# Patient Record
Sex: Female | Born: 1935 | Race: White | Hispanic: No | Smoking: Former smoker
Health system: Southern US, Community
[De-identification: ages and names within clinical notes are randomized; demographics above are authoritative.]

## PROBLEM LIST (undated history)

## (undated) DIAGNOSIS — C801 Malignant (primary) neoplasm, unspecified: Secondary | ICD-10-CM

## (undated) DIAGNOSIS — E079 Disorder of thyroid, unspecified: Secondary | ICD-10-CM

## (undated) DIAGNOSIS — T4145XA Adverse effect of unspecified anesthetic, initial encounter: Secondary | ICD-10-CM

## (undated) DIAGNOSIS — K449 Diaphragmatic hernia without obstruction or gangrene: Secondary | ICD-10-CM

## (undated) DIAGNOSIS — E039 Hypothyroidism, unspecified: Secondary | ICD-10-CM

## (undated) DIAGNOSIS — K219 Gastro-esophageal reflux disease without esophagitis: Secondary | ICD-10-CM

## (undated) DIAGNOSIS — F32A Depression, unspecified: Secondary | ICD-10-CM

## (undated) DIAGNOSIS — H9190 Unspecified hearing loss, unspecified ear: Secondary | ICD-10-CM

## (undated) DIAGNOSIS — I499 Cardiac arrhythmia, unspecified: Secondary | ICD-10-CM

## (undated) DIAGNOSIS — I10 Essential (primary) hypertension: Secondary | ICD-10-CM

## (undated) DIAGNOSIS — T8859XA Other complications of anesthesia, initial encounter: Secondary | ICD-10-CM

## (undated) DIAGNOSIS — Z9889 Other specified postprocedural states: Secondary | ICD-10-CM

## (undated) DIAGNOSIS — R112 Nausea with vomiting, unspecified: Secondary | ICD-10-CM

## (undated) DIAGNOSIS — F329 Major depressive disorder, single episode, unspecified: Secondary | ICD-10-CM

## (undated) HISTORY — DX: Major depressive disorder, single episode, unspecified: F32.9

## (undated) HISTORY — DX: Essential (primary) hypertension: I10

## (undated) HISTORY — DX: Depression, unspecified: F32.A

## (undated) HISTORY — DX: Disorder of thyroid, unspecified: E07.9

## (undated) HISTORY — PX: BACK SURGERY: SHX140

## (undated) HISTORY — PX: BREAST CYST ASPIRATION: SHX578

## (undated) HISTORY — PX: CHOLECYSTECTOMY: SHX55

## (undated) HISTORY — PX: COLON SURGERY: SHX602

## (undated) HISTORY — DX: Diaphragmatic hernia without obstruction or gangrene: K44.9

## (undated) HISTORY — PX: APPENDECTOMY: SHX54

---

## 2005-02-22 ENCOUNTER — Ambulatory Visit: Payer: Self-pay | Admitting: Internal Medicine

## 2006-02-27 ENCOUNTER — Ambulatory Visit: Payer: Self-pay | Admitting: Internal Medicine

## 2006-03-02 ENCOUNTER — Ambulatory Visit: Payer: Self-pay | Admitting: Internal Medicine

## 2006-06-28 ENCOUNTER — Ambulatory Visit: Payer: Self-pay | Admitting: Internal Medicine

## 2006-08-15 ENCOUNTER — Inpatient Hospital Stay (HOSPITAL_COMMUNITY): Admission: RE | Admit: 2006-08-15 | Discharge: 2006-08-17 | Payer: Self-pay | Admitting: Neurosurgery

## 2007-03-07 ENCOUNTER — Ambulatory Visit: Payer: Self-pay | Admitting: Internal Medicine

## 2008-03-19 ENCOUNTER — Ambulatory Visit: Payer: Self-pay | Admitting: Internal Medicine

## 2009-03-23 ENCOUNTER — Ambulatory Visit: Payer: Self-pay | Admitting: Internal Medicine

## 2009-07-08 ENCOUNTER — Encounter: Payer: Self-pay | Admitting: Internal Medicine

## 2009-07-14 ENCOUNTER — Encounter: Payer: Self-pay | Admitting: Internal Medicine

## 2009-09-18 ENCOUNTER — Encounter: Payer: Self-pay | Admitting: Orthopedic Surgery

## 2009-10-14 ENCOUNTER — Encounter: Payer: Self-pay | Admitting: Orthopedic Surgery

## 2009-11-13 ENCOUNTER — Ambulatory Visit: Payer: Self-pay | Admitting: Orthopedic Surgery

## 2010-04-01 ENCOUNTER — Ambulatory Visit: Payer: Self-pay | Admitting: Internal Medicine

## 2011-05-03 ENCOUNTER — Ambulatory Visit: Payer: Self-pay | Admitting: Internal Medicine

## 2011-08-02 ENCOUNTER — Ambulatory Visit: Payer: Self-pay | Admitting: Unknown Physician Specialty

## 2011-08-02 LAB — URINALYSIS, COMPLETE
Bilirubin,UR: NEGATIVE
Blood: NEGATIVE
Ketone: NEGATIVE
Ph: 6 (ref 4.5–8.0)
Protein: NEGATIVE
Specific Gravity: 1.018 (ref 1.003–1.030)
Squamous Epithelial: 9

## 2011-08-02 LAB — BASIC METABOLIC PANEL
Chloride: 101 mmol/L (ref 98–107)
Co2: 25 mmol/L (ref 21–32)
Creatinine: 0.92 mg/dL (ref 0.60–1.30)
Sodium: 139 mmol/L (ref 136–145)

## 2011-08-02 LAB — CBC
HGB: 12.6 g/dL (ref 12.0–16.0)
RBC: 3.87 10*6/uL (ref 3.80–5.20)
WBC: 5.2 10*3/uL (ref 3.6–11.0)

## 2011-08-09 ENCOUNTER — Ambulatory Visit: Payer: Self-pay | Admitting: Unknown Physician Specialty

## 2011-10-14 LAB — COMPREHENSIVE METABOLIC PANEL
Albumin: 3.9 g/dL (ref 3.4–5.0)
Alkaline Phosphatase: 65 U/L (ref 50–136)
Anion Gap: 11 (ref 7–16)
Calcium, Total: 9.6 mg/dL (ref 8.5–10.1)
Creatinine: 0.95 mg/dL (ref 0.60–1.30)
Osmolality: 272 (ref 275–301)
Potassium: 4 mmol/L (ref 3.5–5.1)
SGOT(AST): 26 U/L (ref 15–37)
SGPT (ALT): 20 U/L
Sodium: 135 mmol/L — ABNORMAL LOW (ref 136–145)
Total Protein: 7.2 g/dL (ref 6.4–8.2)

## 2011-10-14 LAB — CBC
MCH: 30.4 pg (ref 26.0–34.0)
MCHC: 32.7 g/dL (ref 32.0–36.0)
RDW: 13.8 % (ref 11.5–14.5)

## 2011-10-14 LAB — TROPONIN I: Troponin-I: <0.02 ng/mL

## 2011-10-15 ENCOUNTER — Inpatient Hospital Stay: Payer: Self-pay | Admitting: Surgery

## 2011-10-15 LAB — URINALYSIS, COMPLETE
Bacteria: NONE SEEN
Leukocyte Esterase: NEGATIVE
Nitrite: NEGATIVE

## 2011-10-15 LAB — AMYLASE: Amylase: 62 U/L (ref 25–115)

## 2011-10-15 LAB — PROTIME-INR: Prothrombin Time: 12.2 secs (ref 11.5–14.7)

## 2011-10-16 LAB — CBC WITH DIFFERENTIAL/PLATELET
Basophil #: 0 10*3/uL (ref 0.0–0.1)
Basophil %: 0.5 %
Basophil: 1 %
Lymphocyte #: 1 10*3/uL (ref 1.0–3.6)
Lymphocyte %: 19.6 %
Lymphocytes: 21 %
MCH: 30.7 pg (ref 26.0–34.0)
MCHC: 32.7 g/dL (ref 32.0–36.0)
Monocyte #: 0.8 x10 3/mm (ref 0.2–0.9)
Monocyte %: 14.7 %
Monocytes: 15 %
Neutrophil #: 3.4 10*3/uL (ref 1.4–6.5)
RBC: 3.88 10*6/uL (ref 3.80–5.20)
RDW: 13.6 % (ref 11.5–14.5)
Variant Lymphocyte - H1-Rlymph: 4 %

## 2011-10-16 LAB — COMPREHENSIVE METABOLIC PANEL
Albumin: 2.9 g/dL — ABNORMAL LOW (ref 3.4–5.0)
Calcium, Total: 8.2 mg/dL — ABNORMAL LOW (ref 8.5–10.1)
Chloride: 98 mmol/L (ref 98–107)
Co2: 23 mmol/L (ref 21–32)
Creatinine: 0.85 mg/dL (ref 0.60–1.30)
EGFR (Non-African Amer.): >60
Glucose: 134 mg/dL — ABNORMAL HIGH (ref 65–99)
Osmolality: 261 (ref 275–301)
SGPT (ALT): 14 U/L
Total Protein: 5.7 g/dL — ABNORMAL LOW (ref 6.4–8.2)

## 2011-10-17 LAB — BASIC METABOLIC PANEL
Anion Gap: 11 (ref 7–16)
BUN: 14 mg/dL (ref 7–18)
Calcium, Total: 8.6 mg/dL (ref 8.5–10.1)
Creatinine: 0.8 mg/dL (ref 0.60–1.30)
Glucose: 105 mg/dL — ABNORMAL HIGH (ref 65–99)
Sodium: 134 mmol/L — ABNORMAL LOW (ref 136–145)

## 2011-10-18 HISTORY — PX: EXPLORATORY LAPAROTOMY: SUR591

## 2011-10-18 LAB — CBC WITH DIFFERENTIAL/PLATELET
Basophil #: 0 10*3/uL (ref 0.0–0.1)
Basophil %: 0.2 %
Eosinophil #: 0 10*3/uL (ref 0.0–0.7)
Eosinophil %: 0.1 %
HCT: 34.2 % — ABNORMAL LOW (ref 35.0–47.0)
HGB: 11.4 g/dL — ABNORMAL LOW (ref 12.0–16.0)
Lymphocyte %: 7.6 %
MCHC: 33.3 g/dL (ref 32.0–36.0)
Monocyte #: 0.7 x10 3/mm (ref 0.2–0.9)
Platelet: 263 10*3/uL (ref 150–440)
RBC: 3.66 10*6/uL — ABNORMAL LOW (ref 3.80–5.20)
RDW: 13.3 % (ref 11.5–14.5)

## 2011-10-18 LAB — COMPREHENSIVE METABOLIC PANEL
Calcium, Total: 8.1 mg/dL — ABNORMAL LOW (ref 8.5–10.1)
Glucose: 125 mg/dL — ABNORMAL HIGH (ref 65–99)
Osmolality: 272 (ref 275–301)
Potassium: 3.8 mmol/L (ref 3.5–5.1)
SGPT (ALT): 16 U/L
Sodium: 135 mmol/L — ABNORMAL LOW (ref 136–145)
Total Protein: 6.1 g/dL — ABNORMAL LOW (ref 6.4–8.2)

## 2011-10-22 LAB — CBC WITH DIFFERENTIAL/PLATELET
Basophil #: 0 10*3/uL (ref 0.0–0.1)
Basophil %: 0.3 %
Eosinophil #: 0.2 10*3/uL (ref 0.0–0.7)
HCT: 29.4 % — ABNORMAL LOW (ref 35.0–47.0)
HGB: 9.6 g/dL — ABNORMAL LOW (ref 12.0–16.0)
Lymphocyte %: 14 %
MCH: 30.1 pg (ref 26.0–34.0)
MCHC: 32.6 g/dL (ref 32.0–36.0)
MCV: 93 fL (ref 80–100)
Monocyte %: 11 %
Neutrophil %: 72.3 %
Platelet: 297 10*3/uL (ref 150–440)
RBC: 3.18 10*6/uL — ABNORMAL LOW (ref 3.80–5.20)

## 2011-10-22 LAB — BASIC METABOLIC PANEL
BUN: 4 mg/dL — ABNORMAL LOW (ref 7–18)
EGFR (African American): >60
Glucose: 104 mg/dL — ABNORMAL HIGH (ref 65–99)
Potassium: 3.6 mmol/L (ref 3.5–5.1)
Sodium: 135 mmol/L — ABNORMAL LOW (ref 136–145)

## 2012-05-03 ENCOUNTER — Ambulatory Visit: Payer: Self-pay | Admitting: Internal Medicine

## 2012-10-01 ENCOUNTER — Other Ambulatory Visit: Payer: Self-pay | Admitting: Urgent Care

## 2012-10-01 LAB — CBC WITH DIFFERENTIAL/PLATELET
Basophil #: 0.1 10*3/uL (ref 0.0–0.1)
Eosinophil #: 0.3 10*3/uL (ref 0.0–0.7)
HCT: 37.8 % (ref 35.0–47.0)
HGB: 12.8 g/dL (ref 12.0–16.0)
Lymphocyte %: 30.9 %
Monocyte #: 0.7 x10 3/mm (ref 0.2–0.9)
Neutrophil %: 51.6 %
RBC: 4.1 10*6/uL (ref 3.80–5.20)

## 2012-10-01 LAB — HEPATIC FUNCTION PANEL A (ARMC)
Albumin: 3.9 g/dL (ref 3.4–5.0)
Alkaline Phosphatase: 79 U/L (ref 50–136)
Bilirubin, Direct: 0.1 mg/dL (ref 0.00–0.20)
Bilirubin,Total: 0.4 mg/dL (ref 0.2–1.0)
SGOT(AST): 25 U/L (ref 15–37)
SGPT (ALT): 19 U/L (ref 12–78)

## 2012-10-01 LAB — LIPASE, BLOOD: Lipase: 176 U/L (ref 73–393)

## 2012-10-18 ENCOUNTER — Ambulatory Visit: Payer: Self-pay | Admitting: Gastroenterology

## 2012-10-19 LAB — PATHOLOGY REPORT

## 2013-02-11 DIAGNOSIS — C44111 Basal cell carcinoma of skin of unspecified eyelid, including canthus: Secondary | ICD-10-CM | POA: Insufficient documentation

## 2013-02-12 ENCOUNTER — Ambulatory Visit: Payer: Self-pay

## 2013-02-13 ENCOUNTER — Emergency Department: Payer: Self-pay | Admitting: Emergency Medicine

## 2013-02-13 LAB — CBC
HCT: 38.6 % (ref 35.0–47.0)
HGB: 13.1 g/dL (ref 12.0–16.0)
MCHC: 34 g/dL (ref 32.0–36.0)
Platelet: 261 10*3/uL (ref 150–440)
RBC: 4.29 10*6/uL (ref 3.80–5.20)
RDW: 15.1 % — ABNORMAL HIGH (ref 11.5–14.5)

## 2013-02-13 LAB — URINALYSIS, COMPLETE
Bacteria: NONE SEEN
Bilirubin,UR: NEGATIVE
Glucose,UR: NEGATIVE mg/dL (ref 0–75)
Leukocyte Esterase: NEGATIVE
Protein: NEGATIVE
RBC,UR: NONE SEEN /HPF (ref 0–5)
Squamous Epithelial: 1
WBC UR: 2 /HPF (ref 0–5)

## 2013-02-13 LAB — COMPREHENSIVE METABOLIC PANEL
Albumin: 3.8 g/dL (ref 3.4–5.0)
Alkaline Phosphatase: 96 U/L (ref 50–136)
Anion Gap: 11 (ref 7–16)
BUN: 14 mg/dL (ref 7–18)
Bilirubin,Total: 0.5 mg/dL (ref 0.2–1.0)
Calcium, Total: 9.8 mg/dL (ref 8.5–10.1)
Chloride: 103 mmol/L (ref 98–107)
Creatinine: 1.09 mg/dL (ref 0.60–1.30)
EGFR (African American): 58 — ABNORMAL LOW
EGFR (Non-African Amer.): 50 — ABNORMAL LOW
Glucose: 93 mg/dL (ref 65–99)
Potassium: 4 mmol/L (ref 3.5–5.1)
SGOT(AST): 32 U/L (ref 15–37)
SGPT (ALT): 28 U/L (ref 12–78)
Sodium: 136 mmol/L (ref 136–145)
Total Protein: 7.2 g/dL (ref 6.4–8.2)

## 2013-02-13 LAB — LIPASE, BLOOD: Lipase: 122 U/L (ref 73–393)

## 2013-02-13 LAB — TROPONIN I: Troponin-I: <0.02 ng/mL

## 2013-05-06 ENCOUNTER — Ambulatory Visit: Payer: Self-pay | Admitting: Internal Medicine

## 2013-06-04 ENCOUNTER — Ambulatory Visit: Payer: Self-pay | Admitting: Surgery

## 2014-05-21 ENCOUNTER — Ambulatory Visit: Payer: Self-pay | Admitting: Internal Medicine

## 2014-09-07 NOTE — Op Note (Signed)
PATIENT NAME:  Tracie Fisher, Tracie Fisher MR#:  [id number] DATE OF BIRTH:  1935-10-23  DATE OF PROCEDURE:  08/09/2011  PREOPERATIVE DIAGNOSIS: Recalcitrant trochanteric bursitis, right hip.   POSTOPERATIVE DIAGNOSIS:  Recalcitrant trochanteric bursitis, right hip.   PROCEDURES:  1. Arthroscopic excision of trochanteric bursa, right hip.  2. Arthroscopic release of tensor fascia lata.   SURGEON: Alysia Penna. Khyrie Masi, M.D.   ASSISTANT: None.   ANESTHESIA: General.   ESTIMATED BLOOD LOSS: Negligible.   COMPLICATIONS: None.   BRIEF CLINICAL NOTE AND PATHOLOGY: The patient had recalcitrant trochanteric bursitis. It did respond to injections temporarily. The options, risks, and benefits were discussed, and she elected to proceed with operative intervention. At the time of the procedure, there was marked thickening and scarring in the bursa. There was also marked tension of the tensor fascia lata.   DESCRIPTION OF PROCEDURE: Preop antibiotics, adequate general anesthesia, left lateral position with a beanbag used. Routine prepping and draping. Appropriate timeout was called.   The greater trochanter was palpated and apex was identified with a spinal needle. Incisions were made proximal and distal to this. The area was injected with Marcaine with epinephrine. The arthroscope was then introduced. The bursa was removed. There was marked irritation of the tensor fascia lata. The bursa was removed with the shaver and the ArthroWand. The tensor fascia lata was then separated carefully with the ArthroWand, allowing it to separate nicely on both sides of the greater trochanter. This was extended proximally and distally. The hip was rotated with no further pressure. The scope was removed. The area was expressed of fluid. The portals were closed with nylon. Aquasol dressings were applied. The patient was awakened and taken to the PAC-U having tolerated the procedure well. Prior to dressing application, the area was again  injected with Marcaine with epinephrine. The patient was taken to the postanesthesia care unit having tolerated the procedure well.  ____________________________ Alysia Penna. Mauri Pole, MD jcc:cbb D: 08/10/2011 05:55:00 ET T: 08/10/2011 09:53:46 ET JOB#: [id number]  cc: Alysia Penna. Mauri Pole, MD, <Dictator> Alysia Penna Laneya Gasaway MD ELECTRONICALLY SIGNED 08/10/2011 21:18

## 2014-09-07 NOTE — H&P (Signed)
PATIENT NAME:  Tracie Fisher, FERCH MR#:  [id number] DATE OF BIRTH:  03/26/1936  DATE OF ADMISSION:  10/15/2011  HISTORY OF PRESENT ILLNESS: Ms. Conti is a 79 year old white female who has a 12 hour history of generalized abdominal pain followed by vomiting. She has vomited consistently for the last 12 hours and continued to vomit in the Emergency Department at [facility]. She also complains of abdominal bloating. This began yesterday afternoon and she had a normal bowel movement yesterday morning (about 20 hours ago). She denies diarrhea, blood in her vomit, blood in her stool, and fever.   Dr. Bary Castilla noted intra-abdominal adhesions from the omentum to the anterior abdominal wall during a laparoscopic cholecystectomy that he performed in December 2001.   PAST SURGICAL HISTORY: 1. Hysterectomy. 2. Appendectomy. 3. Laparoscopic cholecystectomy. 4. Recent right hip bursectomy. 5. Back operations with significant hardware placement.   PAST MEDICAL HISTORY: 1. Hypertension.  2. Hypothyroidism.   MEDICATIONS:  1. Alprazolam 1 mg at bedtime.  2. Metoprolol 50 mg, 1/2 tablet b.i.d.  3. Synthroid 50 mcg q.a.m.  4. Vitamin B12 500 mcg q.a.m.   ALLERGIES: Aspirin, penicillin, tape.  REVIEW OF SYSTEMS: Negative for 10 systems except as mentioned in the history of present illness above. She has no genitourinary symptoms, no hematemesis, no melena, and no fever.   FAMILY HISTORY: Noncontributory.   SOCIAL HISTORY: The patient lives alone. She is divorced. She smoked 3 packs of cigarettes per day but quit 20 years ago. She has an occasional beer. She never worked outside the home.   PHYSICAL EXAMINATION:  GENERAL: An elderly, very quiet, still lady lying in the Emergency Room stretcher.   VITAL SIGNS: Height 5 feet 2 inches, weight 111 pounds, BMI 20.3. Temperature 96.7 degrees Fahrenheit, pulse 70, respirations 18, blood pressure 225/103, oxygen saturation 97% on room air. Repeat blood pressure was  193/83 and 170/76 and 197/83.   HEENT: Pupils equally round and reactive to light. Extraocular movements intact. Sclerae nonicteric. Oropharynx clear. Mucous membranes moist. Hearing intact to voice.   NECK: Supple with no thyromegaly, jugular venous distention, or tracheal deviation.   HEART: Regular rate and rhythm with no murmurs or rubs.   LUNGS: Clear to auscultation with normal respiratory effort bilaterally.   ABDOMEN: Distended but not tympanitic. It is nontender and patient has a small umbilical scar and Pfannenstiel incision scar.   EXTREMITIES: No edema with normal capillary refill bilaterally.   NEUROLOGIC: Motor and sensation are grossly intact.   PSYCHIATRIC: Alert and oriented x4 with appropriate affect.   LABORATORY, DIAGNOSTIC AND RADIOLOGICAL DATA: White blood cell count 11.0, hemoglobin 12, hematocrit 37%, platelet count 333,000. Electrolytes normal. Hepatic profile normal. Urinalysis normal. CT scan of the abdomen and pelvis with IV and oral contrast shows dilated small bowel loops in the abdomen and pelvis with the distal small bowel being completely decompressed. There is a small amount of free fluid in the pelvis. These dilated small bowel loops are not markedly dilated according to my review. There is stool present in the entire right colon, transverse colon and descending colon. This is not dilated but is associated with some gas within the colon. The rectosigmoid is decompressed.   ASSESSMENT: Partial small bowel obstruction versus some other cause for vomiting.   PLAN: Admit to hospital for IV fluid hydration, nasogastric suction, and observation.   ____________________________ Consuela Mimes, MD wfm:cms D: 10/15/2011 05:08:55 ET T: 10/15/2011 09:40:27 ET JOB#: [id number]  cc: Consuela Mimes, MD, <Dictator> Darrick Penna  Willean Schurman MD ELECTRONICALLY SIGNED 10/17/2011 21:45

## 2014-09-07 NOTE — Discharge Summary (Signed)
PATIENT NAME:  Tracie Fisher, Tracie Fisher MR#:  [id number] DATE OF BIRTH:  30-Jan-1936  DATE OF ADMISSION:  10/15/2011 DATE OF DISCHARGE:  10/25/2011  PRINCIPLE DIAGNOSIS: Adhesive small bowel obstruction.   OTHER DIAGNOSES:  1. Hypertension.  2. Hypothyroidism.  3. Status post hysterectomy.  4. Status post appendectomy.  5. Status post laparoscopic cholecystectomy.  PRINCIPLE PROCEDURE PERFORMED DURING THIS ADMISSION: Exploratory laparotomy and extensive enterolysis on 10/18/2011.   HOSPITAL COURSE: [name] was admitted to the hospital and initially treated with nasogastric suction but that came out and she then had some vomiting. She had 4 to 5 little hard balls of constipated bowel movement with Dulcolax suppository and she had lots of stool throughout her colon so she was continued on regimen of enemas and Dulcolax suppositories until the evening of June 4th when she had almost 1000 mL of emesis and bilious nasogastric output. Her abdomen was softer but she essentially had had very little stool with the enemas and Dulcolax suppositories and had passed no flatus. She was taken to the operating room where she underwent the above-mentioned procedure for the above-mentioned findings and postoperatively she had her nasogastric tube removed on the morning of postoperative day three and she continued to progress to discharge on June 11th. She was asked to make an appointment to see me in the office for follow-up and to call in the interim for any problems.   ____________________________ Consuela Mimes, MD wfm:drc D: 11/08/2011 10:16:14 ET T: 11/08/2011 11:29:01 ET JOB#: [id number]  cc: Consuela Mimes, MD, <Dictator>  Consuela Mimes MD ELECTRONICALLY SIGNED 11/11/2011 22:42

## 2014-09-07 NOTE — Op Note (Signed)
PATIENT NAME:  Tracie Fisher, Tracie Fisher MR#:  [id number] DATE OF BIRTH:  1935/08/06  DATE OF PROCEDURE:  10/18/2011  PREOPERATIVE DIAGNOSIS: Small bowel obstruction.   POSTOPERATIVE DIAGNOSIS: Small bowel obstruction, adhesive.   PROCEDURES PERFORMED:  1. Exploratory laparotomy.  2. Extensive enterolysis.   SURGEON: Consuela Mimes, MD  ANESTHESIA: General.   PROCEDURE IN DETAIL: The patient was placed supine on the Operating Room table and prepped and draped in the usual sterile fashion. A lower midline incision was made which was carried superior to the umbilicus and carried down through the subcutaneous tissue and the linea alba with the electrocautery. Upon getting through the linea alba there was immediate transverse colon and omentum. These adhesions were taken down tediously to the anterior abdominal wall and ultimately the peritoneal cavity was entered. All of the omentum was freed up from the pelvis and the transverse colon was reflected superiorly allowing adhesions from loops of small intestine to the anterior abdominal wall, other loops of small intestine, and small bowel mesentery to be lysed with the electrocautery. This was done tediously and there was a particular adhesion in the right lower quadrant that allowed a loop of small intestine to come through it and be obstructed. The obstructed distended probably closed loop of bowel was somewhat purplish in color but was clearly viable. There were no areas of intestine that were nonviable. There are no intestinal injuries. All of these adhesions were taken down and there was one additional adhesion in the mid abdomen near the retroperitoneum that was also problematic. There were some adhesions they were left intact at the terminal ileum between loops of terminal ileum and other loops of terminal ileum. Otherwise, all of the adhesions were taken down to the ligament of Treitz including those to the retroperitoneum and mesenteric base. Once this  had been performed, the small intestine was replaced in its anatomic position taking care to place the jejunum in the left upper quadrant, the mid small bowel in the mid abdomen and the ileum in the right lower quadrant. Three sheets of Seprafilm were placed over the intestine and then the foreshortened previously grossly adherent omentum and transverse colon were draped over top of them. A single piece of Seprafilm was placed between the omentum and transverse colon and the anterior abdominal wall and the linea alba was closed with mass closure technique of running #1 PDS. The subcutaneous tissue was irrigated and the skin was reapproximated with a stapling device and a sterile dressing was applied. Patient tolerated the procedure well. There were no complications.   ____________________________ Consuela Mimes, MD wfm:cms D: 10/18/2011 22:50:45 ET T: 10/19/2011 09:26:58 ET JOB#: [id number]  cc: Consuela Mimes, MD, <Dictator> Consuela Mimes MD ELECTRONICALLY SIGNED 10/19/2011 21:22

## 2015-04-23 ENCOUNTER — Ambulatory Visit
Admission: RE | Admit: 2015-04-23 | Discharge: 2015-04-23 | Disposition: A | Discharge status: Home / Self Care | Payer: Medicare Other | Source: Ambulatory Visit | Attending: Unknown Physician Specialty | Admitting: Unknown Physician Specialty

## 2015-04-23 ENCOUNTER — Other Ambulatory Visit: Payer: Self-pay | Admitting: Unknown Physician Specialty

## 2015-04-23 DIAGNOSIS — R05 Cough: Secondary | ICD-10-CM | POA: Diagnosis not present

## 2015-04-23 DIAGNOSIS — R059 Cough, unspecified: Secondary | ICD-10-CM

## 2015-05-27 ENCOUNTER — Other Ambulatory Visit: Payer: Self-pay | Admitting: Internal Medicine

## 2015-05-27 DIAGNOSIS — Z1231 Encounter for screening mammogram for malignant neoplasm of breast: Secondary | ICD-10-CM

## 2015-06-09 ENCOUNTER — Ambulatory Visit
Admission: RE | Admit: 2015-06-09 | Discharge: 2015-06-09 | Disposition: A | Discharge status: Home / Self Care | Payer: Medicare Other | Source: Ambulatory Visit | Attending: Internal Medicine | Admitting: Internal Medicine

## 2015-06-09 ENCOUNTER — Other Ambulatory Visit: Payer: Self-pay | Admitting: Internal Medicine

## 2015-06-09 DIAGNOSIS — Z1231 Encounter for screening mammogram for malignant neoplasm of breast: Secondary | ICD-10-CM | POA: Insufficient documentation

## 2015-07-28 ENCOUNTER — Ambulatory Visit
Admission: RE | Admit: 2015-07-28 | Discharge: 2015-07-28 | Disposition: A | Discharge status: Home / Self Care | Payer: Medicare Other | Source: Ambulatory Visit | Attending: Internal Medicine | Admitting: Internal Medicine

## 2015-07-28 ENCOUNTER — Other Ambulatory Visit: Payer: Self-pay | Admitting: Internal Medicine

## 2015-07-28 DIAGNOSIS — K449 Diaphragmatic hernia without obstruction or gangrene: Secondary | ICD-10-CM | POA: Diagnosis not present

## 2015-07-28 DIAGNOSIS — R0600 Dyspnea, unspecified: Secondary | ICD-10-CM

## 2015-07-29 DIAGNOSIS — R0602 Shortness of breath: Secondary | ICD-10-CM | POA: Insufficient documentation

## 2015-07-29 DIAGNOSIS — Z87898 Personal history of other specified conditions: Secondary | ICD-10-CM | POA: Insufficient documentation

## 2016-03-23 ENCOUNTER — Inpatient Hospital Stay: Payer: Medicare Other

## 2016-03-23 ENCOUNTER — Encounter: Payer: Self-pay | Admitting: Internal Medicine

## 2016-03-23 ENCOUNTER — Encounter (INDEPENDENT_AMBULATORY_CARE_PROVIDER_SITE_OTHER): Payer: Self-pay

## 2016-03-23 ENCOUNTER — Inpatient Hospital Stay: Payer: Medicare Other | Attending: Internal Medicine | Admitting: Internal Medicine

## 2016-03-23 DIAGNOSIS — K5730 Diverticulosis of large intestine without perforation or abscess without bleeding: Secondary | ICD-10-CM | POA: Insufficient documentation

## 2016-03-23 DIAGNOSIS — D500 Iron deficiency anemia secondary to blood loss (chronic): Secondary | ICD-10-CM | POA: Diagnosis present

## 2016-03-23 DIAGNOSIS — Z803 Family history of malignant neoplasm of breast: Secondary | ICD-10-CM | POA: Diagnosis not present

## 2016-03-23 DIAGNOSIS — R42 Dizziness and giddiness: Secondary | ICD-10-CM | POA: Diagnosis not present

## 2016-03-23 DIAGNOSIS — K449 Diaphragmatic hernia without obstruction or gangrene: Secondary | ICD-10-CM | POA: Insufficient documentation

## 2016-03-23 DIAGNOSIS — Z8041 Family history of malignant neoplasm of ovary: Secondary | ICD-10-CM | POA: Diagnosis not present

## 2016-03-23 DIAGNOSIS — R1084 Generalized abdominal pain: Secondary | ICD-10-CM | POA: Insufficient documentation

## 2016-03-23 DIAGNOSIS — D509 Iron deficiency anemia, unspecified: Secondary | ICD-10-CM | POA: Insufficient documentation

## 2016-03-23 DIAGNOSIS — R5383 Other fatigue: Secondary | ICD-10-CM | POA: Insufficient documentation

## 2016-03-23 DIAGNOSIS — F329 Major depressive disorder, single episode, unspecified: Secondary | ICD-10-CM | POA: Diagnosis not present

## 2016-03-23 DIAGNOSIS — E079 Disorder of thyroid, unspecified: Secondary | ICD-10-CM

## 2016-03-23 DIAGNOSIS — I10 Essential (primary) hypertension: Secondary | ICD-10-CM | POA: Diagnosis not present

## 2016-03-23 DIAGNOSIS — M542 Cervicalgia: Secondary | ICD-10-CM | POA: Insufficient documentation

## 2016-03-23 DIAGNOSIS — Z79899 Other long term (current) drug therapy: Secondary | ICD-10-CM | POA: Insufficient documentation

## 2016-03-23 DIAGNOSIS — Z87891 Personal history of nicotine dependence: Secondary | ICD-10-CM | POA: Insufficient documentation

## 2016-03-23 DIAGNOSIS — R531 Weakness: Secondary | ICD-10-CM | POA: Diagnosis not present

## 2016-03-23 DIAGNOSIS — R1013 Epigastric pain: Secondary | ICD-10-CM | POA: Diagnosis not present

## 2016-03-23 DIAGNOSIS — R1010 Upper abdominal pain, unspecified: Secondary | ICD-10-CM | POA: Diagnosis not present

## 2016-03-23 DIAGNOSIS — K921 Melena: Secondary | ICD-10-CM | POA: Insufficient documentation

## 2016-03-23 LAB — CBC WITH DIFFERENTIAL/PLATELET
BASOS ABS: 0.1 10*3/uL (ref 0–0.1)
BASOS PCT: 1 %
EOS ABS: 0.2 10*3/uL (ref 0–0.7)
EOS PCT: 3 %
HCT: 26.6 % — ABNORMAL LOW (ref 35.0–47.0)
Hemoglobin: 8 g/dL — ABNORMAL LOW (ref 12.0–16.0)
Lymphocytes Relative: 45 %
Lymphs Abs: 3.3 10*3/uL (ref 1.0–3.6)
MCH: 21.2 pg — ABNORMAL LOW (ref 26.0–34.0)
MCHC: 30 g/dL — AB (ref 32.0–36.0)
MCV: 70.6 fL — ABNORMAL LOW (ref 80.0–100.0)
MONO ABS: 0.7 10*3/uL (ref 0.2–0.9)
MONOS PCT: 9 %
NEUTROS ABS: 3.1 10*3/uL (ref 1.4–6.5)
Neutrophils Relative %: 42 %
PLATELETS: 437 10*3/uL (ref 150–440)
RBC: 3.77 MIL/uL — ABNORMAL LOW (ref 3.80–5.20)
RDW: 19.2 % — AB (ref 11.5–14.5)
WBC: 7.4 10*3/uL (ref 3.6–11.0)

## 2016-03-23 LAB — IRON AND TIBC
IRON: 12 ug/dL — AB (ref 28–170)
SATURATION RATIOS: 2 % — AB (ref 10.4–31.8)
TIBC: 588 ug/dL — ABNORMAL HIGH (ref 250–450)
UIBC: 576 ug/dL

## 2016-03-23 LAB — FERRITIN: FERRITIN: 4 ng/mL — AB (ref 11–307)

## 2016-03-23 MED ORDER — FERROUS SULFATE 325 (65 FE) MG PO TBEC: 325.0000 mg | DELAYED_RELEASE_TABLET | Freq: Three times a day (TID) | ORAL | 3 refills | Status: DC

## 2016-03-23 NOTE — Progress Notes (Addendum)
[place]  [phone] [phone]  ID: Tracie Fisher OB: [id number]  MR#: [id number]  [id number]  Patient Care Team: Albina Billet, MD as PCP - General (Internal Medicine)  CHIEF COMPLAINT: New Evaluation and Anemia    INTERVAL HISTORY: This is a 80 yr old lady who was noted to have a Hgb of 7.1 by her PCP. She has been progressively more tired, and had been feelign lightheaded. She was seen by cardiology and was noted to have no identifiable cardiac causes to explain her symptoms. She den\es any bleeding, she has had long standing dyspeptic symptoms. No loss of weight, she eats small  Frequent meals.  REVIEW OF SYSTEMS:   Review of Systems  Constitutional: Positive for malaise/fatigue. Negative for chills, diaphoresis, fever and weight loss.  HENT: Positive for hearing loss. Negative for congestion, ear discharge, ear pain, sinus pain and tinnitus.   Gastrointestinal: Positive for abdominal pain and heartburn. Negative for blood in stool, constipation, diarrhea and melena.  Genitourinary: Negative for dysuria, flank pain, frequency, hematuria and urgency.  Musculoskeletal: Positive for neck pain. Negative for myalgias.  Skin: Negative for itching and rash.  Neurological: Positive for weakness. Negative for dizziness, tingling, tremors, sensory change, speech change, focal weakness, seizures, loss of consciousness and headaches.  Endo/Heme/Allergies: Negative for environmental allergies and polydipsia. Does not bruise/bleed easily.  Psychiatric/Behavioral: Negative for depression, hallucinations, memory loss, substance abuse and suicidal ideas. The patient is not nervous/anxious and does not have insomnia.     As per HPI. Otherwise, a complete review of systems is negative.  PAST MEDICAL HISTORY: Past Medical History:  Diagnosis Date  . Depression   . Hypertension   . Thyroid disease     PAST SURGICAL HISTORY: Past Surgical  History:  Procedure Laterality Date  . BREAST CYST ASPIRATION Left "years ago"   Intetstinal resection LUMBAR DISC FUSION,  CERVICAL spine surgery Bladder tuck with mesh  FAMILY HISTORY: Family History  Problem Relation Age of Onset  . Breast cancer Sister 44  . Heart attack Brother   . Heart disease Father   Ovarian cancer sister No colon cancer in family  ADVANCED DIRECTIVES (Y/N):  N  HEALTH MAINTENANCE: Social History  Substance Use Topics  . Smoking status: Former Smoker    Years: 20.00  . Smokeless tobacco: Never Used  . Alcohol use Not on file     Allergies  Allergen Reactions  . Aspirin Itching  . Penicillin G Rash    Current Outpatient Prescriptions  Medication Sig Dispense Refill  . ALPRAZolam (XANAX) 1 MG tablet Take by mouth.    . carvedilol (COREG) 6.25 MG tablet Take by mouth.    . cyanocobalamin (V-R VITAMIN B-12) 500 MCG tablet Take by mouth.    . DULoxetine (CYMBALTA) 30 MG capsule Take by mouth.    . levothyroxine (SYNTHROID, LEVOTHROID) 50 MCG tablet Take by mouth.    . losartan (COZAAR) 50 MG tablet Take by mouth.    . triamcinolone ointment (KENALOG) 0.1 % Apply topically.    . ferrous sulfate 325 (65 FE) MG EC tablet Take 1 tablet (325 mg total) by mouth 3 (three) times daily with meals. 60 tablet 3   No current facility-administered medications for this visit.     OBJECTIVE: Vitals:   03/23/16 1342  BP: (!) 187/75  Pulse: 61  Resp: 18  Temp: 97.1 F (36.2 C)     Body mass index is 23.7 kg/m.   1.67 meters squared  ECOG FS:1 - Symptomatic but completely ambulatory  Physical Exam  Constitutional: She is oriented to person, place, and time. She appears well-developed and well-nourished.  HENT:  Head: Normocephalic and atraumatic.  Eyes: EOM are normal. Pupils are equal, round, and reactive to light.  Neck: Normal range of motion. Neck supple. No tracheal deviation present. No thyromegaly present.  Cardiovascular: Normal rate.     Pulmonary/Chest: Effort normal and breath sounds normal.  Abdominal: Soft. She exhibits no distension. There is no tenderness. There is no guarding.  Musculoskeletal: Normal range of motion. She exhibits no edema or deformity.  Lymphadenopathy:    She has no cervical adenopathy.  Neurological: She is alert and oriented to person, place, and time. No cranial nerve deficit.  Skin: No rash noted. There is pallor.  Psychiatric: She has a normal mood and affect. Her behavior is normal.       LAB RESULTS:  Lab Results  Component Value Date   NA 136 02/13/2013   K 4.0 02/13/2013   CL 103 02/13/2013   CO2 22 02/13/2013   GLUCOSE 93 02/13/2013   BUN 14 02/13/2013   CREATININE 1.09 02/13/2013   CALCIUM 9.8 02/13/2013   PROT 7.2 02/13/2013   ALBUMIN 3.8 02/13/2013   AST 32 02/13/2013   ALT 28 02/13/2013   ALKPHOS 96 02/13/2013   BILITOT 0.5 02/13/2013   GFRNONAA 50 (L) 02/13/2013   GFRAA 58 (L) 02/13/2013    Lab Results  Component Value Date   WBC 7.4 03/23/2016   NEUTROABS 3.1 03/23/2016   HGB 8.0 (L) 03/23/2016   HCT 26.6 (L) 03/23/2016   MCV 70.6 (L) 03/23/2016   PLT 437 03/23/2016          LABORATORY DATA:  CBC from  10//2017 showed a hemoglobin of 7.1 with an MCV of 73 normal Wbc  White cell differential appeared to be normal. 123456 was 629,folic acid was greater than 20  Liver function tests from earlier labs April 017 was reported normal.      PLAN:  Iron deficiency aneia. Source of bood loss not clear. She may also have a malabsoprtion of Iron She gives a history of colonic resection a few years ago reports of that are not evaluable she also says she had a colonoscopy however I cannot see any reports on the Epic.  I did see a surgical path report from 2014 that showed chronic gastritis and a benign polyp reported. I will refer her to GI for a complete workup.  She is unsure if she can tolerate oral iron nevertheless we'll try with the iron sulfate 325 mg  once daily and increase it to 2 and 3 eventually as she tolerates. If she is unable to tolerate have asked her to give [place] a call back when I would be able to arrange for IV Venofer infusions. I have discussed the potential side effects of Iv iron.  She agrees to the plan.Iron panel has been requested today.  We'll search  for records of colonoscopy and the abdominal surgery.  Chronic persistent upper abdominal pain and dyspepsia: She has severe dyspepsia most likely as a result of the large hiatal hernia that was noted on a previous CT scan. Will request a CT scan of the abdomen pelvis the last scan from 2016 showed various nonspecific abnormalities but given her persistent pain and now the iron deficiency, I would like to make sure there is no intraluminal pathology to suspect bleeding.   Patient expressed understanding and  was in agreement with this plan. She also understands that She can call clinic at any time with any questions, concerns, or complaints.  Orders Placed This Encounter  Procedures  . CT ABDOMEN PELVIS W CONTRAST    Standing Status:   Future    Standing Expiration Date:   03/23/2017    Order Specific Question:   Reason for Exam (SYMPTOM  OR DIAGNOSIS REQUIRED)    Answer:   ABDOMINAL PAIN, anemia, h/o intestinal resrction, hiatal hernia    Order Specific Question:   Preferred imaging location?    Answer:   [facility]  . Iron and TIBC    Standing Status:   Future    Number of Occurrences:   1    Standing Expiration Date:   03/23/2017  . Ferritin    Standing Status:   Future    Number of Occurrences:   1    Standing Expiration Date:   03/23/2017  . CBC with Differential    Standing Status:   Future    Number of Occurrences:   1    Standing Expiration Date:   03/23/2017  . Ambulatory referral to Gastroenterology    Referral Priority:   Routine    Referral Type:   Consultation    Referral Reason:   Specialty Services Required    Number of Visits Requested:   1     Return in about 2 weeks (around 04/06/2016). No matching staging information was found for the patient.   This note was generated in part with voice recognition software and I apologize for any typographical errors that were not detected and corrected.    Creola Corn, MD   03/28/2016 10:02 AM   ADDENDUM: 03/28/2016 [id number] I called Tracie Fisher today by phone and she reports that her stomach has been burning continuously since  she began oral iron. She can no longer take it. Therefore, I have ordered IV Venofer to start today. She understands the side effects and benefits. As we discussed at her last visit.

## 2016-03-23 NOTE — Progress Notes (Signed)
Patient was evaluated by PCP for weakness with syncope episodes and her hemoglobin was low on labs.

## 2016-03-24 ENCOUNTER — Telehealth: Payer: Self-pay | Admitting: *Deleted

## 2016-03-24 MED ORDER — FERROUS SULFATE 325 (65 FE) MG PO TBEC: 325.0000 mg | DELAYED_RELEASE_TABLET | Freq: Three times a day (TID) | ORAL | 3 refills | Status: DC

## 2016-03-24 NOTE — Telephone Encounter (Signed)
Pharmacy did not receive prescription.

## 2016-03-28 ENCOUNTER — Inpatient Hospital Stay: Payer: Medicare Other

## 2016-03-28 VITALS — BP 178/82 | HR 69 | Temp 97.3°F | Resp 20

## 2016-03-28 DIAGNOSIS — D500 Iron deficiency anemia secondary to blood loss (chronic): Secondary | ICD-10-CM | POA: Diagnosis not present

## 2016-03-28 MED ORDER — SODIUM CHLORIDE 0.9 % IV SOLN: 200.0000 mg | Freq: Once | INTRAVENOUS | Status: DC

## 2016-03-28 MED ORDER — IRON SUCROSE 20 MG/ML IV SOLN
200.0000 mg | Freq: Once | INTRAVENOUS | Status: AC
  Administered 2016-03-28: 200 mg via INTRAVENOUS
  Filled 2016-03-28: qty 10

## 2016-03-28 MED ORDER — SODIUM CHLORIDE 0.9 % IV SOLN
Freq: Once | INTRAVENOUS | Status: AC
  Administered 2016-03-28: 14:00:00 via INTRAVENOUS
  Filled 2016-03-28: qty 1000

## 2016-03-30 ENCOUNTER — Inpatient Hospital Stay: Payer: Medicare Other

## 2016-03-30 VITALS — BP 167/89 | HR 67 | Temp 96.1°F | Resp 20

## 2016-03-30 DIAGNOSIS — D500 Iron deficiency anemia secondary to blood loss (chronic): Secondary | ICD-10-CM

## 2016-03-30 MED ORDER — SODIUM CHLORIDE 0.9 % IV SOLN: 200.0000 mg | Freq: Once | INTRAVENOUS | Status: DC

## 2016-03-30 MED ORDER — SODIUM CHLORIDE 0.9 % IV SOLN
Freq: Once | INTRAVENOUS | Status: AC
  Administered 2016-03-30: 20 mL/h via INTRAVENOUS
  Filled 2016-03-30: qty 1000

## 2016-03-30 MED ORDER — IRON SUCROSE 20 MG/ML IV SOLN
200.0000 mg | Freq: Once | INTRAVENOUS | Status: AC
  Administered 2016-03-30: 200 mg via INTRAVENOUS
  Filled 2016-03-30: qty 10

## 2016-03-31 ENCOUNTER — Ambulatory Visit
Admission: RE | Admit: 2016-03-31 | Discharge: 2016-03-31 | Disposition: A | Discharge status: Home / Self Care | Payer: Medicare Other | Source: Ambulatory Visit | Attending: Internal Medicine | Admitting: Internal Medicine

## 2016-03-31 DIAGNOSIS — I700 Atherosclerosis of aorta: Secondary | ICD-10-CM | POA: Diagnosis not present

## 2016-03-31 DIAGNOSIS — R1010 Upper abdominal pain, unspecified: Secondary | ICD-10-CM | POA: Diagnosis present

## 2016-03-31 DIAGNOSIS — D500 Iron deficiency anemia secondary to blood loss (chronic): Secondary | ICD-10-CM | POA: Insufficient documentation

## 2016-03-31 DIAGNOSIS — K5730 Diverticulosis of large intestine without perforation or abscess without bleeding: Secondary | ICD-10-CM | POA: Insufficient documentation

## 2016-03-31 DIAGNOSIS — Z9049 Acquired absence of other specified parts of digestive tract: Secondary | ICD-10-CM | POA: Insufficient documentation

## 2016-03-31 LAB — POCT I-STAT CREATININE: CREATININE: 0.9 mg/dL (ref 0.44–1.00)

## 2016-03-31 MED ORDER — IOPAMIDOL (ISOVUE-300) INJECTION 61%
100.0000 mL | Freq: Once | INTRAVENOUS | Status: AC | PRN
  Administered 2016-03-31: 100 mL via INTRAVENOUS

## 2016-04-01 ENCOUNTER — Inpatient Hospital Stay: Payer: Medicare Other

## 2016-04-04 ENCOUNTER — Inpatient Hospital Stay: Payer: Medicare Other

## 2016-04-06 ENCOUNTER — Inpatient Hospital Stay (HOSPITAL_BASED_OUTPATIENT_CLINIC_OR_DEPARTMENT_OTHER): Payer: Medicare Other | Admitting: Internal Medicine

## 2016-04-06 ENCOUNTER — Inpatient Hospital Stay: Payer: Medicare Other | Admitting: *Deleted

## 2016-04-06 ENCOUNTER — Inpatient Hospital Stay: Payer: Medicare Other

## 2016-04-06 VITALS — BP 194/77 | HR 79 | Temp 98.5°F | Resp 18 | Wt 133.7 lb

## 2016-04-06 VITALS — BP 158/88 | HR 60 | Resp 18

## 2016-04-06 DIAGNOSIS — D500 Iron deficiency anemia secondary to blood loss (chronic): Secondary | ICD-10-CM | POA: Diagnosis not present

## 2016-04-06 LAB — CBC WITH DIFFERENTIAL/PLATELET
BASOS ABS: 0.1 10*3/uL (ref 0–0.1)
Basophils Relative: 2 %
EOS PCT: 3 %
Eosinophils Absolute: 0.2 10*3/uL (ref 0–0.7)
HCT: 29.3 % — ABNORMAL LOW (ref 35.0–47.0)
HEMOGLOBIN: 9 g/dL — AB (ref 12.0–16.0)
LYMPHS ABS: 2.2 10*3/uL (ref 1.0–3.6)
Lymphocytes Relative: 36 %
MCH: 24.4 pg — ABNORMAL LOW (ref 26.0–34.0)
MCHC: 30.7 g/dL — ABNORMAL LOW (ref 32.0–36.0)
MCV: 79.4 fL — AB (ref 80.0–100.0)
Monocytes Absolute: 0.7 10*3/uL (ref 0.2–0.9)
Monocytes Relative: 11 %
NEUTROS PCT: 48 %
Neutro Abs: 3 10*3/uL (ref 1.4–6.5)
PLATELETS: 357 10*3/uL (ref 150–440)
RBC: 3.69 MIL/uL — AB (ref 3.80–5.20)
RDW: 33.2 % — ABNORMAL HIGH (ref 11.5–14.5)
WBC: 6.2 10*3/uL (ref 3.6–11.0)

## 2016-04-06 LAB — FERRITIN: FERRITIN: 113 ng/mL (ref 11–307)

## 2016-04-06 MED ORDER — SODIUM CHLORIDE 0.9 % IV SOLN
Freq: Once | INTRAVENOUS | Status: AC
  Administered 2016-04-06: 15:00:00 via INTRAVENOUS
  Filled 2016-04-06: qty 1000

## 2016-04-06 MED ORDER — SODIUM CHLORIDE 0.9 % IV SOLN: 200.0000 mg | Freq: Once | INTRAVENOUS | Status: DC

## 2016-04-06 MED ORDER — IRON SUCROSE 20 MG/ML IV SOLN
200.0000 mg | Freq: Once | INTRAVENOUS | Status: AC
  Administered 2016-04-06: 200 mg via INTRAVENOUS
  Filled 2016-04-06: qty 10

## 2016-04-06 NOTE — Progress Notes (Signed)
Complains of chronic stomach pain. States is feeling better after 2 doses of venofer.

## 2016-04-12 ENCOUNTER — Other Ambulatory Visit
Admission: RE | Admit: 2016-04-12 | Discharge: 2016-04-12 | Disposition: A | Discharge status: Home / Self Care | Payer: Medicare Other | Source: Ambulatory Visit | Attending: Gastroenterology | Admitting: Gastroenterology

## 2016-04-12 ENCOUNTER — Encounter: Payer: Self-pay | Admitting: Gastroenterology

## 2016-04-12 ENCOUNTER — Other Ambulatory Visit: Payer: Self-pay

## 2016-04-12 ENCOUNTER — Ambulatory Visit (INDEPENDENT_AMBULATORY_CARE_PROVIDER_SITE_OTHER): Payer: Medicare Other | Admitting: Gastroenterology

## 2016-04-12 VITALS — BP 180/77 | HR 65 | Temp 97.8°F | Ht 63.0 in | Wt 135.4 lb

## 2016-04-12 DIAGNOSIS — R935 Abnormal findings on diagnostic imaging of other abdominal regions, including retroperitoneum: Secondary | ICD-10-CM

## 2016-04-12 DIAGNOSIS — R1012 Left upper quadrant pain: Secondary | ICD-10-CM | POA: Diagnosis present

## 2016-04-12 DIAGNOSIS — D508 Other iron deficiency anemias: Secondary | ICD-10-CM

## 2016-04-12 LAB — FOLATE: FOLATE: 26 ng/mL (ref >5.9)

## 2016-04-12 LAB — VITAMIN B12: Vitamin B-12: 418 pg/mL (ref 180–914)

## 2016-04-12 MED ORDER — OMEPRAZOLE 40 MG PO CPDR: 40.0000 mg | DELAYED_RELEASE_CAPSULE | Freq: Every day | ORAL | 0 refills | Status: DC

## 2016-04-12 MED ORDER — PEG 3350-KCL-NABCB-NACL-NASULF 236 G PO SOLR: ORAL | 0 refills | Status: DC

## 2016-04-12 NOTE — Progress Notes (Signed)
Gastroenterology Consultation  Referring Provider:     Albina Billet, MD Primary Care Physician:  Albina Billet, MD Primary Gastroenterologist:  Dr. Jonathon Bellows  Reason for Consultation:     Anemia,abdominal pain         HPI:   Tracie Fisher is a 80 y.o. y/o female referred for consultation & management  by Dr. Albina Billet, MD.   She was recently seen by Dr Sherrine Maples for anemia . She has been commenced on IV iron .  Abdominal pain: Onset: many years, no worse , every day , once daily , lasts till she goes to bed then it quits Site :left side of her abdomen  Radiation: localized Petra Kuba of pain: burning  Aggravating factors: nothing  Relieving factors :going to bed  Weight loss: yes- 3 lbs over a few months NSAID use: no  PPI use :no  Gall bladder surgery: yes many years back.  Frequency of bowel movements: mostly every day  Change in bowel movements: no  Relief with bowel movements: unsure  Gas/Bloating/Abdominal distension: unsure.  She says she has a blockage in her small intestines 2-3 years back and had surgery. She recalls her colonoscopy was also 2-3 years back and recalls she had a small polyp.    Denies any overt blood loss. No hematemesis, no vaginal blood loss. No blood in her urine. No family history of colon cancer.    Anemia: 03/23/2016 Hb 8, MCV 70 ,ferritin 4. No blood in urine.   CT abdomen: CBD 8.4 mm , mild sigmoid diverticulosis/.    Past Medical History:  Diagnosis Date  . Depression   . Hypertension   . Thyroid disease     Past Surgical History:  Procedure Laterality Date  . BREAST CYST ASPIRATION Left "years ago"    Prior to Admission medications   Medication Sig Start Date End Date Taking? Authorizing Provider  ALPRAZolam Duanne Moron) 1 MG tablet Take 1 mg by mouth 2 (two) times daily.    Yes Historical Provider, MD  cyanocobalamin (V-R VITAMIN B-12) 500 MCG tablet Take 500 mcg by mouth daily.    Yes Historical Provider, MD  DULoxetine  (CYMBALTA) 30 MG capsule Take 30 mg by mouth daily.    Yes Historical Provider, MD  levothyroxine (SYNTHROID, LEVOTHROID) 50 MCG tablet Take 50 mcg by mouth daily before breakfast.    Yes Historical Provider, MD  losartan (COZAAR) 50 MG tablet Take 50 mg by mouth daily.  03/02/16 03/02/17 Yes Historical Provider, MD  metoprolol tartrate (LOPRESSOR) 25 MG tablet Take 1 tablet by mouth 2 (two) times daily. 04/01/16 04/01/17 Yes Historical Provider, MD    Family History  Problem Relation Age of Onset  . Breast cancer Sister 29  . Heart attack Brother   . Heart disease Father      Social History  Substance Use Topics  . Smoking status: Former Smoker    Years: 20.00  . Smokeless tobacco: Never Used  . Alcohol use Not on file    Allergies as of 04/12/2016 - Review Complete 04/12/2016  Allergen Reaction Noted  . Aspirin Itching 02/11/2013  . Penicillin g Rash 07/29/2015    Review of Systems:    All systems reviewed and negative except where noted in HPI.   Physical Exam:  BP (!) 180/77   Pulse 65   Temp 97.8 F (36.6 C) (Oral)   Ht 5\' 3"  (1.6 m)   Wt 135 lb 6.4 oz (61.4 kg)   BMI 23.99  kg/m  No LMP recorded. Patient is postmenopausal. Psych:  Alert and cooperative. Normal mood and affect. General:   Alert,  Well-developed, well-nourished, pleasant and cooperative in NAD Head:  Normocephalic and atraumatic. Eyes:  Sclera clear, no icterus.   Conjunctiva pink. Ears:  Normal auditory acuity. Nose:  No deformity, discharge, or lesions. Mouth:  No deformity or lesions,oropharynx pink & moist. Neck:  Supple; no masses or thyromegaly. Lungs:  Respirations even and unlabored.  Clear throughout to auscultation.   No wheezes, crackles, or rhonchi. No acute distress. Heart:  Regular rate and rhythm; no murmurs, clicks, rubs, or gallops. Abdomen: mid line scar,   Normal bowel sounds.  No bruits.  Soft, non-tender and non-distended without masses, hepatosplenomegaly or hernias noted.   No guarding or rebound tenderness.    Psych:  Alert and cooperative. Normal mood and affect.  Imaging Studies: Ct Abdomen Pelvis W Contrast  Result Date: 03/31/2016 CLINICAL DATA:  Nine deficiency anemia. Left abdominal pain and burning sensation for the past 7 years. History of hiatal hernia. Ex-smoker. EXAM: CT ABDOMEN AND PELVIS WITH CONTRAST TECHNIQUE: Multidetector CT imaging of the abdomen and pelvis was performed using the standard protocol following bolus administration of intravenous contrast. CONTRAST:  157mL ISOVUE-300 IOPAMIDOL (ISOVUE-300) INJECTION 61% COMPARISON:  06/04/2013. FINDINGS: Lower chest: Minimal bibasilar linear atelectasis or scarring. Hepatobiliary: Cholecystectomy clips. Mild intrahepatic biliary ductal dilatation with progression. The common duct measures 8.4 mm in maximum diameter. Pancreas: Unremarkable. No pancreatic ductal dilatation or surrounding inflammatory changes. Spleen: Normal in size without focal abnormality. Adrenals/Urinary Tract: Normal appearing adrenal glands. Stable upper pole left renal cysts. Normal appearing right kidney. No bladder or ureteral abnormalities. No urinary tract calculi or hydronephrosis. Stomach/Bowel: Mild sigmoid colon diverticulosis. Moderately large hiatal hernia. Unremarkable small bowel. No evidence of appendicitis. Vascular/Lymphatic: Atheromatous arterial calcifications, including the abdominal aorta. No enlarged lymph nodes. Reproductive: Surgically absent uterus.  No adnexal masses. Other: None. Musculoskeletal: Extensive lumbar and upper sacral fixation hardware and laminectomy defects. Bone harvesting defects in both iliac bones. IMPRESSION: 1. Interval mild biliary ductal dilatation. This is within normal limits for a 80 year old patient post cholecystectomy. 2. Mild sigmoid colon diverticulosis. 3. Aortic atherosclerosis. Electronically Signed   By: Claudie Revering M.D.   On: 03/31/2016 18:53    Assessment and Plan:   Tracie Fisher is a 80 y.o. y/o female has been referred for iron deficiency anemia, abdominal pain .  1. Abdominal pain: Long standing. Trial of PPI and obtain MRCP.  2. Dilated common bile duct , with a history of anemia and abdominal pain : Will obtain MRCP, LFT . May be related to post operative changes.  3. Iron deficiency anemia: EGD+colonoscopy +/- capsule study ,celiac serology , b12,folate levels.   Follow up in 8 weeks   Dr Jonathon Bellows MD

## 2016-04-13 ENCOUNTER — Telehealth: Payer: Self-pay | Admitting: Gastroenterology

## 2016-04-13 ENCOUNTER — Ambulatory Visit: Payer: Medicare Other

## 2016-04-13 LAB — CELIAC DISEASE PANEL
Endomysial Ab, IgA: NEGATIVE
IGA: 203 mg/dL (ref 64–422)

## 2016-04-13 NOTE — Telephone Encounter (Signed)
Patient would like to talk to you regarding iron infusions that she taking. She is scheduled for a colonoscopy

## 2016-04-13 NOTE — Telephone Encounter (Signed)
Advised pt she can do ahead and have the iron infusion today as her colonoscopy is 04/25/16.

## 2016-04-14 ENCOUNTER — Other Ambulatory Visit
Admission: RE | Admit: 2016-04-14 | Discharge: 2016-04-14 | Disposition: A | Discharge status: Home / Self Care | Payer: Medicare Other | Source: Ambulatory Visit | Attending: Gastroenterology | Admitting: Gastroenterology

## 2016-04-14 DIAGNOSIS — R1012 Left upper quadrant pain: Secondary | ICD-10-CM | POA: Insufficient documentation

## 2016-04-14 NOTE — Progress Notes (Signed)
CC Tracie Fisher returns today for f/u for IDA.  HPI: She was intolerant to oral Iron, therefore received IV iron, she is due for her third dose today  She reports some improvemtn in fatigue already and has had no side effcts from oral Iron.  Her vitals are noted to be stable today as below:  Blood pressure (!) 194/77, pulse 79, temperature 98.5 F (36.9 C), temperature source Tympanic, resp. rate 18, weight 133 lb 11.3 oz (60.6 kg).   Exam shows her to be in no acute distress. Cbc today shows improved hgb to 9 from 8, all else stable B12 requested  Impression and Plan: IDA - improving. CT abd and pelvis dated 03/31/2016 reveleld hiatal hernia otherwise stable Mild sigmoid colon diverticulosis. Moderately large hiatal hernia. Unremarkable small bowel. No evidence of Appendicitis. However there was mild biliary ductal dilatation  GI evaluation for chronic dyspepsia, and occutl blood loss work up is pending. Proceed with Iv iron today as planned Return in 6 weeks with repeat labs and f/u with MD Keep f/u with GI.  Orders Placed This Encounter  Procedures  . CBC with Differential  . Ferritin  . Iron and TIBC

## 2016-04-16 LAB — H. PYLORI ANTIGEN, STOOL: H. PYLORI STOOL AG, EIA: NEGATIVE

## 2016-04-20 ENCOUNTER — Ambulatory Visit: Payer: Medicare Other

## 2016-04-25 ENCOUNTER — Ambulatory Visit
Admission: RE | Admit: 2016-04-25 | Discharge: 2016-04-25 | Disposition: A | Discharge status: Home / Self Care | Payer: Medicare Other | Source: Ambulatory Visit | Attending: Gastroenterology | Admitting: Gastroenterology

## 2016-04-25 ENCOUNTER — Ambulatory Visit: Payer: Medicare Other | Admitting: Anesthesiology

## 2016-04-25 ENCOUNTER — Encounter
Admission: RE | Disposition: A | Discharge status: Home / Self Care | Payer: Self-pay | Source: Ambulatory Visit | Attending: Gastroenterology

## 2016-04-25 DIAGNOSIS — K5730 Diverticulosis of large intestine without perforation or abscess without bleeding: Secondary | ICD-10-CM | POA: Insufficient documentation

## 2016-04-25 DIAGNOSIS — Z8249 Family history of ischemic heart disease and other diseases of the circulatory system: Secondary | ICD-10-CM | POA: Diagnosis not present

## 2016-04-25 DIAGNOSIS — D122 Benign neoplasm of ascending colon: Secondary | ICD-10-CM | POA: Diagnosis not present

## 2016-04-25 DIAGNOSIS — Z886 Allergy status to analgesic agent status: Secondary | ICD-10-CM | POA: Diagnosis not present

## 2016-04-25 DIAGNOSIS — D649 Anemia, unspecified: Secondary | ICD-10-CM

## 2016-04-25 DIAGNOSIS — K3189 Other diseases of stomach and duodenum: Secondary | ICD-10-CM | POA: Insufficient documentation

## 2016-04-25 DIAGNOSIS — D509 Iron deficiency anemia, unspecified: Secondary | ICD-10-CM | POA: Diagnosis present

## 2016-04-25 DIAGNOSIS — I10 Essential (primary) hypertension: Secondary | ICD-10-CM | POA: Insufficient documentation

## 2016-04-25 DIAGNOSIS — F329 Major depressive disorder, single episode, unspecified: Secondary | ICD-10-CM | POA: Diagnosis not present

## 2016-04-25 DIAGNOSIS — K222 Esophageal obstruction: Secondary | ICD-10-CM | POA: Diagnosis not present

## 2016-04-25 DIAGNOSIS — K648 Other hemorrhoids: Secondary | ICD-10-CM | POA: Insufficient documentation

## 2016-04-25 DIAGNOSIS — K219 Gastro-esophageal reflux disease without esophagitis: Secondary | ICD-10-CM | POA: Diagnosis not present

## 2016-04-25 DIAGNOSIS — Z803 Family history of malignant neoplasm of breast: Secondary | ICD-10-CM | POA: Diagnosis not present

## 2016-04-25 DIAGNOSIS — D508 Other iron deficiency anemias: Secondary | ICD-10-CM | POA: Diagnosis not present

## 2016-04-25 DIAGNOSIS — D125 Benign neoplasm of sigmoid colon: Secondary | ICD-10-CM | POA: Diagnosis not present

## 2016-04-25 DIAGNOSIS — Z87891 Personal history of nicotine dependence: Secondary | ICD-10-CM | POA: Diagnosis not present

## 2016-04-25 DIAGNOSIS — Z880 Allergy status to penicillin: Secondary | ICD-10-CM | POA: Diagnosis not present

## 2016-04-25 DIAGNOSIS — K449 Diaphragmatic hernia without obstruction or gangrene: Secondary | ICD-10-CM | POA: Diagnosis not present

## 2016-04-25 HISTORY — PX: COLONOSCOPY WITH PROPOFOL: SHX5780

## 2016-04-25 HISTORY — PX: ESOPHAGOGASTRODUODENOSCOPY (EGD) WITH PROPOFOL: SHX5813

## 2016-04-25 SURGERY — COLONOSCOPY WITH PROPOFOL
Anesthesia: General

## 2016-04-25 MED ORDER — PROPOFOL 500 MG/50ML IV EMUL
INTRAVENOUS | Status: DC | PRN
  Administered 2016-04-25: 100 ug/kg/min via INTRAVENOUS

## 2016-04-25 MED ORDER — LIDOCAINE 2% (20 MG/ML) 5 ML SYRINGE
INTRAMUSCULAR | Status: DC | PRN
  Administered 2016-04-25: 25 mg via INTRAVENOUS

## 2016-04-25 MED ORDER — MIDAZOLAM HCL 5 MG/5ML IJ SOLN
INTRAMUSCULAR | Status: DC | PRN
  Administered 2016-04-25: 1 mg via INTRAVENOUS

## 2016-04-25 MED ORDER — SODIUM CHLORIDE 0.9 % IV SOLN
INTRAVENOUS | Status: DC
  Administered 2016-04-25: 1000 mL via INTRAVENOUS

## 2016-04-25 MED ORDER — PROPOFOL 10 MG/ML IV BOLUS
INTRAVENOUS | Status: DC | PRN
  Administered 2016-04-25 (×2): 50 mg via INTRAVENOUS

## 2016-04-25 NOTE — Transfer of Care (Signed)
Immediate Anesthesia Transfer of Care Note  Patient: Tracie Fisher  Procedure(s) Performed: Procedure(s): COLONOSCOPY WITH PROPOFOL (N/A) ESOPHAGOGASTRODUODENOSCOPY (EGD) WITH PROPOFOL (N/A)  Patient Location: Endoscopy Unit  Anesthesia Type:General  Level of Consciousness: awake and alert   Airway & Oxygen Therapy: Patient Spontanous Breathing and Patient connected to nasal cannula oxygen  Post-op Assessment: Report given to RN and Post -op Vital signs reviewed and stable  Post vital signs: Reviewed  Last Vitals:  Vitals:   04/25/16 0853 04/25/16 0958  BP: (!) 182/82 (!) 115/96  Pulse: 83 75  Resp: 16 14  Temp: 36.4 C (!) 35.9 C    Last Pain:  Vitals:   04/25/16 0853  TempSrc: Tympanic         Complications: No apparent anesthesia complications

## 2016-04-25 NOTE — H&P (Signed)
  Jonathon Bellows MD [address], [place], [place] [address] Phone: [phone] Fax : [phone]  Primary Care Physician:  Albina Billet, MD Primary Gastroenterologist:  Dr. Jonathon Bellows   Pre-Procedure History & Physical: HPI:  Tracie Fisher is a 80 y.o. female is here for an endoscopy and colonoscopy.   Past Medical History:  Diagnosis Date  . Depression   . Hypertension   . Thyroid disease     Past Surgical History:  Procedure Laterality Date  . BREAST CYST ASPIRATION Left "years ago"    Prior to Admission medications   Medication Sig Start Date End Date Taking? Authorizing Provider  ALPRAZolam Duanne Moron) 1 MG tablet Take 1 mg by mouth 2 (two) times daily.    Yes Historical Provider, MD  cyanocobalamin (V-R VITAMIN B-12) 500 MCG tablet Take 500 mcg by mouth daily.    Yes Historical Provider, MD  DULoxetine (CYMBALTA) 30 MG capsule Take 30 mg by mouth daily.    Yes Historical Provider, MD  levothyroxine (SYNTHROID, LEVOTHROID) 50 MCG tablet Take 50 mcg by mouth daily before breakfast.    Yes Historical Provider, MD  losartan (COZAAR) 50 MG tablet Take 50 mg by mouth daily.  03/02/16 03/02/17 Yes Historical Provider, MD  metoprolol tartrate (LOPRESSOR) 25 MG tablet Take 1 tablet by mouth 2 (two) times daily. 04/01/16 04/01/17 Yes Historical Provider, MD  omeprazole (PRILOSEC) 40 MG capsule Take 1 capsule (40 mg total) by mouth daily. 04/12/16 06/12/16 Yes Jonathon Bellows, MD  polyethylene glycol (GOLYTELY) 236 g solution Drink one 8 oz glass every 20 mins until stools are clear 04/12/16  Yes Jonathon Bellows, MD    Allergies as of 04/12/2016 - Review Complete 04/12/2016  Allergen Reaction Noted  . Aspirin Itching 02/11/2013  . Penicillin g Rash 07/29/2015    Family History  Problem Relation Age of Onset  . Breast cancer Sister 28  . Heart attack Brother   . Heart disease Father     Social History   Social History  . Marital status: Single    Spouse name: N/A  . Number of  children: N/A  . Years of education: N/A   Occupational History  . Not on file.   Social History Main Topics  . Smoking status: Former Smoker    Years: 20.00  . Smokeless tobacco: Never Used  . Alcohol use Not on file  . Drug use: Unknown  . Sexual activity: Not on file   Other Topics Concern  . Not on file   Social History Narrative  . No narrative on file    Review of Systems: See HPI, otherwise negative ROS  Physical Exam: There were no vitals taken for this visit. General:   Alert,  pleasant and cooperative in NAD Head:  Normocephalic and atraumatic. Neck:  Supple; no masses or thyromegaly. Lungs:  Clear throughout to auscultation.    Heart:  Regular rate and rhythm. Abdomen:  Soft, nontender and nondistended. Normal bowel sounds, without guarding, and without rebound.   Neurologic:  Alert and  oriented x4;  grossly normal neurologically.  Impression/Plan: Tracie Fisher is here for an endoscopy and colonoscopy to be performed for iron deficiency anemia  Risks, benefits, limitations, and alternatives regarding  endoscopy and colonoscopy have been reviewed with the patient.  Questions have been answered.  All parties agreeable.   Jonathon Bellows, MD  04/25/2016, 8:54 AM

## 2016-04-25 NOTE — Anesthesia Preprocedure Evaluation (Signed)
Anesthesia Evaluation  Patient identified by MRN, date of birth, ID band Patient awake    Reviewed: Allergy & Precautions, H&P , NPO status , Patient's Chart, lab work & pertinent test results  History of Anesthesia Complications Negative for: history of anesthetic complications  Airway Mallampati: III  TM Distance: <3 FB Neck ROM: limited    Dental  (+) Poor Dentition, Missing, Upper Dentures, Lower Dentures   Pulmonary former smoker,    Pulmonary exam normal breath sounds clear to auscultation       Cardiovascular Exercise Tolerance: Good hypertension, (-) angina(-) Past MI and (-) DOE Normal cardiovascular exam Rhythm:regular Rate:Normal     Neuro/Psych PSYCHIATRIC DISORDERS Depression negative neurological ROS     GI/Hepatic Neg liver ROS, GERD  Medicated and Controlled,  Endo/Other  negative endocrine ROS  Renal/GU negative Renal ROS  negative genitourinary   Musculoskeletal   Abdominal   Peds  Hematology negative hematology ROS (+)   Anesthesia Other Findings Past Medical History: No date: Depression No date: Hypertension No date: Thyroid disease  Past Surgical History: "years ago": BREAST CYST ASPIRATION Left  BMI    Body Mass Index:  23.91 kg/m      Reproductive/Obstetrics negative OB ROS                             Anesthesia Physical Anesthesia Plan  ASA: III  Anesthesia Plan: General   Post-op Pain Management:    Induction:   Airway Management Planned:   Additional Equipment:   Intra-op Plan:   Post-operative Plan:   Informed Consent: I have reviewed the patients History and Physical, chart, labs and discussed the procedure including the risks, benefits and alternatives for the proposed anesthesia with the patient or authorized representative who has indicated his/her understanding and acceptance.   Dental Advisory Given  Plan Discussed with:  Anesthesiologist, CRNA and Surgeon  Anesthesia Plan Comments:         Anesthesia Quick Evaluation

## 2016-04-25 NOTE — Anesthesia Postprocedure Evaluation (Signed)
Anesthesia Post Note  Patient: Tracie Fisher  Procedure(s) Performed: Procedure(s) (LRB): COLONOSCOPY WITH PROPOFOL (N/A) ESOPHAGOGASTRODUODENOSCOPY (EGD) WITH PROPOFOL (N/A)  Patient location during evaluation: Endoscopy Anesthesia Type: General Level of consciousness: awake and alert Pain management: pain level controlled Vital Signs Assessment: post-procedure vital signs reviewed and stable Respiratory status: spontaneous breathing, nonlabored ventilation, respiratory function stable and patient connected to nasal cannula oxygen Cardiovascular status: blood pressure returned to baseline and stable Postop Assessment: no signs of nausea or vomiting Anesthetic complications: no    Last Vitals:  Vitals:   04/25/16 0950 04/25/16 0958  BP: (!) 115/96 (!) 115/96  Pulse: 73 75  Resp: 16 14  Temp: (!) 35.9 C (!) 35.9 C    Last Pain:  Vitals:   04/25/16 0950  TempSrc: Tympanic                 Precious Haws Piscitello

## 2016-04-25 NOTE — Op Note (Signed)
[facility] Gastroenterology Patient Name: Tracie Fisher Procedure Date: 04/25/2016 9:12 AM MRN: [id number] Account #: [id number] Date of Birth: 1936-01-15 Admit Type: Outpatient Age: 80 Room: [facility] ENDO ROOM 1 Gender: Female Note Status: Finalized Procedure:            Upper GI endoscopy Indications:          Iron deficiency anemia Providers:            Jonathon Bellows MD, MD Referring MD:         Leona Carry. Hall Busing, MD (Referring MD) Medicines:            Monitored Anesthesia Care Complications:        No immediate complications. Procedure:            Pre-Anesthesia Assessment:                       - Prior to the procedure, a History and Physical was                        performed, and patient medications, allergies and                        sensitivities were reviewed. The patient's tolerance of                        previous anesthesia was reviewed.                       - The risks and benefits of the procedure and the                        sedation options and risks were discussed with the                        patient. All questions were answered and informed                        consent was obtained.                       - The risks and benefits of the procedure and the                        sedation options and risks were discussed with the                        patient. All questions were answered and informed                        consent was obtained.                       - ASA Grade Assessment: III - A patient with severe                        systemic disease.                       After obtaining informed consent, the endoscope was  passed under direct vision. Throughout the procedure,                        the patient's blood pressure, pulse, and oxygen                        saturations were monitored continuously. The Endoscope                        was introduced through the mouth, and advanced to the                         third part of duodenum. The upper GI endoscopy was                        accomplished with ease. The patient tolerated the                        procedure well. Findings:      The examined duodenum was normal. Biopsies for histology were taken with       a cold forceps for evaluation of celiac disease.      An 8 cm hiatal hernia was present.      A mild Schatzki ring (acquired) was found in the lower third of the       esophagus. Impression:           - Normal examined duodenum. Biopsied.                       - 8 cm hiatal hernia.                       - Mild Schatzki ring. Recommendation:       - Patient has a contact number available for                        emergencies. The signs and symptoms of potential                        delayed complications were discussed with the patient.                        Return to normal activities tomorrow. Written discharge                        instructions were provided to the patient.                       - Discharge patient to home (with escort).                       - Resume previous diet.                       - Continue present medications.                       - Await pathology results.                       - Return to my office as previously scheduled. Procedure Code(s):    ---  Professional ---                       [phone], Esophagogastroduodenoscopy, flexible, transoral;                        with biopsy, single or multiple Diagnosis Code(s):    --- Professional ---                       K44.9, Diaphragmatic hernia without obstruction or                        gangrene                       K22.2, Esophageal obstruction                       D50.9, Iron deficiency anemia, unspecified CPT copyright 2016 [employer]. All rights reserved. The codes documented in this report are preliminary and upon coder review may  be revised to meet current compliance requirements. Jonathon Bellows, MD Jonathon Bellows MD,  MD 04/25/2016 9:25:15 AM This report has been signed electronically. Number of Addenda: 0 Note Initiated On: 04/25/2016 9:12 AM      [facility]

## 2016-04-25 NOTE — Op Note (Signed)
[facility] Gastroenterology Patient Name: Tracie Fisher Procedure Date: 04/25/2016 9:11 AM MRN: [id number] Account #: [id number] Date of Birth: 06/29/1935 Admit Type: Outpatient Age: 80 Room: [facility] ENDO ROOM 1 Gender: Female Note Status: Finalized Procedure:            Colonoscopy Indications:          Iron deficiency anemia Providers:            Jonathon Bellows MD, MD Referring MD:         Leona Carry. Hall Busing, MD (Referring MD) Medicines:            Monitored Anesthesia Care Complications:        No immediate complications. Procedure:            Pre-Anesthesia Assessment:                       - Prior to the procedure, a History and Physical was                        performed, and patient medications, allergies and                        sensitivities were reviewed. The patient's tolerance of                        previous anesthesia was reviewed.                       - The risks and benefits of the procedure and the                        sedation options and risks were discussed with the                        patient. All questions were answered and informed                        consent was obtained.                       - The risks and benefits of the procedure and the                        sedation options and risks were discussed with the                        patient. All questions were answered and informed                        consent was obtained.                       - The risks and benefits of the procedure and the                        sedation options and risks were discussed with the                        patient. All questions were answered and informed  consent was obtained.                       - ASA Grade Assessment: III - A patient with severe                        systemic disease.                       After obtaining informed consent, the colonoscope was                        passed under direct vision.  Throughout the procedure,                        the patient's blood pressure, pulse, and oxygen                        saturations were monitored continuously. The                        Colonoscope was introduced through the anus and                        advanced to the the cecum, identified by the                        appendiceal orifice, IC valve and transillumination.                        The colonoscopy was performed with moderate difficulty                        due to a tortuous colon. Successful completion of the                        procedure was aided by applying abdominal pressure. The                        patient tolerated the procedure fairly well. The                        colonoscopy was [Degree of difficulty] due to                        significant looping. Successful completion of the                        procedure was aided by withdrawing the scope and                        replacing with the pediatric endoscope. Findings:      Multiple medium-mouthed diverticula were found in the sigmoid colon.       There was no evidence of diverticular bleeding.      Non-bleeding internal hemorrhoids were found during retroflexion. The       hemorrhoids were large.      A 10 mm polyp was found in the proximal ascending colon. The polyp was       sessile. The polyp was removed with a hot snare. Resection and retrieval  were complete.      A 5 mm polyp was found in the sigmoid colon. The polyp was sessile. The       polyp was removed with a cold snare. Resection and retrieval were       complete. Impression:           - Severe diverticulosis in the sigmoid colon. There was                        no evidence of diverticular bleeding.                       - Non-bleeding internal hemorrhoids.                       - One 10 mm polyp in the proximal ascending colon,                        removed with a hot snare. Resected and retrieved.                       - One  5 mm polyp in the sigmoid colon, removed with a                        cold snare. Resected and retrieved. Recommendation:       - Patient has a contact number available for                        emergencies. The signs and symptoms of potential                        delayed complications were discussed with the patient.                        Return to normal activities tomorrow. Written discharge                        instructions were provided to the patient.                       - Resume previous diet.                       - Continue present medications.                       - Repeat colonoscopy for surveillance based on                        pathology results.                       - Return to my office as previously scheduled.                       - To visualize the small bowel, perform video capsule                        endoscopy in 2 weeks.                       - Discharge patient to  home (with escort). Procedure Code(s):    --- Professional ---                       [id number], Colonoscopy, flexible; with removal of tumor(s),                        polyp(s), or other lesion(s) by snare technique Diagnosis Code(s):    --- Professional ---                       K64.8, Other hemorrhoids                       D12.2, Benign neoplasm of ascending colon                       D12.5, Benign neoplasm of sigmoid colon                       D50.9, Iron deficiency anemia, unspecified                       K57.30, Diverticulosis of large intestine without                        perforation or abscess without bleeding CPT copyright 2016 American Medical Association. All rights reserved. The codes documented in this report are preliminary and upon coder review may  be revised to meet current compliance requirements. Jonathon Bellows, MD Jonathon Bellows MD, MD 04/25/2016 9:58:50 AM This report has been signed electronically. Number of Addenda: 0 Note Initiated On: 04/25/2016 9:11 AM Scope  Withdrawal Time: 0 hours 12 minutes 29 seconds  Total Procedure Duration: 0 hours 28 minutes 48 seconds       [facility]

## 2016-04-26 ENCOUNTER — Encounter: Payer: Self-pay | Admitting: Gastroenterology

## 2016-04-26 LAB — SURGICAL PATHOLOGY

## 2016-04-27 ENCOUNTER — Other Ambulatory Visit: Payer: Self-pay | Admitting: Internal Medicine

## 2016-04-27 ENCOUNTER — Ambulatory Visit: Payer: Medicare Other

## 2016-04-27 DIAGNOSIS — Z1231 Encounter for screening mammogram for malignant neoplasm of breast: Secondary | ICD-10-CM

## 2016-05-03 ENCOUNTER — Telehealth: Payer: Self-pay | Admitting: Gastroenterology

## 2016-05-03 NOTE — Telephone Encounter (Signed)
Patient called and wanted to know if she needs a follow up and she was under the understanding that she was going to have the capsule test. Please call

## 2016-05-04 ENCOUNTER — Other Ambulatory Visit: Payer: Self-pay

## 2016-05-04 NOTE — Telephone Encounter (Signed)
-----   Message from Jonathon Bellows, MD sent at 04/27/2016  9:02 AM EST ----- Normal duodenal bx, tubular adenomas x2 , 10 mm in sizde- repeat colonoscopy in 3 years

## 2016-05-04 NOTE — Telephone Encounter (Signed)
Pt notified of colonoscopy results and the need to order capsule study. Will contact pt tomorrow with this appt.

## 2016-05-11 ENCOUNTER — Ambulatory Visit: Payer: Medicare Other | Admitting: Hematology and Oncology

## 2016-05-11 ENCOUNTER — Ambulatory Visit
Admission: RE | Admit: 2016-05-11 | Discharge: 2016-05-11 | Disposition: A | Discharge status: Home / Self Care | Payer: Medicare Other | Source: Ambulatory Visit | Attending: Gastroenterology | Admitting: Gastroenterology

## 2016-05-11 ENCOUNTER — Encounter
Admission: RE | Disposition: A | Discharge status: Home / Self Care | Payer: Self-pay | Source: Ambulatory Visit | Attending: Gastroenterology

## 2016-05-11 ENCOUNTER — Other Ambulatory Visit: Payer: Medicare Other

## 2016-05-11 DIAGNOSIS — D509 Iron deficiency anemia, unspecified: Secondary | ICD-10-CM | POA: Diagnosis present

## 2016-05-11 HISTORY — PX: GIVENS CAPSULE STUDY: SHX5432

## 2016-05-11 SURGERY — IMAGING PROCEDURE, GI TRACT, INTRALUMINAL, VIA CAPSULE

## 2016-05-11 NOTE — OR Nursing (Signed)
Pt I/s on capsule study written and verbally. Understanding noted.left at 0730. To return at 1600. I/s to call rn in endo if light not blinking. Understanding voiced

## 2016-05-12 ENCOUNTER — Encounter: Payer: Self-pay | Admitting: Gastroenterology

## 2016-05-12 DIAGNOSIS — D509 Iron deficiency anemia, unspecified: Secondary | ICD-10-CM | POA: Diagnosis not present

## 2016-05-18 ENCOUNTER — Inpatient Hospital Stay: Payer: Medicare Other | Attending: Hematology and Oncology | Admitting: Hematology and Oncology

## 2016-05-18 ENCOUNTER — Inpatient Hospital Stay: Payer: Medicare Other

## 2016-05-18 ENCOUNTER — Encounter: Payer: Self-pay | Admitting: Hematology and Oncology

## 2016-05-18 ENCOUNTER — Other Ambulatory Visit: Payer: Self-pay

## 2016-05-18 ENCOUNTER — Other Ambulatory Visit: Payer: Self-pay | Admitting: Hematology and Oncology

## 2016-05-18 DIAGNOSIS — K449 Diaphragmatic hernia without obstruction or gangrene: Secondary | ICD-10-CM | POA: Insufficient documentation

## 2016-05-18 DIAGNOSIS — Z87891 Personal history of nicotine dependence: Secondary | ICD-10-CM | POA: Insufficient documentation

## 2016-05-18 DIAGNOSIS — K3189 Other diseases of stomach and duodenum: Secondary | ICD-10-CM | POA: Insufficient documentation

## 2016-05-18 DIAGNOSIS — Z803 Family history of malignant neoplasm of breast: Secondary | ICD-10-CM | POA: Diagnosis not present

## 2016-05-18 DIAGNOSIS — E039 Hypothyroidism, unspecified: Secondary | ICD-10-CM | POA: Diagnosis not present

## 2016-05-18 DIAGNOSIS — Z86010 Personal history of colonic polyps: Secondary | ICD-10-CM | POA: Diagnosis not present

## 2016-05-18 DIAGNOSIS — F329 Major depressive disorder, single episode, unspecified: Secondary | ICD-10-CM | POA: Insufficient documentation

## 2016-05-18 DIAGNOSIS — I700 Atherosclerosis of aorta: Secondary | ICD-10-CM | POA: Diagnosis not present

## 2016-05-18 DIAGNOSIS — D500 Iron deficiency anemia secondary to blood loss (chronic): Secondary | ICD-10-CM | POA: Diagnosis not present

## 2016-05-18 DIAGNOSIS — I10 Essential (primary) hypertension: Secondary | ICD-10-CM | POA: Diagnosis not present

## 2016-05-18 DIAGNOSIS — K5730 Diverticulosis of large intestine without perforation or abscess without bleeding: Secondary | ICD-10-CM | POA: Diagnosis not present

## 2016-05-18 DIAGNOSIS — K838 Other specified diseases of biliary tract: Secondary | ICD-10-CM | POA: Diagnosis not present

## 2016-05-18 DIAGNOSIS — Z79899 Other long term (current) drug therapy: Secondary | ICD-10-CM | POA: Diagnosis not present

## 2016-05-18 LAB — CBC WITH DIFFERENTIAL/PLATELET
Basophils Absolute: 0.1 10*3/uL (ref 0–0.1)
Basophils Relative: 1 %
EOS ABS: 0.1 10*3/uL (ref 0–0.7)
EOS PCT: 2 %
HCT: 37.2 % (ref 35.0–47.0)
Hemoglobin: 11.9 g/dL — ABNORMAL LOW (ref 12.0–16.0)
LYMPHS ABS: 2.5 10*3/uL (ref 1.0–3.6)
Lymphocytes Relative: 37 %
MCH: 27.9 pg (ref 26.0–34.0)
MCHC: 32.1 g/dL (ref 32.0–36.0)
MCV: 87 fL (ref 80.0–100.0)
Monocytes Absolute: 0.6 10*3/uL (ref 0.2–0.9)
Monocytes Relative: 9 %
Neutro Abs: 3.6 10*3/uL (ref 1.4–6.5)
Neutrophils Relative %: 51 %
PLATELETS: 281 10*3/uL (ref 150–440)
RBC: 4.28 MIL/uL (ref 3.80–5.20)
RDW: 27.8 % — ABNORMAL HIGH (ref 11.5–14.5)
WBC: 6.9 10*3/uL (ref 3.6–11.0)

## 2016-05-18 LAB — FERRITIN: FERRITIN: 16 ng/mL (ref 11–307)

## 2016-05-18 NOTE — Assessment & Plan Note (Signed)
She has marked improvement in energy level after recent IV iron treatment with near complete recovery of anemia She had recent extensive GI work-up done I suspect the most likely cause of her iron deficiency is from poor diet (she does not eat much meat) and possible chronic microscopic GI bleed I recommend iron rich diet such as hamburgers and recommend she tries oral iron supplement at bedtime several times a week She is reluctant to try oral iron again due to history of severe constipation I recommend recheck CBC and ferritin with her primary care doctor in 3 months We will see her back with further IV iron treatment as needed

## 2016-05-18 NOTE — Progress Notes (Signed)
[facility] FOLLOW-UP progress notes  Patient Care Team: Albina Billet, MD as PCP - General (Internal Medicine)  CHIEF COMPLAINTS/PURPOSE OF VISIT:  Severe iron deficiency anemia  HISTORY OF PRESENTING ILLNESS:  Tracie Fisher 81 y.o. female was transferred to my care after her prior physician has left.  I reviewed the patient's records extensive and collaborated the history with the patient. Summary of her history is as follows: She was recently seen by locum hematologist for management of severe iron deficiency anemia The patient typically does not eat much meat She had recent extensive GI work-up  On 03/31/16, CT abdomen showed interval mild biliary ductal dilatation. This is within normal limits for a 81 year old patient post cholecystectomy. 2. Mild sigmoid colon diverticulosis. 3. Aortic atherosclerosis.  On 04/25/16, upper endoscopy showed normal examined duodenum. Biopsied. There is also 8 cm hiatal hernia & mild Schatzki ring. Colon0scopy revealed: Severe diverticulosis in the sigmoid colon. There was no evidence of diverticular bleeding. There are non-bleeding internal hemorrhoids. - One 10 mm polyp in the proximal ascending colon, removed with a hot snare. Resected and retrieved. - One 5 mm polyp in the sigmoid colon, removed with a cold snare. Resected and retrieved Pathology report: DIAGNOSIS:  A. DUODENUM, THIRD PORTION; COLD BIOPSY:  - DUODENAL MUCOSA WITH PRESERVED VILLOUS ARCHITECTURE AND BRUNNER'S  GLAND HYPERPLASIA.  - NEGATIVE FOR INTRA-EPITHELIAL LYMPHOCYTOSIS, DYSPLASIA AND MALIGNANCY.   B. DUODENAL BULB; COLD BIOPSY:  - DUODENAL MUCOSA WITH NODULAR BRUNNER'S GLAND HYPERPLASIA.  - NEGATIVE FOR INTRA-EPITHELIAL LYMPHOCYTOSIS, DYSPLASIA AND MALIGNANCY.   C. COLON POLYP, PROXIMAL ASCENDING; HOT SNARE:  - TUBULAR ADENOMA.  - NEGATIVE FOR HIGH-GRADE DYSPLASIA AND MALIGNANCY.   D. COLON POLYP, SIGMOID; COLD SNARE:  - TUBULAR ADENOMA.  - NEGATIVE  FOR HIGH-GRADE DYSPLASIA AND MALIGNANCY.   She received IV iron treatment in November 2017 She feels well The patient denies any recent signs or symptoms of bleeding such as spontaneous epistaxis, hematuria or hematochezia.   MEDICAL HISTORY:  Past Medical History:  Diagnosis Date  . Depression   . Hypertension   . Thyroid disease     SURGICAL HISTORY: Past Surgical History:  Procedure Laterality Date  . BREAST CYST ASPIRATION Left "years ago"  . COLONOSCOPY WITH PROPOFOL N/A 04/25/2016   Procedure: COLONOSCOPY WITH PROPOFOL;  Surgeon: Jonathon Bellows, MD;  Location: [facility] ENDOSCOPY;  Service: Endoscopy;  Laterality: N/A;  . ESOPHAGOGASTRODUODENOSCOPY (EGD) WITH PROPOFOL N/A 04/25/2016   Procedure: ESOPHAGOGASTRODUODENOSCOPY (EGD) WITH PROPOFOL;  Surgeon: Jonathon Bellows, MD;  Location: [facility] ENDOSCOPY;  Service: Endoscopy;  Laterality: N/A;  . GIVENS CAPSULE STUDY N/A 05/11/2016   Procedure: GIVENS CAPSULE STUDY;  Surgeon: Jonathon Bellows, MD;  Location: [facility] ENDOSCOPY;  Service: Endoscopy;  Laterality: N/A;    SOCIAL HISTORY: Social History   Social History  . Marital status: Single    Spouse name: N/A  . Number of children: N/A  . Years of education: N/A   Occupational History  . Not on file.   Social History Main Topics  . Smoking status: Former Smoker    Years: 20.00  . Smokeless tobacco: Never Used  . Alcohol use Not on file  . Drug use: Unknown  . Sexual activity: Not on file   Other Topics Concern  . Not on file   Social History Narrative  . No narrative on file    FAMILY HISTORY: Family History  Problem Relation Age of Onset  . Breast cancer Sister 2  . Heart attack Brother   .  Heart disease Father     ALLERGIES:  is allergic to aspirin and penicillin g.  MEDICATIONS:  Current Outpatient Prescriptions  Medication Sig Dispense Refill  . ALPRAZolam (XANAX) 1 MG tablet Take 1 mg by mouth 2 (two) times daily.     . cyanocobalamin (V-R VITAMIN B-12) 500 MCG  tablet Take 500 mcg by mouth daily.     . DULoxetine (CYMBALTA) 30 MG capsule Take 30 mg by mouth daily.     Marland Kitchen levothyroxine (SYNTHROID, LEVOTHROID) 50 MCG tablet Take 50 mcg by mouth daily before breakfast.     . losartan (COZAAR) 50 MG tablet Take 50 mg by mouth daily.     . metoprolol tartrate (LOPRESSOR) 25 MG tablet Take 1 tablet by mouth 2 (two) times daily.    Marland Kitchen omeprazole (PRILOSEC) 40 MG capsule Take 1 capsule (40 mg total) by mouth daily. 61 capsule 0  . polyethylene glycol (GOLYTELY) 236 g solution Drink one 8 oz glass every 20 mins until stools are clear 4000 mL 0   No current facility-administered medications for this visit.     REVIEW OF SYSTEMS:   Constitutional: Denies fevers, chills or abnormal night sweats Eyes: Denies blurriness of vision, double vision or watery eyes Ears, nose, mouth, throat, and face: Denies mucositis or sore throat Respiratory: Denies cough, dyspnea or wheezes Cardiovascular: Denies palpitation, chest discomfort or lower extremity swelling Gastrointestinal:  Denies nausea, heartburn or change in bowel habits Skin: Denies abnormal skin rashes Lymphatics: Denies new lymphadenopathy or easy bruising Neurological:Denies numbness, tingling or new weaknesses Behavioral/Psych: Mood is stable, no new changes  All other systems were reviewed with the patient and are negative.  PHYSICAL EXAMINATION: ECOG PERFORMANCE STATUS: 0 - Asymptomatic  Vitals:   05/18/16 1400  BP: (!) 157/78  Pulse: 67  Temp: 97.2 F (36.2 C)   Filed Weights   05/18/16 1400  Weight: 140 lb 15.8 oz (64 kg)    GENERAL:alert, no distress and comfortable SKIN: skin color, texture, turgor are normal, no rashes or significant lesions EYES: normal, conjunctiva are pink and non-injected, sclera clear PSYCH: alert & oriented x 3 with fluent speech NEURO: no focal motor/sensory deficits  LABORATORY DATA:  I have reviewed the data as listed Lab Results  Component Value Date    WBC 6.9 05/18/2016   HGB 11.9 (L) 05/18/2016   HCT 37.2 05/18/2016   MCV 87.0 05/18/2016   PLT 281 05/18/2016    Recent Labs  03/31/16 1438  CREATININE 0.90    ASSESSMENT & PLAN:  Iron deficiency anemia due to chronic blood loss She has marked improvement in energy level after recent IV iron treatment with near complete recovery of anemia She had recent extensive GI work-up done I suspect the most likely cause of her iron deficiency is from poor diet (she does not eat much meat) and possible chronic microscopic GI bleed I recommend iron rich diet such as hamburgers and recommend she tries oral iron supplement at bedtime several times a week She is reluctant to try oral iron again due to history of severe constipation I recommend recheck CBC and ferritin with her primary care doctor in 3 months We will see her back with further IV iron treatment as needed   No orders of the defined types were placed in this encounter.   All questions were answered. The patient knows to call the clinic with any problems, questions or concerns. I spent 12 minutes counseling the patient face to face. The total time  spent in the appointment was 20 minutes and more than 50% was on counseling.     Heath Lark, MD 05/18/2016 3:35 PM

## 2016-05-30 ENCOUNTER — Other Ambulatory Visit: Payer: Self-pay | Admitting: Gastroenterology

## 2016-06-03 ENCOUNTER — Other Ambulatory Visit: Payer: Self-pay

## 2016-06-03 ENCOUNTER — Telehealth: Payer: Self-pay | Admitting: Gastroenterology

## 2016-06-03 MED ORDER — OMEPRAZOLE 40 MG PO CPDR: 40.0000 mg | DELAYED_RELEASE_CAPSULE | Freq: Every day | ORAL | 5 refills | Status: DC

## 2016-06-03 NOTE — Telephone Encounter (Signed)
Pt notified rx was sent to pharmacy

## 2016-06-03 NOTE — Telephone Encounter (Signed)
Omeprazole 40 mg Med Cap on [address]  She needs a refill and this wasn't called in with the others.

## 2016-06-09 ENCOUNTER — Ambulatory Visit
Admission: RE | Admit: 2016-06-09 | Discharge: 2016-06-09 | Disposition: A | Discharge status: Home / Self Care | Payer: Medicare Other | Source: Ambulatory Visit | Attending: Internal Medicine | Admitting: Internal Medicine

## 2016-06-09 DIAGNOSIS — Z1231 Encounter for screening mammogram for malignant neoplasm of breast: Secondary | ICD-10-CM

## 2016-06-13 ENCOUNTER — Encounter: Payer: Self-pay | Admitting: Gastroenterology

## 2016-06-13 ENCOUNTER — Other Ambulatory Visit
Admission: RE | Admit: 2016-06-13 | Discharge: 2016-06-13 | Disposition: A | Discharge status: Home / Self Care | Payer: Medicare Other | Source: Ambulatory Visit | Attending: Gastroenterology | Admitting: Gastroenterology

## 2016-06-13 ENCOUNTER — Ambulatory Visit (INDEPENDENT_AMBULATORY_CARE_PROVIDER_SITE_OTHER): Payer: Medicare Other | Admitting: Gastroenterology

## 2016-06-13 VITALS — BP 140/80 | HR 65 | Wt 141.0 lb

## 2016-06-13 DIAGNOSIS — D509 Iron deficiency anemia, unspecified: Secondary | ICD-10-CM | POA: Diagnosis not present

## 2016-06-13 DIAGNOSIS — R1010 Upper abdominal pain, unspecified: Secondary | ICD-10-CM

## 2016-06-13 LAB — HEPATIC FUNCTION PANEL
ALBUMIN: 4.1 g/dL (ref 3.5–5.0)
ALK PHOS: 75 U/L (ref 38–126)
ALT: 15 U/L (ref 14–54)
AST: 27 U/L (ref 15–41)
BILIRUBIN TOTAL: 0.7 mg/dL (ref 0.3–1.2)
Bilirubin, Direct: <0.1 mg/dL — ABNORMAL LOW (ref 0.1–0.5)
Total Protein: 7 g/dL (ref 6.5–8.1)

## 2016-06-13 NOTE — Patient Instructions (Signed)
MRCP scheduled for Jun 21, 2016 @ 12:30 @ [place]  Nothing to Eat or Drink 4 hrs prior Labs LFT ordered Follow up 2 months March 29,2018.

## 2016-06-13 NOTE — Progress Notes (Signed)
Primary Care Physician: Albina Billet, MD  Primary Gastroenterologist:  Dr. Jonathon Bellows   Chief Complaint  Patient presents with  . Follow-up    capsule study    HPI: Tracie Fisher is a 81 y.o. female . She is here today to follow up to her initial visit when she was seen in 03/2016 for iron deficiency anemia. At the same visit she had mentioned about abdominal pain , she had a dilated common bile duct seen at that time on imaging.   Summary of history : Receiving IV iron with oncology. Hb has improved from 9 grams to 11.9 grams in 1.2018 Celiac serology , H pylori stool , B12,folate are normal .  EGD and colonoscopy on 04/25/16 showed a large hiatal hernia, non obstructing schatzkis ring otherwise normal mucosa. Colonoscopy showed internal hemorroids, diverticlusis and 2 polyps which were excised and were adenomas. Repeat colonoscopy in 3 years suggested  Capsule study of the small bowel was also normal.  MRCP not performed although ordered at last visit .  Gained 3 kgs since last visit in 03/2016   Abdominal pain much better after starting omeprazole   Wt 141 lb (64 kg)   BMI 24.98 kg/m    Current Outpatient Prescriptions  Medication Sig Dispense Refill  . ALPRAZolam (XANAX) 1 MG tablet Take 1 mg by mouth 2 (two) times daily.     . cyanocobalamin (V-R VITAMIN B-12) 500 MCG tablet Take 500 mcg by mouth daily.     . DULoxetine (CYMBALTA) 30 MG capsule Take 30 mg by mouth daily.     Marland Kitchen levothyroxine (SYNTHROID, LEVOTHROID) 50 MCG tablet Take 50 mcg by mouth daily before breakfast.     . losartan (COZAAR) 50 MG tablet Take 50 mg by mouth daily.     . metoprolol tartrate (LOPRESSOR) 25 MG tablet Take 1 tablet by mouth 2 (two) times daily.    Marland Kitchen omeprazole (PRILOSEC) 40 MG capsule Take 1 capsule (40 mg total) by mouth daily. 30 capsule 5  . polyethylene glycol (GOLYTELY) 236 g solution Drink one 8 oz glass every 20 mins until stools are clear 4000 mL 0   No current  facility-administered medications for this visit.     Allergies as of 06/13/2016 - Review Complete 05/18/2016  Allergen Reaction Noted  . Aspirin Itching 02/11/2013  . Penicillin g Rash 07/29/2015    ROS:  General: Negative for anorexia, weight loss, fever, chills, fatigue, weakness. ENT: Negative for hoarseness, difficulty swallowing , nasal congestion. CV: Negative for chest pain, angina, palpitations, dyspnea on exertion, peripheral edema.  Respiratory: Negative for dyspnea at rest, dyspnea on exertion, cough, sputum, wheezing.  GI: See history of present illness. GU:  Negative for dysuria, hematuria, urinary incontinence, urinary frequency, nocturnal urination.  Endo: Negative for unusual weight change.    Physical Examination:   There were no vitals taken for this visit.  General: Well-nourished, well-developed in no acute distress.  Eyes: No icterus. Conjunctivae pink. Mouth: Oropharyngeal mucosa moist and pink , no lesions erythema or exudate. Lungs: Clear to auscultation bilaterally. Non-labored. Heart: Regular rate and rhythm, no murmurs rubs or gallops.  Abdomen: Bowel sounds are normal, nontender, nondistended, no hepatosplenomegaly or masses, no abdominal bruits or hernia , no rebound or guarding.   Extremities: No lower extremity edema. No clubbing or deformities. Neuro: Alert and oriented x 3.  Grossly intact. Skin: Warm and dry, no jaundice.   Psych: Alert and cooperative, normal mood and affect.   Imaging Studies:  Mm Screening Breast Tomo Bilateral  Result Date: 06/09/2016 CLINICAL DATA:  Screening. EXAM: 2D DIGITAL SCREENING BILATERAL MAMMOGRAM WITH CAD AND ADJUNCT TOMO COMPARISON:  Previous exam(s). ACR Breast Density Category b: There are scattered areas of fibroglandular density. FINDINGS: There are no findings suspicious for malignancy. Images were processed with CAD. IMPRESSION: No mammographic evidence of malignancy. A result letter of this screening  mammogram will be mailed directly to the patient. RECOMMENDATION: Screening mammogram in one year. (Code:SM-B-01Y) BI-RADS CATEGORY  1: Negative. Electronically Signed   By: Fidela Salisbury M.D.   On: 06/09/2016 18:37    Assessment and Plan:   Tracie Fisher is a 81 y.o. y/o female  here for follow up for  for iron deficiency anemia, abdominal pain .  1. Abdominal pain and  Dilated common bile duct. Abdominal pain is much better after starting PPI. Will obtain MRCP, LFT .   2.  Iron deficiency anemia: EGD+colonoscopy and  capsule study showed no GI source of bleeding . Possible etiologies are insufficienct oral intake vs decreased absorption vs related to hiatal hernia. Continue with IV iron .   F/u in 8-12  weeks   Dr Jonathon Bellows  Gastroenterology/Hepatology Pager: [phone]

## 2016-06-17 ENCOUNTER — Other Ambulatory Visit: Payer: Self-pay

## 2016-06-17 DIAGNOSIS — K838 Other specified diseases of biliary tract: Secondary | ICD-10-CM

## 2016-06-17 DIAGNOSIS — R1084 Generalized abdominal pain: Secondary | ICD-10-CM

## 2016-06-17 DIAGNOSIS — D509 Iron deficiency anemia, unspecified: Secondary | ICD-10-CM

## 2016-06-21 ENCOUNTER — Ambulatory Visit
Admission: RE | Admit: 2016-06-21 | Discharge: 2016-06-21 | Disposition: A | Discharge status: Home / Self Care | Payer: Medicare Other | Source: Ambulatory Visit | Attending: Gastroenterology | Admitting: Gastroenterology

## 2016-06-21 ENCOUNTER — Other Ambulatory Visit: Payer: Self-pay | Admitting: Gastroenterology

## 2016-06-21 DIAGNOSIS — Z9049 Acquired absence of other specified parts of digestive tract: Secondary | ICD-10-CM | POA: Diagnosis not present

## 2016-06-21 DIAGNOSIS — K838 Other specified diseases of biliary tract: Secondary | ICD-10-CM | POA: Diagnosis present

## 2016-06-21 DIAGNOSIS — R1084 Generalized abdominal pain: Secondary | ICD-10-CM

## 2016-06-21 DIAGNOSIS — D509 Iron deficiency anemia, unspecified: Secondary | ICD-10-CM

## 2016-06-21 DIAGNOSIS — K449 Diaphragmatic hernia without obstruction or gangrene: Secondary | ICD-10-CM | POA: Insufficient documentation

## 2016-06-21 LAB — POCT I-STAT CREATININE: Creatinine, Ser: 1 mg/dL (ref 0.44–1.00)

## 2016-06-22 ENCOUNTER — Other Ambulatory Visit: Payer: Self-pay | Admitting: Gastroenterology

## 2016-07-01 ENCOUNTER — Ambulatory Visit
Admission: RE | Admit: 2016-07-01 | Discharge: 2016-07-01 | Disposition: A | Discharge status: Home / Self Care | Payer: Medicare Other | Source: Ambulatory Visit | Attending: Gastroenterology | Admitting: Gastroenterology

## 2016-07-01 DIAGNOSIS — K449 Diaphragmatic hernia without obstruction or gangrene: Secondary | ICD-10-CM | POA: Insufficient documentation

## 2016-07-01 DIAGNOSIS — R1084 Generalized abdominal pain: Secondary | ICD-10-CM | POA: Insufficient documentation

## 2016-07-01 DIAGNOSIS — D509 Iron deficiency anemia, unspecified: Secondary | ICD-10-CM | POA: Insufficient documentation

## 2016-07-01 DIAGNOSIS — Z9049 Acquired absence of other specified parts of digestive tract: Secondary | ICD-10-CM | POA: Diagnosis not present

## 2016-07-01 DIAGNOSIS — K838 Other specified diseases of biliary tract: Secondary | ICD-10-CM | POA: Diagnosis present

## 2016-07-01 MED ORDER — GADOBENATE DIMEGLUMINE 529 MG/ML IV SOLN
15.0000 mL | Freq: Once | INTRAVENOUS | Status: AC | PRN
  Administered 2016-07-01: 13 mL via INTRAVENOUS

## 2016-07-07 ENCOUNTER — Telehealth: Payer: Self-pay

## 2016-07-07 NOTE — Telephone Encounter (Signed)
-----   Message from Jonathon Bellows, MD sent at 07/05/2016  9:49 AM EST ----- Normal bile ducts , large hiatal hernia

## 2016-07-07 NOTE — Telephone Encounter (Signed)
Pt notified of results

## 2016-07-13 ENCOUNTER — Ambulatory Visit (INDEPENDENT_AMBULATORY_CARE_PROVIDER_SITE_OTHER): Payer: Medicare Other | Admitting: Gastroenterology

## 2016-07-13 ENCOUNTER — Encounter: Payer: Self-pay | Admitting: Gastroenterology

## 2016-07-13 VITALS — BP 118/82 | HR 68 | Ht 63.0 in | Wt 141.8 lb

## 2016-07-13 DIAGNOSIS — R935 Abnormal findings on diagnostic imaging of other abdominal regions, including retroperitoneum: Secondary | ICD-10-CM | POA: Diagnosis not present

## 2016-07-13 NOTE — Progress Notes (Signed)
Primary Care Physician: Albina Billet, MD  Primary Gastroenterologist:  Dr. Jonathon Bellows   Chief Complaint  Patient presents with  . Follow-up    HPI: Tracie Fisher is a 81 y.o. female.  Summary of history   Tracie Fisher is a 81 y.o. female . She is here today to follow up to her visit on 06/13/16 for a dilated CBD and  abdominal pain , she had a dilated common bile duct seen at that time on imaging. Previously seen for iron deficiency anemia with work up below being negative for GI tract blood loss. Receiving IV iron with oncology. Hb has improved from 9 grams to 11.9 grams in 1.2018 Celiac serology , H pylori stool , B12,folate are normal .EGD and colonoscopy on 04/25/16 showed a large hiatal hernia, non obstructing schatzkis ring otherwise normal mucosa. Colonoscopy showed internal hemorroids, diverticlusis and 2 polyps which were excised and were adenomas. Repeat colonoscopy in 3 years suggested. Capsule study of the small bowel was also normal.   Interval history 05/2016-2018  No change of weight   LFT 06/13/16-normal  MRCP showed no biliary dilation . Large hiatal hernia  Abdominal pain much better after starting omeprazole . No other issues presently. She has had IV iron.    Current Outpatient Prescriptions  Medication Sig Dispense Refill  . ALPRAZolam (XANAX) 1 MG tablet Take 1 mg by mouth daily. Take 1/2 tablet in the morning & 1 tablet at bed time    . cyanocobalamin (V-R VITAMIN B-12) 500 MCG tablet Take 500 mcg by mouth daily.     . DULoxetine (CYMBALTA) 30 MG capsule Take 30 mg by mouth daily.     Marland Kitchen levothyroxine (SYNTHROID, LEVOTHROID) 50 MCG tablet Take 50 mcg by mouth daily before breakfast.     . losartan (COZAAR) 50 MG tablet Take 50 mg by mouth daily.     . metoprolol tartrate (LOPRESSOR) 25 MG tablet Take 1 tablet by mouth 2 (two) times daily.    Marland Kitchen omeprazole (PRILOSEC) 40 MG capsule Take 1 capsule (40 mg total) by mouth daily. 30 capsule 5   No current  facility-administered medications for this visit.     Allergies as of 07/13/2016 - Review Complete 07/13/2016  Allergen Reaction Noted  . Aspirin Itching 02/11/2013  . Penicillin g Rash 07/29/2015    ROS:  General: Negative for anorexia, weight loss, fever, chills, fatigue, weakness. ENT: Negative for hoarseness, difficulty swallowing , nasal congestion. CV: Negative for chest pain, angina, palpitations, dyspnea on exertion, peripheral edema.  Respiratory: Negative for dyspnea at rest, dyspnea on exertion, cough, sputum, wheezing.  GI: See history of present illness. GU:  Negative for dysuria, hematuria, urinary incontinence, urinary frequency, nocturnal urination.  Endo: Negative for unusual weight change.    Physical Examination:   BP 118/82   Pulse 68   Ht 5\' 3"  (1.6 m)   Wt 141 lb 12.8 oz (64.3 kg)   BMI 25.12 kg/m   General: Well-nourished, well-developed in no acute distress.  Eyes: No icterus. Conjunctivae pink. Mouth: Oropharyngeal mucosa moist and pink , no lesions erythema or exudate. Lungs: Clear to auscultation bilaterally. Non-labored. Heart: Regular rate and rhythm, no murmurs rubs or gallops.  Abdomen: Bowel sounds are normal, nontender, nondistended, no hepatosplenomegaly or masses, no abdominal bruits or hernia , no rebound or guarding.   Extremities: No lower extremity edema. No clubbing or deformities. Neuro: Alert and oriented x 3.  Grossly intact. Skin: Warm and dry, no jaundice.  Psych: Alert and cooperative, normal mood and affect.   Imaging Studies: Mr 3d Recon At Scanner  Result Date: 07/01/2016 CLINICAL DATA:  Evaluate common bile duct dilatation. EXAM: MRI ABDOMEN WITHOUT AND WITH CONTRAST (INCLUDING MRCP) TECHNIQUE: Multiplanar multisequence MR imaging of the abdomen was performed both before and after the administration of intravenous contrast. Heavily T2-weighted images of the biliary and pancreatic ducts were obtained, and three-dimensional  MRCP images were rendered by post processing. CONTRAST:  29mL MULTIHANCE GADOBENATE DIMEGLUMINE 529 MG/ML IV SOLN COMPARISON:  CT scan 03/31/2016 FINDINGS: Lower chest: The lung bases are grossly clear. No pulmonary lesions, pleural or pericardial effusion. There is a moderate to large hiatal hernia. Hepatobiliary: No focal hepatic lesions or intrahepatic biliary dilatation. The gallbladder is surgically absent. No common bile duct dilatation. No common bile duct stones. Pancreas: No mass, inflammation or ductal dilatation. Mild pancreatic atrophy. Spleen:  Normal size.  No focal lesions. Adrenals/Urinary Tract: The adrenal glands and kidneys are unremarkable. Simple appearing upper pole left renal cyst. Stomach/Bowel: Visualized portions within the abdomen are unremarkable. Vascular/Lymphatic: The aorta is normal in caliber. Advanced atherosclerotic changes. No definite aneurysm or dissection. No mesenteric or retroperitoneal mass or adenopathy. Other:  No ascites or abdominal wall hernia. Musculoskeletal: Significant artifact related to lumbar fusion hardware. IMPRESSION: 1. Status post cholecystectomy but no intra or extrahepatic biliary dilatation. Normal caliber and course of the common bile duct for the patient's age and prior cholecystectomy. No common bile duct stones. 2. No acute abdominal findings, mass lesions or adenopathy. 3. Moderate to large hiatal hernia. Electronically Signed   By: Marijo Sanes M.D.   On: 07/01/2016 10:12   Mr Abdomen Mrcp Moise Boring Contast  Result Date: 07/01/2016 CLINICAL DATA:  Evaluate common bile duct dilatation. EXAM: MRI ABDOMEN WITHOUT AND WITH CONTRAST (INCLUDING MRCP) TECHNIQUE: Multiplanar multisequence MR imaging of the abdomen was performed both before and after the administration of intravenous contrast. Heavily T2-weighted images of the biliary and pancreatic ducts were obtained, and three-dimensional MRCP images were rendered by post processing. CONTRAST:  1mL  MULTIHANCE GADOBENATE DIMEGLUMINE 529 MG/ML IV SOLN COMPARISON:  CT scan 03/31/2016 FINDINGS: Lower chest: The lung bases are grossly clear. No pulmonary lesions, pleural or pericardial effusion. There is a moderate to large hiatal hernia. Hepatobiliary: No focal hepatic lesions or intrahepatic biliary dilatation. The gallbladder is surgically absent. No common bile duct dilatation. No common bile duct stones. Pancreas: No mass, inflammation or ductal dilatation. Mild pancreatic atrophy. Spleen:  Normal size.  No focal lesions. Adrenals/Urinary Tract: The adrenal glands and kidneys are unremarkable. Simple appearing upper pole left renal cyst. Stomach/Bowel: Visualized portions within the abdomen are unremarkable. Vascular/Lymphatic: The aorta is normal in caliber. Advanced atherosclerotic changes. No definite aneurysm or dissection. No mesenteric or retroperitoneal mass or adenopathy. Other:  No ascites or abdominal wall hernia. Musculoskeletal: Significant artifact related to lumbar fusion hardware. IMPRESSION: 1. Status post cholecystectomy but no intra or extrahepatic biliary dilatation. Normal caliber and course of the common bile duct for the patient's age and prior cholecystectomy. No common bile duct stones. 2. No acute abdominal findings, mass lesions or adenopathy. 3. Moderate to large hiatal hernia. Electronically Signed   By: Marijo Sanes M.D.   On: 07/01/2016 10:12    Assessment and Plan:   Tracie Fisher is a 81 y.o. y/o female  here for follow up for  for iron deficiency anemia, abdominal pain .  1. Abdominal pain and large hiatal hernia . Abdominal pain is  much better after starting PPI.  MRCP, LFT were normal - no further evaluation needed  .   2.  Iron deficiency anemia:EGD+colonoscopy and  capsule study showed no GI source of bleeding . Possible etiologies are insufficienct oral intake vs decreased absorption vs related to hiatal hernia. Continue with IV iron .    Dr Jonathon Bellows   MD F/u in 6 months

## 2016-08-10 ENCOUNTER — Ambulatory Visit: Payer: Self-pay | Admitting: Gastroenterology

## 2016-09-02 DIAGNOSIS — I10 Essential (primary) hypertension: Secondary | ICD-10-CM | POA: Insufficient documentation

## 2016-09-27 DIAGNOSIS — Z85828 Personal history of other malignant neoplasm of skin: Secondary | ICD-10-CM | POA: Insufficient documentation

## 2016-10-20 ENCOUNTER — Encounter: Payer: Self-pay | Admitting: General Surgery

## 2016-10-20 ENCOUNTER — Ambulatory Visit (INDEPENDENT_AMBULATORY_CARE_PROVIDER_SITE_OTHER): Payer: Medicare Other | Admitting: General Surgery

## 2016-10-20 VITALS — BP 132/82 | HR 70 | Resp 12 | Ht 61.0 in | Wt 141.0 lb

## 2016-10-20 DIAGNOSIS — K449 Diaphragmatic hernia without obstruction or gangrene: Secondary | ICD-10-CM

## 2016-10-20 DIAGNOSIS — R1084 Generalized abdominal pain: Secondary | ICD-10-CM | POA: Diagnosis not present

## 2016-10-20 NOTE — Progress Notes (Signed)
Patient ID: Tracie Fisher, female   DOB: [birth year], 81 y.o.   MRN: [id number]  Chief Complaint  Patient presents with  . Hernia    HPI Tracie Fisher is a 81 y.o. female.  Patient here today for an evaluation of a hernia referred by Dr Ubaldo Glassing.  She states that she noticed it since last year. They told her it was a hernia by the CT 03-31-16.   She does seem to have some right abdominal pain when a knot comes out then it goes back in. She states it comes out under the right rib area.  She does admit to bloating. She does admit to occasional irregular heart beat on occassion. She does admit to left abdominal pain for 7 years. The patient denies any difficulty with swallowing or significant reflux symptoms. She was anemic as well and had to have iron infusions last year. No nausea, vomiting, constipation or diarrhea noted.  HPI  Past Medical History:  Diagnosis Date  . Depression   . Hiatal hernia   . Hypertension   . Thyroid disease     Past Surgical History:  Procedure Laterality Date  . BREAST CYST ASPIRATION Left "years ago"  . COLONOSCOPY WITH PROPOFOL N/A 04/25/2016   Procedure: COLONOSCOPY WITH PROPOFOL;  Surgeon: Jonathon Bellows, MD;  Location: [facility] ENDOSCOPY;  Service: Endoscopy;  Laterality: N/A;  . ESOPHAGOGASTRODUODENOSCOPY (EGD) WITH PROPOFOL N/A 04/25/2016   Procedure: ESOPHAGOGASTRODUODENOSCOPY (EGD) WITH PROPOFOL;  Surgeon: Jonathon Bellows, MD;  Location: [facility] ENDOSCOPY;  Service: Endoscopy;  Laterality: N/A;  . EXPLORATORY LAPAROTOMY  10/18/2011   blockage small intestines  . GIVENS CAPSULE STUDY N/A 05/11/2016   Procedure: GIVENS CAPSULE STUDY;  Surgeon: Jonathon Bellows, MD;  Location: [facility] ENDOSCOPY;  Service: Endoscopy;  Laterality: N/A;    Family History  Problem Relation Age of Onset  . Breast cancer Sister 23  . Heart attack Brother   . Heart disease Father     Social History Social History  Substance Use Topics  . Smoking status: Former Smoker    Years: 20.00  .  Smokeless tobacco: Never Used  . Alcohol use Yes     Comment: 1 beer /day    Allergies  Allergen Reactions  . Aspirin Itching  . Penicillin G Rash    Current Outpatient Prescriptions  Medication Sig Dispense Refill  . ALPRAZolam (XANAX) 1 MG tablet Take 1 mg by mouth daily. Take 1/2 tablet in the morning & 1 tablet at bed time    . cyanocobalamin (V-R VITAMIN B-12) 500 MCG tablet Take 500 mcg by mouth daily.     . furosemide (LASIX) 20 MG tablet Take 20 mg by mouth daily.    Marland Kitchen levothyroxine (SYNTHROID, LEVOTHROID) 50 MCG tablet Take 50 mcg by mouth daily before breakfast.     . losartan (COZAAR) 50 MG tablet Take 50 mg by mouth daily.     . metoprolol tartrate (LOPRESSOR) 25 MG tablet Take 1 tablet by mouth 2 (two) times daily.    Marland Kitchen omeprazole (PRILOSEC) 40 MG capsule Take 1 capsule (40 mg total) by mouth daily. 30 capsule 5   No current facility-administered medications for this visit.     Review of Systems Review of Systems  Constitutional: Negative.   Respiratory: Negative.   Cardiovascular: Negative.   Gastrointestinal: Negative for constipation, diarrhea, nausea and vomiting.    Blood pressure 132/82, pulse 70, resp. rate 12, height 5\' 1"  (1.549 m), weight 141 lb (64 kg).  Physical Exam Physical Exam  Constitutional: She is oriented to person, place, and time. She appears well-developed and well-nourished.  HENT:  Mouth/Throat: Oropharynx is clear and moist.  Eyes: Conjunctivae are normal. No scleral icterus.  Neck: Neck supple.  Cardiovascular: Normal rate, regular rhythm and normal heart sounds.   Pulses:      Femoral pulses are 2+ on the right side, and 2+ on the left side.      Popliteal pulses are 2+ on the right side, and 2+ on the left side.       Dorsalis pedis pulses are 2+ on the right side, and 2+ on the left side.       Posterior tibial pulses are 2+ on the right side, and 2+ on the left side.  No lower extremity edema  Pulmonary/Chest: Effort normal  and breath sounds normal.  Abdominal: Soft. Normal appearance and bowel sounds are normal.  Lymphadenopathy:    She has no cervical adenopathy.  Neurological: She is alert and oriented to person, place, and time.  Skin: Skin is warm and dry.  Psychiatric: Her behavior is normal.    Data Reviewed 03/31/2016 abdomen/pelvic CT reviewed. Moderate/large hiatal hernia. Careful review of the anterior abdominal wall shows no defects. GI evaluation of 04/12/2016 reviewed. Upper endoscopy report of 04/25/2016 reviewed. No evidence of gastritis in the herniated portion of the stomach. Normal capsule endoscopy. Unremarkable colonoscopy.  Assessment    Asymptomatic hiatal hernia for which no surgical intervention is recommended.   No evidence of anterior abdominal wall defect.  Chronic left upper quadrant abdominal pain without clear etiology.   Plan         Follow up as needed. The patient is aware to call back for any questions or new concerns.   HPI, Physical Exam, Assessment and Plan have been scribed under the direction and in the presence of Robert Bellow, MD.  Karie Fetch, RN  I have completed the exam and reviewed the above documentation for accuracy and completeness.  I agree with the above.  Haematologist has been used and any errors in dictation or transcription are unintentional.  Hervey Ard, M.D., F.A.C.S.    Robert Bellow 10/22/2016, 7:48 AM

## 2016-10-20 NOTE — Patient Instructions (Signed)
The patient is aware to call back for any questions or concerns.  

## 2017-01-10 ENCOUNTER — Encounter: Payer: Self-pay | Admitting: Gastroenterology

## 2017-01-10 ENCOUNTER — Ambulatory Visit (INDEPENDENT_AMBULATORY_CARE_PROVIDER_SITE_OTHER): Payer: Medicare Other | Admitting: Gastroenterology

## 2017-01-10 ENCOUNTER — Encounter (INDEPENDENT_AMBULATORY_CARE_PROVIDER_SITE_OTHER): Payer: Self-pay

## 2017-01-10 VITALS — BP 149/85 | HR 71 | Temp 98.1°F | Ht 61.0 in | Wt 140.4 lb

## 2017-01-10 DIAGNOSIS — K30 Functional dyspepsia: Secondary | ICD-10-CM

## 2017-01-10 NOTE — Progress Notes (Signed)
Jonathon Bellows MD, MRCP(U.K) [address]  [place]  [place], [place] [address]  Main: [phone]  Fax: [phone]   Primary Care Physician: Albina Billet, MD  Primary Gastroenterologist:  Dr. Jonathon Bellows   Chief Complaint  Patient presents with  . Abdominal Pain    HPI: Tracie Fisher is a 81 y.o. female    Summary of history  Tracie Fisher a 81 y.o.female. She is here today to follow up to her visit on 2/18 for a dilated CBD and  abdominal pain , she had a dilated common bile duct seen at that time on imaging. Previously seen for iron deficiency anemia with work up  negative for GI tract blood loss. Receiving IV iron with oncology. Hb has improved from 9 grams to 11.9 grams in 1.2018 Celiac serology , H pylori stool , B12,folate are normal .EGD and colonoscopy on 04/25/16 showed a large hiatal hernia, non obstructing schatzkis ring otherwise normal mucosa. Colonoscopy showed internal hemorroids, diverticlusis and 2 polyps which were excised and were adenomas. Repeat colonoscopy in 3 years suggested. Capsule study of the small bowel was also normal. MRCP showed no biliary dilation . Large hiatal hernia   Interval history 06/2016-12/2016    Says she finished her IV iron , still has left sided abdominal which she has all day long for many years, better at night when she falls asleep , pain no worse when she eats. No change in weight. She has a bowel movement every day , does not relieve the pain. Omperazole previously helped but says does not help, last week was changed to dexilant which did not help either. Stress may make the pain worse she states.   Current Outpatient Prescriptions  Medication Sig Dispense Refill  . ALPRAZolam (XANAX) 1 MG tablet Take 1 mg by mouth daily. Take 1/2 tablet in the morning & 1 tablet at bed time    . cyanocobalamin (V-R VITAMIN B-12) 500 MCG tablet Take 500 mcg by mouth daily.     Marland Kitchen dexlansoprazole (DEXILANT) 60 MG capsule Take 60 mg  by mouth daily.    Marland Kitchen levothyroxine (SYNTHROID, LEVOTHROID) 50 MCG tablet Take 50 mcg by mouth daily before breakfast.     . losartan (COZAAR) 50 MG tablet Take 50 mg by mouth daily.     . metoprolol tartrate (LOPRESSOR) 25 MG tablet Take 1 tablet by mouth 2 (two) times daily.    Marland Kitchen omeprazole (PRILOSEC) 40 MG capsule Take 1 capsule (40 mg total) by mouth daily. 30 capsule 5   No current facility-administered medications for this visit.     Allergies as of 01/10/2017 - Review Complete 01/10/2017  Allergen Reaction Noted  . Aspirin Itching 02/11/2013  . Penicillin g Rash 07/29/2015    ROS:  General: Negative for anorexia, weight loss, fever, chills, fatigue, weakness. ENT: Negative for hoarseness, difficulty swallowing , nasal congestion. CV: Negative for chest pain, angina, palpitations, dyspnea on exertion, peripheral edema.  Respiratory: Negative for dyspnea at rest, dyspnea on exertion, cough, sputum, wheezing.  GI: See history of present illness. GU:  Negative for dysuria, hematuria, urinary incontinence, urinary frequency, nocturnal urination.  Endo: Negative for unusual weight change.    Physical Examination:   BP (!) 149/85 (BP Location: Left Arm, Patient Position: Sitting, Cuff Size: Normal)   Pulse 71   Temp 98.1 F (36.7 C) (Oral)   Ht 5\' 1"  (1.549 m)   Wt 140 lb 6.4 oz (63.7 kg)   BMI 26.53  kg/m   General: Well-nourished, well-developed in no acute distress.  Eyes: No icterus. Conjunctivae pink. Mouth: Oropharyngeal mucosa moist and pink , no lesions erythema or exudate. Lungs: Clear to auscultation bilaterally. Non-labored. Heart: Regular rate and rhythm, no murmurs rubs or gallops.  Abdomen: Bowel sounds are normal,minimal tenderness LUQ, nondistended, no hepatosplenomegaly or masses, no abdominal bruits or hernia , no rebound or guarding.   Extremities: No lower extremity edema. No clubbing or deformities. Neuro: Alert and oriented x 3.  Grossly intact. Skin:  Warm and dry, no jaundice.   Psych: Alert and cooperative, normal mood and affect.   Imaging Studies: No results found.  Assessment and Plan:   HEATH BADON is a 81 y.o. y/o female here for follow up for for iron deficiency anemia, abdominal pain .  1. Abdominal pain and large hiatal hernia .Abdominal pain is back since last visit- appears it is of functional in nature- trial of IB guard, samples provided .  MRCP, LFT were normal - no further evaluation needed  .   2. Iron deficiency anemia:EGD+colonoscopy and capsule study showed no GI source of bleeding . Possible etiologies are insufficienct oral intake vs decreased absorption vs related to hiatal hernia. She follows with Dr Hall Busing who she says is going to do a CBC next week   Dr Jonathon Bellows  MD,MRCP [facility]) Follow up in 3 months

## 2017-01-18 ENCOUNTER — Telehealth: Payer: Self-pay | Admitting: Gastroenterology

## 2017-01-18 NOTE — Telephone Encounter (Signed)
Dr Vicente Males gave patient Tracie Fisher and she stated it isn't working for her. She was up all night in pain. What else can she use? Please call

## 2017-01-19 ENCOUNTER — Telehealth: Payer: Self-pay | Admitting: Gastroenterology

## 2017-01-19 NOTE — Telephone Encounter (Signed)
Patient called regarding abd pain, please call asap.

## 2017-01-19 NOTE — Telephone Encounter (Signed)
Patient called to let us know that IB Guard is not working for her pain she is experiencing on her left side.  She said she has had this pain now going on 3 years.  Please advise. Thanks [employer]

## 2017-01-20 ENCOUNTER — Telehealth: Payer: Self-pay | Admitting: Gastroenterology

## 2017-01-20 NOTE — Telephone Encounter (Signed)
Patient LVM and is not happy. D Guard in not working and still having abdominal pain and needs something else. Please call patient.

## 2017-01-22 NOTE — Telephone Encounter (Signed)
Check how often she taking IB guard, when was last bowel movement

## 2017-01-23 ENCOUNTER — Other Ambulatory Visit: Payer: Self-pay

## 2017-01-23 DIAGNOSIS — R1013 Epigastric pain: Secondary | ICD-10-CM

## 2017-01-23 MED ORDER — DICYCLOMINE HCL 10 MG PO CAPS: 10.0000 mg | ORAL_CAPSULE | Freq: Three times a day (TID) | ORAL | 0 refills | Status: DC

## 2017-01-25 ENCOUNTER — Other Ambulatory Visit: Payer: Self-pay | Admitting: Internal Medicine

## 2017-01-25 DIAGNOSIS — Z780 Asymptomatic menopausal state: Secondary | ICD-10-CM

## 2017-02-10 ENCOUNTER — Other Ambulatory Visit: Payer: Self-pay | Admitting: Gastroenterology

## 2017-02-10 ENCOUNTER — Other Ambulatory Visit: Payer: Self-pay

## 2017-02-10 ENCOUNTER — Telehealth: Payer: Self-pay | Admitting: Gastroenterology

## 2017-02-10 DIAGNOSIS — R1013 Epigastric pain: Secondary | ICD-10-CM

## 2017-02-10 MED ORDER — DICYCLOMINE HCL 10 MG PO CAPS: 10.0000 mg | ORAL_CAPSULE | Freq: Three times a day (TID) | ORAL | 0 refills | Status: DC

## 2017-02-10 NOTE — Telephone Encounter (Signed)
*  STAT* If patient is at the pharmacy, call can be transferred to refill team.   1. Which medications need to be refilled? (please list name of each medication and dose if known) Dicyclomine 10 mg  2. Which pharmacy/location (including street and city if local pharmacy) is medication to be sent to? [facility]  3. Do they need a 30 day or 90 day supply? 30   Patient states medicine is helping but still having problems.

## 2017-02-15 ENCOUNTER — Ambulatory Visit
Admission: RE | Admit: 2017-02-15 | Discharge: 2017-02-15 | Disposition: A | Discharge status: Home / Self Care | Payer: Medicare Other | Source: Ambulatory Visit | Attending: Internal Medicine | Admitting: Internal Medicine

## 2017-02-15 DIAGNOSIS — Z780 Asymptomatic menopausal state: Secondary | ICD-10-CM | POA: Diagnosis present

## 2017-02-15 DIAGNOSIS — M8580 Other specified disorders of bone density and structure, unspecified site: Secondary | ICD-10-CM | POA: Insufficient documentation

## 2017-02-15 DIAGNOSIS — R891 Abnormal level of hormones in specimens from other organs, systems and tissues: Secondary | ICD-10-CM | POA: Insufficient documentation

## 2017-02-17 ENCOUNTER — Telehealth: Payer: Self-pay

## 2017-02-17 NOTE — Telephone Encounter (Signed)
LVm for patient callback.   Verifying that patient received her medication refill.

## 2017-02-25 ENCOUNTER — Other Ambulatory Visit: Payer: Self-pay | Admitting: Gastroenterology

## 2017-02-25 DIAGNOSIS — R1013 Epigastric pain: Secondary | ICD-10-CM

## 2017-03-07 ENCOUNTER — Other Ambulatory Visit: Payer: Medicare Other

## 2017-03-09 ENCOUNTER — Telehealth: Payer: Self-pay | Admitting: Gastroenterology

## 2017-03-09 NOTE — Telephone Encounter (Signed)
Patient left a voice message that she will need a refill on the medication that Dr. Vicente Males gave her. She didn't say which medication it was. Please call

## 2017-03-16 ENCOUNTER — Encounter: Payer: Self-pay | Admitting: *Deleted

## 2017-03-21 ENCOUNTER — Ambulatory Visit: Payer: Medicare Other | Admitting: Certified Registered"

## 2017-03-21 ENCOUNTER — Ambulatory Visit
Admission: RE | Admit: 2017-03-21 | Discharge: 2017-03-21 | Disposition: A | Discharge status: Home / Self Care | Payer: Medicare Other | Source: Ambulatory Visit | Attending: Ophthalmology | Admitting: Ophthalmology

## 2017-03-21 ENCOUNTER — Encounter
Admission: RE | Disposition: A | Discharge status: Home / Self Care | Payer: Self-pay | Source: Ambulatory Visit | Attending: Ophthalmology

## 2017-03-21 ENCOUNTER — Encounter: Payer: Self-pay | Admitting: Emergency Medicine

## 2017-03-21 DIAGNOSIS — K219 Gastro-esophageal reflux disease without esophagitis: Secondary | ICD-10-CM | POA: Diagnosis not present

## 2017-03-21 DIAGNOSIS — Z85828 Personal history of other malignant neoplasm of skin: Secondary | ICD-10-CM | POA: Diagnosis not present

## 2017-03-21 DIAGNOSIS — H2512 Age-related nuclear cataract, left eye: Secondary | ICD-10-CM | POA: Diagnosis present

## 2017-03-21 DIAGNOSIS — E039 Hypothyroidism, unspecified: Secondary | ICD-10-CM | POA: Insufficient documentation

## 2017-03-21 DIAGNOSIS — I10 Essential (primary) hypertension: Secondary | ICD-10-CM | POA: Diagnosis not present

## 2017-03-21 DIAGNOSIS — Z880 Allergy status to penicillin: Secondary | ICD-10-CM | POA: Diagnosis not present

## 2017-03-21 DIAGNOSIS — Z886 Allergy status to analgesic agent status: Secondary | ICD-10-CM | POA: Diagnosis not present

## 2017-03-21 DIAGNOSIS — Z87891 Personal history of nicotine dependence: Secondary | ICD-10-CM | POA: Insufficient documentation

## 2017-03-21 DIAGNOSIS — F329 Major depressive disorder, single episode, unspecified: Secondary | ICD-10-CM | POA: Insufficient documentation

## 2017-03-21 DIAGNOSIS — Z79899 Other long term (current) drug therapy: Secondary | ICD-10-CM | POA: Insufficient documentation

## 2017-03-21 HISTORY — DX: Other complications of anesthesia, initial encounter: T88.59XA

## 2017-03-21 HISTORY — DX: Unspecified hearing loss, unspecified ear: H91.90

## 2017-03-21 HISTORY — DX: Gastro-esophageal reflux disease without esophagitis: K21.9

## 2017-03-21 HISTORY — DX: Adverse effect of unspecified anesthetic, initial encounter: T41.45XA

## 2017-03-21 HISTORY — DX: Hypothyroidism, unspecified: E03.9

## 2017-03-21 HISTORY — DX: Nausea with vomiting, unspecified: R11.2

## 2017-03-21 HISTORY — DX: Malignant (primary) neoplasm, unspecified: C80.1

## 2017-03-21 HISTORY — DX: Nausea with vomiting, unspecified: Z98.890

## 2017-03-21 HISTORY — DX: Cardiac arrhythmia, unspecified: I49.9

## 2017-03-21 HISTORY — PX: CATARACT EXTRACTION W/PHACO: SHX586

## 2017-03-21 SURGERY — PHACOEMULSIFICATION, CATARACT, WITH IOL INSERTION
Anesthesia: Monitor Anesthesia Care | Site: Eye | Laterality: Left | Wound class: Clean

## 2017-03-21 MED ORDER — FENTANYL CITRATE (PF) 100 MCG/2ML IJ SOLN
INTRAMUSCULAR | Status: DC | PRN
  Administered 2017-03-21 (×3): 25 ug via INTRAVENOUS

## 2017-03-21 MED ORDER — MIDAZOLAM HCL 2 MG/2ML IJ SOLN
INTRAMUSCULAR | Status: DC | PRN
  Administered 2017-03-21: 1 mg via INTRAVENOUS

## 2017-03-21 MED ORDER — MOXIFLOXACIN HCL 0.5 % OP SOLN
OPHTHALMIC | Status: AC
  Filled 2017-03-21: qty 3

## 2017-03-21 MED ORDER — POVIDONE-IODINE 5 % OP SOLN
OPHTHALMIC | Status: AC
  Filled 2017-03-21: qty 30

## 2017-03-21 MED ORDER — MOXIFLOXACIN HCL 0.5 % OP SOLN: 1.0000 [drp] | OPHTHALMIC | Status: DC | PRN

## 2017-03-21 MED ORDER — CARBACHOL 0.01 % IO SOLN
INTRAOCULAR | Status: DC | PRN
  Administered 2017-03-21: .5 mL via INTRAOCULAR

## 2017-03-21 MED ORDER — LIDOCAINE HCL (PF) 4 % IJ SOLN
INTRAMUSCULAR | Status: AC
  Filled 2017-03-21: qty 5

## 2017-03-21 MED ORDER — NA CHONDROIT SULF-NA HYALURON 40-17 MG/ML IO SOLN
INTRAOCULAR | Status: DC | PRN
  Administered 2017-03-21: 1 mL via INTRAOCULAR

## 2017-03-21 MED ORDER — MIDAZOLAM HCL 2 MG/2ML IJ SOLN
INTRAMUSCULAR | Status: AC
  Filled 2017-03-21: qty 2

## 2017-03-21 MED ORDER — LIDOCAINE HCL (PF) 4 % IJ SOLN
INTRAOCULAR | Status: DC | PRN
  Administered 2017-03-21: 2 mL via OPHTHALMIC

## 2017-03-21 MED ORDER — POVIDONE-IODINE 5 % OP SOLN
OPHTHALMIC | Status: DC | PRN
  Administered 2017-03-21: 1 via OPHTHALMIC

## 2017-03-21 MED ORDER — FENTANYL CITRATE (PF) 100 MCG/2ML IJ SOLN
INTRAMUSCULAR | Status: AC
  Filled 2017-03-21: qty 2

## 2017-03-21 MED ORDER — EPINEPHRINE PF 1 MG/ML IJ SOLN
INTRAMUSCULAR | Status: DC | PRN
  Administered 2017-03-21: 1 mL via OPHTHALMIC

## 2017-03-21 MED ORDER — MOXIFLOXACIN HCL 0.5 % OP SOLN
OPHTHALMIC | Status: DC | PRN
  Administered 2017-03-21: .2 mL via OPHTHALMIC

## 2017-03-21 MED ORDER — SODIUM CHLORIDE 0.9 % IV SOLN
INTRAVENOUS | Status: DC
  Administered 2017-03-21: 08:00:00 via INTRAVENOUS

## 2017-03-21 MED ORDER — ARMC OPHTHALMIC DILATING DROPS
OPHTHALMIC | Status: AC
  Administered 2017-03-21: 1 via OPHTHALMIC
  Filled 2017-03-21: qty 0.4

## 2017-03-21 MED ORDER — ARMC OPHTHALMIC DILATING DROPS
1.0000 "application " | OPHTHALMIC | Status: AC
  Administered 2017-03-21 (×3): 1 via OPHTHALMIC

## 2017-03-21 MED ORDER — EPINEPHRINE PF 1 MG/ML IJ SOLN
INTRAMUSCULAR | Status: AC
  Filled 2017-03-21: qty 1

## 2017-03-21 MED ORDER — NA CHONDROIT SULF-NA HYALURON 40-17 MG/ML IO SOLN
INTRAOCULAR | Status: AC
  Filled 2017-03-21: qty 1

## 2017-03-21 SURGICAL SUPPLY — 16 items
GLOVE BIO SURGEON STRL SZ8 (GLOVE) ×2 IMPLANT
GLOVE BIOGEL M 6.5 STRL (GLOVE) ×2 IMPLANT
GLOVE SURG LX 8.0 MICRO (GLOVE) ×1
GLOVE SURG LX STRL 8.0 MICRO (GLOVE) ×1 IMPLANT
GOWN STRL REUS W/ TWL LRG LVL3 (GOWN DISPOSABLE) ×2 IMPLANT
GOWN STRL REUS W/TWL LRG LVL3 (GOWN DISPOSABLE) ×2
LABEL CATARACT MEDS ST (LABEL) ×2 IMPLANT
LENS IOL TECNIS ITEC 21.5 (Intraocular Lens) ×2 IMPLANT
PACK CATARACT (MISCELLANEOUS) ×2 IMPLANT
PACK CATARACT BRASINGTON LX (MISCELLANEOUS) ×2 IMPLANT
PACK EYE AFTER SURG (MISCELLANEOUS) ×2 IMPLANT
SOL BSS BAG (MISCELLANEOUS) ×2
SOLUTION BSS BAG (MISCELLANEOUS) ×1 IMPLANT
SYR 5ML LL (SYRINGE) ×2 IMPLANT
WATER STERILE IRR 250ML POUR (IV SOLUTION) ×2 IMPLANT
WIPE NON LINTING 3.25X3.25 (MISCELLANEOUS) ×2 IMPLANT

## 2017-03-21 NOTE — Discharge Instructions (Signed)
Eye Surgery Discharge Instructions  Expect mild scratchy sensation or mild soreness. DO NOT RUB YOUR EYE!  The day of surgery:  Minimal physical activity, but bed rest is not required  No reading, computer work, or close hand work  No bending, lifting, or straining.  May watch TV  For 24 hours:  No driving, legal decisions, or alcoholic beverages  Safety precautions  Eat anything you prefer: It is better to start with liquids, then soup then solid foods.  _____ Eye patch should be worn until postoperative exam tomorrow.  ____ Solar shield eyeglasses should be worn for comfort in the sunlight/patch while sleeping  Resume all regular medications including aspirin or Coumadin if these were discontinued prior to surgery. You may shower, bathe, shave, or wash your hair. Tylenol may be taken for mild discomfort.  Call your doctor if you experience significant pain, nausea, or vomiting, fever > 101 or other signs of infection. [phone] or [phone] Specific instructions:  Follow-up Information    Birder Robson, MD Follow up.   Specialty:  Ophthalmology Why:  November 7 at 8:30am Contact information: [address] [place] [address] [phone]

## 2017-03-21 NOTE — Transfer of Care (Signed)
Immediate Anesthesia Transfer of Care Note  Patient: Tracie Fisher  Procedure(s) Performed: CATARACT EXTRACTION PHACO AND INTRAOCULAR LENS PLACEMENT (IOC) (Left Eye)  Patient Location: PACU  Anesthesia Type:MAC  Level of Consciousness: awake, alert  and oriented  Airway & Oxygen Therapy: Patient Spontanous Breathing  Post-op Assessment: Report given to RN and Post -op Vital signs reviewed and stable  Post vital signs: Reviewed and stable  Last Vitals:  Vitals:   03/21/17 0734 03/21/17 0909  BP: (!) 185/78 (!) 182/72  Pulse: 61 (!) 57  Resp: 17 13  Temp: 36.5 C   SpO2: 99% 98%    Last Pain:  Vitals:   03/21/17 0734  TempSrc: Oral         Complications: No apparent anesthesia complications

## 2017-03-21 NOTE — H&P (Signed)
All labs reviewed. Abnormal studies sent to patients PCP when indicated.  Previous H&P reviewed, patient examined, there are NO CHANGES.  Tracie Dibiasio LOUIS11/6/20188:44 AM

## 2017-03-21 NOTE — Anesthesia Postprocedure Evaluation (Signed)
Anesthesia Post Note  Patient: Tracie Fisher  Procedure(s) Performed: CATARACT EXTRACTION PHACO AND INTRAOCULAR LENS PLACEMENT ([place]) (Left Eye)  Patient location during evaluation: PACU Anesthesia Type: MAC Level of consciousness: awake and alert and oriented Pain management: pain level controlled Vital Signs Assessment: post-procedure vital signs reviewed and stable Respiratory status: spontaneous breathing, nonlabored ventilation and respiratory function stable Cardiovascular status: blood pressure returned to baseline and stable Postop Assessment: no signs of nausea or vomiting Anesthetic complications: no     Last Vitals:  Vitals:   03/21/17 0909 03/21/17 0913  BP: (!) 182/72 (!) 167/79  Pulse: (!) 57   Resp: 13   Temp:    SpO2: 98%     Last Pain:  Vitals:   03/21/17 0734  TempSrc: Oral                 Kimori Tartaglia

## 2017-03-21 NOTE — Op Note (Signed)
PREOPERATIVE DIAGNOSIS:  Nuclear sclerotic cataract of the left eye.   POSTOPERATIVE DIAGNOSIS:  Nuclear sclerotic cataract of the left eye.   OPERATIVE PROCEDURE: Procedure(s): CATARACT EXTRACTION PHACO AND INTRAOCULAR LENS PLACEMENT (IOC)   SURGEON:  Birder Robson, MD.   ANESTHESIA:  Anesthesiologist: Emmie Niemann, MD CRNA: Lance Muss, CRNA; Johnna Acosta, CRNA  1.      Managed anesthesia care. 2.     0.40ml of Shugarcaine was instilled following the paracentesis   COMPLICATIONS:  None.   TECHNIQUE:   Stop and chop   DESCRIPTION OF PROCEDURE:  The patient was examined and consented in the preoperative holding area where the aforementioned topical anesthesia was applied to the left eye and then brought back to the Operating Room where the left eye was prepped and draped in the usual sterile ophthalmic fashion and a lid speculum was placed. A paracentesis was created with the side port blade and the anterior chamber was filled with viscoelastic. A near clear corneal incision was performed with the steel keratome. A continuous curvilinear capsulorrhexis was performed with a cystotome followed by the capsulorrhexis forceps. Hydrodissection and hydrodelineation were carried out with BSS on a blunt cannula. The lens was removed in a stop and chop  technique and the remaining cortical material was removed with the irrigation-aspiration handpiece. The capsular bag was inflated with viscoelastic and the Technis [id number] lens was placed in the capsular bag without complication. The remaining viscoelastic was removed from the eye with the irrigation-aspiration handpiece. The wounds were hydrated. The anterior chamber was flushed with Miostat and the eye was inflated to physiologic pressure. 0.4ml Vigamox was placed in the anterior chamber. The wounds were found to be water tight. The eye was dressed with Vigamox. The patient was given protective glasses to wear throughout the day and a shield  with which to sleep tonight. The patient was also given drops with which to begin a drop regimen today and will follow-up with me in one day. Implant Name Type Inv. Item Serial No. Manufacturer Lot No. LRB No. Used  LENS IOL DIOP 21.5 - [id number] Intraocular Lens LENS IOL DIOP 21.5 [id number]  Left 1    Procedure(s) with comments: CATARACT EXTRACTION PHACO AND INTRAOCULAR LENS PLACEMENT (IOC) (Left) - [place] 00:48 AP% 17.8 CDE 8.57 Fluid pack lot # [id number]  Electronically signed: [place] 03/21/2017 9:08 AM

## 2017-03-21 NOTE — Anesthesia Procedure Notes (Signed)
Procedure Name: MAC Date/Time: 03/21/2017 8:50 AM Performed by: Johnna Acosta, CRNA Pre-anesthesia Checklist: Patient identified, Emergency Drugs available, Suction available, Patient being monitored and Timeout performed Patient Re-evaluated:Patient Re-evaluated prior to induction Oxygen Delivery Method: Nasal cannula

## 2017-03-21 NOTE — Anesthesia Post-op Follow-up Note (Signed)
Anesthesia QCDR form completed.        

## 2017-03-21 NOTE — Anesthesia Preprocedure Evaluation (Addendum)
Anesthesia Evaluation  Patient identified by MRN, date of birth, ID band Patient awake    Reviewed: Allergy & Precautions, NPO status , Patient's Chart, lab work & pertinent test results  History of Anesthesia Complications (+) PONV and history of anesthetic complications (had PONV when she had anesthesia as a young girl)  Airway Mallampati: II  TM Distance: >3 FB Neck ROM: Full    Dental  (+) Upper Dentures, Lower Dentures   Pulmonary neg sleep apnea, neg COPD, former smoker,    breath sounds clear to auscultation- rhonchi (-) wheezing      Cardiovascular hypertension, Pt. on medications (-) CAD, (-) Past MI and (-) Cardiac Stents + dysrhythmias (palpitations)  Rhythm:Regular Rate:Normal - Systolic murmurs and - Diastolic murmurs    Neuro/Psych PSYCHIATRIC DISORDERS Depression negative neurological ROS     GI/Hepatic Neg liver ROS, hiatal hernia, GERD  ,  Endo/Other  neg diabetesHypothyroidism   Renal/GU negative Renal ROS     Musculoskeletal negative musculoskeletal ROS (+)   Abdominal (+) - obese,   Peds  Hematology  (+) anemia ,   Anesthesia Other Findings Past Medical History: No date: Cancer (HCC)     Comment:  SKIN No date: Complication of anesthesia No date: Depression No date: Dysrhythmia No date: GERD (gastroesophageal reflux disease) No date: Hiatal hernia No date: HOH (hard of hearing) No date: Hypertension No date: Hypothyroidism No date: PONV (postoperative nausea and vomiting) No date: Thyroid disease   Reproductive/Obstetrics                             Anesthesia Physical Anesthesia Plan  ASA: III  Anesthesia Plan: MAC   Post-op Pain Management:    Induction: Intravenous  PONV Risk Score and Plan: 3 and Midazolam and Treatment may vary due to age or medical condition  Airway Management Planned: Natural Airway  Additional Equipment:   Intra-op Plan:    Post-operative Plan:   Informed Consent: I have reviewed the patients History and Physical, chart, labs and discussed the procedure including the risks, benefits and alternatives for the proposed anesthesia with the patient or authorized representative who has indicated his/her understanding and acceptance.     Plan Discussed with: CRNA and Anesthesiologist  Anesthesia Plan Comments:        Anesthesia Quick Evaluation

## 2017-03-22 ENCOUNTER — Encounter: Payer: Self-pay | Admitting: Ophthalmology

## 2017-03-29 ENCOUNTER — Encounter: Payer: Self-pay | Admitting: Gastroenterology

## 2017-03-29 ENCOUNTER — Telehealth: Payer: Self-pay

## 2017-03-29 ENCOUNTER — Other Ambulatory Visit
Admission: RE | Admit: 2017-03-29 | Discharge: 2017-03-29 | Disposition: A | Discharge status: Home / Self Care | Payer: Medicare Other | Source: Ambulatory Visit | Attending: Gastroenterology | Admitting: Gastroenterology

## 2017-03-29 ENCOUNTER — Ambulatory Visit (INDEPENDENT_AMBULATORY_CARE_PROVIDER_SITE_OTHER): Payer: Medicare Other | Admitting: Gastroenterology

## 2017-03-29 VITALS — BP 187/99 | HR 62 | Temp 97.7°F | Ht 61.0 in | Wt 141.2 lb

## 2017-03-29 DIAGNOSIS — K30 Functional dyspepsia: Secondary | ICD-10-CM

## 2017-03-29 DIAGNOSIS — D509 Iron deficiency anemia, unspecified: Secondary | ICD-10-CM | POA: Diagnosis not present

## 2017-03-29 LAB — CBC WITH DIFFERENTIAL/PLATELET
BASOS ABS: 0 10*3/uL (ref 0–0.1)
BASOS PCT: 1 %
Eosinophils Absolute: 0.1 10*3/uL (ref 0–0.7)
Eosinophils Relative: 2 %
HEMATOCRIT: 36 % (ref 35.0–47.0)
Hemoglobin: 11.8 g/dL — ABNORMAL LOW (ref 12.0–16.0)
LYMPHS PCT: 33 %
Lymphs Abs: 2.2 10*3/uL (ref 1.0–3.6)
MCH: 31.7 pg (ref 26.0–34.0)
MCHC: 32.9 g/dL (ref 32.0–36.0)
MCV: 96.1 fL (ref 80.0–100.0)
MONO ABS: 0.8 10*3/uL (ref 0.2–0.9)
Monocytes Relative: 12 %
NEUTROS ABS: 3.5 10*3/uL (ref 1.4–6.5)
NEUTROS PCT: 52 %
Platelets: 353 10*3/uL (ref 150–440)
RBC: 3.74 MIL/uL — AB (ref 3.80–5.20)
RDW: 13.2 % (ref 11.5–14.5)
WBC: 6.6 10*3/uL (ref 3.6–11.0)

## 2017-03-29 NOTE — Telephone Encounter (Signed)
-----   Message from Jonathon Bellows, MD sent at 03/29/2017 11:36 AM EST ----- Hb stable

## 2017-03-29 NOTE — Progress Notes (Signed)
Jonathon Bellows MD, MRCP(U.K) [address]  [place]  [place], [place] [address]  Main: [phone]  Fax: [phone]   Primary Care Physician: Albina Billet, MD  Primary Gastroenterologist:  Dr. Jonathon Bellows   No chief complaint on file.   HPI: Tracie Fisher is a 81 y.o. female   Summary of history   Tracie Mundo Stuckeyis a 81 y.o.female. She is here today to follow up to her visit on 2/18 for a dilated CBD and abdominal pain , she had a dilated common bile duct seen at that time on imaging. Previously seen for iron deficiency anemia with work up  negative for GI tract blood loss. Receiving IV iron with oncology. Hb has improved from 9 grams to 11.9 grams in 1.2018 Celiac serology , H pylori stool , B12,folate are normal .EGD and colonoscopy on 04/25/16 showed a large hiatal hernia, non obstructing schatzkis ring otherwise normal mucosa. Colonoscopy  showed internal hemorroids, diverticlusis and 2 polyps which were excised and were adenomas. Repeat colonoscopy in 3 years suggested. Capsule study of the small bowel was also normal. MRCP showed no biliary dilation . Large hiatal hernia   Interval history 12/2016-03/29/17 Says after the last visit- had a period when she had no pain , then it returned lasted 2 days. Was very tender over her abdomen  Dicyclomine not working . Says she finished her IV iron .Weight stable. She has not tried fiber pills. She has a bowel movement once every few days, presently taking stool softners. She says she is under a lot of stress and her abdomen hurts more when she is under more stress.   BP (!) 187/99 (BP Location: Left Arm, Patient Position: Sitting, Cuff Size: Normal)   Pulse 62   Temp 97.7 F (36.5 C) (Oral)   Ht 5\' 1"  (1.549 m)   Wt 141 lb 3.2 oz (64 kg)   BMI 26.68 kg/m      Current Outpatient Medications  Medication Sig Dispense Refill  . fluocinonide (LIDEX) 0.05 % external solution Apply topically.    Marland Kitchen omeprazole (PRILOSEC) 40  MG capsule Take 40 mg by mouth.    Marland Kitchen acetaminophen (TYLENOL) 325 MG tablet Take 325 mg by mouth daily as needed for moderate pain or headache.    . ALPRAZolam (XANAX) 1 MG tablet Take 0.5-1 mg by mouth 2 (two) times daily. Take 0.5 mg in the morning and 1 mg at bedtime    . calcium carbonate (TUMS - DOSED IN MG ELEMENTAL CALCIUM) 500 MG chewable tablet Chew 1 tablet by mouth at bedtime as needed for indigestion or heartburn.    . cyanocobalamin (V-R VITAMIN B-12) 500 MCG tablet Take 500 mcg by mouth daily.     Marland Kitchen dexlansoprazole (DEXILANT) 60 MG capsule Take 60 mg by mouth.    . dicyclomine (BENTYL) 10 MG capsule TAKE 1 CAPSULE BY MOUTH FOUR TIMES DAILYBEFORE MEALS AND AT BEDTIME. 90 capsule 3  . Difluprednate (DUREZOL) 0.05 % EMUL Place 1 drop into the left eye 2 (two) times daily. Starting after the procedure    . DULoxetine (CYMBALTA) 30 MG capsule Take 30 mg by mouth.    . levothyroxine (SYNTHROID, LEVOTHROID) 50 MCG tablet Take 50 mcg by mouth daily before breakfast.     . losartan (COZAAR) 100 MG tablet Take 100 mg by mouth daily.    . metoprolol tartrate (LOPRESSOR) 25 MG tablet Take 25 mg by mouth 2 (two) times daily.     Marland Kitchen  Multiple Vitamin (MULTIVITAMIN WITH MINERALS) TABS tablet Take 1 tablet by mouth daily.    . nepafenac (ILEVRO) 0.3 % ophthalmic suspension Place 1 drop into the left eye daily. Start 4 days before surgery     No current facility-administered medications for this visit.     Allergies as of 03/29/2017 - Review Complete 03/21/2017  Allergen Reaction Noted  . Aspirin Itching 02/11/2013  . Penicillin g  07/29/2015    ROS:  General: Negative for anorexia, weight loss, fever, chills, fatigue, weakness. ENT: Negative for hoarseness, difficulty swallowing , nasal congestion. CV: Negative for chest pain, angina, palpitations, dyspnea on exertion, peripheral edema.  Respiratory: Negative for dyspnea at rest, dyspnea on exertion, cough, sputum, wheezing.  GI: See history  of present illness. GU:  Negative for dysuria, hematuria, urinary incontinence, urinary frequency, nocturnal urination.  Endo: Negative for unusual weight change.    Physical Examination:   There were no vitals taken for this visit.  General: Well-nourished, well-developed in no acute distress.  Eyes: No icterus. Conjunctivae pink. Mouth: Oropharyngeal mucosa moist and pink , no lesions erythema or exudate. Lungs: Clear to auscultation bilaterally. Non-labored. Heart: Regular rate and rhythm, no murmurs rubs or gallops.  Abdomen: Bowel sounds are normal, nontender, nondistended, no hepatosplenomegaly or masses, no abdominal bruits or hernia , no rebound or guarding.   Extremities: No lower extremity edema. No clubbing or deformities. Neuro: Alert and oriented x 3.  Grossly intact. Skin: Warm and dry, no jaundice.   Psych: Alert and cooperative, normal mood and affect.   Imaging Studies: No results found.  Assessment and Plan:   Tracie Fisher is a 81 y.o. y/o female here for follow up for for iron deficiency anemia, abdominal pain .  1. Abdominal pain and large hiatal hernia.Abdominal pain is back since last visit- appears it is of functional in nature- MRCP, LFT were normal - no further evaluation needed . Trial of fiber pills-samples provided- if fails will try linzess at next visit. Would suggest seeing a psycologist to address the stress which I think is a trigger for her pain- she is not keen .   2. Iron deficiency anemia:EGD+colonoscopy and capsule study showed no GI source of bleeding . Possible etiologies are insufficienct oral intake vs decreased absorption vs related to hiatal hernia. Check CBC  Dr Jonathon Bellows  MD,MRCP [facility]) Follow up in 6 months

## 2017-03-29 NOTE — Telephone Encounter (Signed)
Advised patient that labs were stable per Dr. Vicente Males.

## 2017-04-10 ENCOUNTER — Encounter: Payer: Self-pay | Admitting: *Deleted

## 2017-04-11 ENCOUNTER — Encounter
Admission: RE | Disposition: A | Discharge status: Home / Self Care | Payer: Self-pay | Source: Ambulatory Visit | Attending: Ophthalmology

## 2017-04-11 ENCOUNTER — Ambulatory Visit
Admission: RE | Admit: 2017-04-11 | Discharge: 2017-04-11 | Disposition: A | Discharge status: Home / Self Care | Payer: Medicare Other | Source: Ambulatory Visit | Attending: Ophthalmology | Admitting: Ophthalmology

## 2017-04-11 ENCOUNTER — Ambulatory Visit: Payer: Medicare Other | Admitting: Anesthesiology

## 2017-04-11 ENCOUNTER — Other Ambulatory Visit: Payer: Self-pay

## 2017-04-11 DIAGNOSIS — H2511 Age-related nuclear cataract, right eye: Secondary | ICD-10-CM | POA: Diagnosis present

## 2017-04-11 DIAGNOSIS — E039 Hypothyroidism, unspecified: Secondary | ICD-10-CM | POA: Insufficient documentation

## 2017-04-11 DIAGNOSIS — I10 Essential (primary) hypertension: Secondary | ICD-10-CM | POA: Insufficient documentation

## 2017-04-11 DIAGNOSIS — Z87891 Personal history of nicotine dependence: Secondary | ICD-10-CM | POA: Diagnosis not present

## 2017-04-11 DIAGNOSIS — Z85828 Personal history of other malignant neoplasm of skin: Secondary | ICD-10-CM | POA: Insufficient documentation

## 2017-04-11 DIAGNOSIS — Z79899 Other long term (current) drug therapy: Secondary | ICD-10-CM | POA: Insufficient documentation

## 2017-04-11 DIAGNOSIS — K219 Gastro-esophageal reflux disease without esophagitis: Secondary | ICD-10-CM | POA: Insufficient documentation

## 2017-04-11 HISTORY — PX: CATARACT EXTRACTION W/PHACO: SHX586

## 2017-04-11 SURGERY — PHACOEMULSIFICATION, CATARACT, WITH IOL INSERTION
Anesthesia: Monitor Anesthesia Care | Site: Eye | Laterality: Right | Wound class: Clean

## 2017-04-11 MED ORDER — MOXIFLOXACIN HCL 0.5 % OP SOLN
OPHTHALMIC | Status: AC
  Filled 2017-04-11: qty 3

## 2017-04-11 MED ORDER — MOXIFLOXACIN HCL 0.5 % OP SOLN: 1.0000 [drp] | OPHTHALMIC | Status: DC | PRN

## 2017-04-11 MED ORDER — ARMC OPHTHALMIC DILATING DROPS
1.0000 "application " | OPHTHALMIC | Status: AC
  Administered 2017-04-11 (×3): 1 via OPHTHALMIC

## 2017-04-11 MED ORDER — LIDOCAINE HCL (PF) 4 % IJ SOLN
INTRAMUSCULAR | Status: DC | PRN
  Administered 2017-04-11: 4 mL via OPHTHALMIC

## 2017-04-11 MED ORDER — MOXIFLOXACIN HCL 0.5 % OP SOLN
OPHTHALMIC | Status: DC | PRN
  Administered 2017-04-11: 0.2 mL via OPHTHALMIC

## 2017-04-11 MED ORDER — MIDAZOLAM HCL 2 MG/2ML IJ SOLN
INTRAMUSCULAR | Status: DC | PRN
  Administered 2017-04-11 (×2): 1 mg via INTRAVENOUS

## 2017-04-11 MED ORDER — SODIUM CHLORIDE 0.9 % IV SOLN
INTRAVENOUS | Status: DC
  Administered 2017-04-11: 12:00:00 via INTRAVENOUS

## 2017-04-11 MED ORDER — ARMC OPHTHALMIC DILATING DROPS
OPHTHALMIC | Status: AC
  Administered 2017-04-11: 1 via OPHTHALMIC
  Filled 2017-04-11: qty 0.4

## 2017-04-11 MED ORDER — CARBACHOL 0.01 % IO SOLN
INTRAOCULAR | Status: DC | PRN
  Administered 2017-04-11: 0.5 mL via INTRAOCULAR

## 2017-04-11 MED ORDER — EPINEPHRINE PF 1 MG/ML IJ SOLN
INTRAOCULAR | Status: DC | PRN
  Administered 2017-04-11: 12:00:00 via OPHTHALMIC

## 2017-04-11 MED ORDER — NA CHONDROIT SULF-NA HYALURON 40-17 MG/ML IO SOLN
INTRAOCULAR | Status: DC | PRN
  Administered 2017-04-11: 1 mL via INTRAOCULAR

## 2017-04-11 MED ORDER — POVIDONE-IODINE 5 % OP SOLN
OPHTHALMIC | Status: DC | PRN
  Administered 2017-04-11: 1 via OPHTHALMIC

## 2017-04-11 MED ORDER — MIDAZOLAM HCL 2 MG/2ML IJ SOLN
INTRAMUSCULAR | Status: AC
  Filled 2017-04-11: qty 2

## 2017-04-11 SURGICAL SUPPLY — 16 items
GLOVE BIO SURGEON STRL SZ8 (GLOVE) ×3 IMPLANT
GLOVE BIOGEL M 6.5 STRL (GLOVE) ×3 IMPLANT
GLOVE SURG LX 8.0 MICRO (GLOVE) ×2
GLOVE SURG LX STRL 8.0 MICRO (GLOVE) ×1 IMPLANT
GOWN STRL REUS W/ TWL LRG LVL3 (GOWN DISPOSABLE) ×2 IMPLANT
GOWN STRL REUS W/TWL LRG LVL3 (GOWN DISPOSABLE) ×4
LABEL CATARACT MEDS ST (LABEL) ×3 IMPLANT
LENS IOL TECNIS ITEC 22.0 (Intraocular Lens) ×3 IMPLANT
PACK CATARACT (MISCELLANEOUS) ×3 IMPLANT
PACK CATARACT BRASINGTON LX (MISCELLANEOUS) ×3 IMPLANT
PACK EYE AFTER SURG (MISCELLANEOUS) ×3 IMPLANT
SOL BSS BAG (MISCELLANEOUS) ×3
SOLUTION BSS BAG (MISCELLANEOUS) ×1 IMPLANT
SYR 5ML LL (SYRINGE) ×3 IMPLANT
WATER STERILE IRR 250ML POUR (IV SOLUTION) ×3 IMPLANT
WIPE NON LINTING 3.25X3.25 (MISCELLANEOUS) ×3 IMPLANT

## 2017-04-11 NOTE — Anesthesia Preprocedure Evaluation (Signed)
Anesthesia Evaluation  Patient identified by MRN, date of birth, ID band Patient awake    Reviewed: Allergy & Precautions, NPO status , Patient's Chart, lab work & pertinent test results  History of Anesthesia Complications (+) PONV and history of anesthetic complications (had PONV when she had anesthesia as a young girl)  Airway Mallampati: II  TM Distance: >3 FB Neck ROM: Full    Dental  (+) Upper Dentures, Lower Dentures   Pulmonary neg sleep apnea, neg COPD, former smoker,    breath sounds clear to auscultation- rhonchi (-) wheezing      Cardiovascular hypertension, Pt. on medications (-) CAD, (-) Past MI and (-) Cardiac Stents + dysrhythmias (palpitations)  Rhythm:Regular Rate:Normal - Systolic murmurs and - Diastolic murmurs    Neuro/Psych PSYCHIATRIC DISORDERS negative neurological ROS     GI/Hepatic Neg liver ROS, hiatal hernia, GERD  ,  Endo/Other  neg diabetesHypothyroidism   Renal/GU negative Renal ROS     Musculoskeletal negative musculoskeletal ROS (+)   Abdominal (+) - obese,   Peds  Hematology  (+) anemia ,   Anesthesia Other Findings Past Medical History: No date: Cancer ([place])     Comment:  SKIN No date: Complication of anesthesia No date: Depression No date: Dysrhythmia No date: GERD (gastroesophageal reflux disease) No date: Hiatal hernia No date: HOH (hard of hearing) No date: Hypertension No date: Hypothyroidism No date: PONV (postoperative nausea and vomiting) No date: Thyroid disease   Reproductive/Obstetrics                             Anesthesia Physical  Anesthesia Plan  ASA: III  Anesthesia Plan: MAC   Post-op Pain Management:    Induction: Intravenous  PONV Risk Score and Plan: 3 and Midazolam and Treatment may vary due to age or medical condition  Airway Management Planned: Natural Airway and Nasal Cannula  Additional Equipment:   Intra-op  Plan:   Post-operative Plan:   Informed Consent: I have reviewed the patients History and Physical, chart, labs and discussed the procedure including the risks, benefits and alternatives for the proposed anesthesia with the patient or authorized representative who has indicated his/her understanding and acceptance.     Plan Discussed with: CRNA and Anesthesiologist  Anesthesia Plan Comments:         Anesthesia Quick Evaluation

## 2017-04-11 NOTE — Anesthesia Postprocedure Evaluation (Signed)
Anesthesia Post Note  Patient: Tracie Fisher  Procedure(s) Performed: CATARACT EXTRACTION PHACO AND INTRAOCULAR LENS PLACEMENT ([place]) (Right Eye)  Patient location during evaluation: Short Stay Anesthesia Type: MAC Level of consciousness: awake and alert and oriented Pain management: pain level controlled Vital Signs Assessment: post-procedure vital signs reviewed and stable Respiratory status: spontaneous breathing Cardiovascular status: stable Postop Assessment: no headache, no apparent nausea or vomiting and adequate PO intake Anesthetic complications: no     Last Vitals:  Vitals:   04/11/17 1123 04/11/17 1240  BP: (!) 178/81 (!) 157/70  Pulse: 64 61  Resp: 16 16  Temp: (!) 36 C 36.9 C  SpO2: 99% 100%    Last Pain:  Vitals:   04/11/17 1240  TempSrc: Temporal  PainSc:                  Lanora Manis

## 2017-04-11 NOTE — Anesthesia Post-op Follow-up Note (Signed)
Anesthesia QCDR form completed.        

## 2017-04-11 NOTE — H&P (Signed)
All labs reviewed. Abnormal studies sent to patients PCP when indicated.  Previous H&P reviewed, patient examined, there are NO CHANGES.  Tracie Fisher LOUIS11/27/201812:12 PM

## 2017-04-11 NOTE — Discharge Instructions (Signed)
Eye Surgery Discharge Instructions  Expect mild scratchy sensation or mild soreness. DO NOT RUB YOUR EYE!  The day of surgery:  Minimal physical activity, but bed rest is not required  No reading, computer work, or close hand work  No bending, lifting, or straining.  May watch TV  For 24 hours:  No driving, legal decisions, or alcoholic beverages  Safety precautions  Eat anything you prefer: It is better to start with liquids, then soup then solid foods.  _____ Eye patch should be worn until postoperative exam tomorrow.  ____ Solar shield eyeglasses should be worn for comfort in the sunlight/patch while sleeping  Resume all regular medications including aspirin or Coumadin if these were discontinued prior to surgery. You may shower, bathe, shave, or wash your hair. Tylenol may be taken for mild discomfort.  Call your doctor if you experience significant pain, nausea, or vomiting, fever > 101 or other signs of infection. [phone] or [phone] Specific instructions:  Follow-up Information    Birder Robson, MD Follow up in 1 day(s).   Specialty:  Ophthalmology Why:  Wednesday, April 12, 2017 at 10:55 Contact information: [place] [address] [phone]        Marijo File, MD .   Specialties:  Internal Medicine, Gerontology Contact information: [place] [address] [phone]

## 2017-04-11 NOTE — Op Note (Signed)
PREOPERATIVE DIAGNOSIS:  Nuclear sclerotic cataract of the right eye.   POSTOPERATIVE DIAGNOSIS:  NUCLEAR SCLEROTIC CATARACT RIGHT EYE   OPERATIVE PROCEDURE: Procedure(s): CATARACT EXTRACTION PHACO AND INTRAOCULAR LENS PLACEMENT (IOC)   SURGEON:  Birder Robson, MD.   ANESTHESIA:  Anesthesiologist: Martha Clan, MD CRNA: Jonna Clark, CRNA  1.      Managed anesthesia care. 2.      0.36ml of Shugarcaine was instilled in the eye following the paracentesis.   COMPLICATIONS:  None.   TECHNIQUE:   Stop and chop   DESCRIPTION OF PROCEDURE:  The patient was examined and consented in the preoperative holding area where the aforementioned topical anesthesia was applied to the right eye and then brought back to the Operating Room where the right eye was prepped and draped in the usual sterile ophthalmic fashion and a lid speculum was placed. A paracentesis was created with the side port blade and the anterior chamber was filled with viscoelastic. A near clear corneal incision was performed with the steel keratome. A continuous curvilinear capsulorrhexis was performed with a cystotome followed by the capsulorrhexis forceps. Hydrodissection and hydrodelineation were carried out with BSS on a blunt cannula. The lens was removed in a stop and chop  technique and the remaining cortical material was removed with the irrigation-aspiration handpiece. The capsular bag was inflated with viscoelastic and the Technis [id number]  lens was placed in the capsular bag without complication. The remaining viscoelastic was removed from the eye with the irrigation-aspiration handpiece. The wounds were hydrated. The anterior chamber was flushed with Miostat and the eye was inflated to physiologic pressure. 0.69ml of Vigamox was placed in the anterior chamber. The wounds were found to be water tight. The eye was dressed with Vigamox. The patient was given protective glasses to wear throughout the day and a shield with which  to sleep tonight. The patient was also given drops with which to begin a drop regimen today and will follow-up with me in one day. Implant Name Type Inv. Item Serial No. Manufacturer Lot No. LRB No. Used  LENS IOL DIOP 22.0 - [id number] Intraocular Lens LENS IOL DIOP 22.0 [id number]  Right 1   Procedure(s) with comments: CATARACT EXTRACTION PHACO AND INTRAOCULAR LENS PLACEMENT (IOC) (Right) - [place] 01:09.5 AP% 15.8 CDE 11.04 Fluid Pack lot # [id number]  Electronically signed: [place] 04/11/2017 12:42 PM

## 2017-04-11 NOTE — Transfer of Care (Signed)
Immediate Anesthesia Transfer of Care Note  Patient: Tracie Fisher  Procedure(s) Performed: CATARACT EXTRACTION PHACO AND INTRAOCULAR LENS PLACEMENT (IOC) (Right Eye)  Patient Location: Short Stay  Anesthesia Type:MAC  Level of Consciousness: awake, alert  and oriented  Airway & Oxygen Therapy: Patient Spontanous Breathing  Post-op Assessment: Report given to RN and Post -op Vital signs reviewed and stable  Post vital signs: Reviewed and stable  Last Vitals:  Vitals:   04/11/17 1123 04/11/17 1240  BP: (!) 178/81 (!) 157/70  Pulse: 64 63  Resp: 16 18  Temp: (!) 36 C   SpO2: 99% 99%    Last Pain:  Vitals:   04/11/17 1123  TempSrc: Tympanic  PainSc: 3          Complications: No apparent anesthesia complications

## 2017-04-12 ENCOUNTER — Encounter: Payer: Self-pay | Admitting: Ophthalmology

## 2017-04-12 NOTE — Addendum Note (Signed)
Addendum  created 04/12/17 1140 by Martha Clan, MD   [facility] Attestations filed

## 2017-05-01 ENCOUNTER — Other Ambulatory Visit: Payer: Self-pay | Admitting: Internal Medicine

## 2017-05-01 DIAGNOSIS — Z1231 Encounter for screening mammogram for malignant neoplasm of breast: Secondary | ICD-10-CM

## 2017-06-12 ENCOUNTER — Ambulatory Visit
Admission: RE | Admit: 2017-06-12 | Discharge: 2017-06-12 | Disposition: A | Discharge status: Home / Self Care | Payer: Medicare Other | Source: Ambulatory Visit | Attending: Internal Medicine | Admitting: Internal Medicine

## 2017-06-12 DIAGNOSIS — Z1231 Encounter for screening mammogram for malignant neoplasm of breast: Secondary | ICD-10-CM | POA: Insufficient documentation

## 2017-11-12 IMAGING — CR DG CHEST 2V
1 series · 2 of 2 positions shown · non-contrast
Comparison: 08/02/2011

CLINICAL DATA: Nonproductive cough for 5 weeks

EXAM:
CHEST  2 VIEW

[Series 1: dg chest 2 view · 0.14mm/px · 2 of 2 slices shown]
[im 1/2]
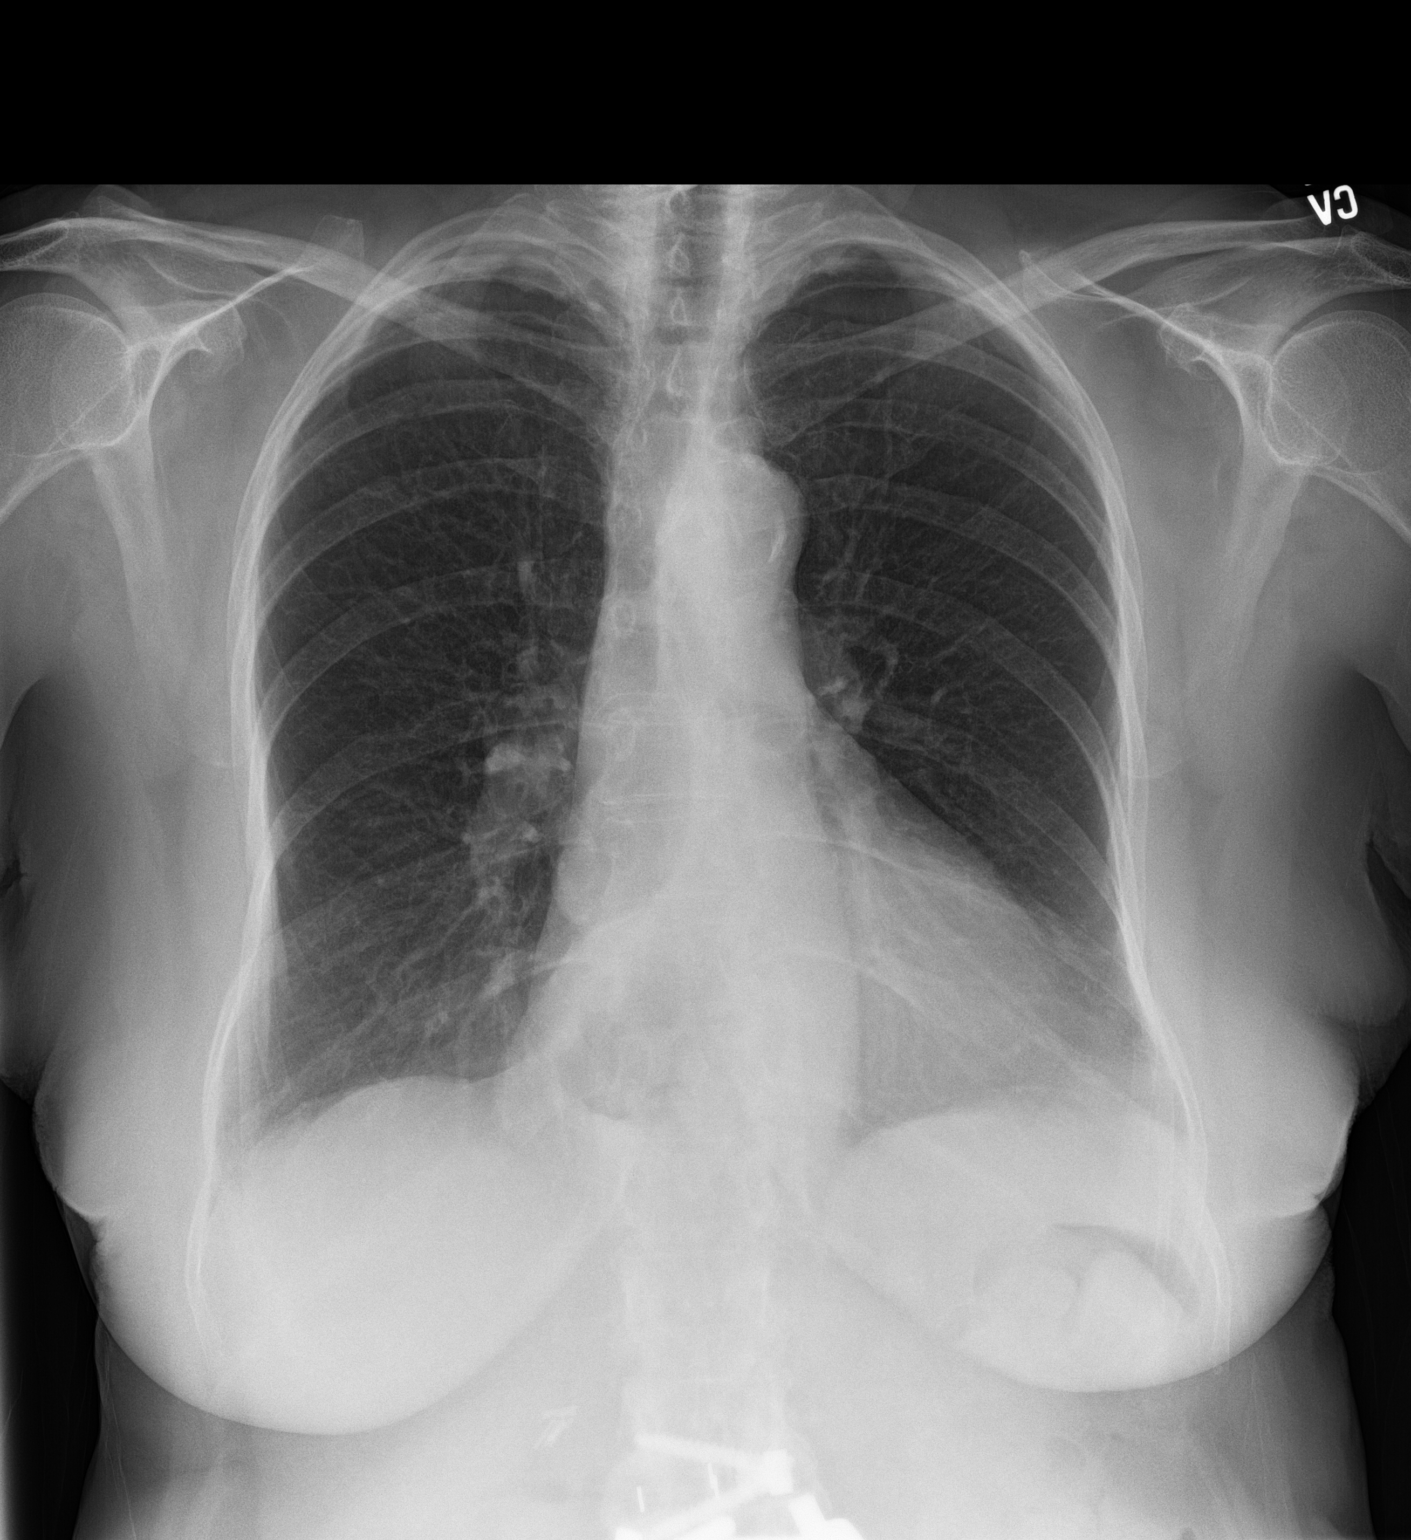
[im 2/2]
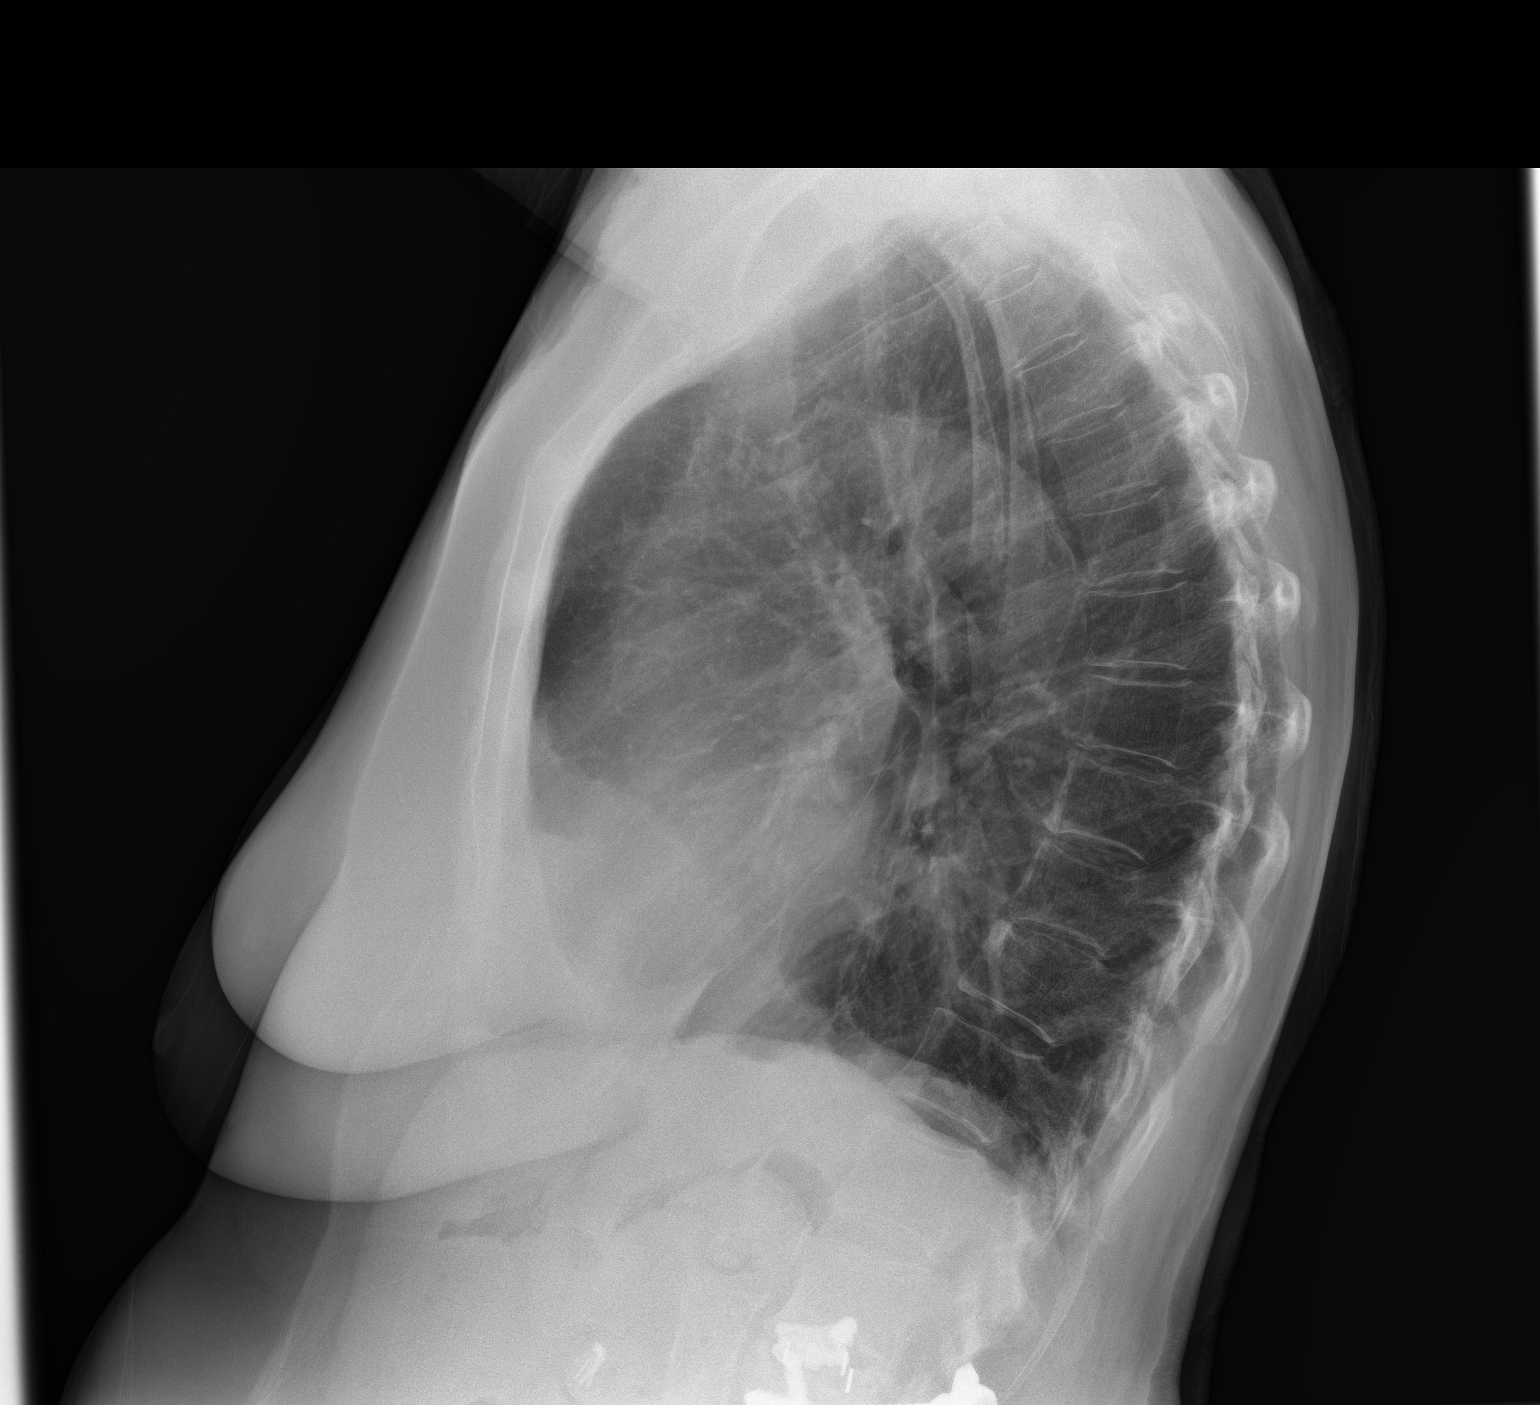

[2 of 2 positions shown; findings below may reference images not displayed]

FINDINGS: Normal heart size and stable aortic contours. There is a moderate
sliding hiatal hernia, appearance similar to abdominal CT from 9538.
There is no edema, consolidation, effusion, or pneumothorax.
Symmetric and chronic biapical pleural based scarring.
IMPRESSION: No active cardiopulmonary disease.

## 2017-12-29 IMAGING — MG MM SCREENING BREAST TOMO BILATERAL
9 of 12 series · 9 of 28 positions shown · non-contrast
Comparison: Previous exam(s).

CLINICAL DATA: Screening.

EXAM:
DIGITAL SCREENING BILATERAL MAMMOGRAM WITH 3D TOMO WITH CAD

[L CC]
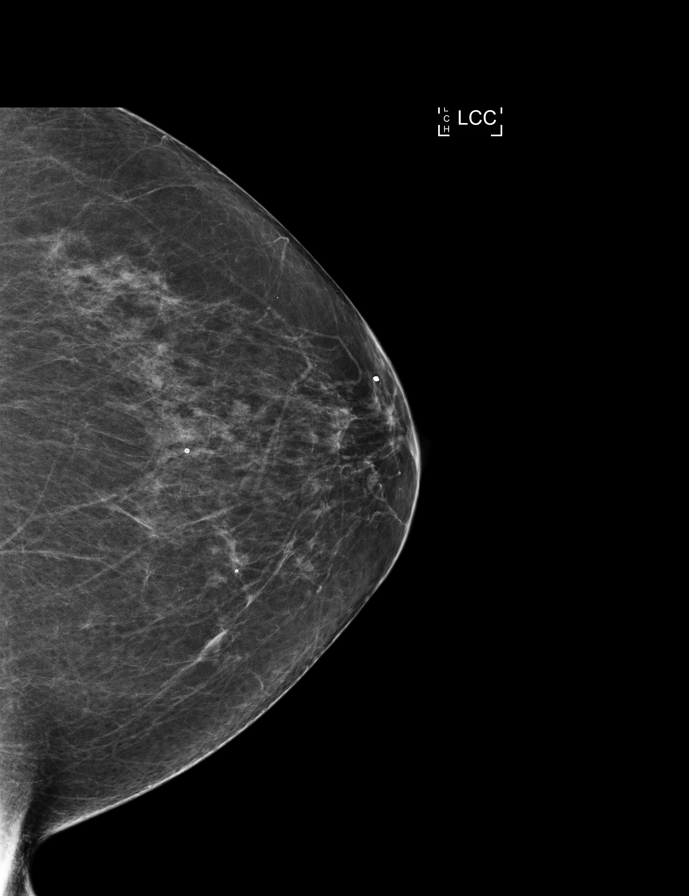

[R MLO]
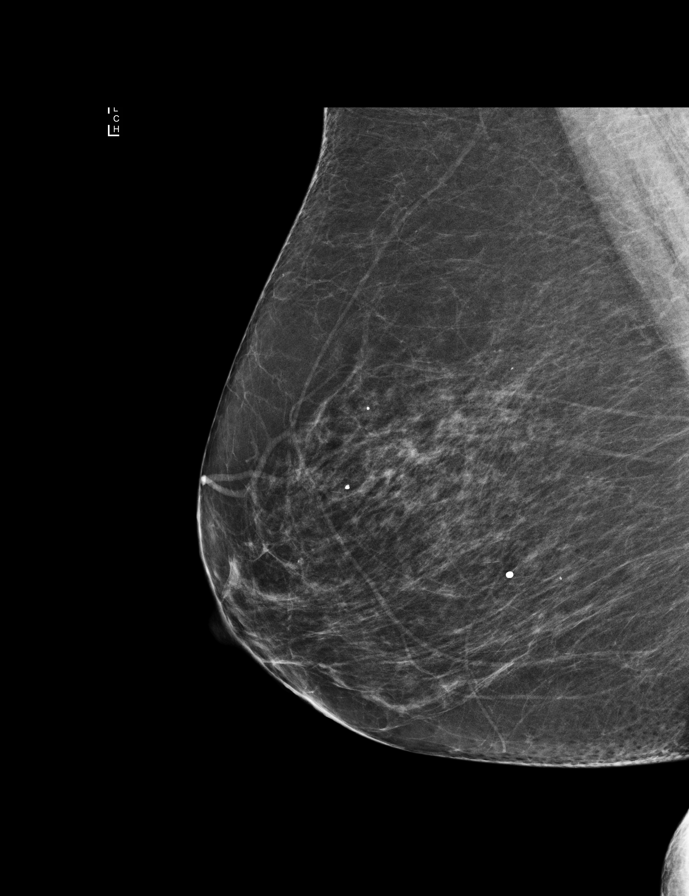

[L MLO]
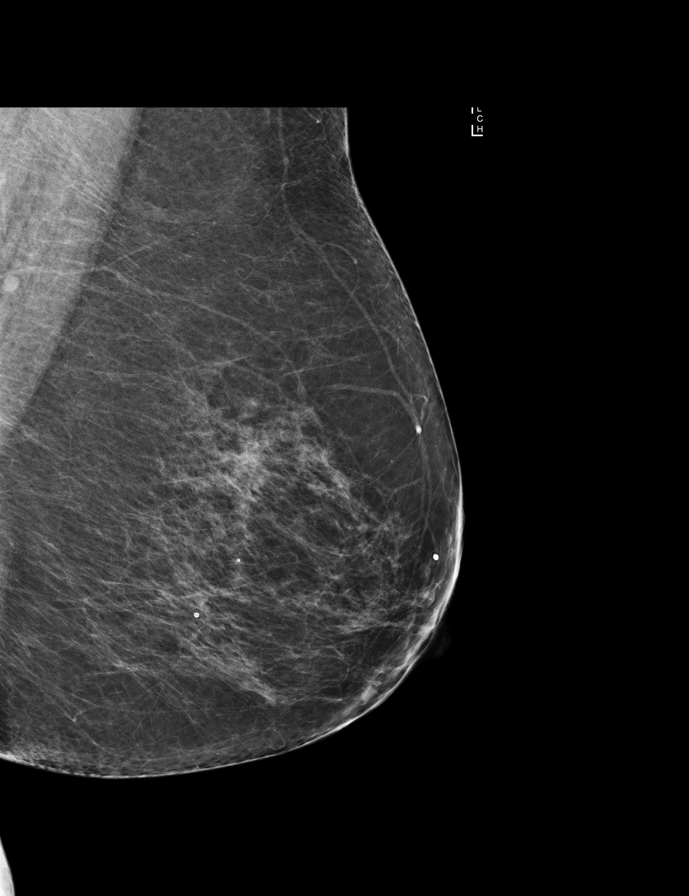

[L MLO synth-2D]
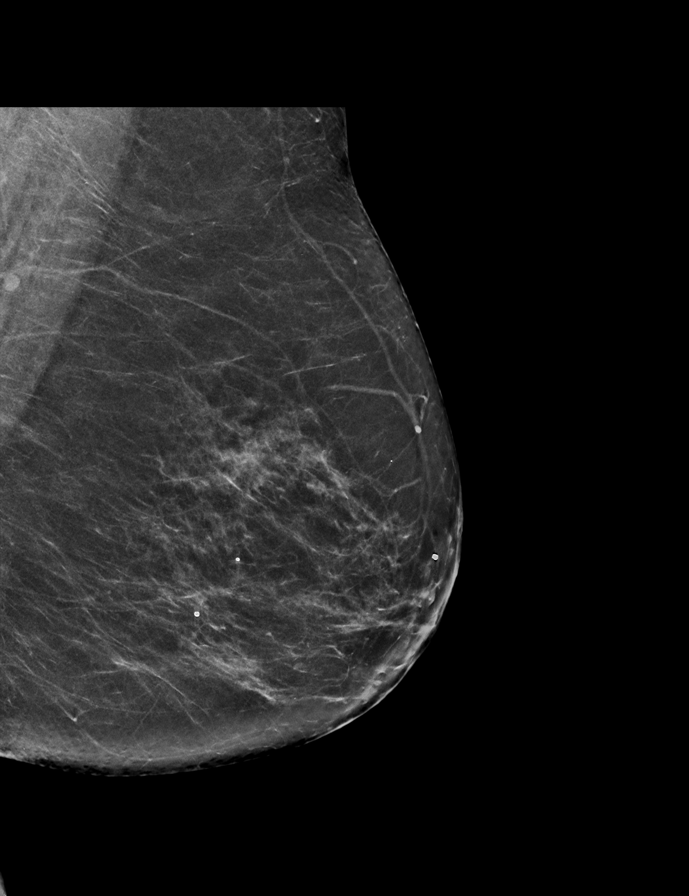

[R CC synth-2D]
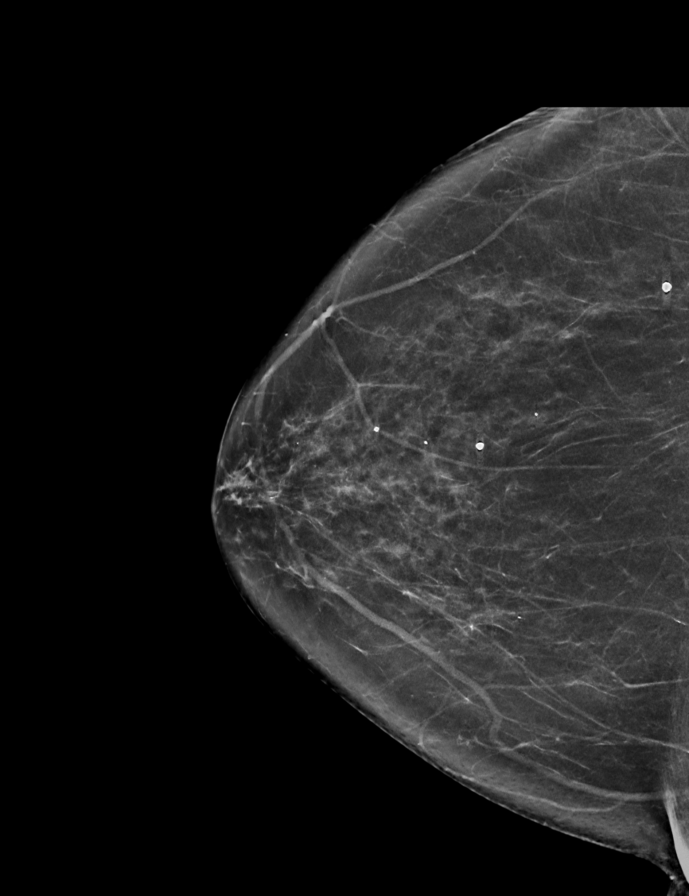

[R MLO synth-2D]
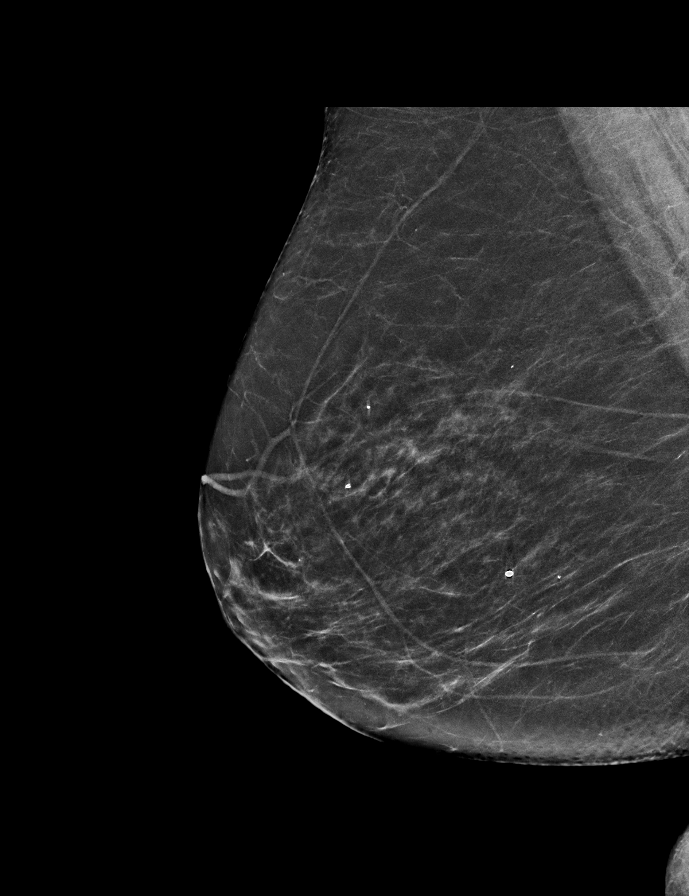

[L CC synth-2D]
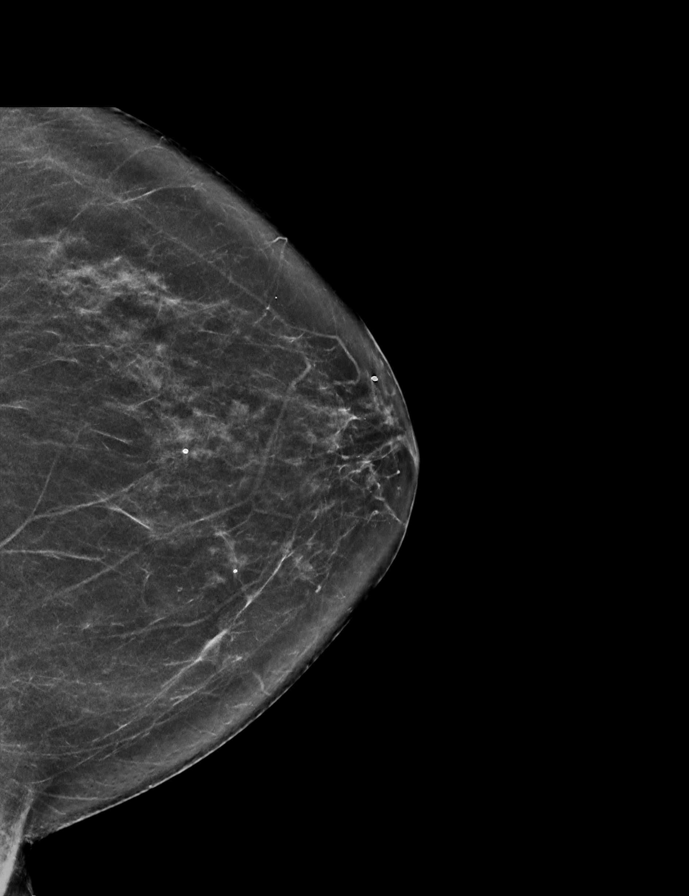

[R CC]
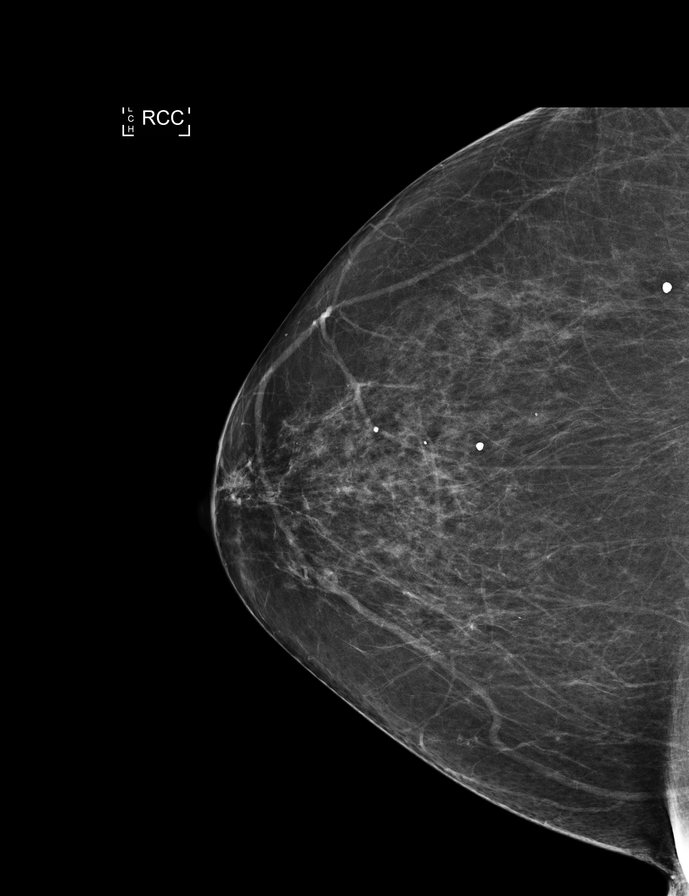

[L MLO tomo · tomo slice 33/64.0]
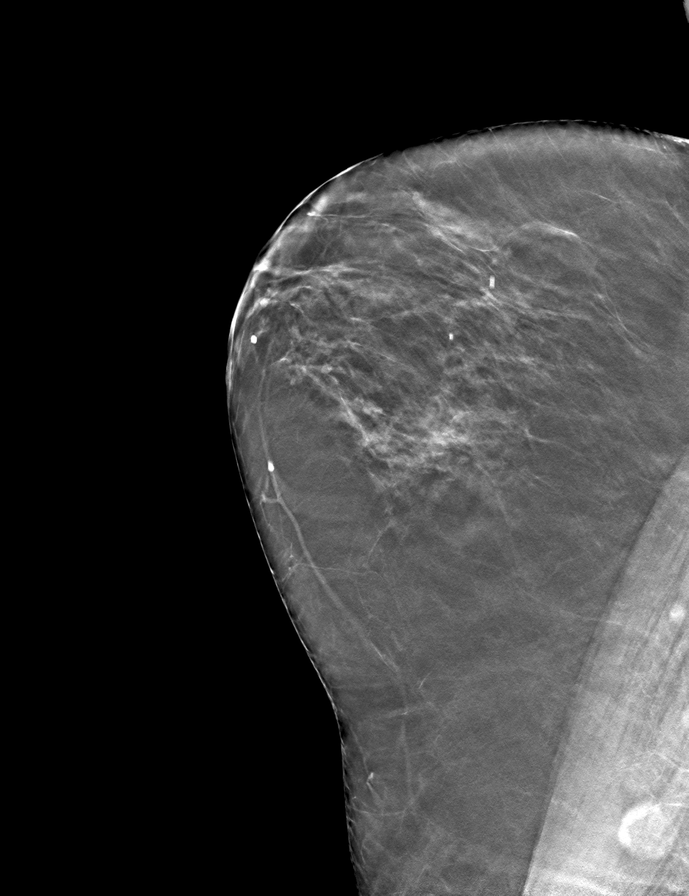

[9 of 28 positions shown; findings below may reference images not displayed]

ACR Breast Density Category b: There are scattered areas of
fibroglandular density.
FINDINGS: There are no findings suspicious for malignancy. Images were
processed with CAD.
IMPRESSION: No mammographic evidence of malignancy. A result letter of this
screening mammogram will be mailed directly to the patient.

RECOMMENDATION:
Screening mammogram in one year. (Code:55-L-23V)

BI-RADS CATEGORY  1: Negative.

## 2018-02-06 DIAGNOSIS — G629 Polyneuropathy, unspecified: Secondary | ICD-10-CM | POA: Insufficient documentation

## 2018-03-06 DIAGNOSIS — M79671 Pain in right foot: Secondary | ICD-10-CM | POA: Insufficient documentation

## 2018-03-06 DIAGNOSIS — G8929 Other chronic pain: Secondary | ICD-10-CM | POA: Insufficient documentation

## 2018-03-06 DIAGNOSIS — M542 Cervicalgia: Secondary | ICD-10-CM | POA: Insufficient documentation

## 2018-05-07 ENCOUNTER — Other Ambulatory Visit: Payer: Self-pay | Admitting: Internal Medicine

## 2018-05-07 DIAGNOSIS — Z1231 Encounter for screening mammogram for malignant neoplasm of breast: Secondary | ICD-10-CM

## 2018-06-13 ENCOUNTER — Ambulatory Visit
Admission: RE | Admit: 2018-06-13 | Discharge: 2018-06-13 | Disposition: A | Discharge status: Home / Self Care | Payer: Medicare Other | Source: Ambulatory Visit | Attending: Internal Medicine | Admitting: Internal Medicine

## 2018-06-13 DIAGNOSIS — Z1231 Encounter for screening mammogram for malignant neoplasm of breast: Secondary | ICD-10-CM | POA: Insufficient documentation

## 2018-07-31 DIAGNOSIS — G629 Polyneuropathy, unspecified: Secondary | ICD-10-CM | POA: Insufficient documentation

## 2018-11-06 ENCOUNTER — Other Ambulatory Visit: Payer: Self-pay | Admitting: Internal Medicine

## 2018-11-06 ENCOUNTER — Other Ambulatory Visit (HOSPITAL_COMMUNITY): Payer: Self-pay | Admitting: Internal Medicine

## 2018-11-06 DIAGNOSIS — R29898 Other symptoms and signs involving the musculoskeletal system: Secondary | ICD-10-CM

## 2018-11-13 ENCOUNTER — Ambulatory Visit
Admission: RE | Admit: 2018-11-13 | Discharge: 2018-11-13 | Disposition: A | Discharge status: Home / Self Care | Payer: Medicare Other | Source: Ambulatory Visit | Attending: Internal Medicine | Admitting: Internal Medicine

## 2018-11-13 ENCOUNTER — Other Ambulatory Visit: Payer: Self-pay

## 2018-11-13 DIAGNOSIS — R29898 Other symptoms and signs involving the musculoskeletal system: Secondary | ICD-10-CM | POA: Insufficient documentation

## 2019-03-04 DIAGNOSIS — Z8673 Personal history of transient ischemic attack (TIA), and cerebral infarction without residual deficits: Secondary | ICD-10-CM | POA: Insufficient documentation

## 2019-03-05 ENCOUNTER — Encounter (INDEPENDENT_AMBULATORY_CARE_PROVIDER_SITE_OTHER): Payer: Self-pay | Admitting: Vascular Surgery

## 2019-03-05 ENCOUNTER — Encounter (INDEPENDENT_AMBULATORY_CARE_PROVIDER_SITE_OTHER): Payer: Self-pay

## 2019-03-05 ENCOUNTER — Ambulatory Visit (INDEPENDENT_AMBULATORY_CARE_PROVIDER_SITE_OTHER): Payer: Medicare Other | Admitting: Vascular Surgery

## 2019-03-05 ENCOUNTER — Other Ambulatory Visit: Payer: Self-pay

## 2019-03-05 VITALS — BP 160/89 | HR 67 | Resp 16 | Wt 144.0 lb

## 2019-03-05 DIAGNOSIS — M79605 Pain in left leg: Secondary | ICD-10-CM | POA: Diagnosis not present

## 2019-03-05 DIAGNOSIS — M79604 Pain in right leg: Secondary | ICD-10-CM

## 2019-03-05 DIAGNOSIS — M7989 Other specified soft tissue disorders: Secondary | ICD-10-CM | POA: Diagnosis not present

## 2019-03-05 DIAGNOSIS — I10 Essential (primary) hypertension: Secondary | ICD-10-CM

## 2019-03-05 DIAGNOSIS — M79609 Pain in unspecified limb: Secondary | ICD-10-CM | POA: Insufficient documentation

## 2019-03-05 NOTE — Assessment & Plan Note (Signed)
I have had a long discussion with the patient regarding swelling and why it  causes symptoms.  Patient has started using compression stockings under the guidance of her podiatrist with mild improvement at best so far.  The patient is instructed specifically not to sleep in the stockings.  In addition, behavioral modification will be initiated.  This will include frequent elevation, use of over the counter pain medications and exercise such as walking. I have reviewed systemic causes for chronic edema such as liver, kidney and cardiac etiologies.  The patient denies problems with these organ systems.   Consideration for a lymph pump will also be made based upon the effectiveness of conservative therapy.  This would help to improve the edema control and prevent sequela such as ulcers and infections  Patient should undergo duplex ultrasound of the venous system to ensure that DVT or reflux is not present. The patient will follow-up with me after the ultrasound.

## 2019-03-05 NOTE — Progress Notes (Signed)
Patient ID: Tracie Fisher, female   DOB: 04/19/1936, 83 y.o.   MRN: [id number]  Chief Complaint  Patient presents with  . New Patient (Initial Visit)    ref Hall Busing for le edema    HPI Tracie Fisher is a 83 y.o. female.  I am asked to see the patient by Dr. Hall Busing and Dr. Elvina Mattes for evaluation of lower extremity pain and swelling.  This has been going on for the better part of a year now.  There is no clear inciting event or causative factor that started the symptoms.  The right lower extremity is more severely affected of the 2 legs.  She started on compression stockings and better fitting shoes by Dr. Elvina Mattes and this may have helped the symptoms slightly.  The pain is exacerbated with even short distances of walking.  The swelling is better with compression stockings but she still notices it significantly at the end of the day.  She had a small stroke earlier this year and has had multiple previous back procedures/surgeries.  She has been told she has neuropathy but the medication she has been given for it have not helped.  No history of ulceration or infection.  No fever or chills   Past Medical History:  Diagnosis Date  . Cancer ([place])    SKIN  . Complication of anesthesia   . Depression   . Dysrhythmia   . GERD (gastroesophageal reflux disease)   . Hiatal hernia   . HOH (hard of hearing)   . Hypertension   . Hypothyroidism   . PONV (postoperative nausea and vomiting)   . Thyroid disease     Past Surgical History:  Procedure Laterality Date  . APPENDECTOMY    . BACK SURGERY     MULTIPLE/ROD IN BACK  . BREAST CYST ASPIRATION Left "years ago"  . CATARACT EXTRACTION W/PHACO Left 03/21/2017   Procedure: CATARACT EXTRACTION PHACO AND INTRAOCULAR LENS PLACEMENT ([facility]);  Surgeon: Birder Robson, MD;  Location: [facility] ORS;  Service: Ophthalmology;  Laterality: Left;  [place] 00:48 AP% 17.8 CDE 8.57 Fluid pack lot # [id number]  . CATARACT EXTRACTION W/PHACO Right 04/11/2017   Procedure:  CATARACT EXTRACTION PHACO AND INTRAOCULAR LENS PLACEMENT ([place]);  Surgeon: Birder Robson, MD;  Location: [facility];  Service: Ophthalmology;  Laterality: Right;  [place] 01:09.5 AP% 15.8 CDE 11.04 Fluid Pack lot # [id number]  . CHOLECYSTECTOMY    . COLON SURGERY     BOWEL OBSTRUCTION  . COLONOSCOPY WITH PROPOFOL N/A 04/25/2016   Procedure: COLONOSCOPY WITH PROPOFOL;  Surgeon: Jonathon Bellows, MD;  Location: [facility];  Service: Endoscopy;  Laterality: N/A;  . ESOPHAGOGASTRODUODENOSCOPY (EGD) WITH PROPOFOL N/A 04/25/2016   Procedure: ESOPHAGOGASTRODUODENOSCOPY (EGD) WITH PROPOFOL;  Surgeon: Jonathon Bellows, MD;  Location: [facility] ENDOSCOPY;  Service: Endoscopy;  Laterality: N/A;  . EXPLORATORY LAPAROTOMY  10/18/2011   blockage small intestines  . GIVENS CAPSULE STUDY N/A 05/11/2016   Procedure: GIVENS CAPSULE STUDY;  Surgeon: Jonathon Bellows, MD;  Location: [facility] ENDOSCOPY;  Service: Endoscopy;  Laterality: N/A;    Family History Family History  Problem Relation Age of Onset  . Breast cancer Sister 48  . Heart attack Brother   . Heart disease Father   No bleeding or clotting disorders  Social History Social History   Tobacco Use  . Smoking status: Former Smoker    Years: 20.00  . Smokeless tobacco: Never Used  Substance Use Topics  . Alcohol use: Yes    Comment: 1 beer /day  or 2 occ  . Drug use: No    Allergies  Allergen Reactions  . Aspirin Itching  . Penicillins Other (See Comments)    Unknown reaction Has patient had a PCN reaction causing immediate rash, facial/tongue/throat swelling, SOB or lightheadedness with hypotension: Unknown Has patient had a PCN reaction causing severe rash involving mucus membranes or skin necrosis: Unknown Has patient had a PCN reaction that required hospitalization: Unknown Has patient had a PCN reaction occurring within the last 10 years: No If all of the above answers are "NO", then may proceed with Cephalosporin use. Patient does not remember the  reaction to penicillin.  Marland Kitchen Penicillin G     Unknown reaction Has patient had a PCN reaction causing immediate rash, facial/tongue/throat swelling, SOB or lightheadedness with hypotension: Unknown Has patient had a PCN reaction causing severe rash involving mucus membranes or skin necrosis: Unknown Has patient had a PCN reaction that required hospitalization: Unknown Has patient had a PCN reaction occurring within the last 10 years: No If all of the above answers are "NO", then may proceed with Cephalosporin use.     Current Outpatient Medications  Medication Sig Dispense Refill  . ALPRAZolam (XANAX) 1 MG tablet Take 0.5-1 mg by mouth 2 (two) times daily. Take 0.5 mg in the morning and 1 mg at bedtime    . clopidogrel (PLAVIX) 75 MG tablet Take by mouth.    . cyanocobalamin (V-R VITAMIN B-12) 500 MCG tablet Take 500 mcg by mouth daily.     . ferrous gluconate (FERGON) 240 (27 FE) MG tablet Take by mouth.    . gabapentin (NEURONTIN) 100 MG capsule Take by mouth.    . levothyroxine (SYNTHROID, LEVOTHROID) 50 MCG tablet Take 50 mcg by mouth daily before breakfast.     . losartan (COZAAR) 50 MG tablet Take 50 mg daily by mouth.    . metoprolol tartrate (LOPRESSOR) 25 MG tablet Take 25 mg by mouth 2 (two) times daily.    . pravastatin (PRAVACHOL) 20 MG tablet Take by mouth.    . pregabalin (LYRICA) 50 MG capsule Take by mouth.    . Probiotic Product (PROBIOTIC DAILY PO) Take by mouth daily.    Marland Kitchen dexlansoprazole (DEXILANT) 60 MG capsule Take 60 mg by mouth.    . Difluprednate (DUREZOL) 0.05 % EMUL Place 1 drop into the left eye 2 (two) times daily. Starting after the procedure    . DULoxetine (CYMBALTA) 30 MG capsule Take 30 mg by mouth.    . metoprolol tartrate (LOPRESSOR) 25 MG tablet Take 25 mg by mouth 2 (two) times daily.     . Multiple Vitamin (MULTIVITAMIN WITH MINERALS) TABS tablet Take 1 tablet by mouth daily.    Marland Kitchen omeprazole (PRILOSEC) 40 MG capsule Take 40 mg by mouth.    . psyllium  (METAMUCIL) 58.6 % powder Take 1 packet by mouth 3 (three) times daily.      No current facility-administered medications for this visit.       REVIEW OF SYSTEMS (Negative unless checked)  Constitutional: [] Weight loss  [] Fever  [] Chills Cardiac: [] Chest pain   [] Chest pressure   [] Palpitations   [] Shortness of breath when laying flat   [] Shortness of breath at rest   [] Shortness of breath with exertion. Vascular:  [x] Pain in legs with walking   [] Pain in legs at rest   [] Pain in legs when laying flat   [] Claudication   [x] Pain in feet when walking  [x] Pain in feet at rest  [  x]Pain in feet when laying flat   [] History of DVT   [] Phlebitis   [x] Swelling in legs   [] Varicose veins   [] Non-healing ulcers Pulmonary:   [] Uses home oxygen   [] Productive cough   [] Hemoptysis   [] Wheeze  [] COPD   [] Asthma Neurologic:  [] Dizziness  [] Blackouts   [] Seizures   [x] History of stroke   [x] History of TIA  [] Aphasia   [] Temporary blindness   [] Dysphagia   [] Weakness or numbness in arms   [] Weakness or numbness in legs Musculoskeletal:  [x] Arthritis   [] Joint swelling   [x] Joint pain   [] Low back pain Hematologic:  [] Easy bruising  [] Easy bleeding   [] Hypercoagulable state   [] Anemic  [] Hepatitis Gastrointestinal:  [] Blood in stool   [] Vomiting blood  [x] Gastroesophageal reflux/heartburn   [] Abdominal pain Genitourinary:  [] Chronic kidney disease   [] Difficult urination  [] Frequent urination  [] Burning with urination   [] Hematuria Skin:  [] Rashes   [] Ulcers   [] Wounds Psychological:  [] History of anxiety   []  History of major depression.    Physical Exam BP (!) 160/89 (BP Location: Right Arm)   Pulse 67   Resp 16   Wt 144 lb (65.3 kg)   BMI 27.21 kg/m  Gen:  WD/WN, NAD.  Appears younger than stated age Head: [place]/AT, No temporalis wasting.  Ear/Nose/Throat: Hearing grossly intact, nares w/o erythema or drainage, oropharynx w/o Erythema/Exudate Eyes: Conjunctiva clear, sclera non-icteric  Neck: trachea  midline.  No JVD.  Pulmonary:  Good air movement, respirations not labored, no use of accessory muscles  Cardiac: RRR, no JVD Vascular:  Vessel Right Left  Radial Palpable Palpable                          DP  1+  1+  PT  trace  1+   Gastrointestinal:. No masses, surgical incisions, or scars. Musculoskeletal: M/S 5/5 throughout.  Extremities without ischemic changes.  No deformity or atrophy.  Mild bilateral lower extremity edema. Neurologic: Sensation grossly intact in extremities.  Symmetrical.  Speech is fluent. Motor exam as listed above. Psychiatric: Judgment intact, Mood & affect appropriate for pt's clinical situation. Dermatologic: No rashes or ulcers noted.  No cellulitis or open wounds.    Radiology No results found.  Labs No results found for this or any previous visit (from the past 2160 hour(s)).  Assessment/Plan:  Essential hypertension blood pressure control important in reducing the progression of atherosclerotic disease. On appropriate oral medications.   Swelling of limb I have had a long discussion with the patient regarding swelling and why it  causes symptoms.  Patient has started using compression stockings under the guidance of her podiatrist with mild improvement at best so far.  The patient is instructed specifically not to sleep in the stockings.  In addition, behavioral modification will be initiated.  This will include frequent elevation, use of over the counter pain medications and exercise such as walking. I have reviewed systemic causes for chronic edema such as liver, kidney and cardiac etiologies.  The patient denies problems with these organ systems.   Consideration for a lymph pump will also be made based upon the effectiveness of conservative therapy.  This would help to improve the edema control and prevent sequela such as ulcers and infections  Patient should undergo duplex ultrasound of the venous system to ensure that DVT or reflux is  not present. The patient will follow-up with me after the ultrasound.    Pain in  limb  Recommend:  The patient has atypical pain symptoms for pure atherosclerotic disease. However, on physical exam there is evidence of mixed venous and arterial disease, given the diminished pulses and the edema associated with venous changes of the legs.  Noninvasive studies including ABI's and venous ultrasound of the legs will be obtained and the patient will follow up with me to review these studies.  The patient should continue walking and begin a more formal exercise program. The patient should continue his antiplatelet therapy and aggressive treatment of the lipid abnormalities.  The patient should continue graduated compression socks to control edema.       Leotis Pain 03/05/2019, 11:47 AM   This note was created with Dragon medical transcription system.  Any errors from dictation are unintentional.

## 2019-03-05 NOTE — Assessment & Plan Note (Addendum)
Recommend:  The patient has atypical pain symptoms for pure atherosclerotic disease. However, on physical exam there is evidence of mixed venous and arterial disease, given the diminished pulses and the edema associated with venous changes of the legs.  Noninvasive studies including ABI's and venous ultrasound of the legs will be obtained and the patient will follow up with me to review these studies.  The patient should continue walking and begin a more formal exercise program. The patient should continue his antiplatelet therapy and aggressive treatment of the lipid abnormalities.  The patient should continue graduated compression socks to control edema.

## 2019-03-05 NOTE — Assessment & Plan Note (Signed)
blood pressure control important in reducing the progression of atherosclerotic disease. On appropriate oral medications.  

## 2019-03-05 NOTE — Patient Instructions (Signed)
Peripheral Vascular Disease  Peripheral vascular disease (PVD) is a disease of the blood vessels that are not part of your heart and brain. A simple term for PVD is poor circulation. In most cases, PVD narrows the blood vessels that carry blood from your heart to the rest of your body. This can reduce the supply of blood to your arms, legs, and internal organs, like your stomach or kidneys. However, PVD most often affects a person's lower legs and feet. Without treatment, PVD tends to get worse. PVD can also lead to acute ischemic limb. This is when an arm or leg suddenly cannot get enough blood. This is a medical emergency. Follow these instructions at home: Lifestyle  Do not use any products that contain nicotine or tobacco, such as cigarettes and e-cigarettes. If you need help quitting, ask your doctor.  Lose weight if you are overweight. Or, stay at a healthy weight as told by your doctor.  Eat a diet that is low in fat and cholesterol. If you need help, ask your doctor.  Exercise regularly. Ask your doctor for activities that are right for you. General instructions  Take over-the-counter and prescription medicines only as told by your doctor.  Take good care of your feet: ? Wear comfortable shoes that fit well. ? Check your feet often for any cuts or sores.  Keep all follow-up visits as told by your doctor This is important. Contact a doctor if:  You have cramps in your legs when you walk.  You have leg pain when you are at rest.  You have coldness in a leg or foot.  Your skin changes.  You are unable to get or have an erection (erectile dysfunction).  You have cuts or sores on your feet that do not heal. Get help right away if:  Your arm or leg turns cold, numb, and blue.  Your arms or legs become red, warm, swollen, painful, or numb.  You have chest pain.  You have trouble breathing.  You suddenly have weakness in your face, arm, or leg.  You become very  confused or you cannot speak.  You suddenly have a very bad headache.  You suddenly cannot see. Summary  Peripheral vascular disease (PVD) is a disease of the blood vessels.  A simple term for PVD is poor circulation. Without treatment, PVD tends to get worse.  Treatment may include exercise, low fat and low cholesterol diet, and quitting smoking. This information is not intended to replace advice given to you by your health care provider. Make sure you discuss any questions you have with your health care provider. Document Released: 07/27/2009 Document Revised: 04/14/2017 Document Reviewed: 06/09/2016 [employer] Patient Education  2020 [employer]  

## 2019-03-15 ENCOUNTER — Encounter (INDEPENDENT_AMBULATORY_CARE_PROVIDER_SITE_OTHER): Payer: Self-pay | Admitting: Nurse Practitioner

## 2019-03-15 ENCOUNTER — Ambulatory Visit (INDEPENDENT_AMBULATORY_CARE_PROVIDER_SITE_OTHER): Payer: Medicare Other | Admitting: Nurse Practitioner

## 2019-03-15 ENCOUNTER — Other Ambulatory Visit: Payer: Self-pay

## 2019-03-15 ENCOUNTER — Ambulatory Visit (INDEPENDENT_AMBULATORY_CARE_PROVIDER_SITE_OTHER): Payer: Medicare Other

## 2019-03-15 VITALS — BP 176/91 | HR 71 | Resp 16 | Wt 146.8 lb

## 2019-03-15 DIAGNOSIS — M79604 Pain in right leg: Secondary | ICD-10-CM

## 2019-03-15 DIAGNOSIS — M7989 Other specified soft tissue disorders: Secondary | ICD-10-CM | POA: Diagnosis not present

## 2019-03-15 DIAGNOSIS — M79605 Pain in left leg: Secondary | ICD-10-CM

## 2019-03-15 DIAGNOSIS — I10 Essential (primary) hypertension: Secondary | ICD-10-CM | POA: Diagnosis not present

## 2019-03-15 NOTE — Progress Notes (Signed)
SUBJECTIVE:  Patient ID: Tracie Fisher, female    DOB: [birth year], 83 y.o.   MRN: [id number] Chief Complaint  Patient presents with  . Follow-up    ultrasound follow up    HPI  Tracie Fisher is a 83 y.o. female that presents to the office for noninvasive studies to evaluate lower extremity pain and swelling.  The pain has been going on for approximately a year now.  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  She states that the medication is minimally helpful.  The patient also endorses a history of multiple back surgeries including a rod in her back.  The patient also sees a chiropractor on a weekly basis due to back issues.  The patient states that she has some constant aching pain and it is even worse when the patient walks short distances.  She does note that the pain and swelling are better when she wears compression stockings although these are uncomfortable.  The patient does endorse that when she is at home she rarely elevates her lower extremities she mostly sits in the dependent position.  The patient has no precipitating event or timeline of what her pain is actually started.  She denies any fever, chills, nausea, vomiting or diarrhea.  She denies any chest pain or shortness of breath.  Today the patient underwent bilateral ABIs.  Her right ABI was 1.08 with a TBI of 0.84.  The left ABI was 1.05 with a TBI 0.81.  The patient had triphasic waveforms in the bilateral tibial arteries with strong toe waveforms bilaterally.  The patient also underwent a bilateral venous reflux study which revealed that the patient had no evidence of chronic venous insufficiency bilaterally.  Patient also had no evidence of DVT or superficial venous thrombosis.  Past Medical History:  Diagnosis Date  . Cancer ([place])    SKIN  . Complication of anesthesia   . Depression   . Dysrhythmia   . GERD (gastroesophageal reflux disease)   . Hiatal hernia   . HOH (hard of  hearing)   . Hypertension   . Hypothyroidism   . PONV (postoperative nausea and vomiting)   . Thyroid disease     Past Surgical History:  Procedure Laterality Date  . APPENDECTOMY    . BACK SURGERY     MULTIPLE/ROD IN BACK  . BREAST CYST ASPIRATION Left "years ago"  . CATARACT EXTRACTION W/PHACO Left 03/21/2017   Procedure: CATARACT EXTRACTION PHACO AND INTRAOCULAR LENS PLACEMENT (IOC);  Surgeon: Birder Robson, MD;  Location: [facility] ORS;  Service: Ophthalmology;  Laterality: Left;  [place] 00:48 AP% 17.8 CDE 8.57 Fluid pack lot # [id number]  . CATARACT EXTRACTION W/PHACO Right 04/11/2017   Procedure: CATARACT EXTRACTION PHACO AND INTRAOCULAR LENS PLACEMENT ([place]);  Surgeon: Birder Robson, MD;  Location: [facility] ORS;  Service: Ophthalmology;  Laterality: Right;  [place] 01:09.5 AP% 15.8 CDE 11.04 Fluid Pack lot # [id number]  . CHOLECYSTECTOMY    . COLON SURGERY     BOWEL OBSTRUCTION  . COLONOSCOPY WITH PROPOFOL N/A 04/25/2016   Procedure: COLONOSCOPY WITH PROPOFOL;  Surgeon: Jonathon Bellows, MD;  Location: [facility] ENDOSCOPY;  Service: Endoscopy;  Laterality: N/A;  . ESOPHAGOGASTRODUODENOSCOPY (EGD) WITH PROPOFOL N/A 04/25/2016   Procedure: ESOPHAGOGASTRODUODENOSCOPY (EGD) WITH PROPOFOL;  Surgeon: Jonathon Bellows, MD;  Location: [facility] ENDOSCOPY;  Service: Endoscopy;  Laterality: N/A;  . EXPLORATORY LAPAROTOMY  10/18/2011   blockage small intestines  . GIVENS CAPSULE STUDY N/A 05/11/2016   Procedure: GIVENS CAPSULE  STUDY;  Surgeon: Jonathon Bellows, MD;  Location: [facility] ENDOSCOPY;  Service: Endoscopy;  Laterality: N/A;    Social History   Socioeconomic History  . Marital status: Single    Spouse name: Not on file  . Number of children: Not on file  . Years of education: Not on file  . Highest education level: Not on file  Occupational History  . Not on file  Social Needs  . Financial resource strain: Not on file  . Food insecurity    Worry: Not on file    Inability: Not on file  . Transportation needs     Medical: Not on file    Non-medical: Not on file  Tobacco Use  . Smoking status: Former Smoker    Years: 20.00  . Smokeless tobacco: Never Used  Substance and Sexual Activity  . Alcohol use: Yes    Comment: 1 beer /day or 2 occ  . Drug use: No  . Sexual activity: Yes    Birth control/protection: Post-menopausal  Lifestyle  . Physical activity    Days per week: Not on file    Minutes per session: Not on file  . Stress: Not on file  Relationships  . Social Herbalist on phone: Not on file    Gets together: Not on file    Attends religious service: Not on file    Active member of club or organization: Not on file    Attends meetings of clubs or organizations: Not on file    Relationship status: Not on file  . Intimate partner violence    Fear of current or ex partner: Not on file    Emotionally abused: Not on file    Physically abused: Not on file    Forced sexual activity: Not on file  Other Topics Concern  . Not on file  Social History Narrative  . Not on file    Family History  Problem Relation Age of Onset  . Breast cancer Sister 67  . Heart attack Brother   . Heart disease Father     Allergies  Allergen Reactions  . Aspirin Itching  . Penicillins Other (See Comments)    Unknown reaction Has patient had a PCN reaction causing immediate rash, facial/tongue/throat swelling, SOB or lightheadedness with hypotension: Unknown Has patient had a PCN reaction causing severe rash involving mucus membranes or skin necrosis: Unknown Has patient had a PCN reaction that required hospitalization: Unknown Has patient had a PCN reaction occurring within the last 10 years: No If all of the above answers are "NO", then may proceed with Cephalosporin use. Patient does not remember the reaction to penicillin.  Marland Kitchen Penicillin G     Unknown reaction Has patient had a PCN reaction causing immediate rash, facial/tongue/throat swelling, SOB or lightheadedness with  hypotension: Unknown Has patient had a PCN reaction causing severe rash involving mucus membranes or skin necrosis: Unknown Has patient had a PCN reaction that required hospitalization: Unknown Has patient had a PCN reaction occurring within the last 10 years: No If all of the above answers are "NO", then may proceed with Cephalosporin use.      Review of Systems   Review of Systems: Negative Unless Checked Constitutional: [] Weight loss  [] Fever  [] Chills Cardiac: [] Chest pain   []  Atrial Fibrillation  [] Palpitations   [] Shortness of breath when laying flat   [] Shortness of breath with exertion. [] Shortness of breath at rest Vascular:  [] Pain in legs with walking   []   Pain in legs with standing [x] Pain in legs when laying flat   [x] Claudication    [x] Pain in feet when laying flat    [] History of DVT   [] Phlebitis   [x] Swelling in legs   [x] Varicose veins   [] Non-healing ulcers Pulmonary:   [] Uses home oxygen   [] Productive cough   [] Hemoptysis   [] Wheeze  [] COPD   [] Asthma Neurologic:  [] Dizziness   [] Seizures  [] Blackouts [x] History of stroke   [x] History of TIA  [] Aphasia   [] Temporary Blindness   [] Weakness or numbness in arm   [] Weakness or numbness in leg Musculoskeletal:   [x] Joint swelling   [] Joint pain   [] Low back pain  []  History of Knee Replacement [x] Arthritis [x] back Surgeries  []  Spinal Stenosis    Hematologic:  [] Easy bruising  [] Easy bleeding   [] Hypercoagulable state   [] Anemic Gastrointestinal:  [] Diarrhea   [] Vomiting  [x] Gastroesophageal reflux/heartburn   [] Difficulty swallowing. [] Abdominal pain Genitourinary:  [] Chronic kidney disease   [] Difficult urination  [] Anuric   [] Blood in urine [] Frequent urination  [] Burning with urination   [] Hematuria Skin:  [] Rashes   [] Ulcers [] Wounds Psychological:  [] History of anxiety   []  History of major depression  []  Memory Difficulties      OBJECTIVE:   Physical Exam  BP (!) 176/91 (BP Location: Right Arm)   Pulse 71   Resp  16   Wt 146 lb 12.8 oz (66.6 kg)   BMI 27.74 kg/m   Gen: WD/WN, NAD Head: [place]/AT, No temporalis wasting.  Ear/Nose/Throat: Hearing grossly intact, nares w/o erythema or drainage Eyes: PER, EOMI, sclera nonicteric.  Neck: Supple, no masses.  No JVD.  Pulmonary:  Good air movement, no use of accessory muscles.  Cardiac: RRR Vascular:  Mild edema bilaterally, bilateral feet warm with good capillary refill Vessel Right Left  Radial Palpable Palpable  Dorsalis Pedis Palpable Palpable  Posterior Tibial Palpable Palpable   Gastrointestinal: soft, non-distended. No guarding/no peritoneal signs.  Musculoskeletal: M/S 5/5 throughout.  No deformity or atrophy.  Neurologic: Pain and light touch intact in extremities.  Symmetrical.  Speech is fluent. Motor exam as listed above. Psychiatric: Judgment intact, Mood & affect appropriate for pt's clinical situation. Dermatologic: No Venous rashes. No Ulcers Noted.  No changes consistent with cellulitis. Lymph : No Cervical lymphadenopathy, no lichenification or skin changes of chronic lymphedema.       ASSESSMENT AND PLAN:  1. Pain in both lower extremities Recommend:  I do not find evidence of Vascular pathology that would explain the patient's symptoms  The patient has atypical pain symptoms for vascular disease  I do not find evidence of Vascular pathology that would explain the patient's symptoms and I suspect the patient is c/o neurogenic claudication.  Patient should have an evaluation of his LS spine which I defer to the primary service.  Noninvasive studies including venous ultrasound of the legs do not identify vascular problems  The patient should continue walking and begin a more formal exercise program. The patient should continue his antiplatelet therapy and aggressive treatment of the lipid abnormalities. The patient should begin wearing graduated compression socks 15-20 mmHg strength to control her mild edema.  Patient will  follow-up with me on a PRN basis  Further work-up of her lower extremity pain is deferred to the primary service     2. Swelling of limb No surgery or intervention at this point in time.  I have reviewed my discussion with the patient regarding venous insufficiency and why it causes  symptoms. I have discussed with the patient the chronic skin changes that accompany venous insufficiency and the long term sequela such as ulceration. Patient will contnue wearing graduated compression stockings on a daily basis, as this has provided excellent control of his edema. The patient will put the stockings on first thing in the morning and removing them in the evening. The patient is reminded not to sleep in the stockings.  In addition, behavioral modification including elevation during the day will be initiated. Exercise is strongly encouraged.  Duplex ultrasound of the bilateral lower extremity shows no evidence of chronic venous insufficiency, no DVT or superficial venous thrombosis.  Given the patient's good control and lack of any problems regarding the venous insufficiency and lymphedema a lymph pump in not need at this time.  The patient will follow up with me PRN should anything change.  The patient voices agreement with this plan.   3. Essential hypertension Continue antihypertensive medications as already ordered, these medications have been reviewed and there are no changes at this time.    Current Outpatient Medications on File Prior to Visit  Medication Sig Dispense Refill  . ALPRAZolam (XANAX) 1 MG tablet Take 0.5-1 mg by mouth 2 (two) times daily. Take 0.5 mg in the morning and 1 mg at bedtime    . clopidogrel (PLAVIX) 75 MG tablet Take by mouth.    . cyanocobalamin (V-R VITAMIN B-12) 500 MCG tablet Take 500 mcg by mouth daily.     Marland Kitchen dexlansoprazole (DEXILANT) 60 MG capsule Take 60 mg by mouth.    . Difluprednate (DUREZOL) 0.05 % EMUL Place 1 drop into the left eye 2 (two) times  daily. Starting after the procedure    . DULoxetine (CYMBALTA) 30 MG capsule Take 30 mg by mouth.    . ferrous gluconate (FERGON) 240 (27 FE) MG tablet Take by mouth.    . gabapentin (NEURONTIN) 100 MG capsule Take by mouth.    . levothyroxine (SYNTHROID, LEVOTHROID) 50 MCG tablet Take 50 mcg by mouth daily before breakfast.     . losartan (COZAAR) 50 MG tablet Take 50 mg daily by mouth.    . metoprolol tartrate (LOPRESSOR) 25 MG tablet Take 25 mg by mouth 2 (two) times daily.    . Multiple Vitamin (MULTIVITAMIN WITH MINERALS) TABS tablet Take 1 tablet by mouth daily.    Marland Kitchen omeprazole (PRILOSEC) 40 MG capsule Take 40 mg by mouth.    . pravastatin (PRAVACHOL) 20 MG tablet Take by mouth.    . pregabalin (LYRICA) 50 MG capsule Take by mouth.    . Probiotic Product (PROBIOTIC DAILY PO) Take by mouth daily.    . psyllium (METAMUCIL) 58.6 % powder Take 1 packet by mouth 3 (three) times daily.     . metoprolol tartrate (LOPRESSOR) 25 MG tablet Take 25 mg by mouth 2 (two) times daily.      No current facility-administered medications on file prior to visit.     There are no Patient Instructions on file for this visit. No follow-ups on file.   Kris Hartmann, NP  This note was completed with Sales executive.  Any errors are purely unintentional.

## 2019-05-01 ENCOUNTER — Ambulatory Visit: Payer: Medicare Other | Attending: Internal Medicine

## 2019-05-01 ENCOUNTER — Other Ambulatory Visit: Payer: Self-pay

## 2019-05-01 DIAGNOSIS — Z20822 Contact with and (suspected) exposure to covid-19: Secondary | ICD-10-CM

## 2019-05-02 ENCOUNTER — Telehealth: Payer: Self-pay

## 2019-05-02 NOTE — Telephone Encounter (Signed)
Caller advise that result not back yet

## 2019-05-03 ENCOUNTER — Telehealth: Payer: Self-pay

## 2019-05-03 LAB — NOVEL CORONAVIRUS, NAA: SARS-CoV-2, NAA: DETECTED — AB

## 2019-05-03 NOTE — Telephone Encounter (Signed)
Phone call returned to pt.  Gave COVID results/ recommendations.   See result note.

## 2019-05-04 ENCOUNTER — Telehealth: Payer: Self-pay | Admitting: Infectious Diseases

## 2019-05-04 NOTE — Telephone Encounter (Signed)
Called to discuss with patient about Covid symptoms and the use of bamlanivimab, a monoclonal antibody infusion for those with mild to moderate Covid symptoms and at a high risk of hospitalization.  Pt is qualified for this infusion at the [facility] infusion center due to Age > 66.    Message left to call back   Janene Madeira, MSN, NP-C [facility] for [email or web address] [email or web address] Pager: [phone] Office: [phone] [place]: [phone]

## 2019-05-27 ENCOUNTER — Other Ambulatory Visit: Payer: Self-pay | Admitting: Internal Medicine

## 2019-05-27 DIAGNOSIS — Z1231 Encounter for screening mammogram for malignant neoplasm of breast: Secondary | ICD-10-CM

## 2019-06-18 ENCOUNTER — Ambulatory Visit
Admission: RE | Admit: 2019-06-18 | Discharge: 2019-06-18 | Disposition: A | Discharge status: Home / Self Care | Payer: Medicare Other | Source: Ambulatory Visit | Attending: Internal Medicine | Admitting: Internal Medicine

## 2019-06-18 DIAGNOSIS — Z1231 Encounter for screening mammogram for malignant neoplasm of breast: Secondary | ICD-10-CM | POA: Diagnosis present

## 2019-10-13 ENCOUNTER — Emergency Department
Admission: EM | Admit: 2019-10-13 | Discharge: 2019-10-14 | Disposition: A | Discharge status: Home / Self Care | Payer: Medicare Other | Attending: Emergency Medicine | Admitting: Emergency Medicine

## 2019-10-13 ENCOUNTER — Emergency Department: Payer: Medicare Other

## 2019-10-13 ENCOUNTER — Other Ambulatory Visit: Payer: Self-pay

## 2019-10-13 DIAGNOSIS — I10 Essential (primary) hypertension: Secondary | ICD-10-CM | POA: Diagnosis not present

## 2019-10-13 DIAGNOSIS — Z87891 Personal history of nicotine dependence: Secondary | ICD-10-CM | POA: Diagnosis not present

## 2019-10-13 DIAGNOSIS — Z85828 Personal history of other malignant neoplasm of skin: Secondary | ICD-10-CM | POA: Insufficient documentation

## 2019-10-13 DIAGNOSIS — Z79899 Other long term (current) drug therapy: Secondary | ICD-10-CM | POA: Diagnosis not present

## 2019-10-13 DIAGNOSIS — E875 Hyperkalemia: Secondary | ICD-10-CM | POA: Diagnosis not present

## 2019-10-13 DIAGNOSIS — H532 Diplopia: Secondary | ICD-10-CM | POA: Diagnosis present

## 2019-10-13 DIAGNOSIS — Z7902 Long term (current) use of antithrombotics/antiplatelets: Secondary | ICD-10-CM | POA: Insufficient documentation

## 2019-10-13 DIAGNOSIS — E039 Hypothyroidism, unspecified: Secondary | ICD-10-CM | POA: Insufficient documentation

## 2019-10-13 LAB — CBC
HCT: 41.3 % (ref 36.0–46.0)
Hemoglobin: 14.1 g/dL (ref 12.0–15.0)
MCH: 32.9 pg (ref 26.0–34.0)
MCHC: 34.1 g/dL (ref 30.0–36.0)
MCV: 96.3 fL (ref 80.0–100.0)
Platelets: 239 10*3/uL (ref 150–400)
RBC: 4.29 MIL/uL (ref 3.87–5.11)
RDW: 13.2 % (ref 11.5–15.5)
WBC: 6.4 10*3/uL (ref 4.0–10.5)
nRBC: 0 % (ref 0.0–0.2)

## 2019-10-13 LAB — COMPREHENSIVE METABOLIC PANEL
ALT: 19 U/L (ref 0–44)
AST: 28 U/L (ref 15–41)
Albumin: 4.1 g/dL (ref 3.5–5.0)
Alkaline Phosphatase: 62 U/L (ref 38–126)
Anion gap: 7 (ref 5–15)
BUN: 28 mg/dL — ABNORMAL HIGH (ref 8–23)
CO2: 27 mmol/L (ref 22–32)
Calcium: 9.4 mg/dL (ref 8.9–10.3)
Chloride: 100 mmol/L (ref 98–111)
Creatinine, Ser: 1.06 mg/dL — ABNORMAL HIGH (ref 0.44–1.00)
GFR calc Af Amer: 57 mL/min — ABNORMAL LOW (ref >60)
GFR calc non Af Amer: 49 mL/min — ABNORMAL LOW (ref >60)
Glucose, Bld: 88 mg/dL (ref 70–99)
Potassium: 5.6 mmol/L — ABNORMAL HIGH (ref 3.5–5.1)
Sodium: 134 mmol/L — ABNORMAL LOW (ref 135–145)
Total Bilirubin: 0.7 mg/dL (ref 0.3–1.2)
Total Protein: 7.2 g/dL (ref 6.5–8.1)

## 2019-10-13 LAB — BASIC METABOLIC PANEL
Anion gap: 10 (ref 5–15)
BUN: 27 mg/dL — ABNORMAL HIGH (ref 8–23)
CO2: 25 mmol/L (ref 22–32)
Calcium: 9.7 mg/dL (ref 8.9–10.3)
Chloride: 103 mmol/L (ref 98–111)
Creatinine, Ser: 1.14 mg/dL — ABNORMAL HIGH (ref 0.44–1.00)
GFR calc Af Amer: 52 mL/min — ABNORMAL LOW (ref >60)
GFR calc non Af Amer: 45 mL/min — ABNORMAL LOW (ref >60)
Glucose, Bld: 79 mg/dL (ref 70–99)
Potassium: 5.5 mmol/L — ABNORMAL HIGH (ref 3.5–5.1)
Sodium: 138 mmol/L (ref 135–145)

## 2019-10-13 NOTE — ED Notes (Signed)
Pt reports vision changes before medication changes  Pt reports double vision intermittently for the past "couple months," pt reports double vision constantly for the past "couple weeks" with worsening today.   Pt reports seeing doubles, but not as much up close. Pt reports seeing changes when watching tv and looking at pictures in lobby.   Pt reports mild doubling to far right vision with left eye covered.

## 2019-10-13 NOTE — ED Triage Notes (Signed)
Pt states double vision x few weeks. Has seen eye doctor and says that everything was okay. States vision cleared up and came up a few days ago. A&O, ambulatory. Speaking in clear complete sentences.

## 2019-10-13 NOTE — ED Provider Notes (Signed)
[facility] Emergency Department Provider Note ____________________________________________   First MD Initiated Contact with Patient 10/13/19 1921     (approximate)  I have reviewed the triage vital signs and the nursing notes.   HISTORY  Chief Complaint Eye Problem  HPI Tracie Fisher is a 84 y.o. female who presents to the emergency department for treatment and evaluation of double vision. This has been ongoing for the past couple of weeks. She was evaluated by her eye doctor and told that all looked well and to come to the ER if symptoms did not go away or got worse. She denies eye pain. She has not felt weak or dizzy. She denies headache or other concerning symptoms. No recent changes to her medications.           Past Medical History:  Diagnosis Date  . Cancer ([place])    SKIN  . Complication of anesthesia   . Depression   . Dysrhythmia   . GERD (gastroesophageal reflux disease)   . Hiatal hernia   . HOH (hard of hearing)   . Hypertension   . Hypothyroidism   . PONV (postoperative nausea and vomiting)   . Thyroid disease     Patient Active Problem List   Diagnosis Date Noted  . Swelling of limb 03/05/2019  . Pain in limb 03/05/2019  . History of stroke 03/04/2019  . Polyneuropathy 07/31/2018  . Bilateral foot pain 03/06/2018  . Chronic bilateral low back pain 03/06/2018  . Neck pain 03/06/2018  . Neuropathy 02/06/2018  . History of nonmelanoma skin cancer 09/27/2016  . Essential hypertension 09/02/2016  . Hiatal hernia   . Stricture of esophagus   . Absolute anemia   . Iron deficiency anemia due to chronic blood loss 03/23/2016  . Generalized abdominal pain 03/23/2016  . History of tachycardia 07/29/2015  . SOB (shortness of breath) on exertion 07/29/2015  . BCC (basal cell carcinoma), eyelid 02/11/2013    Past Surgical History:  Procedure Laterality Date  . APPENDECTOMY    . BACK SURGERY     MULTIPLE/ROD IN BACK  . BREAST  CYST ASPIRATION Left "years ago"  . CATARACT EXTRACTION W/PHACO Left 03/21/2017   Procedure: CATARACT EXTRACTION PHACO AND INTRAOCULAR LENS PLACEMENT (IOC);  Surgeon: Birder Robson, MD;  Location: [facility] ORS;  Service: Ophthalmology;  Laterality: Left;  [place] 00:48 AP% 17.8 CDE 8.57 Fluid pack lot # [id number]  . CATARACT EXTRACTION W/PHACO Right 04/11/2017   Procedure: CATARACT EXTRACTION PHACO AND INTRAOCULAR LENS PLACEMENT ([place]);  Surgeon: Birder Robson, MD;  Location: [facility] ORS;  Service: Ophthalmology;  Laterality: Right;  [place] 01:09.5 AP% 15.8 CDE 11.04 Fluid Pack lot # [id number]  . CHOLECYSTECTOMY    . COLON SURGERY     BOWEL OBSTRUCTION  . COLONOSCOPY WITH PROPOFOL N/A 04/25/2016   Procedure: COLONOSCOPY WITH PROPOFOL;  Surgeon: Jonathon Bellows, MD;  Location: [facility] ENDOSCOPY;  Service: Endoscopy;  Laterality: N/A;  . ESOPHAGOGASTRODUODENOSCOPY (EGD) WITH PROPOFOL N/A 04/25/2016   Procedure: ESOPHAGOGASTRODUODENOSCOPY (EGD) WITH PROPOFOL;  Surgeon: Jonathon Bellows, MD;  Location: [facility] ENDOSCOPY;  Service: Endoscopy;  Laterality: N/A;  . EXPLORATORY LAPAROTOMY  10/18/2011   blockage small intestines  . GIVENS CAPSULE STUDY N/A 05/11/2016   Procedure: GIVENS CAPSULE STUDY;  Surgeon: Jonathon Bellows, MD;  Location: [facility] ENDOSCOPY;  Service: Endoscopy;  Laterality: N/A;    Prior to Admission medications   Medication Sig Start Date End Date Taking? Authorizing Provider  ALPRAZolam Duanne Moron) 1 MG tablet Take 0.5-1 mg by mouth  2 (two) times daily. Take 0.5 mg in the morning and 1 mg at bedtime    [provider]  clopidogrel (PLAVIX) 75 MG tablet Take by mouth. 11/21/18   [provider]  cyanocobalamin (V-R VITAMIN B-12) 500 MCG tablet Take 500 mcg by mouth daily.     [provider]  dexlansoprazole (DEXILANT) 60 MG capsule Take 60 mg by mouth.    [provider]  Difluprednate (DUREZOL) 0.05 % EMUL Place 1 drop into the left eye 2 (two) times daily. Starting after the  procedure    [provider]  DULoxetine (CYMBALTA) 30 MG capsule Take 30 mg by mouth.    [provider]  ferrous gluconate (FERGON) 240 (27 FE) MG tablet Take by mouth.    [provider]  gabapentin (NEURONTIN) 100 MG capsule Take by mouth. 02/22/18   [provider]  levothyroxine (SYNTHROID, LEVOTHROID) 50 MCG tablet Take 50 mcg by mouth daily before breakfast.     [provider]  losartan (COZAAR) 50 MG tablet Take 50 mg daily by mouth.    [provider]  metoprolol tartrate (LOPRESSOR) 25 MG tablet Take 25 mg by mouth 2 (two) times daily.  04/01/16 04/04/17  [provider]  metoprolol tartrate (LOPRESSOR) 25 MG tablet Take 25 mg by mouth 2 (two) times daily.    [provider]  Multiple Vitamin (MULTIVITAMIN WITH MINERALS) TABS tablet Take 1 tablet by mouth daily.    [provider]  omeprazole (PRILOSEC) 40 MG capsule Take 40 mg by mouth. 06/03/16   [provider]  pravastatin (PRAVACHOL) 20 MG tablet Take by mouth. 11/21/18   [provider]  pregabalin (LYRICA) 50 MG capsule Take by mouth. 03/04/19 04/03/19  [provider]  Probiotic Product (PROBIOTIC DAILY PO) Take by mouth daily.    [provider]  psyllium (METAMUCIL) 58.6 % powder Take 1 packet by mouth 3 (three) times daily.     [provider]    Allergies Aspirin, Penicillins, and Penicillin g  Family History  Problem Relation Age of Onset  . Breast cancer Sister 67  . Heart attack Brother   . Heart disease Father     Social History Social History   Tobacco Use  . Smoking status: Former Smoker    Years: 20.00  . Smokeless tobacco: Never Used  Substance Use Topics  . Alcohol use: Yes    Comment: 1 beer /day or 2 occ  . Drug use: No    Review of Systems  Constitutional: No fever/chills Eyes: Positive for diplopia  ENT: No sore throat. Cardiovascular: Denies chest  pain. Respiratory: Denies shortness of breath. Gastrointestinal: No abdominal pain.  No nausea, no vomiting.  No diarrhea.  No constipation. Genitourinary: Negative for dysuria. Musculoskeletal: Negative for back pain. Skin: Negative for rash. Neurological: Negative for headaches, focal weakness or numbness. ____________________________________________   PHYSICAL EXAM:  VITAL SIGNS: ED Triage Vitals  Enc Vitals Group     BP 10/13/19 1555 (!) 147/66     Pulse Rate 10/13/19 1555 61     Resp 10/13/19 1555 16     Temp 10/13/19 1555 98.4 F (36.9 C)     Temp Source 10/13/19 1555 Oral     SpO2 10/13/19 1555 97 %     Weight 10/13/19 1553 140 lb (63.5 kg)     Height 10/13/19 1553 5\' 1"  (1.549 m)     Head Circumference --      Peak  Flow --      Pain Score 10/13/19 1553 0     Pain Loc --      Pain Edu? --      Excl. in [place]? --     Constitutional: Alert and oriented. Well appearing and in no acute distress. Eyes: Conjunctivae are normal. PERRL. EOMI. Lateral nystagmus. Head: Atraumatic. Nose: No congestion/rhinnorhea. Mouth/Throat: Mucous membranes are moist.  Oropharynx non-erythematous. Neck: No stridor.   Hematological/Lymphatic/Immunilogical: No cervical lymphadenopathy. Cardiovascular: Normal rate, regular rhythm. Grossly normal heart sounds.  Good peripheral circulation. Respiratory: Normal respiratory effort.  No retractions. Lungs CTAB. Gastrointestinal: Soft and nontender. No distention. No abdominal bruits. No CVA tenderness. Genitourinary:  Musculoskeletal: No lower extremity tenderness nor edema.  No joint effusions. Neurologic:  Normal speech and language. No gross focal neurologic deficits are appreciated. No gait instability. Skin:  Skin is warm, dry and intact. No rash noted. Psychiatric: Mood and affect are normal. Speech and behavior are normal.  ____________________________________________   LABS (all labs ordered are listed, but only abnormal results are  displayed)  Labs Reviewed  COMPREHENSIVE METABOLIC PANEL - Abnormal; Notable for the following components:      Result Value   Sodium 134 (*)    Potassium 5.6 (*)    BUN 28 (*)    Creatinine, Ser 1.06 (*)    GFR calc non Af Amer 49 (*)    GFR calc Af Amer 57 (*)    All other components within normal limits  CBC  BASIC METABOLIC PANEL   ____________________________________________  EKG  Not indicated. ____________________________________________  RADIOLOGY  ED MD interpretation:    Head CT negative for acute abnormality per radiology.  Official radiology report(s): CT Head Wo Contrast  Result Date: 10/13/2019 CLINICAL DATA:  Diplopia. EXAM: CT HEAD WITHOUT CONTRAST TECHNIQUE: Contiguous axial images were obtained from the base of the skull through the vertex without intravenous contrast. COMPARISON:  Head CT 11/13/2018 FINDINGS: Brain: No intracranial hemorrhage, mass effect, or midline shift. Stable degree of atrophy and chronic small vessel ischemia. Remote left frontal infarct. No hydrocephalus. The basilar cisterns are patent. No evidence of territorial infarct or acute ischemia. No extra-axial or intracranial fluid collection. Vascular: Atherosclerosis of skullbase vasculature without hyperdense vessel or abnormal calcification. Skull: No fracture or focal lesion. Sinuses/Orbits: Paranasal sinuses and mastoid air cells are clear. The visualized orbits are unremarkable. Other: None. IMPRESSION: 1. No acute intracranial abnormality. 2. Stable atrophy and chronic small vessel ischemia. Remote left frontal infarct. Electronically Signed   By: Keith Rake M.D.   On: 10/13/2019 17:29    ____________________________________________   PROCEDURES  Procedure(s) performed (including Critical Care):  Procedures  ____________________________________________   INITIAL IMPRESSION / ASSESSMENT AND PLAN     84 year old female presenting to the ER for 2 weeks of diplopia. See  HPI for further details. Protocol labs drawn. CT head normal. Plan to order MR Brain.  DIFFERENTIAL DIAGNOSIS  CVA,   ED COURSE  Normal head CT.  She does have a mild hyperkalemia at 5.6.  Plan will be to get an MRI of the brain and repeat the BMP.  Care transition to Dr. Corky Downs who will follow up on the results of the MRI report and the potassium level. ____________________________________________   FINAL CLINICAL IMPRESSION(S) / ED DIAGNOSES  Final diagnoses:  Diplopia     ED Discharge Orders    None       Tracie Fisher was evaluated in Emergency Department on 10/13/2019 for  the symptoms described in the history of present illness. She was evaluated in the context of the global COVID-19 pandemic, which necessitated consideration that the patient might be at risk for infection with the SARS-CoV-2 virus that causes COVID-19. Institutional protocols and algorithms that pertain to the evaluation of patients at risk for COVID-19 are in a state of rapid change based on information released by regulatory bodies including the CDC and federal and state organizations. These policies and algorithms were followed during the patient's care in the ED.   Note:  This document was prepared using Dragon voice recognition software and may include unintentional dictation errors.   Victorino Dike, FNP 10/13/19 2109    Lavonia Drafts, MD 10/13/19 2350

## 2019-10-16 DIAGNOSIS — R2681 Unsteadiness on feet: Secondary | ICD-10-CM | POA: Insufficient documentation

## 2020-02-28 ENCOUNTER — Other Ambulatory Visit: Payer: Self-pay | Admitting: Acute Care

## 2020-02-28 DIAGNOSIS — I639 Cerebral infarction, unspecified: Secondary | ICD-10-CM

## 2020-03-17 ENCOUNTER — Ambulatory Visit
Admission: RE | Admit: 2020-03-17 | Discharge: 2020-03-17 | Disposition: A | Discharge status: Home / Self Care | Payer: Medicare Other | Source: Ambulatory Visit | Attending: Acute Care | Admitting: Acute Care

## 2020-03-17 ENCOUNTER — Other Ambulatory Visit: Payer: Self-pay

## 2020-03-17 DIAGNOSIS — I639 Cerebral infarction, unspecified: Secondary | ICD-10-CM | POA: Diagnosis present

## 2020-05-12 ENCOUNTER — Other Ambulatory Visit: Payer: Self-pay | Admitting: Internal Medicine

## 2020-05-12 DIAGNOSIS — Z1231 Encounter for screening mammogram for malignant neoplasm of breast: Secondary | ICD-10-CM

## 2020-06-18 ENCOUNTER — Other Ambulatory Visit: Payer: Self-pay

## 2020-06-18 ENCOUNTER — Ambulatory Visit
Admission: RE | Admit: 2020-06-18 | Discharge: 2020-06-18 | Disposition: A | Discharge status: Home / Self Care | Payer: Medicare Other | Source: Ambulatory Visit | Attending: Internal Medicine | Admitting: Internal Medicine

## 2020-06-18 DIAGNOSIS — Z1231 Encounter for screening mammogram for malignant neoplasm of breast: Secondary | ICD-10-CM | POA: Insufficient documentation

## 2021-03-24 DIAGNOSIS — M159 Polyosteoarthritis, unspecified: Secondary | ICD-10-CM | POA: Insufficient documentation

## 2021-05-14 ENCOUNTER — Other Ambulatory Visit: Payer: Self-pay | Admitting: Internal Medicine

## 2021-05-14 DIAGNOSIS — Z1231 Encounter for screening mammogram for malignant neoplasm of breast: Secondary | ICD-10-CM

## 2021-05-20 DIAGNOSIS — I272 Pulmonary hypertension, unspecified: Secondary | ICD-10-CM | POA: Insufficient documentation

## 2021-06-04 IMAGING — US BILATERAL CAROTID DUPLEX ULTRASOUND
1 series · 13 of 24 positions shown · non-contrast
Comparison: None.

CLINICAL DATA: History of TIA. Left hand weakness. Syncopal
episode. History of hypertension.

EXAM:
BILATERAL CAROTID DUPLEX ULTRASOUND
TECHNIQUE: Gray scale imaging, color Doppler and duplex ultrasound were
performed of bilateral carotid and vertebral arteries in the neck.

[Series 1: bilateral carotid duplex ultrasound · 13 of 67 slices shown]
[im 1/67]
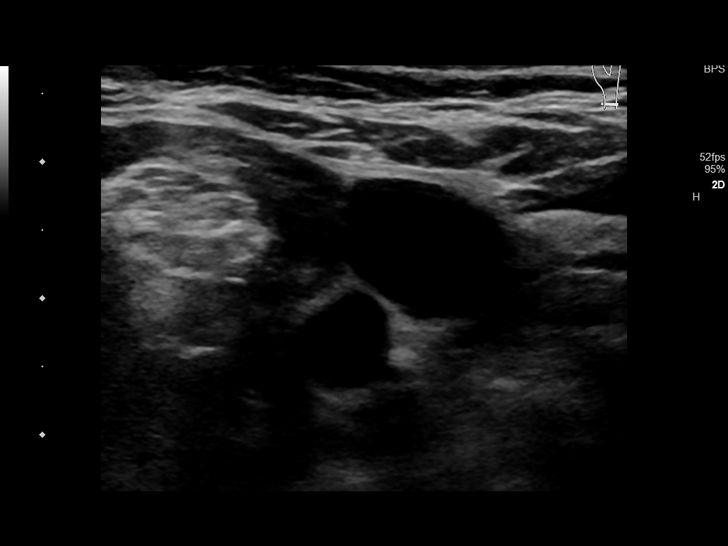
[im 6/67]
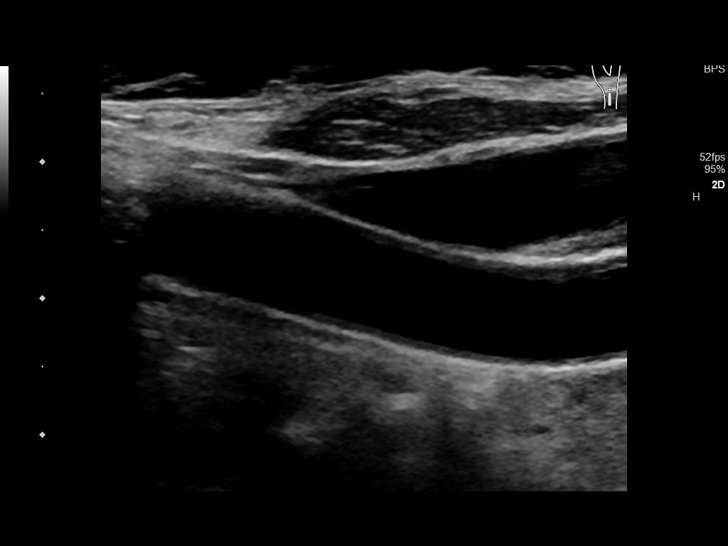
[im 12/67]
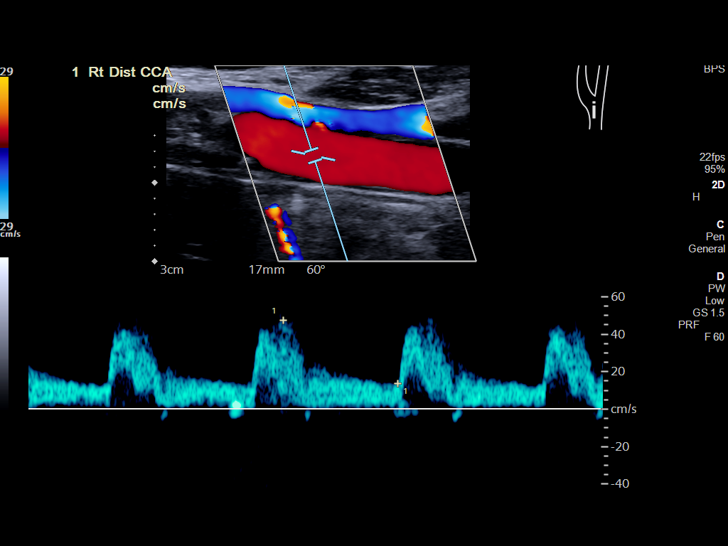
[im 18/67]
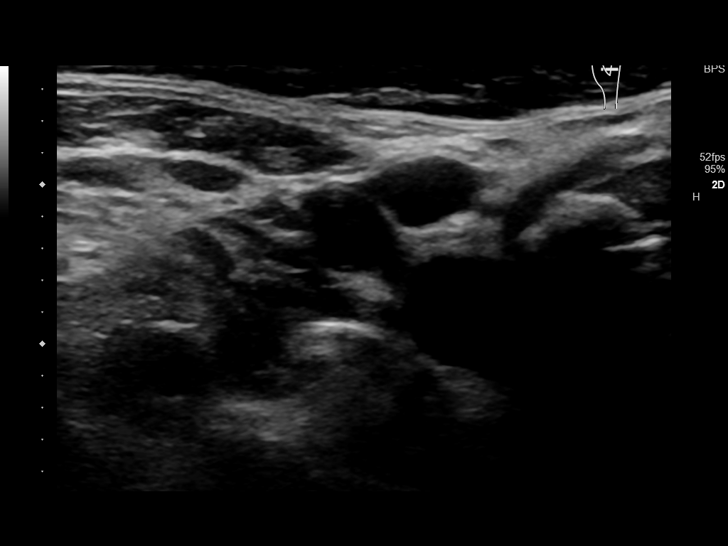
[im 23/67]
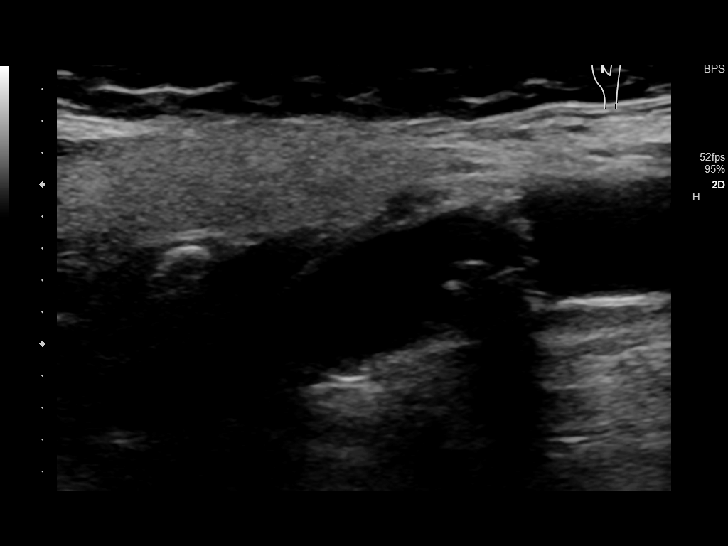
[im 29/67]
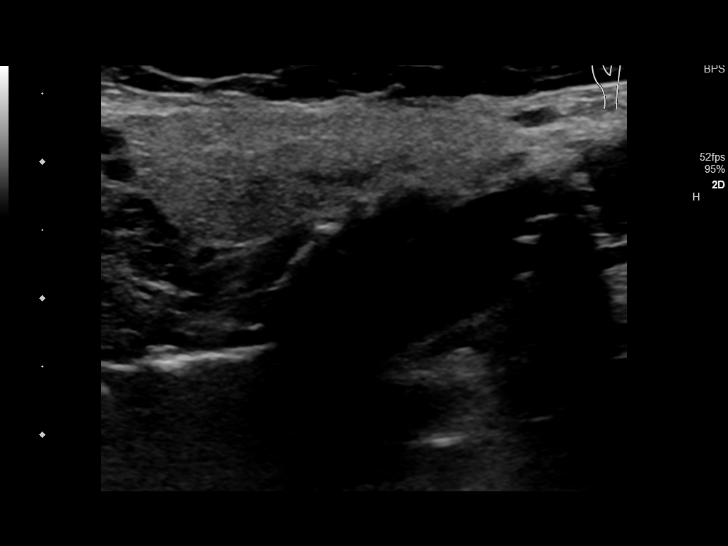
[im 35/67]
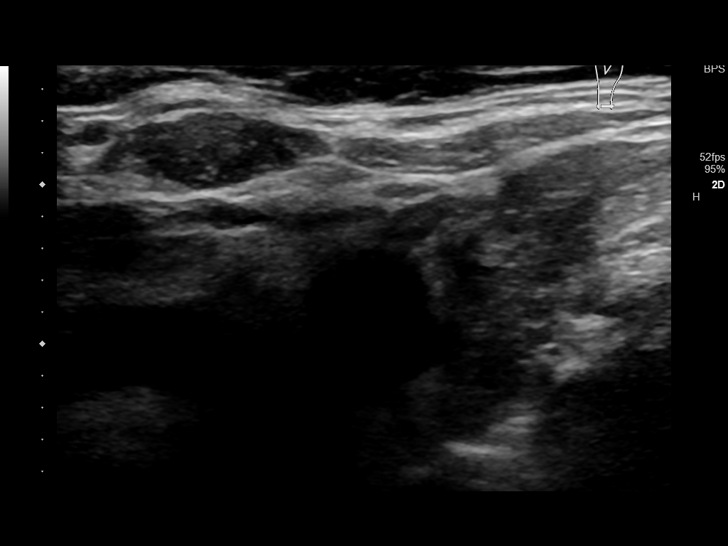
[im 38/67]
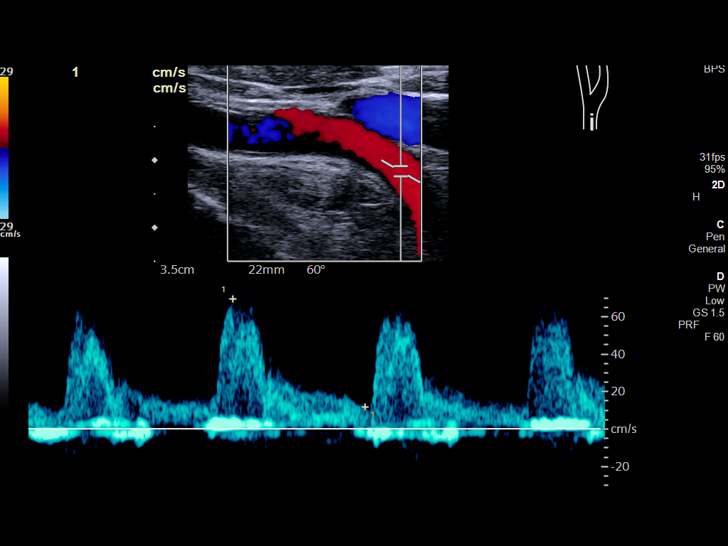
[im 44/67]
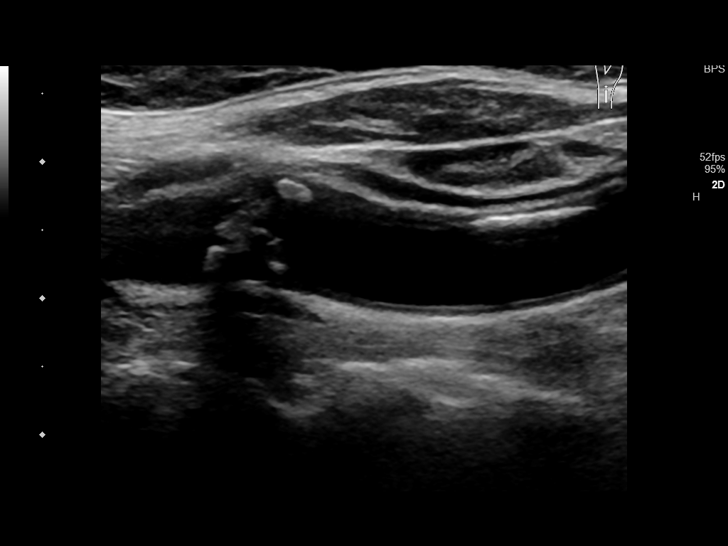
[im 49/67]
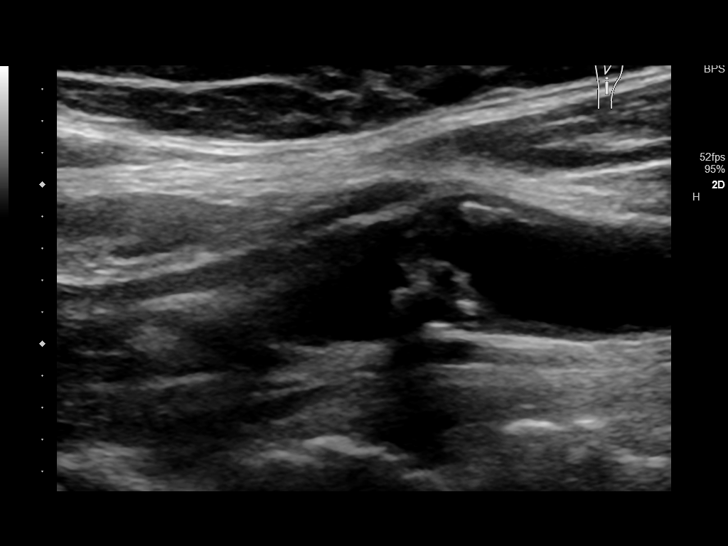
[im 55/67]
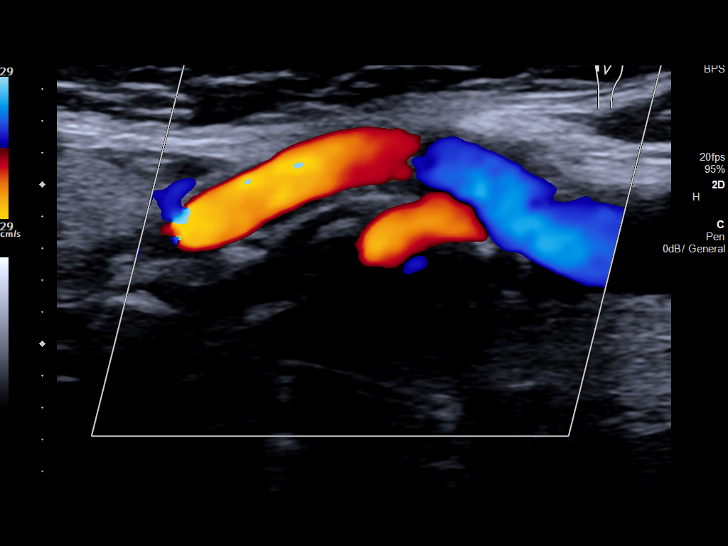
[im 61/67]
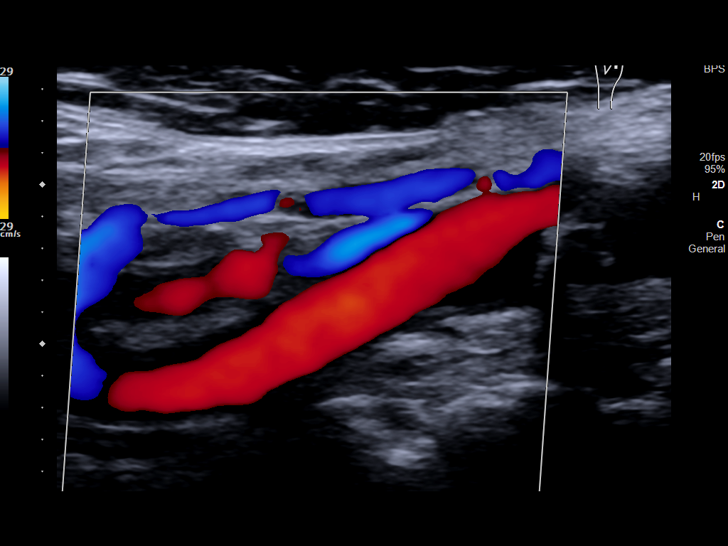
[im 67/67]
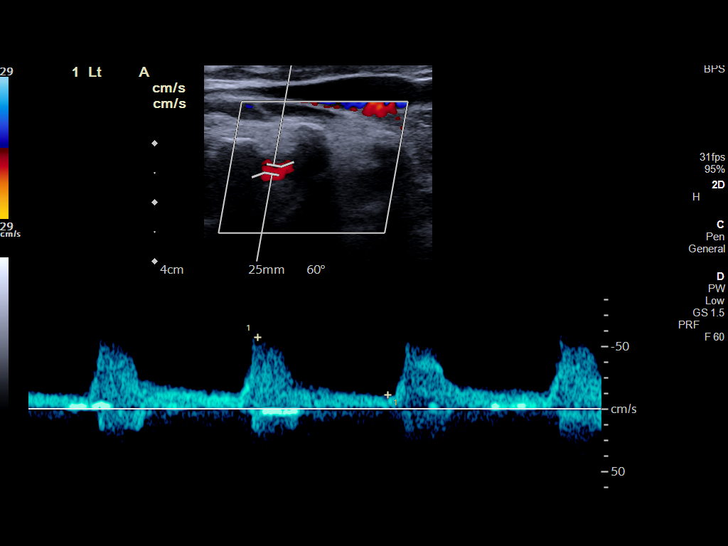

[13 of 24 positions shown; findings below may reference images not displayed]

FINDINGS: Criteria: Quantification of carotid stenosis is based on velocity
parameters that correlate the residual internal carotid diameter
with NASCET-based stenosis levels, using the diameter of the distal
internal carotid lumen as the denominator for stenosis measurement.

The following velocity measurements were obtained:

RIGHT

ICA: 57/11 cm/sec

CCA: 48/14 cm/sec

SYSTOLIC ICA/CCA RATIO:

ECA: 58 cm/sec

LEFT

ICA: 93/23 cm/sec

CCA: 54/8 cm/sec

SYSTOLIC ICA/CCA RATIO:

ECA: 74 cm/sec

RIGHT CAROTID ARTERY: There is a moderate to large amount of
eccentric echogenic plaque within the right carotid bulb (image 16),
extending to involve the origin and proximal aspects of the right
internal carotid artery (image 24), not resulting in elevated peak
systolic velocities within the interrogated course of the right
internal carotid artery to suggest a hemodynamically significant
stenosis.

RIGHT VERTEBRAL ARTERY:  Antegrade flow

LEFT CAROTID ARTERY: There is a minimal amount of eccentric
echogenic partially shadowing plaque within mid aspect the left
common carotid artery (image 41). There is a moderate to large
amount of eccentric mixed echogenic partially shadowing plaque
within the left carotid bulb (image 50), extending to involve the
origin and proximal aspects of the left internal carotid artery
(image 58), not resulting in elevated peak systolic velocities
within the interrogated course the left internal carotid artery to
suggest a hemodynamically significant stenosis.

LEFT VERTEBRAL ARTERY:  Antegrade Flow
IMPRESSION: Moderate to large amount of bilateral atherosclerotic plaque, left
subjectively greater than right, not resulting in a hemodynamically
significant stenosis within either internal artery.

## 2021-06-22 ENCOUNTER — Ambulatory Visit
Admission: RE | Admit: 2021-06-22 | Discharge: 2021-06-22 | Disposition: A | Discharge status: Home / Self Care | Payer: Medicare Other | Source: Ambulatory Visit | Attending: Internal Medicine | Admitting: Internal Medicine

## 2021-06-22 ENCOUNTER — Other Ambulatory Visit: Payer: Self-pay

## 2021-06-22 DIAGNOSIS — Z1231 Encounter for screening mammogram for malignant neoplasm of breast: Secondary | ICD-10-CM | POA: Insufficient documentation

## 2021-07-26 DIAGNOSIS — R413 Other amnesia: Secondary | ICD-10-CM | POA: Insufficient documentation

## 2021-09-02 ENCOUNTER — Ambulatory Visit: Payer: Medicare Other | Attending: Neurology

## 2021-09-02 VITALS — BP 136/69 | HR 59 | Ht 61.0 in | Wt 159.0 lb

## 2021-09-02 DIAGNOSIS — R269 Unspecified abnormalities of gait and mobility: Secondary | ICD-10-CM | POA: Diagnosis present

## 2021-09-02 DIAGNOSIS — R2681 Unsteadiness on feet: Secondary | ICD-10-CM | POA: Diagnosis present

## 2021-09-02 DIAGNOSIS — R278 Other lack of coordination: Secondary | ICD-10-CM | POA: Diagnosis present

## 2021-09-02 DIAGNOSIS — R262 Difficulty in walking, not elsewhere classified: Secondary | ICD-10-CM | POA: Insufficient documentation

## 2021-09-02 DIAGNOSIS — M6281 Muscle weakness (generalized): Secondary | ICD-10-CM | POA: Diagnosis present

## 2021-09-02 NOTE — Therapy (Addendum)
[facility] ?[place], [place], [address] ?Phone: [phone]   Fax:  [phone] ? ?Physical Therapy Evaluation ? ?Patient Details  ?Name: Tracie Fisher ?MRN: [id number] ?Date of Birth: 1935/08/04 ?Referring Provider (PT): Gurney Maxin ? ? ?Encounter Date: 09/02/2021 ? ? PT End of Session - 09/03/21 6384   ? ? Visit Number 1   ? Number of Visits 25   ? Date for PT Re-Evaluation 11/25/21   ? Authorization Time Period 09/02/2021- 11/25/2021   ? Progress Note Due on Visit 10   ? PT Start Time 1530   ? PT Stop Time 1630   ? PT Time Calculation (min) 60 min   ? Equipment Utilized During Treatment Gait belt   ? Activity Tolerance Patient tolerated treatment well   ? Behavior During Therapy [facility] for tasks assessed/performed   ? ?  ?  ? ?  ? ? ?Past Medical History:  ?Diagnosis Date  ? Cancer [facility])   ? SKIN  ? Complication of anesthesia   ? Depression   ? Dysrhythmia   ? GERD (gastroesophageal reflux disease)   ? Hiatal hernia   ? HOH (hard of hearing)   ? Hypertension   ? Hypothyroidism   ? PONV (postoperative nausea and vomiting)   ? Thyroid disease   ? ? ?Past Surgical History:  ?Procedure Laterality Date  ? APPENDECTOMY    ? BACK SURGERY    ? MULTIPLE/ROD IN BACK  ? BREAST CYST ASPIRATION Left "years ago"  ? CATARACT EXTRACTION W/PHACO Left 03/21/2017  ? Procedure: CATARACT EXTRACTION PHACO AND INTRAOCULAR LENS PLACEMENT (IOC);  Surgeon: Birder Robson, MD;  Location: [facility] ORS;  Service: Ophthalmology;  Laterality: Left;  [place] 00:48 ?AP% 17.8 ?CDE 8.57 ?Fluid pack lot # [id number]  ? CATARACT EXTRACTION W/PHACO Right 04/11/2017  ? Procedure: CATARACT EXTRACTION PHACO AND INTRAOCULAR LENS PLACEMENT (IOC);  Surgeon: Birder Robson, MD;  Location: [facility] ORS;  Service: Ophthalmology;  Laterality: Right;  [place] 01:09.5 ?AP% 15.8 ?CDE 11.04 ?Fluid Pack lot # [id number]  ? CHOLECYSTECTOMY    ? COLON SURGERY    ? BOWEL OBSTRUCTION  ? COLONOSCOPY WITH PROPOFOL N/A  04/25/2016  ? Procedure: COLONOSCOPY WITH PROPOFOL;  Surgeon: Jonathon Bellows, MD;  Location: [facility] ENDOSCOPY;  Service: Endoscopy;  Laterality: N/A;  ? ESOPHAGOGASTRODUODENOSCOPY (EGD) WITH PROPOFOL N/A 04/25/2016  ? Procedure: ESOPHAGOGASTRODUODENOSCOPY (EGD) WITH PROPOFOL;  Surgeon: Jonathon Bellows, MD;  Location: [facility] ENDOSCOPY;  Service: Endoscopy;  Laterality: N/A;  ? EXPLORATORY LAPAROTOMY  10/18/2011  ? blockage small intestines  ? GIVENS CAPSULE STUDY N/A 05/11/2016  ? Procedure: GIVENS CAPSULE STUDY;  Surgeon: Jonathon Bellows, MD;  Location: [facility] ENDOSCOPY;  Service: Endoscopy;  Laterality: N/A;  ? ? ?Vitals:  ? 09/02/21 1549  ?BP: 136/69  ?Pulse: (!) 59  ?Weight: 159 lb (72.1 kg)  ?Height: '5\' 1"'$  (1.549 m)  ? ? ? ? ?: ? ? ?OBJECTIVE ? ?Musculoskeletal ?Tremor: Absent ?Bulk: Normal ?Tone: Normal, no clonus ? ? ?Posture ?Forward head/rounded shoulders ? ? ?Gait ?Short reciprocal steps with mild unsteadiness without an AD ? ?Strength ?R/L ?4/4 Hip flexion ?4/4 Hip external rotation ?4/4 Hip internal rotation ?4/4 Hip extension  ?4/4 Hip abduction ?4/4  Hip adduction ?4/4 Knee extension ?4/4 Knee flexion ?4/4 Ankle Plantarflexion ?4/4 Ankle Dorsiflexion ? ? ?NEUROLOGICAL: ? ?Mental Status ?Patient is oriented to person, place and time- slight delay in response- very soft spoken.  ?Recent memory is intact.  ?Attention span and concentration are  intact.  ?Expressive speech is intact.  ?Patient's fund of knowledge is within normal limits for educational level. ? ? ? ? ?Sensation ?Grossly intact to light touch bilateral UEs/LEs as determined by testing dermatomes C2-T2/L2-S2 respectively. Proprioception and hot/cold testing deferred on this date ? ? ? ? ?Coordination/Cerebellar ?Finger to Nose: WNL ?Heel to Shin: WNL ?Rapid alternating movements: WNL ? ? ?FUNCTIONAL OUTCOME MEASURES ? ? Results Comments  ?BERG 42/56 Fall risk, in need of intervention  ?FOTO 50   ?    ?TUG 17.9 seconds   ?5TSTS  22.63 seconds Without UE support  ?     ?10 Meter Gait Speed Self-selected: 0.56 m/s;  Below normative values for full community ambulation  ?    ?    ? ? ? ? ?ASSESSMENT ?Clinical Impression: Pt is a pleasant 86 year-old female referred for difficulty with balance. PT examination reveals deficits . Pt presents with deficits including B LE muscle weakness, impaired gait with decreased gait speed and impaired balance (42/56)  with increased risk of falling. Patient will benefit from skilled PT services to address these deficits, improve balance, and decrease risk for future falls.  ? ? ? ?.   ?  ? ?   ? ? ? ? ? ?  ? ?.  ? ? [facility] PT Assessment - 09/02/21 1554   ? ?  ? Assessment  ? Medical Diagnosis Polyneuropathy/unsteadiness of feet   ? Referring Provider (PT) Gurney Maxin   ? Onset Date/Surgical Date 07/14/21   ? Hand Dominance Right   ? Next MD Visit Yes- Dr. Melrose Nakayama within 2 weeks - unsure of dat   ? Prior Therapy Home health PT   ?  ? Precautions  ? Precautions Fall   ?  ? Restrictions  ? Weight Bearing Restrictions No   ?  ? Balance Screen  ? Has the patient fallen in the past 6 months No   ? Has the patient had a decrease in activity level because of a fear of falling?  Yes   ? Is the patient reluctant to leave their home because of a fear of falling?  Yes   ?  ? Home Environment  ? Living Environment Private residence   ? Living Arrangements Alone   ? Type of Home Apartment   ? Home Access Level entry   ? Home Layout One level   ? [place] - 2 wheels   ?  ? Prior Function  ? Level of Independence Independent   ? Vocation Retired   ?  ? Cognition  ? Overall Cognitive Status Within Functional Limits for tasks assessed   ? Attention Focused   ? Focused Attention Appears intact   ? Memory Appears intact   ? Awareness Appears intact   ? Problem Solving Appears intact   ? Executive Function Reasoning   ?  ? Standardized Balance Assessment  ? Standardized Balance Assessment Berg Balance Test   ?  ? Berg Balance Test  ? Sit to Stand Able  to stand without using hands and stabilize independently   ? Standing Unsupported Able to stand safely 2 minutes   ? Sitting with Back Unsupported but Feet Supported on Floor or Stool Able to sit safely and securely 2 minutes   ? Stand to Sit Controls descent by using hands   ? Transfers Able to transfer safely, minor use of hands   ? Standing Unsupported with Eyes Closed Able to stand 10 seconds with supervision   ?  Standing Unsupported with Feet Together Able to place feet together independently and stand for 1 minute with supervision   ? From Standing, Reach Forward with Outstretched Arm Can reach forward >12 cm safely (5")   ? From Standing Position, Pick up Object from Floor Able to pick up shoe, needs supervision   ? From Standing Position, Turn to Look Behind Over each Shoulder Turn sideways only but maintains balance   ? Turn 360 Degrees Able to turn 360 degrees safely but slowly   ? Standing Unsupported, Alternately Place Feet on Step/Stool Able to stand independently and safely and complete 8 steps in 20 seconds   ? Standing Unsupported, One Foot in Front Able to take small step independently and hold 30 seconds   ? Standing on One Leg Tries to lift leg/unable to hold 3 seconds but remains standing independently   ? Total Score 42   ? ?  ?  ? ?  ? ? ? ? ? ? ? ? ? ? ? ? ? ?Objective measurements completed on examination: See above findings.  ? ? ? ? ? ? ? ? ? ? ? ? ? ? PT Education - 09/03/21 0922   ? ? Education Details PT plan of care and purpose of functional outcome testing   ? Person(s) Educated Patient   ? Methods Explanation;Demonstration;Tactile cues   ? Comprehension Verbalized understanding;Returned demonstration;Verbal cues required;Tactile cues required;Need further instruction   ? ?  ?  ? ?  ? ? ? PT Short Term Goals - 09/03/21 1151   ? ?  ? PT SHORT TERM GOAL #1  ? Title Pt will be independent with initial HEP in order to improve strength and balance in order to decrease fall risk and improve  function at home and work.   ? Baseline 09/02/2021= No formal HEP in place   ? Time 6   ? Period Weeks   ? Status New   ? Target Date 10/14/21   ? ?  ?  ? ?  ? ? ? ? PT Long Term Goals - 09/03/21 1152

## 2021-09-03 NOTE — Addendum Note (Signed)
Addended by: Lewis Moccasin on: 09/03/2021 12:07 PM ? ? Modules accepted: Orders ? ?

## 2021-09-06 ENCOUNTER — Encounter: Payer: Self-pay | Admitting: Physical Therapy

## 2021-09-06 ENCOUNTER — Ambulatory Visit: Payer: Medicare Other | Admitting: Physical Therapy

## 2021-09-06 DIAGNOSIS — R2681 Unsteadiness on feet: Secondary | ICD-10-CM

## 2021-09-06 DIAGNOSIS — M6281 Muscle weakness (generalized): Secondary | ICD-10-CM

## 2021-09-06 DIAGNOSIS — R269 Unspecified abnormalities of gait and mobility: Secondary | ICD-10-CM

## 2021-09-06 DIAGNOSIS — R262 Difficulty in walking, not elsewhere classified: Secondary | ICD-10-CM

## 2021-09-06 NOTE — Patient Instructions (Signed)
Access Code: [email or web address] ?URL: [email or web address] ?Date: 09/06/2021 ?Prepared by: Rivka Barbara ? ?Exercises ?- Sit to Stand Without Arm Support  - 1 x daily - 7 x weekly - 2 sets - 10 reps ?- Standing Heel Raise with Support  - 1 x daily - 7 x weekly - 3 sets - 10 reps ?- Standing March with Counter Support  - 1 x daily - 7 x weekly - 3 sets - 10 reps ?- Standing Tandem Balance with Counter Support  - 1 x daily - 7 x weekly - 10 reps - 30 seconds hold ?

## 2021-09-06 NOTE — Therapy (Signed)
[place] [facility] ?[place], [place], [address] ?Phone: [phone]   Fax:  [phone] ? ?Physical Therapy Treatment ? ?Patient Details  ?Name: Tracie Fisher ?MRN: [id number] ?Date of Birth: 02-15-1936 ?Referring Provider (PT): Gurney Maxin ? ? ?Encounter Date: 09/06/2021 ? ? PT End of Session - 09/06/21 1439   ? ? Visit Number 2   ? Number of Visits 25   ? Date for PT Re-Evaluation 11/25/21   ? Authorization Time Period 09/02/2021- 11/25/2021   ? Progress Note Due on Visit 10   ? PT Start Time 2992   ? PT Stop Time 1515   ? PT Time Calculation (min) 42 min   ? Equipment Utilized During Treatment Gait belt   ? Activity Tolerance Patient tolerated treatment well   ? Behavior During Therapy [facility] for tasks assessed/performed   ? ?  ?  ? ?  ? ? ?Past Medical History:  ?Diagnosis Date  ? Cancer [facility])   ? SKIN  ? Complication of anesthesia   ? Depression   ? Dysrhythmia   ? GERD (gastroesophageal reflux disease)   ? Hiatal hernia   ? HOH (hard of hearing)   ? Hypertension   ? Hypothyroidism   ? PONV (postoperative nausea and vomiting)   ? Thyroid disease   ? ? ?Past Surgical History:  ?Procedure Laterality Date  ? APPENDECTOMY    ? BACK SURGERY    ? MULTIPLE/ROD IN BACK  ? BREAST CYST ASPIRATION Left "years ago"  ? CATARACT EXTRACTION W/PHACO Left 03/21/2017  ? Procedure: CATARACT EXTRACTION PHACO AND INTRAOCULAR LENS PLACEMENT (IOC);  Surgeon: Birder Robson, MD;  Location: [facility] ORS;  Service: Ophthalmology;  Laterality: Left;  [place] 00:48 ?AP% 17.8 ?CDE 8.57 ?Fluid pack lot # [id number]  ? CATARACT EXTRACTION W/PHACO Right 04/11/2017  ? Procedure: CATARACT EXTRACTION PHACO AND INTRAOCULAR LENS PLACEMENT (IOC);  Surgeon: Birder Robson, MD;  Location: [facility] ORS;  Service: Ophthalmology;  Laterality: Right;  [place] 01:09.5 ?AP% 15.8 ?CDE 11.04 ?Fluid Pack lot # [id number]  ? CHOLECYSTECTOMY    ? COLON SURGERY    ? BOWEL OBSTRUCTION  ? COLONOSCOPY WITH PROPOFOL N/A  04/25/2016  ? Procedure: COLONOSCOPY WITH PROPOFOL;  Surgeon: Jonathon Bellows, MD;  Location: [facility] ENDOSCOPY;  Service: Endoscopy;  Laterality: N/A;  ? ESOPHAGOGASTRODUODENOSCOPY (EGD) WITH PROPOFOL N/A 04/25/2016  ? Procedure: ESOPHAGOGASTRODUODENOSCOPY (EGD) WITH PROPOFOL;  Surgeon: Jonathon Bellows, MD;  Location: [facility] ENDOSCOPY;  Service: Endoscopy;  Laterality: N/A;  ? EXPLORATORY LAPAROTOMY  10/18/2011  ? blockage small intestines  ? GIVENS CAPSULE STUDY N/A 05/11/2016  ? Procedure: GIVENS CAPSULE STUDY;  Surgeon: Jonathon Bellows, MD;  Location: [facility] ENDOSCOPY;  Service: Endoscopy;  Laterality: N/A;  ? ? ?There were no vitals filed for this visit. ? ? Subjective Assessment - 09/06/21 1436   ? ? Subjective Pt reports she had some difficulty getting from gift shop from front door today. Pt reports she has occassional LOB where she has a real bad day being off balance. Pt complains of some pain in her hand that is chronic in nature.   ? Pertinent History Patient presents with referral for polyneuropathy/unsteadiness on feet. She endorses past medical history significant for HTN, skin cancer, Neuropathy with bilateral foot pain, Chronic back pain, OA.   ? Limitations Lifting;Standing;Walking;House hold activities   ? How long can you sit comfortably? No difficulty   ? How long can you stand comfortably? <30 min   ? How long can  you walk comfortably? <5 min   ? Patient Stated Goals I just want to walk better.   ? Currently in Pain? No/denies   ? ?  ?  ? ?  ? ? ?Exercise/Activity Sets/ Reps/Time/ Resistance Assistance Charge type Comments  ?Airex balance beam sidestepping X5 laps CGA Neuro Re-Ed 1 loss of balance corrected with upper extremity support via balance bar anteriorly  ?      ?      ?Tandem stance 2 x 45 sec CGA Neuro re-ed Cues for utilizing upper extremity transition into this position  ?      ?Airex static stance normal,  ?Eyes closed 3 x 45 sec ea CGA Neuro re-ed Increased sway with eyes closed.  1 instance of patient  utilize upper extremity for recovery of balance.  ?      ?      ?      ?      ?      ?Treatment provided this session ? ? ?Pt educated throughout session about proper posture and technique with exercises. Improved exercise technique, movement at target joints, use of target muscles after min to mod verbal, visual, tactile cues. ?Note: Portions of this document were prepared using Dragon voice recognition software and although reviewed may contain unintentional dictation errors in syntax, grammar, or spelling. ? ? ?Access Code: [email or web address] ?URL: [email or web address] ?Date: 09/06/2021 ?Prepared by: Rivka Barbara ? ?Exercises ?- Sit to Stand Without Arm Support  - 1 x daily - 7 x weekly - 2 sets - 10 reps ?- Standing Heel Raise with Support  - 1 x daily - 7 x weekly - 3 sets - 10 reps ?- Standing March with Counter Support  - 1 x daily - 7 x weekly - 3 sets - 10 reps ?- Standing Tandem Balance with Counter Support  - 1 x daily - 7 x weekly - 10 reps - 30 seconds hold ? ? ? ? ? ? ? ? ? ? ? ? ? ? ? ? ? ? ? ? ? ? ? ? PT Education - 09/06/21 1518   ? ? Education Details HEP (look in ptient instructions section.   ? Person(s) Educated Patient   ? Methods Explanation;Demonstration;Tactile cues   ? Comprehension Verbalized understanding   ? ?  ?  ? ?  ? ? ? PT Short Term Goals - 09/03/21 1151   ? ?  ? PT SHORT TERM GOAL #1  ? Title Pt will be independent with initial HEP in order to improve strength and balance in order to decrease fall risk and improve function at home and work.   ? Baseline 09/02/2021= No formal HEP in place   ? Time 6   ? Period Weeks   ? Status New   ? Target Date 10/14/21   ? ?  ?  ? ?  ? ? ? ? PT Long Term Goals - 09/03/21 1152   ? ?  ? PT LONG TERM GOAL #1  ? Title Pt will be independent with final HEP in order to improve strength and balance in order to decrease fall risk and improve function at home and work.   ? Baseline 09/02/2021= No formal HEP in place   ? Time 12   ? Period Weeks    ? Status New   ? Target Date 11/25/21   ?  ? PT LONG TERM GOAL #2  ? Title Pt will improve FOTO to target score of 53 to  display perceived improvements in ability to complete ADL's   ? Baseline 09/02/2021= 50   ? Time 12   ? Period Weeks   ? Status New   ? Target Date 11/25/21   ?  ? PT LONG TERM GOAL #3  ? Title Pt will improve BERG by at least 5 points in order to demonstrate clinically significant improvement in balance.   ? Baseline 09/02/2021= 42/56   ? Time 12   ? Period Weeks   ? Status New   ? Target Date 11/25/21   ?  ? PT LONG TERM GOAL #4  ? Title Pt will decrease 5TSTS by at least 6 seconds in order to demonstrate clinically significant improvement in LE strength.   ? Baseline 09/02/2021= 22.63 sec without UE support   ? Time 12   ? Period Weeks   ? Status New   ? Target Date 11/25/21   ?  ? PT LONG TERM GOAL #5  ? Title Pt will decrease TUG to below 14 seconds/decrease in order to demonstrate decreased fall risk.   ? Baseline 09/02/2021= 17.9 sec without an AD   ? Time 12   ? Period Weeks   ? Status New   ? Target Date 11/25/21   ?  ? Additional Long Term Goals  ? Additional Long Term Goals Yes   ?  ? PT LONG TERM GOAL #6  ? Title Pt will increase 10MWT by at least 0.2 m/s in order to demonstrate clinically significant improvement in community ambulation   ? Baseline 09/02/2021=0.56 m/s without an AD   ? Time 12   ? Period Weeks   ? Status New   ? Target Date 11/25/21   ? ?  ?  ? ?  ? ? ? ? ? ? ? ? Plan - 09/06/21 1440   ? ? Clinical Impression Statement Patient is pleasant and demonstrates excellent motivation for completion of physical therapy exercises.  Patient introduced to initial home exercise program focused on lower extremity strengthening as well as dynamic and static balance exercises.  Patient instructed to complete on all day she is not completing physical therapy sessions.  We will continue to progress lower extremity strength and endurance in future sessions as well as integration of dynamic  balance exercises.  Patient will continue to benefit from skilled physical therapy services in order to improve her mobility, endurance, balance, reduce her risk of falls, and improve her overall quali

## 2021-09-09 ENCOUNTER — Ambulatory Visit: Payer: Medicare Other

## 2021-09-09 DIAGNOSIS — R269 Unspecified abnormalities of gait and mobility: Secondary | ICD-10-CM

## 2021-09-09 DIAGNOSIS — R262 Difficulty in walking, not elsewhere classified: Secondary | ICD-10-CM

## 2021-09-09 DIAGNOSIS — M6281 Muscle weakness (generalized): Secondary | ICD-10-CM

## 2021-09-09 DIAGNOSIS — R2681 Unsteadiness on feet: Secondary | ICD-10-CM | POA: Diagnosis not present

## 2021-09-09 DIAGNOSIS — R278 Other lack of coordination: Secondary | ICD-10-CM

## 2021-09-09 NOTE — Therapy (Signed)
[place] [facility] ?[place], [place], [address] ?Phone: [phone]   Fax:  [phone] ? ?Physical Therapy Treatment ? ?Patient Details  ?Name: Tracie Fisher ?MRN: [id number] ?Date of Birth: 08-29-1935 ?Referring Provider (PT): Gurney Maxin ? ? ?Encounter Date: 09/09/2021 ? ? PT End of Session - 09/09/21 1244   ? ? Visit Number 3   ? Number of Visits 25   ? Date for PT Re-Evaluation 11/25/21   ? Authorization Time Period 09/02/2021- 11/25/2021   ? Progress Note Due on Visit 10   ? PT Start Time 1146   ? PT Stop Time 1230   ? PT Time Calculation (min) 44 min   ? Equipment Utilized During Treatment Gait belt   ? Activity Tolerance Patient tolerated treatment well   ? Behavior During Therapy [facility] for tasks assessed/performed   ? ?  ?  ? ?  ? ? ?Past Medical History:  ?Diagnosis Date  ? Cancer [facility])   ? SKIN  ? Complication of anesthesia   ? Depression   ? Dysrhythmia   ? GERD (gastroesophageal reflux disease)   ? Hiatal hernia   ? HOH (hard of hearing)   ? Hypertension   ? Hypothyroidism   ? PONV (postoperative nausea and vomiting)   ? Thyroid disease   ? ? ?Past Surgical History:  ?Procedure Laterality Date  ? APPENDECTOMY    ? BACK SURGERY    ? MULTIPLE/ROD IN BACK  ? BREAST CYST ASPIRATION Left "years ago"  ? CATARACT EXTRACTION W/PHACO Left 03/21/2017  ? Procedure: CATARACT EXTRACTION PHACO AND INTRAOCULAR LENS PLACEMENT (IOC);  Surgeon: Birder Robson, MD;  Location: [facility] ORS;  Service: Ophthalmology;  Laterality: Left;  [place] 00:48 ?AP% 17.8 ?CDE 8.57 ?Fluid pack lot # [id number]  ? CATARACT EXTRACTION W/PHACO Right 04/11/2017  ? Procedure: CATARACT EXTRACTION PHACO AND INTRAOCULAR LENS PLACEMENT (IOC);  Surgeon: Birder Robson, MD;  Location: [facility] ORS;  Service: Ophthalmology;  Laterality: Right;  [place] 01:09.5 ?AP% 15.8 ?CDE 11.04 ?Fluid Pack lot # [id number]  ? CHOLECYSTECTOMY    ? COLON SURGERY    ? BOWEL OBSTRUCTION  ? COLONOSCOPY WITH PROPOFOL N/A  04/25/2016  ? Procedure: COLONOSCOPY WITH PROPOFOL;  Surgeon: Jonathon Bellows, MD;  Location[facility] ENDOSCOPY;  Service: Endoscopy;  Laterality: N/A;  ? ESOPHAGOGASTRODUODENOSCOPY (EGD) WITH PROPOFOL N/A 04/25/2016  ? Procedure: ESOPHAGOGASTRODUODENOSCOPY (EGD) WITH PROPOFOL;  Surgeon: Jonathon Bellows, MD;  Location: [facility] ENDOSCOPY;  Service: Endoscopy;  Laterality: N/A;  ? EXPLORATORY LAPAROTOMY  10/18/2011  ? blockage small intestines  ? GIVENS CAPSULE STUDY N/A 05/11/2016  ? Procedure: GIVENS CAPSULE STUDY;  Surgeon: Jonathon Bellows, MD;  Location: [facility] ENDOSCOPY;  Service: Endoscopy;  Laterality: N/A;  ? ? ?There were no vitals filed for this visit. ? ? Subjective Assessment - 09/09/21 1149   ? ? Subjective Pt denies any pain in her feet. Denies falls or stumbles. Pt reports going to [facility] for hand pain later in the next month or two.   ? Pertinent History Patient presents with referral for polyneuropathy/unsteadiness on feet. She endorses past medical history significant for HTN, skin cancer, Neuropathy with bilateral foot pain, Chronic back pain, OA.   ? Limitations Lifting;Standing;Walking;House hold activities   ? How long can you sit comfortably? No difficulty   ? How long can you stand comfortably? <30 min   ? How long can you walk comfortably? <5 min   ? Patient Stated Goals I just want to walk better.   ?  Currently in Pain? No/denies   ? ?  ?  ? ?  ? ? ? ? ?Neuro Re-Ed  ?  ? Reassess HEP  ? ?Performing as described below ?  ?Access Code: [email or web address] ?URL: [email or web address] ?Date: 09/06/2021 ?Prepared by: Rivka Barbara ?  ?Exercises ?- Sit to Stand Without Arm Support  - 1 x daily - 7 x weekly - 2 sets - 10 reps ?- Standing Heel Raise with Support  - 1 x daily - 7 x weekly - 3 sets - 10 reps ?- Standing March with Counter Support  - 1 x daily - 7 x weekly - 3 sets - 10 reps ?- Standing Tandem Balance with Counter Support  - 1 x daily - 7 x weekly - 10 reps - 30 seconds hold ?  ? Pt demonstrating  excellent understanding and performance of exercises. Pt requiring VC's for time, reps, sets and frequency for at home performance. Use of chair behind pt for safety if performing alone.  ? ?  ?Forwards/backwards hurdle step overs: x6 laps in // bars with CGA  ?R/L lateral hurdle step overs: x6 laps in // bars with CGA  ? ? *pt requiring frequent VC's to reduce UE support on // bars for balance ? ? ?200' gait with noted decreased step lengths bilaterally along with decreased hip extension. Slow and cautious gait noted with slowed speed with turning corners.  ? ? PT Education - 09/09/21 1244   ? ? Education Details HEP reps, sets, frequency   ? Person(s) Educated Patient   ? Methods Explanation;Demonstration   ? Comprehension Verbalized understanding;Returned demonstration   ? ?  ?  ? ?  ? ? ? PT Short Term Goals - 09/03/21 1151   ? ?  ? PT SHORT TERM GOAL #1  ? Title Pt will be independent with initial HEP in order to improve strength and balance in order to decrease fall risk and improve function at home and work.   ? Baseline 09/02/2021= No formal HEP in place   ? Time 6   ? Period Weeks   ? Status New   ? Target Date 10/14/21   ? ?  ?  ? ?  ? ? ? ? PT Long Term Goals - 09/03/21 1152   ? ?  ? PT LONG TERM GOAL #1  ? Title Pt will be independent with final HEP in order to improve strength and balance in order to decrease fall risk and improve function at home and work.   ? Baseline 09/02/2021= No formal HEP in place   ? Time 12   ? Period Weeks   ? Status New   ? Target Date 11/25/21   ?  ? PT LONG TERM GOAL #2  ? Title Pt will improve FOTO to target score of 53 to display perceived improvements in ability to complete ADL's   ? Baseline 09/02/2021= 50   ? Time 12   ? Period Weeks   ? Status New   ? Target Date 11/25/21   ?  ? PT LONG TERM GOAL #3  ? Title Pt will improve BERG by at least 5 points in order to demonstrate clinically significant improvement in balance.   ? Baseline 09/02/2021= 42/56   ? Time 12   ? Period  Weeks   ? Status New   ? Target Date 11/25/21   ?  ? PT LONG TERM GOAL #4  ? Title Pt will decrease 5TSTS by at least 6 seconds  in order to demonstrate clinically significant improvement in LE strength.   ? Baseline 09/02/2021= 22.63 sec without UE support   ? Time 12   ? Period Weeks   ? Status New   ? Target Date 11/25/21   ?  ? PT LONG TERM GOAL #5  ? Title Pt will decrease TUG to below 14 seconds/decrease in order to demonstrate decreased fall risk.   ? Baseline 09/02/2021= 17.9 sec without an AD   ? Time 12   ? Period Weeks   ? Status New   ? Target Date 11/25/21   ?  ? Additional Long Term Goals  ? Additional Long Term Goals Yes   ?  ? PT LONG TERM GOAL #6  ? Title Pt will increase 10MWT by at least 0.2 m/s in order to demonstrate clinically significant improvement in community ambulation   ? Baseline 09/02/2021=0.56 m/s without an AD   ? Time 12   ? Period Weeks   ? Status New   ? Target Date 11/25/21   ? ?  ?  ? ?  ? ? ? ? ? ? ? ? Plan - 09/09/21 1245   ? ? Clinical Impression Statement Pt demonstrating great understanding of HEP. Brief review and education required on reps, sets, and frequency. Focus of session on dynamic gait and balance with pt requiring frequent need for SUE support on // bars for stability. Pt will continue to benefit from skiled PT services to reduce risk of falls.   ? Personal Factors and Comorbidities Comorbidity 2   ? Comorbidities HTN, OA   ? Examination-Activity Limitations Bend;Caring for Others;Carry;Lift;Squat;Stairs;Transfers;Stand   ? Examination-Participation Restrictions Community Activity;Shop;Yard Work   ? Stability/Clinical Decision Making Evolving/Moderate complexity   ? Rehab Potential Good   ? PT Frequency 2x / week   ? PT Duration 12 weeks   ? PT Treatment/Interventions ADLs/Self Care Home Management;Canalith Repostioning;Cryotherapy;Iontophoresis '4mg'$ /ml Dexamethasone;Moist Heat;Gait training;Stair training;Functional mobility training;Therapeutic activities;Therapeutic  exercise;Balance training;Neuromuscular re-education;Patient/family education;Manual techniques;Passive range of motion;Dry needling;Splinting;Vestibular   ? PT Next Visit Plan Instruct in balance training a

## 2021-09-14 ENCOUNTER — Ambulatory Visit: Payer: Medicare Other | Attending: Neurology

## 2021-09-14 DIAGNOSIS — R2681 Unsteadiness on feet: Secondary | ICD-10-CM | POA: Diagnosis present

## 2021-09-14 DIAGNOSIS — M6281 Muscle weakness (generalized): Secondary | ICD-10-CM | POA: Insufficient documentation

## 2021-09-14 DIAGNOSIS — R278 Other lack of coordination: Secondary | ICD-10-CM | POA: Insufficient documentation

## 2021-09-14 DIAGNOSIS — R262 Difficulty in walking, not elsewhere classified: Secondary | ICD-10-CM | POA: Insufficient documentation

## 2021-09-14 DIAGNOSIS — R269 Unspecified abnormalities of gait and mobility: Secondary | ICD-10-CM | POA: Insufficient documentation

## 2021-09-14 NOTE — Therapy (Signed)
[facility] ?[place], [place], [address] ?Phone: [phone]   Fax:  [phone] ? ?Physical Therapy Treatment ? ?Patient Details  ?Name: Tracie Fisher ?MRN: [id number] ?Date of Birth: 1936-01-21 ?Referring Provider (PT): Gurney Maxin ? ? ?Encounter Date: 09/14/2021 ? ? PT End of Session - 09/14/21 2218   ? ? Visit Number 4   ? Number of Visits 25   ? Date for PT Re-Evaluation 11/25/21   ? Authorization Time Period 09/02/2021- 11/25/2021   ? Progress Note Due on Visit 10   ? PT Start Time 1100   ? PT Stop Time 6237   ? PT Time Calculation (min) 42 min   ? Equipment Utilized During Treatment Gait belt   ? Activity Tolerance Patient tolerated treatment well   ? Behavior During Therapy [facility] for tasks assessed/performed   ? ?  ?  ? ?  ? ? ?Past Medical History:  ?Diagnosis Date  ? Cancer [facility])   ? SKIN  ? Complication of anesthesia   ? Depression   ? Dysrhythmia   ? GERD (gastroesophageal reflux disease)   ? Hiatal hernia   ? HOH (hard of hearing)   ? Hypertension   ? Hypothyroidism   ? PONV (postoperative nausea and vomiting)   ? Thyroid disease   ? ? ?Past Surgical History:  ?Procedure Laterality Date  ? APPENDECTOMY    ? BACK SURGERY    ? MULTIPLE/ROD IN BACK  ? BREAST CYST ASPIRATION Left "years ago"  ? CATARACT EXTRACTION W/PHACO Left 03/21/2017  ? Procedure: CATARACT EXTRACTION PHACO AND INTRAOCULAR LENS PLACEMENT (IOC);  Surgeon: Birder Robson, MD;  Location: [facility] ORS;  Service: Ophthalmology;  Laterality: Left;  [place] 00:48 ?AP% 17.8 ?CDE 8.57 ?Fluid pack lot # [id number]  ? CATARACT EXTRACTION W/PHACO Right 04/11/2017  ? Procedure: CATARACT EXTRACTION PHACO AND INTRAOCULAR LENS PLACEMENT (IOC);  Surgeon: Birder Robson, MD;  Location: [facility] ORS;  Service: Ophthalmology;  Laterality: Right;  [place] 01:09.5 ?AP% 15.8 ?CDE 11.04 ?Fluid Pack lot # [id number]  ? CHOLECYSTECTOMY    ? COLON SURGERY    ? BOWEL OBSTRUCTION  ? COLONOSCOPY WITH PROPOFOL N/A  04/25/2016  ? Procedure: COLONOSCOPY WITH PROPOFOL;  Surgeon: Jonathon Bellows, MD;  Location: [facility] ENDOSCOPY;  Service: Endoscopy;  Laterality: N/A;  ? ESOPHAGOGASTRODUODENOSCOPY (EGD) WITH PROPOFOL N/A 04/25/2016  ? Procedure: ESOPHAGOGASTRODUODENOSCOPY (EGD) WITH PROPOFOL;  Surgeon: Jonathon Bellows, MD;  Location: [facility] ENDOSCOPY;  Service: Endoscopy;  Laterality: N/A;  ? EXPLORATORY LAPAROTOMY  10/18/2011  ? blockage small intestines  ? GIVENS CAPSULE STUDY N/A 05/11/2016  ? Procedure: GIVENS CAPSULE STUDY;  Surgeon: Jonathon Bellows, MD;  Location: [facility] ENDOSCOPY;  Service: Endoscopy;  Laterality: N/A;  ? ? ?There were no vitals filed for this visit. ? ? Subjective Assessment - 09/14/21 1106   ? ? Subjective Patient reports she has been very tired last few days with no energy.   ? Pertinent History Patient presents with referral for polyneuropathy/unsteadiness on feet. She endorses past medical history significant for HTN, skin cancer, Neuropathy with bilateral foot pain, Chronic back pain, OA.   ? Limitations Lifting;Standing;Walking;House hold activities   ? How long can you sit comfortably? No difficulty   ? How long can you stand comfortably? <30 min   ? How long can you walk comfortably? <5 min   ? Patient Stated Goals I just want to walk better.   ? Currently in Pain? No/denies   ? ?  ?  ? ?  ? ? ? ? ? ? ?  INTERVENTIONS:  ? ?Seated LE strengthening: ? ?Seated hip march- 2 sets of 12 reps with 2 ?Seateed knee ext- 2 sets of 12 reps with 2lb AW alt LE ?Seated hip add squeeze with red ball- hold 5 sec  ?Seated hip abd pull with GTB x 12 reps x 2 sets ?Seated heel raises 2lb x 12 reps x 2 sets  ? ?Patient amabulated around 300 feet with CGA and no AD exhibting very short reciprocal steps ? ? ?Access Code: [email or web address] ?URL: [email or web address] ?Date: 09/14/2021 ?Prepared by: Sande Brothers ? ?Exercises ?- Seated Hip Flexion March with Ankle Weights  - 1 x daily - 3 x weekly - 3 sets - 10 reps ?- Seated Hip  Adduction Isometrics with Ball  - 1 x daily - 3 x weekly - 3 sets - 10 reps ?- Seated Hip Abduction with Pelvic Floor Contraction and Resistance Loop  - 1 x daily - 3 x weekly - 3 sets - 10 reps ?- Seated Long Arc Quad  - 1 x daily - 3 x weekly - 3 sets - 10 reps ?- Seated Heel Raise  - 1 x daily - 3 x weekly - 3 sets - 10 reps ? ? ? ? ? ? ? ? ? ? ? ? ? ? ? ? ? ? ? ? ? PT Short Term Goals - 09/03/21 1151   ? ?  ? PT SHORT TERM GOAL #1  ? Title Pt will be independent with initial HEP in order to improve strength and balance in order to decrease fall risk and improve function at home and work.   ? Baseline 09/02/2021= No formal HEP in place   ? Time 6   ? Period Weeks   ? Status New   ? Target Date 10/14/21   ? ?  ?  ? ?  ? ? ? ? PT Long Term Goals - 09/03/21 1152   ? ?  ? PT LONG TERM GOAL #1  ? Title Pt will be independent with final HEP in order to improve strength and balance in order to decrease fall risk and improve function at home and work.   ? Baseline 09/02/2021= No formal HEP in place   ? Time 12   ? Period Weeks   ? Status New   ? Target Date 11/25/21   ?  ? PT LONG TERM GOAL #2  ? Title Pt will improve FOTO to target score of 53 to display perceived improvements in ability to complete ADL's   ? Baseline 09/02/2021= 50   ? Time 12   ? Period Weeks   ? Status New   ? Target Date 11/25/21   ?  ? PT LONG TERM GOAL #3  ? Title Pt will improve BERG by at least 5 points in order to demonstrate clinically significant improvement in balance.   ? Baseline 09/02/2021= 42/56   ? Time 12   ? Period Weeks   ? Status New   ? Target Date 11/25/21   ?  ? PT LONG TERM GOAL #4  ? Title Pt will decrease 5TSTS by at least 6 seconds in order to demonstrate clinically significant improvement in LE strength.   ? Baseline 09/02/2021= 22.63 sec without UE support   ? Time 12   ? Period Weeks   ? Status New   ? Target Date 11/25/21   ?  ? PT LONG TERM GOAL #5  ? Title Pt will decrease TUG to below 14 seconds/decrease in  order to  demonstrate decreased fall risk.   ? Baseline 09/02/2021= 17.9 sec without an AD   ? Time 12   ? Period Weeks   ? Status New   ? Target Date 11/25/21   ?  ? Additional Long Term Goals  ? Additional Long Term Goals Yes   ?  ? PT LONG TERM GOAL #6  ? Title Pt will increase 10MWT by at least 0.2 m/s in order to demonstrate clinically significant improvement in community ambulation   ? Baseline 09/02/2021=0.56 m/s without an AD   ? Time 12   ? Period Weeks   ? Status New   ? Target Date 11/25/21   ? ?  ?  ? ?  ? ? ? ? ? ? ? ? Plan - 09/14/21 2214   ? ? Clinical Impression Statement Patient presents with fair motivation for today's session. She was endorsing fatigue upon arrival. She was instructed in more seated LE strengthening today and educated to perform these on days when she was not feeling as well. She performed well with instruction today and able to return demonstration of seated therex well without difficulty. Patient will continue to benefit from skilled physical therapy services in order to improve her mobility, endurance, balance, reduce her risk of falls, and improve her overall quality of life.   ? Personal Factors and Comorbidities Comorbidity 2   ? Comorbidities HTN, OA   ? Examination-Activity Limitations Bend;Caring for Others;Carry;Lift;Squat;Stairs;Transfers;Stand   ? Examination-Participation Restrictions Community Activity;Shop;Yard Work   ? Stability/Clinical Decision Making Evolving/Moderate complexity   ? Rehab Potential Good   ? PT Frequency 2x / week   ? PT Duration 12 weeks   ? PT Treatment/Interventions ADLs/Self Care Home Management;Canalith Repostioning;Cryotherapy;Iontophoresis '4mg'$ /ml Dexamethasone;Moist Heat;Gait training;Stair training;Functional mobility training;Therapeutic activities;Therapeutic exercise;Balance training;Neuromuscular re-education;Patient/family education;Manual techniques;Passive range of motion;Dry needling;Splinting;Vestibular   ? PT Next Visit Plan Instruct in  balance training and LE strengthening   ? PT Home Exercise Plan 09/14/2021=Access Code: [facility]  URL: [email or web address]   ? Consulted and Agree with Plan of Care Patient   ? ?  ?  ? ?  ? ? ?Patient wi

## 2021-09-16 ENCOUNTER — Ambulatory Visit: Payer: Medicare Other | Admitting: Physical Therapy

## 2021-09-16 DIAGNOSIS — R2681 Unsteadiness on feet: Secondary | ICD-10-CM

## 2021-09-16 DIAGNOSIS — M6281 Muscle weakness (generalized): Secondary | ICD-10-CM

## 2021-09-16 DIAGNOSIS — R262 Difficulty in walking, not elsewhere classified: Secondary | ICD-10-CM

## 2021-09-16 NOTE — Therapy (Signed)
[place] [facility] ?[place], [place], [address] ?Phone: [phone]   Fax:  [phone] ? ?Physical Therapy Treatment ? ?Patient Details  ?Name: Tracie Fisher ?MRN: [id number] ?Date of Birth: February 21, 1936 ?Referring Provider (PT): Gurney Maxin ? ? ?Encounter Date: 09/16/2021 ? ? PT End of Session - 09/16/21 1152   ? ? Visit Number 5   ? Number of Visits 25   ? Date for PT Re-Evaluation 11/25/21   ? Authorization Time Period 09/02/2021- 11/25/2021   ? Progress Note Due on Visit 10   ? PT Start Time 1148   ? PT Stop Time 7673   ? PT Time Calculation (min) 41 min   ? Equipment Utilized During Treatment Gait belt   ? Activity Tolerance Patient tolerated treatment well   ? Behavior During Therapy [facility] for tasks assessed/performed   ? ?  ?  ? ?  ? ? ?Past Medical History:  ?Diagnosis Date  ? Cancer [facility])   ? SKIN  ? Complication of anesthesia   ? Depression   ? Dysrhythmia   ? GERD (gastroesophageal reflux disease)   ? Hiatal hernia   ? HOH (hard of hearing)   ? Hypertension   ? Hypothyroidism   ? PONV (postoperative nausea and vomiting)   ? Thyroid disease   ? ? ?Past Surgical History:  ?Procedure Laterality Date  ? APPENDECTOMY    ? BACK SURGERY    ? MULTIPLE/ROD IN BACK  ? BREAST CYST ASPIRATION Left "years ago"  ? CATARACT EXTRACTION W/PHACO Left 03/21/2017  ? Procedure: CATARACT EXTRACTION PHACO AND INTRAOCULAR LENS PLACEMENT (IOC);  Surgeon: Birder Robson, MD;  Location: [facility] ORS;  Service: Ophthalmology;  Laterality: Left;  [place] 00:48 ?AP% 17.8 ?CDE 8.57 ?Fluid pack lot # [id number]  ? CATARACT EXTRACTION W/PHACO Right 04/11/2017  ? Procedure: CATARACT EXTRACTION PHACO AND INTRAOCULAR LENS PLACEMENT (IOC);  Surgeon: Birder Robson, MD;  Location: [facility] ORS;  Service: Ophthalmology;  Laterality: Right;  [place] 01:09.5 ?AP% 15.8 ?CDE 11.04 ?Fluid Pack lot # [id number]  ? CHOLECYSTECTOMY    ? COLON SURGERY    ? BOWEL OBSTRUCTION  ? COLONOSCOPY WITH PROPOFOL N/A  04/25/2016  ? Procedure: COLONOSCOPY WITH PROPOFOL;  Surgeon: Jonathon Bellows, MD;  Location: [facility] ENDOSCOPY;  Service: Endoscopy;  Laterality: N/A;  ? ESOPHAGOGASTRODUODENOSCOPY (EGD) WITH PROPOFOL N/A 04/25/2016  ? Procedure: ESOPHAGOGASTRODUODENOSCOPY (EGD) WITH PROPOFOL;  Surgeon: Jonathon Bellows, MD;  Location: [facility] ENDOSCOPY;  Service: Endoscopy;  Laterality: N/A;  ? EXPLORATORY LAPAROTOMY  10/18/2011  ? blockage small intestines  ? GIVENS CAPSULE STUDY N/A 05/11/2016  ? Procedure: GIVENS CAPSULE STUDY;  Surgeon: Jonathon Bellows, MD;  Location: [facility] ENDOSCOPY;  Service: Endoscopy;  Laterality: N/A;  ? ? ?There were no vitals filed for this visit. ? ? Subjective Assessment - 09/16/21 1151   ? ? Subjective Pt reports she has been able to complete her HEP and was able to get a ball for required exercises.   ? Pertinent History Patient presents with referral for polyneuropathy/unsteadiness on feet. She endorses past medical history significant for HTN, skin cancer, Neuropathy with bilateral foot pain, Chronic back pain, OA.   ? Limitations Lifting;Standing;Walking;House hold activities   ? How long can you sit comfortably? No difficulty   ? How long can you stand comfortably? <30 min   ? How long can you walk comfortably? <5 min   ? Patient Stated Goals I just want to walk better.   ? ?  ?  ? ?  ? ? ?  Exercise/Activity Sets/Reps/Time/ Resistance Assistance Charge type Comments  ?LAQ 2 x 10 x 2.5#  Therex    ?Hurdle step over  X 10 ea x 2.5#   Therex Cues or preventing knee extension compensation for completion of activity.   ?1 LE on step 1 LE on 6 inch step  2 x 35 sec ea  CGA NMR To iprove SLS ability   ?Airex step up and down  X 10  CGA   NMR  To improve dynamic balance with transitions to unsteady surfaces   ?Airex stance adducted stance with head turns  X 1 minute lateral and sup/ inferior head turns  CGA NMR  No significnat lOB noted   ?Airex balnce beam side stepping  X 5 laps  CGA, min UE  NMR  Fatigue noted toward end  but no LOB noted, no back pain at this time.   ?STS  2 x 6  CGA  Therex A little hard toward end of repetitions, fatigue noted with increased effort near end of repetitions.   ?Ambulation x 60 feet    therex No AD, no fatigue noted, some instability with turns ( ambulated from treatment gym to waiting area then pt waited in transport chair for volunteer to bring her to entrance)   ?      ?      ?      ?Treatment Provided this session  ? ?Pt educated throughout session about proper posture and technique with exercises. Improved exercise technique, movement at target joints, use of target muscles after min to mod verbal, visual, tactile cues. ?Note: Portions of this document were prepared using Dragon voice recognition software and although reviewed may contain unintentional dictation errors in syntax, grammar, or spelling. ? ? ? ? ? ? ? ? ? ? ? ? ? ? ? ? ? ? ? ? ? ? ? PT Education - 09/16/21 1151   ? ? Education Details Exercise form and technique   ? Person(s) Educated Patient   ? Methods Explanation;Demonstration   ? Comprehension Verbalized understanding;Returned demonstration   ? ?  ?  ? ?  ? ? ? PT Short Term Goals - 09/03/21 1151   ? ?  ? PT SHORT TERM GOAL #1  ? Title Pt will be independent with initial HEP in order to improve strength and balance in order to decrease fall risk and improve function at home and work.   ? Baseline 09/02/2021= No formal HEP in place   ? Time 6   ? Period Weeks   ? Status New   ? Target Date 10/14/21   ? ?  ?  ? ?  ? ? ? ? PT Long Term Goals - 09/03/21 1152   ? ?  ? PT LONG TERM GOAL #1  ? Title Pt will be independent with final HEP in order to improve strength and balance in order to decrease fall risk and improve function at home and work.   ? Baseline 09/02/2021= No formal HEP in place   ? Time 12   ? Period Weeks   ? Status New   ? Target Date 11/25/21   ?  ? PT LONG TERM GOAL #2  ? Title Pt will improve FOTO to target score of 53 to display perceived improvements in ability to  complete ADL's   ? Baseline 09/02/2021= 50   ? Time 12   ? Period Weeks   ? Status New   ? Target Date 11/25/21   ?  ?  PT LONG TERM GOAL #3  ? Title Pt will improve BERG by at least 5 points in order to demonstrate clinically significant improvement in balance.   ? Baseline 09/02/2021= 42/56   ? Time 12   ? Period Weeks   ? Status New   ? Target Date 11/25/21   ?  ? PT LONG TERM GOAL #4  ? Title Pt will decrease 5TSTS by at least 6 seconds in order to demonstrate clinically significant improvement in LE strength.   ? Baseline 09/02/2021= 22.63 sec without UE support   ? Time 12   ? Period Weeks   ? Status New   ? Target Date 11/25/21   ?  ? PT LONG TERM GOAL #5  ? Title Pt will decrease TUG to below 14 seconds/decrease in order to demonstrate decreased fall risk.   ? Baseline 09/02/2021= 17.9 sec without an AD   ? Time 12   ? Period Weeks   ? Status New   ? Target Date 11/25/21   ?  ? Additional Long Term Goals  ? Additional Long Term Goals Yes   ?  ? PT LONG TERM GOAL #6  ? Title Pt will increase 10MWT by at least 0.2 m/s in order to demonstrate clinically significant improvement in community ambulation   ? Baseline 09/02/2021=0.56 m/s without an AD   ? Time 12   ? Period Weeks   ? Status New   ? Target Date 11/25/21   ? ?  ?  ? ?  ? ? ? ? ? ? ? ? Plan - 09/16/21 1323   ? ? Clinical Impression Statement Patient presents with excellent motivation for completion of physical therapy activities.  Patient continues to progress with seated as well as standing muscular strength, endurance, and balance activities in order to improve her safety and mobility.  Patient demonstrated improved balance reactions with several activities performed today included Airex sidestepping as well as activities to progress single-leg stance.  Patient reports no pain at the end of session or beginning of session patient demonstrates good walking mechanics upon leaving therapy at this session.  Patient will continue to benefit from skilled  physical therapy intervention in order to improve her lower extremity strength, endurance, balance, prevent falls, and improve her quality of life.   ? Personal Factors and Comorbidities Comorbidity 2   ? Comorb

## 2021-09-21 ENCOUNTER — Encounter: Payer: Self-pay | Admitting: Physical Therapy

## 2021-09-21 ENCOUNTER — Ambulatory Visit: Payer: Medicare Other | Admitting: Physical Therapy

## 2021-09-21 DIAGNOSIS — R2681 Unsteadiness on feet: Secondary | ICD-10-CM

## 2021-09-21 DIAGNOSIS — R278 Other lack of coordination: Secondary | ICD-10-CM

## 2021-09-21 DIAGNOSIS — R269 Unspecified abnormalities of gait and mobility: Secondary | ICD-10-CM

## 2021-09-21 DIAGNOSIS — R262 Difficulty in walking, not elsewhere classified: Secondary | ICD-10-CM

## 2021-09-21 DIAGNOSIS — M6281 Muscle weakness (generalized): Secondary | ICD-10-CM

## 2021-09-21 NOTE — Therapy (Signed)
[place] [facility] ?[place], [place], [address] ?Phone: [phone]   Fax:  [phone] ? ?Physical Therapy Treatment ? ?Patient Details  ?Name: Tracie Fisher ?MRN: [id number] ?Date of Birth: 1935-06-28 ?Referring Provider (PT): Gurney Maxin ? ? ?Encounter Date: 09/21/2021 ? ? PT End of Session - 09/21/21 1110   ? ? Visit Number 6   ? Number of Visits 25   ? Date for PT Re-Evaluation 11/25/21   ? Authorization Time Period 09/02/2021- 11/25/2021   ? Progress Note Due on Visit 10   ? PT Start Time 1105   ? PT Stop Time 1145   ? PT Time Calculation (min) 40 min   ? Equipment Utilized During Treatment Gait belt   ? Activity Tolerance Patient tolerated treatment well   ? Behavior During Therapy [facility] for tasks assessed/performed   ? ?  ?  ? ?  ? ? ?Past Medical History:  ?Diagnosis Date  ? Cancer [facility])   ? SKIN  ? Complication of anesthesia   ? Depression   ? Dysrhythmia   ? GERD (gastroesophageal reflux disease)   ? Hiatal hernia   ? HOH (hard of hearing)   ? Hypertension   ? Hypothyroidism   ? PONV (postoperative nausea and vomiting)   ? Thyroid disease   ? ? ?Past Surgical History:  ?Procedure Laterality Date  ? APPENDECTOMY    ? BACK SURGERY    ? MULTIPLE/ROD IN BACK  ? BREAST CYST ASPIRATION Left "years ago"  ? CATARACT EXTRACTION W/PHACO Left 03/21/2017  ? Procedure: CATARACT EXTRACTION PHACO AND INTRAOCULAR LENS PLACEMENT (IOC);  Surgeon: Birder Robson, MD;  Location: [facility] ORS;  Service: Ophthalmology;  Laterality: Left;  [place] 00:48 ?AP% 17.8 ?CDE 8.57 ?Fluid pack lot # [id number]  ? CATARACT EXTRACTION W/PHACO Right 04/11/2017  ? Procedure: CATARACT EXTRACTION PHACO AND INTRAOCULAR LENS PLACEMENT (IOC);  Surgeon: Birder Robson, MD;  Location: [facility] ORS;  Service: Ophthalmology;  Laterality: Right;  [place] 01:09.5 ?AP% 15.8 ?CDE 11.04 ?Fluid Pack lot # [id number]  ? CHOLECYSTECTOMY    ? COLON SURGERY    ? BOWEL OBSTRUCTION  ? COLONOSCOPY WITH PROPOFOL N/A  04/25/2016  ? Procedure: COLONOSCOPY WITH PROPOFOL;  Surgeon: Jonathon Bellows, MD;  Location: [facility] ENDOSCOPY;  Service: Endoscopy;  Laterality: N/A;  ? ESOPHAGOGASTRODUODENOSCOPY (EGD) WITH PROPOFOL N/A 04/25/2016  ? Procedure: ESOPHAGOGASTRODUODENOSCOPY (EGD) WITH PROPOFOL;  Surgeon: Jonathon Bellows, MD;  Location: [facility] ENDOSCOPY;  Service: Endoscopy;  Laterality: N/A;  ? EXPLORATORY LAPAROTOMY  10/18/2011  ? blockage small intestines  ? GIVENS CAPSULE STUDY N/A 05/11/2016  ? Procedure: GIVENS CAPSULE STUDY;  Surgeon: Jonathon Bellows, MD;  Location: [facility] ENDOSCOPY;  Service: Endoscopy;  Laterality: N/A;  ? ? ?There were no vitals filed for this visit. ? ? Subjective Assessment - 09/21/21 1109   ? ? Subjective Pt reports she has been doing her HEP 2-3 x a week; She reports mostly doing seated exercise but will occasionally do standing exercise. Denies any pain or soreness today; "I try not to do anything that will make my back hurt." She reports her back will bother her with prolonged standing;   ? Pertinent History Patient presents with referral for polyneuropathy/unsteadiness on feet. She endorses past medical history significant for HTN, skin cancer, Neuropathy with bilateral foot pain, Chronic back pain, OA.   ? Limitations Lifting;Standing;Walking;House hold activities   ? How long can you sit comfortably? No difficulty   ? How long can you stand  comfortably? <30 min   ? How long can you walk comfortably? <5 min   ? Patient Stated Goals I just want to walk better.   ? Currently in Pain? No/denies   ? ?  ?  ? ?  ? ? ? ?TREATMENT: ? ?Seated with 2# ankle weight: ?Seated March x12 reps each LE with cues to tighten abdominal muscle for less pull on low back ?Seated LAQ x12 reps each LE with cues for ankle DF for increased stretch; ? ?Pt appears short of breath, SPO2 99%, HR 52 ? ?Standing with 2# ankle weight: ?-hip abduction x10 reps each LE ?-BLE heel raises x10 reps ?-alternate toe taps firm to 6 inch step with BUE rail assist  x10 reps each LE ? ?Standing with 2# ankle weights: ?Forward/backward step over 1/2 bolster with 1 rail assist x10 reps ?Side stepping over 1/2 bolster with 1 rail assist x10 reps each LE ?Required min VCS to increase step length for better foot clearance;  ? ? Patient instructed in advanced balance exercise ? ?Standing beside support: ? ?Standing on airex foam: ?-feet together:  ?Unsupported eyes open 30 sec hold, eyes closed 15 sec hold x2 sets each ?-Standing one foot on airex, one foot on 4 inch step,  ?Unsupported standing 30 sec hold x1 rep each foot on step;  ?Head turns side/side x5 reps each foot on step ?Pt exhibits increased unsteadiness when standing with eyes closed;  ?Patient required min VCs for balance stability, including to increase trunk control for less loss of balance with smaller base of support ? ? ? ? ? ? ? ? ? ? ? ? ? ? ? ? ? ? ? ? ? ? ? ? ? PT Education - 09/21/21 1110   ? ? Education Details exercise technique/positioning;   ? Person(s) Educated Patient   ? Methods Explanation;Verbal cues   ? Comprehension Verbalized understanding;Returned demonstration;Verbal cues required;Need further instruction   ? ?  ?  ? ?  ? ? ? PT Short Term Goals - 09/03/21 1151   ? ?  ? PT SHORT TERM GOAL #1  ? Title Pt will be independent with initial HEP in order to improve strength and balance in order to decrease fall risk and improve function at home and work.   ? Baseline 09/02/2021= No formal HEP in place   ? Time 6   ? Period Weeks   ? Status New   ? Target Date 10/14/21   ? ?  ?  ? ?  ? ? ? ? PT Long Term Goals - 09/03/21 1152   ? ?  ? PT LONG TERM GOAL #1  ? Title Pt will be independent with final HEP in order to improve strength and balance in order to decrease fall risk and improve function at home and work.   ? Baseline 09/02/2021= No formal HEP in place   ? Time 12   ? Period Weeks   ? Status New   ? Target Date 11/25/21   ?  ? PT LONG TERM GOAL #2  ? Title Pt will improve FOTO to target score of 53  to display perceived improvements in ability to complete ADL's   ? Baseline 09/02/2021= 50   ? Time 12   ? Period Weeks   ? Status New   ? Target Date 11/25/21   ?  ? PT LONG TERM GOAL #3  ? Title Pt will improve BERG by at least 5 points in order to  demonstrate clinically significant improvement in balance.   ? Baseline 09/02/2021= 42/56   ? Time 12   ? Period Weeks   ? Status New   ? Target Date 11/25/21   ?  ? PT LONG TERM GOAL #4  ? Title Pt will decrease 5TSTS by at least 6 seconds in order to demonstrate clinically significant improvement in LE strength.   ? Baseline 09/02/2021= 22.63 sec without UE support   ? Time 12   ? Period Weeks   ? Status New   ? Target Date 11/25/21   ?  ? PT LONG TERM GOAL #5  ? Title Pt will decrease TUG to below 14 seconds/decrease in order to demonstrate decreased fall risk.   ? Baseline 09/02/2021= 17.9 sec without an AD   ? Time 12   ? Period Weeks   ? Status New   ? Target Date 11/25/21   ?  ? Additional Long Term Goals  ? Additional Long Term Goals Yes   ?  ? PT LONG TERM GOAL #6  ? Title Pt will increase 10MWT by at least 0.2 m/s in order to demonstrate clinically significant improvement in community ambulation   ? Baseline 09/02/2021=0.56 m/s without an AD   ? Time 12   ? Period Weeks   ? Status New   ? Target Date 11/25/21   ? ?  ?  ? ?  ? ? ? ? ? ? ? ? Plan - 09/21/21 1210   ? ? Clinical Impression Statement Patient motivated and participated well within session. she was instructed in advanced LE strengthening exercise. She does exhibit shortness of breath during session requiring short rest breaks. vitals assessed WNL. She does require rail assist with advanced standing exercise. Patient does require min VCS for proper exercise technique and positioning for optimal muscle activation and to reduce strain on low back. Patient instructed in balance exercise. She does require CGA to min A when standing on airex pad especially when unsupported. She exhibits increased difficulty  when standing with eyes closed. She would benefit from additional skilled PT Intervention to improve strength, balance and mobility;   ? Personal Factors and Comorbidities Comorbidity 2   ? Comorbidities H

## 2021-09-23 ENCOUNTER — Ambulatory Visit: Payer: Medicare Other | Admitting: Physical Therapy

## 2021-09-23 DIAGNOSIS — R262 Difficulty in walking, not elsewhere classified: Secondary | ICD-10-CM

## 2021-09-23 DIAGNOSIS — R269 Unspecified abnormalities of gait and mobility: Secondary | ICD-10-CM

## 2021-09-23 DIAGNOSIS — R278 Other lack of coordination: Secondary | ICD-10-CM

## 2021-09-23 DIAGNOSIS — M6281 Muscle weakness (generalized): Secondary | ICD-10-CM

## 2021-09-23 DIAGNOSIS — R2681 Unsteadiness on feet: Secondary | ICD-10-CM | POA: Diagnosis not present

## 2021-09-23 NOTE — Therapy (Signed)
[place] [facility] ?[place], [place], [address] ?Phone: [phone]   Fax:  [phone] ? ?Physical Therapy Treatment ? ?Patient Details  ?Name: Tracie Fisher ?MRN: [id number] ?Date of Birth: 04-02-1936 ?Referring Provider (PT): Gurney Maxin ? ? ?Encounter Date: 09/23/2021 ? ? PT End of Session - 09/24/21 1017   ? ? Visit Number 7   ? Number of Visits 25   ? Date for PT Re-Evaluation 11/25/21   ? Authorization Time Period 09/02/2021- 11/25/2021   ? Progress Note Due on Visit 10   ? PT Start Time 5102   ? PT Stop Time 5852   ? PT Time Calculation (min) 43 min   ? Equipment Utilized During Treatment Gait belt   ? Activity Tolerance Patient tolerated treatment well   ? Behavior During Therapy [facility] for tasks assessed/performed   ? ?  ?  ? ?  ? ? ?Past Medical History:  ?Diagnosis Date  ? Cancer [facility])   ? SKIN  ? Complication of anesthesia   ? Depression   ? Dysrhythmia   ? GERD (gastroesophageal reflux disease)   ? Hiatal hernia   ? HOH (hard of hearing)   ? Hypertension   ? Hypothyroidism   ? PONV (postoperative nausea and vomiting)   ? Thyroid disease   ? ? ?Past Surgical History:  ?Procedure Laterality Date  ? APPENDECTOMY    ? BACK SURGERY    ? MULTIPLE/ROD IN BACK  ? BREAST CYST ASPIRATION Left "years ago"  ? CATARACT EXTRACTION W/PHACO Left 03/21/2017  ? Procedure: CATARACT EXTRACTION PHACO AND INTRAOCULAR LENS PLACEMENT (IOC);  Surgeon: Birder Robson, MD;  Location: [facility] ORS;  Service: Ophthalmology;  Laterality: Left;  [place] 00:48 ?AP% 17.8 ?CDE 8.57 ?Fluid pack lot # [id number]  ? CATARACT EXTRACTION W/PHACO Right 04/11/2017  ? Procedure: CATARACT EXTRACTION PHACO AND INTRAOCULAR LENS PLACEMENT (IOC);  Surgeon: Birder Robson, MD;  Location: [facility] ORS;  Service: Ophthalmology;  Laterality: Right;  [place] 01:09.5 ?AP% 15.8 ?CDE 11.04 ?Fluid Pack lot # [id number]  ? CHOLECYSTECTOMY    ? COLON SURGERY    ? BOWEL OBSTRUCTION  ? COLONOSCOPY WITH PROPOFOL N/A  04/25/2016  ? Procedure: COLONOSCOPY WITH PROPOFOL;  Surgeon: Jonathon Bellows, MD;  Location: [facility] ENDOSCOPY;  Service: Endoscopy;  Laterality: N/A;  ? ESOPHAGOGASTRODUODENOSCOPY (EGD) WITH PROPOFOL N/A 04/25/2016  ? Procedure: ESOPHAGOGASTRODUODENOSCOPY (EGD) WITH PROPOFOL;  Surgeon: Jonathon Bellows, MD;  Location: [facility] ENDOSCOPY;  Service: Endoscopy;  Laterality: N/A;  ? EXPLORATORY LAPAROTOMY  10/18/2011  ? blockage small intestines  ? GIVENS CAPSULE STUDY N/A 05/11/2016  ? Procedure: GIVENS CAPSULE STUDY;  Surgeon: Jonathon Bellows, MD;  Location: [facility] ENDOSCOPY;  Service: Endoscopy;  Laterality: N/A;  ? ? ?There were no vitals filed for this visit. ? ? Subjective Assessment - 09/23/21 1437   ? ? Subjective Pt reports her right thumb and wrist is still really sore. She took Tylenol before she came to therapy; She is following up with the doctor next week;   ? Pertinent History Patient presents with referral for polyneuropathy/unsteadiness on feet. She endorses past medical history significant for HTN, skin cancer, Neuropathy with bilateral foot pain, Chronic back pain, OA.   ? Limitations Lifting;Standing;Walking;House hold activities   ? How long can you sit comfortably? No difficulty   ? How long can you stand comfortably? <30 min   ? How long can you walk comfortably? <5 min   ? Patient Stated Goals I just want to  walk better.   ? Currently in Pain? No/denies   ? ?  ?  ? ?  ? ? ? ? ? ? ? ? ? ? ? ?  ?TREATMENT: ?  ?Seated with 2# ankle weight: ?Seated March x12 reps each LE with cues to tighten abdominal muscle for less pull on low back ?Seated LAQ x12 reps each LE with cues for ankle DF for increased stretch; ?  ?Pt appears short of breath, SPO2 94%, HR 71 ?  ?Standing with 2# ankle weight: ?-hip abduction x15 reps each LE ?-BLE heel raises x15 reps ?  ?Standing with 2# ankle weights: ?Forward/backward step over 1/2 bolster with 1 rail assist x10 reps ?Side stepping over 1/2 bolster with 1 rail assist x10 reps each  LE ?Required min VCS to increase step length for better foot clearance;  ? ?Forward step up on 6 inch step with 2 rail assist x10 reps each LE ?Does appear short of breath with increased repetition requiring short rest break;  ?  ?She tolerated session well. She does report increased fatigue but denies any increase in soreness;   ?  ?  ?  ?  ? ? ? ? ? ? ? ? ? ? ? ? ? ? ? ? ? PT Education - 09/24/21 0918   ? ? Education Details exercise technique/positioning;   ? Person(s) Educated Patient   ? Methods Explanation;Verbal cues   ? Comprehension Verbalized understanding;Returned demonstration;Verbal cues required;Need further instruction   ? ?  ?  ? ?  ? ? ? PT Short Term Goals - 09/03/21 1151   ? ?  ? PT SHORT TERM GOAL #1  ? Title Pt will be independent with initial HEP in order to improve strength and balance in order to decrease fall risk and improve function at home and work.   ? Baseline 09/02/2021= No formal HEP in place   ? Time 6   ? Period Weeks   ? Status New   ? Target Date 10/14/21   ? ?  ?  ? ?  ? ? ? ? PT Long Term Goals - 09/03/21 1152   ? ?  ? PT LONG TERM GOAL #1  ? Title Pt will be independent with final HEP in order to improve strength and balance in order to decrease fall risk and improve function at home and work.   ? Baseline 09/02/2021= No formal HEP in place   ? Time 12   ? Period Weeks   ? Status New   ? Target Date 11/25/21   ?  ? PT LONG TERM GOAL #2  ? Title Pt will improve FOTO to target score of 53 to display perceived improvements in ability to complete ADL's   ? Baseline 09/02/2021= 50   ? Time 12   ? Period Weeks   ? Status New   ? Target Date 11/25/21   ?  ? PT LONG TERM GOAL #3  ? Title Pt will improve BERG by at least 5 points in order to demonstrate clinically significant improvement in balance.   ? Baseline 09/02/2021= 42/56   ? Time 12   ? Period Weeks   ? Status New   ? Target Date 11/25/21   ?  ? PT LONG TERM GOAL #4  ? Title Pt will decrease 5TSTS by at least 6 seconds in order to  demonstrate clinically significant improvement in LE strength.   ? Baseline 09/02/2021= 22.63 sec without UE support   ?  Time 12   ? Period Weeks   ? Status New   ? Target Date 11/25/21   ?  ? PT LONG TERM GOAL #5  ? Title Pt will decrease TUG to below 14 seconds/decrease in order to demonstrate decreased fall risk.   ? Baseline 09/02/2021= 17.9 sec without an AD   ? Time 12   ? Period Weeks   ? Status New   ? Target Date 11/25/21   ?  ? Additional Long Term Goals  ? Additional Long Term Goals Yes   ?  ? PT LONG TERM GOAL #6  ? Title Pt will increase 10MWT by at least 0.2 m/s in order to demonstrate clinically significant improvement in community ambulation   ? Baseline 09/02/2021=0.56 m/s without an AD   ? Time 12   ? Period Weeks   ? Status New   ? Target Date 11/25/21   ? ?  ?  ? ?  ? ? ? ? ? ? ? ? Plan - 09/24/21 0918   ? ? Clinical Impression Statement Patient motivated and participated well within session. She was able to tolerate increased repetition of LE strengthening exercise. She does fatigue quickly with prolonged standing with increased shortness of breath. Vitals monitored with normal HR/Spo2 levels. Patient does require intermittent rest breaks for recovery. She was instructed in cardiac conditioning exercise to help improve stamina and reduce fatigue. Patient would benefit from additional skilled PT Intervention to improve strength, balance and mobility;   ? Personal Factors and Comorbidities Comorbidity 2   ? Comorbidities HTN, OA   ? Examination-Activity Limitations Bend;Caring for Others;Carry;Lift;Squat;Stairs;Transfers;Stand   ? Examination-Participation Restrictions Community Activity;Shop;Yard Work   ? Stability/Clinical Decision Making Evolving/Moderate complexity   ? Rehab Potential Good   ? PT Frequency 2x / week   ? PT Duration 12 weeks   ? PT Treatment/Interventions ADLs/Self Care Home Management;Canalith Repostioning;Cryotherapy;Iontophoresis '4mg'$ /ml Dexamethasone;Moist Heat;Gait  training;Stair training;Functional mobility training;Therapeutic activities;Therapeutic exercise;Balance training;Neuromuscular re-education;Patient/family education;Manual techniques;Passive range of motion;Dry needling;

## 2021-09-24 ENCOUNTER — Encounter: Payer: Self-pay | Admitting: Physical Therapy

## 2021-09-28 ENCOUNTER — Ambulatory Visit: Payer: Medicare Other

## 2021-09-28 DIAGNOSIS — R2681 Unsteadiness on feet: Secondary | ICD-10-CM

## 2021-09-28 DIAGNOSIS — R269 Unspecified abnormalities of gait and mobility: Secondary | ICD-10-CM

## 2021-09-28 DIAGNOSIS — R278 Other lack of coordination: Secondary | ICD-10-CM

## 2021-09-28 DIAGNOSIS — M6281 Muscle weakness (generalized): Secondary | ICD-10-CM

## 2021-09-28 DIAGNOSIS — R262 Difficulty in walking, not elsewhere classified: Secondary | ICD-10-CM

## 2021-09-28 NOTE — Therapy (Signed)
[facility] ?[place], [place], [address] ?Phone: [phone]   Fax:  [phone] ? ?Physical Therapy Treatment ? ?Patient Details  ?Name: Tracie Fisher ?MRN: [id number] ?Date of Birth: [birth year] ?Referring Provider (PT): Gurney Maxin ? ? ?Encounter Date: 09/28/2021 ? ? PT End of Session - 09/28/21 1116   ? ? Visit Number 8   ? Number of Visits 25   ? Date for PT Re-Evaluation 11/25/21   ? Authorization Time Period 09/02/2021- 11/25/2021   ? Progress Note Due on Visit 10   ? PT Start Time 1103   ? PT Stop Time [id number]   ? PT Time Calculation (min) 40 min   ? Equipment Utilized During Treatment Gait belt   ? Activity Tolerance Patient tolerated treatment well   ? Behavior During Therapy [facility] for tasks assessed/performed   ? ?  ?  ? ?  ? ? ?Past Medical History:  ?Diagnosis Date  ? Cancer [facility]   ? SKIN  ? Complication of anesthesia   ? Depression   ? Dysrhythmia   ? GERD (gastroesophageal reflux disease)   ? Hiatal hernia   ? HOH (hard of hearing)   ? Hypertension   ? Hypothyroidism   ? PONV (postoperative nausea and vomiting)   ? Thyroid disease   ? ? ?Past Surgical History:  ?Procedure Laterality Date  ? APPENDECTOMY    ? BACK SURGERY    ? MULTIPLE/ROD IN BACK  ? BREAST CYST ASPIRATION Left "years ago"  ? CATARACT EXTRACTION W/PHACO Left 03/21/2017  ? Procedure: CATARACT EXTRACTION PHACO AND INTRAOCULAR LENS PLACEMENT (IOC);  Surgeon: Birder Robson, MD;  Location: [facility] ORS;  Service: Ophthalmology;  Laterality: Left;  [place] 00:48 ?AP% 17.8 ?CDE 8.57 ?Fluid pack lot # [id number]  ? CATARACT EXTRACTION W/PHACO Right 04/11/2017  ? Procedure: CATARACT EXTRACTION PHACO AND INTRAOCULAR LENS PLACEMENT (IOC);  Surgeon: Birder Robson, MD;  Location: [facility] ORS;  Service: Ophthalmology;  Laterality: Right;  [place] 01:09.5 ?AP% 15.8 ?CDE 11.04 ?Fluid Pack lot # [id number]  ? CHOLECYSTECTOMY    ? COLON SURGERY    ? BOWEL OBSTRUCTION  ? COLONOSCOPY WITH PROPOFOL N/A  04/25/2016  ? Procedure: COLONOSCOPY WITH PROPOFOL;  Surgeon: Jonathon Bellows, MD;  Location: [facility] ENDOSCOPY;  Service: Endoscopy;  Laterality: N/A;  ? ESOPHAGOGASTRODUODENOSCOPY (EGD) WITH PROPOFOL N/A 04/25/2016  ? Procedure: ESOPHAGOGASTRODUODENOSCOPY (EGD) WITH PROPOFOL;  Surgeon: Jonathon Bellows, MD;  Location: [facility] ENDOSCOPY;  Service: Endoscopy;  Laterality: N/A;  ? EXPLORATORY LAPAROTOMY  10/18/2011  ? blockage small intestines  ? GIVENS CAPSULE STUDY N/A 05/11/2016  ? Procedure: GIVENS CAPSULE STUDY;  Surgeon: Jonathon Bellows, MD;  Location: [facility] ENDOSCOPY;  Service: Endoscopy;  Laterality: N/A;  ? ? ?There were no vitals filed for this visit. ? ? Subjective Assessment - 09/28/21 1110   ? ? Subjective Patient reports continued soreness in right thumb- now wearing a spica brace. States she overwalked at home using her [phone].   ? Pertinent History Patient presents with referral for polyneuropathy/unsteadiness on feet. She endorses past medical history significant for HTN, skin cancer, Neuropathy with bilateral foot pain, Chronic back pain, OA.   ? Limitations Lifting;Standing;Walking;House hold activities   ? How long can you sit comfortably? No difficulty   ? How long can you stand comfortably? <30 min   ? How long can you walk comfortably? <5 min   ? Patient Stated Goals I just want to walk better.   ? Currently in Pain?  Yes   ? Pain Score 3    ? Pain Location Other (Comment)   thumb  ? Pain Orientation Right   ? Pain Descriptors / Indicators Aching;Sore   ? Pain Type Chronic pain   ? Pain Onset More than a month ago   ? Pain Frequency Intermittent   ? Aggravating Factors  active Motion   ? Pain Relieving Factors Rest   ? ?  ?  ? ?  ? ? ?INTERVENTIONS:  ? ?Therapeutic Exercises:  ? ? ? ?Seated March 2 x12 reps alt LE 3lb AW ?Seated Knee Ext 2x12 reps each LE with 3lb AW ?VC to brace Abdominals   ?SPO2 100%, HR 60 ?  ?Standing with 3# ankle weight: ?-hip abduction x12 reps alt LE ?-BLE heel raises x12 reps BLE ?- Standing  step tap (side of TM with railing) x 12 reps alt LE  ?SPO2= 96%; HR= 66 bpm ? ?  ?Standing with 3# ankle weights: ?Forward/backward step over 1/2 bolster with 1 rail assist x10 reps ?Side stepping over 1/2 bolster with 1 rail assist x12 reps each LE without UE support- Patient did well without LOB- reported increased fatigue.  ?Required min VCS to increase step length for better foot clearance;  ?  ?  ?Education provided throughout session via VC/TC and demonstration to facilitate movement at target joints and correct muscle activation for all testing and exercises performed.  ? ? ? ? ? ? ? ? ? ? ? ? ? ? ? ? ? ? ? ? ? ? ? ? PT Education - 09/28/21 1115   ? ? Education Details Exercise technique   ? Person(s) Educated Patient   ? Methods Explanation;Demonstration;Tactile cues;Verbal cues   ? Comprehension Verbalized understanding;Returned demonstration;Verbal cues required;Tactile cues required;Need further instruction   ? ?  ?  ? ?  ? ? ? PT Short Term Goals - 09/03/21 1151   ? ?  ? PT SHORT TERM GOAL #1  ? Title Pt will be independent with initial HEP in order to improve strength and balance in order to decrease fall risk and improve function at home and work.   ? Baseline 09/02/2021= No formal HEP in place   ? Time 6   ? Period Weeks   ? Status New   ? Target Date 10/14/21   ? ?  ?  ? ?  ? ? ? ? PT Long Term Goals - 09/03/21 1152   ? ?  ? PT LONG TERM GOAL #1  ? Title Pt will be independent with final HEP in order to improve strength and balance in order to decrease fall risk and improve function at home and work.   ? Baseline 09/02/2021= No formal HEP in place   ? Time 12   ? Period Weeks   ? Status New   ? Target Date 11/25/21   ?  ? PT LONG TERM GOAL #2  ? Title Pt will improve FOTO to target score of 53 to display perceived improvements in ability to complete ADL's   ? Baseline 09/02/2021= 50   ? Time 12   ? Period Weeks   ? Status New   ? Target Date 11/25/21   ?  ? PT LONG TERM GOAL #3  ? Title Pt will improve  BERG by at least 5 points in order to demonstrate clinically significant improvement in balance.   ? Baseline 09/02/2021= 42/56   ? Time 12   ? Period Weeks   ?  Status New   ? Target Date 11/25/21   ?  ? PT LONG TERM GOAL #4  ? Title Pt will decrease 5TSTS by at least 6 seconds in order to demonstrate clinically significant improvement in LE strength.   ? Baseline 09/02/2021= 22.63 sec without UE support   ? Time 12   ? Period Weeks   ? Status New   ? Target Date 11/25/21   ?  ? PT LONG TERM GOAL #5  ? Title Pt will decrease TUG to below 14 seconds/decrease in order to demonstrate decreased fall risk.   ? Baseline 09/02/2021= 17.9 sec without an AD   ? Time 12   ? Period Weeks   ? Status New   ? Target Date 11/25/21   ?  ? Additional Long Term Goals  ? Additional Long Term Goals Yes   ?  ? PT LONG TERM GOAL #6  ? Title Pt will increase 10MWT by at least 0.2 m/s in order to demonstrate clinically significant improvement in community ambulation   ? Baseline 09/02/2021=0.56 m/s without an AD   ? Time 12   ? Period Weeks   ? Status New   ? Target Date 11/25/21   ? ?  ?  ? ?  ? ? ? ? ? ? ? ? Plan - 09/28/21 1116   ? ? Clinical Impression Statement Patient presents with good motivation today. She was monitored including her SPO2 /HR and maintain > 95% with activities. She was able to increase resistance today and performed well with minimal rest break. She did endorse mostly fatigue at end of session but denied any shortness of breath or pain. Patient would benefit from additional skilled PT Intervention to improve strength, balance and mobility.   ? Personal Factors and Comorbidities Comorbidity 2   ? Comorbidities HTN, OA   ? Examination-Activity Limitations Bend;Caring for Others;Carry;Lift;Squat;Stairs;Transfers;Stand   ? Examination-Participation Restrictions Community Activity;Shop;Yard Work   ? Stability/Clinical Decision Making Evolving/Moderate complexity   ? Rehab Potential Good   ? PT Frequency 2x / week   ? PT  Duration 12 weeks   ? PT Treatment/Interventions ADLs/Self Care Home Management;Canalith Repostioning;Cryotherapy;Iontophoresis '4mg'$ /ml Dexamethasone;Moist Heat;Gait training;Stair training;Functional mobili

## 2021-09-30 ENCOUNTER — Ambulatory Visit: Payer: Medicare Other

## 2021-09-30 DIAGNOSIS — R2681 Unsteadiness on feet: Secondary | ICD-10-CM | POA: Diagnosis not present

## 2021-09-30 DIAGNOSIS — M6281 Muscle weakness (generalized): Secondary | ICD-10-CM

## 2021-09-30 NOTE — Therapy (Signed)
[place] [facility] [address] [place], [place], [address] Phone: [phone]   Fax:  [phone]  Physical Therapy Treatment  Patient Details  Name: Tracie Fisher MRN: [id number] Date of Birth: Nov 14, 1935 Referring Provider (PT): Gurney Maxin   Encounter Date: 09/30/2021   PT End of Session - 09/30/21 1714     Visit Number 9    Number of Visits 25    Date for PT Re-Evaluation 11/25/21    Authorization Time Period 09/02/2021- 11/25/2021    Progress Note Due on Visit 10    PT Start Time 1151    PT Stop Time 1230    PT Time Calculation (min) 39 min    Equipment Utilized During Treatment Gait belt    Activity Tolerance Patient tolerated treatment well    Behavior During Therapy WFL for tasks assessed/performed             Past Medical History:  Diagnosis Date   Cancer ([place])    SKIN   Complication of anesthesia    Depression    Dysrhythmia    GERD (gastroesophageal reflux disease)    Hiatal hernia    HOH (hard of hearing)    Hypertension    Hypothyroidism    PONV (postoperative nausea and vomiting)    Thyroid disease     Past Surgical History:  Procedure Laterality Date   APPENDECTOMY     BACK SURGERY     MULTIPLE/ROD IN BACK   BREAST CYST ASPIRATION Left "years ago"   CATARACT EXTRACTION W/PHACO Left 03/21/2017   Procedure: CATARACT EXTRACTION PHACO AND INTRAOCULAR LENS PLACEMENT ([place]);  Surgeon: Birder Robson, MD;  Location: [facility] ORS;  Service: Ophthalmology;  Laterality: Left;  [place] 00:48 AP% 17.8 CDE 8.57 Fluid pack lot # [id number]   CATARACT EXTRACTION W/PHACO Right 04/11/2017   Procedure: CATARACT EXTRACTION PHACO AND INTRAOCULAR LENS PLACEMENT (IOC);  Surgeon: Birder Robson, MD;  Location: [facility] ORS;  Service: Ophthalmology;  Laterality: Right;  [place] 01:09.5 AP% 15.8 CDE 11.04 Fluid Pack lot # [id number]   CHOLECYSTECTOMY     COLON SURGERY     BOWEL OBSTRUCTION   COLONOSCOPY WITH PROPOFOL N/A  04/25/2016   Procedure: COLONOSCOPY WITH PROPOFOL;  Surgeon: Jonathon Bellows, MD;  Location: [facility] ENDOSCOPY;  Service: Endoscopy;  Laterality: N/A;   ESOPHAGOGASTRODUODENOSCOPY (EGD) WITH PROPOFOL N/A 04/25/2016   Procedure: ESOPHAGOGASTRODUODENOSCOPY (EGD) WITH PROPOFOL;  Surgeon: Jonathon Bellows, MD;  Location: [facility] ENDOSCOPY;  Service: Endoscopy;  Laterality: N/A;   EXPLORATORY LAPAROTOMY  10/18/2011   blockage small intestines   GIVENS CAPSULE STUDY N/A 05/11/2016   Procedure: GIVENS CAPSULE STUDY;  Surgeon: Jonathon Bellows, MD;  Location: [facility] ENDOSCOPY;  Service: Endoscopy;  Laterality: N/A;    There were no vitals filed for this visit.   Subjective Assessment - 09/30/21 1153     Subjective Pt reports R thumb pain (has follow-up for it). Pt reports she is taking extra-strength Tylenol.    Pertinent History Patient presents with referral for polyneuropathy/unsteadiness on feet. She endorses past medical history significant for HTN, skin cancer, Neuropathy with bilateral foot pain, Chronic back pain, OA.    Limitations Lifting;Standing;Walking;House hold activities    How long can you sit comfortably? No difficulty    How long can you stand comfortably? <30 min    How long can you walk comfortably? <5 min    Patient Stated Goals I just want to walk better.    Currently in Pain? Yes  Pain Location --   thumb   Pain Orientation Right    Pain Onset More than a month ago            INTERVENTIONS:    Therapeutic Exercises:     4# weights donned each LE: Seated March 2 x12 reps each LE  Seated Knee Ext 2x12 reps each LE; Pt rates as medium    Standing with 4# ankle weight: -hip abduction x12 reps each -BLE heel raises x15 reps BLE - Standing step tap  onto 6" step laterally 15 reps each LE. Pt rates as medium.    Standing with 4# ankle weights: Forward/backward step over 1/2 bolster with 1 rail assist x15 reps  Side stepping over 1/2 bolster with 1 rail assist x15 reps each LE  without UE support Continues to require min VCS to increase step length for better foot clearance;    STS 2x10 with red therapy ball held in hands to decrease reliance on BUEs    Education provided throughout session via VC/TC and demonstration to facilitate movement at target joints and correct muscle activation for all testing and exercises performed.       PT Education - 09/30/21 1714     Education Details exercise technique, body mechanics    Person(s) Educated Patient    Methods Explanation;Demonstration;Verbal cues    Comprehension Verbal cues required;Verbalized understanding;Returned demonstration;Need further instruction              PT Short Term Goals - 09/03/21 1151       PT SHORT TERM GOAL #1   Title Pt will be independent with initial HEP in order to improve strength and balance in order to decrease fall risk and improve function at home and work.    Baseline 09/02/2021= No formal HEP in place    Time 6    Period Weeks    Status New    Target Date 10/14/21               PT Long Term Goals - 09/03/21 1152       PT LONG TERM GOAL #1   Title Pt will be independent with final HEP in order to improve strength and balance in order to decrease fall risk and improve function at home and work.    Baseline 09/02/2021= No formal HEP in place    Time 12    Period Weeks    Status New    Target Date 11/25/21      PT LONG TERM GOAL #2   Title Pt will improve FOTO to target score of 53 to display perceived improvements in ability to complete ADL's    Baseline 09/02/2021= 50    Time 12    Period Weeks    Status New    Target Date 11/25/21      PT LONG TERM GOAL #3   Title Pt will improve BERG by at least 5 points in order to demonstrate clinically significant improvement in balance.    Baseline 09/02/2021= 42/56    Time 12    Period Weeks    Status New    Target Date 11/25/21      PT LONG TERM GOAL #4   Title Pt will decrease 5TSTS by at least 6 seconds  in order to demonstrate clinically significant improvement in LE strength.    Baseline 09/02/2021= 22.63 sec without UE support    Time 12    Period Weeks    Status New  Target Date 11/25/21      PT LONG TERM GOAL #5   Title Pt will decrease TUG to below 14 seconds/decrease in order to demonstrate decreased fall risk.    Baseline 09/02/2021= 17.9 sec without an AD    Time 12    Period Weeks    Status New    Target Date 11/25/21      Additional Long Term Goals   Additional Long Term Goals Yes      PT LONG TERM GOAL #6   Title Pt will increase 10MWT by at least 0.2 m/s in order to demonstrate clinically significant improvement in community ambulation    Baseline 09/02/2021=0.56 m/s without an AD    Time 12    Period Weeks    Status New    Target Date 11/25/21                   Plan - 09/30/21 1720     Clinical Impression Statement Pt with excellent motivation to participate in session. She was able to advance level of resistance performed with all exercises performed last appointment. She rated interventions easy-medium. This indicates improved BLE strength. The pt will continue to benefit from further skilled PT to improve strength, balance and mobility.    Personal Factors and Comorbidities Comorbidity 2    Comorbidities HTN, OA    Examination-Activity Limitations Bend;Caring for Others;Carry;Lift;Squat;Stairs;Transfers;Stand    Examination-Participation Restrictions Community Activity;Shop;Yard Work    [employer] Evolving/Moderate complexity    Rehab Potential Good    PT Frequency 2x / week    PT Duration 12 weeks    PT Treatment/Interventions ADLs/Self Care Home Management;Canalith Repostioning;Cryotherapy;Iontophoresis '4mg'$ /ml Dexamethasone;Moist Heat;Gait training;Stair training;Functional mobility training;Therapeutic activities;Therapeutic exercise;Balance training;Neuromuscular re-education;Patient/family education;Manual techniques;Passive  range of motion;Dry needling;Splinting;Vestibular    PT Next Visit Plan Instruct in balance training and LE strengthening    PT Home Exercise Plan 09/14/2021=Access Code: [facility]  URL: [email or web address]; no updates    Consulted and Agree with Plan of Care Patient             Patient will benefit from skilled therapeutic intervention in order to improve the following deficits and impairments:  Abnormal gait, Decreased activity tolerance, Decreased balance, Decreased coordination, Decreased endurance, Decreased mobility, Decreased range of motion, Decreased strength, Difficulty walking, Hypomobility, Impaired flexibility  Visit Diagnosis: Muscle weakness (generalized)  Unsteadiness on feet     Problem List Patient Active Problem List   Diagnosis Date Noted   Swelling of limb 03/05/2019   Pain in limb 03/05/2019   History of stroke 03/04/2019   Polyneuropathy 07/31/2018   Bilateral foot pain 03/06/2018   Chronic bilateral low back pain 03/06/2018   Neck pain 03/06/2018   Neuropathy 02/06/2018   History of nonmelanoma skin cancer 09/27/2016   Essential hypertension 09/02/2016   Hiatal hernia    Stricture of esophagus    Absolute anemia    Iron deficiency anemia due to chronic blood loss 03/23/2016   Generalized abdominal pain 03/23/2016   History of tachycardia 07/29/2015   SOB (shortness of breath) on exertion 07/29/2015   BCC (basal cell carcinoma), eyelid 02/11/2013    Zollie Pee, PT 09/30/2021, 5:23 PM  [place] [facility] [place], [place], [address] Phone: [phone]   Fax:  [phone]  Name: Tracie Fisher MRN: [id number] Date of Birth: 1936-01-25

## 2021-10-05 ENCOUNTER — Ambulatory Visit: Payer: Medicare Other

## 2021-10-05 DIAGNOSIS — R262 Difficulty in walking, not elsewhere classified: Secondary | ICD-10-CM

## 2021-10-05 DIAGNOSIS — R269 Unspecified abnormalities of gait and mobility: Secondary | ICD-10-CM

## 2021-10-05 DIAGNOSIS — R278 Other lack of coordination: Secondary | ICD-10-CM

## 2021-10-05 DIAGNOSIS — R2681 Unsteadiness on feet: Secondary | ICD-10-CM | POA: Diagnosis not present

## 2021-10-05 DIAGNOSIS — M6281 Muscle weakness (generalized): Secondary | ICD-10-CM

## 2021-10-05 NOTE — Therapy (Signed)
[place] MAIN [facility] SERVICES [place], [place], [address] Phone: [phone]   Fax:  [phone]  Physical Therapy Treatment/Physical Therapy Progress Note   Dates of reporting period 09/02/2021    to   10/05/2021  Patient Details  Name: Tracie Fisher MRN: [id number] Date of Birth: 1936-04-17 Referring Provider (PT): Gurney Maxin   Encounter Date: 10/05/2021   PT End of Session - 10/05/21 0802     Visit Number 10    Number of Visits 25    Date for PT Re-Evaluation 11/25/21    Authorization Time Period 09/02/2021- 11/25/2021    Progress Note Due on Visit 20    PT Start Time 1105    PT Stop Time 1144    PT Time Calculation (min) 39 min    Equipment Utilized During Treatment Gait belt    Activity Tolerance Patient tolerated treatment well    Behavior During Therapy WFL for tasks assessed/performed             Past Medical History:  Diagnosis Date   Cancer ([place])    SKIN   Complication of anesthesia    Depression    Dysrhythmia    GERD (gastroesophageal reflux disease)    Hiatal hernia    HOH (hard of hearing)    Hypertension    Hypothyroidism    PONV (postoperative nausea and vomiting)    Thyroid disease     Past Surgical History:  Procedure Laterality Date   APPENDECTOMY     BACK SURGERY     MULTIPLE/ROD IN BACK   BREAST CYST ASPIRATION Left "years ago"   CATARACT EXTRACTION W/PHACO Left 03/21/2017   Procedure: CATARACT EXTRACTION PHACO AND INTRAOCULAR LENS PLACEMENT ([place]);  Surgeon: Birder Robson, MD;  Location: [facility];  Service: Ophthalmology;  Laterality: Left;  [place] 00:48 AP% 17.8 CDE 8.57 Fluid pack lot # [id number]   CATARACT EXTRACTION W/PHACO Right 04/11/2017   Procedure: CATARACT EXTRACTION PHACO AND INTRAOCULAR LENS PLACEMENT ([facility]);  Surgeon: Birder Robson, MD;  Location: [facility];  Service: Ophthalmology;  Laterality: Right;  [place] 01:09.5 AP% 15.8 CDE 11.04 Fluid Pack lot # [id number]    CHOLECYSTECTOMY     COLON SURGERY     BOWEL OBSTRUCTION   COLONOSCOPY WITH PROPOFOL N/A 04/25/2016   Procedure: COLONOSCOPY WITH PROPOFOL;  Surgeon: Jonathon Bellows, MD;  Location: [facility];  Service: Endoscopy;  Laterality: N/A;   ESOPHAGOGASTRODUODENOSCOPY (EGD) WITH PROPOFOL N/A 04/25/2016   Procedure: ESOPHAGOGASTRODUODENOSCOPY (EGD) WITH PROPOFOL;  Surgeon: Jonathon Bellows, MD;  Location: [facility];  Service: Endoscopy;  Laterality: N/A;   EXPLORATORY LAPAROTOMY  10/18/2011   blockage small intestines   GIVENS CAPSULE STUDY N/A 05/11/2016   Procedure: GIVENS CAPSULE STUDY;  Surgeon: Jonathon Bellows, MD;  Location: [facility];  Service: Endoscopy;  Laterality: N/A;    There were no vitals filed for this visit.   Subjective Assessment - 10/05/21 1114     Subjective Pt reports improved R thumb pain and states she has been walking more.    Pertinent History Patient presents with referral for polyneuropathy/unsteadiness on feet. She endorses past medical history significant for HTN, skin cancer, Neuropathy with bilateral foot pain, Chronic back pain, OA.    Limitations Lifting;Standing;Walking;House hold activities    How long can you sit comfortably? No difficulty    How long can you stand comfortably? <30 min    How long can you walk comfortably? <5 min    Patient Stated Goals I just  want to walk better.    Currently in Pain? No/denies    Pain Onset --              INTERVENTIONS:   Reassessed all goals for Progress note  [facility] PT Assessment - 10/05/21 1137       Berg Balance Test   Sit to Stand Able to stand without using hands and stabilize independently    Standing Unsupported Able to stand safely 2 minutes    Sitting with Back Unsupported but Feet Supported on Floor or Stool Able to sit safely and securely 2 minutes    Stand to Sit Sits safely with minimal use of hands    Transfers Able to transfer safely, minor use of hands    Standing Unsupported with Eyes Closed  Able to stand 10 seconds safely    Standing Unsupported with Feet Together Able to place feet together independently and stand 1 minute safely    From Standing, Reach Forward with Outstretched Arm Can reach forward >12 cm safely (5")    From Standing Position, Pick up Object from Floor Able to pick up shoe, needs supervision    From Standing Position, Turn to Look Behind Over each Shoulder Looks behind from both sides and weight shifts well    Turn 360 Degrees Able to turn 360 degrees safely but slowly    Standing Unsupported, Alternately Place Feet on Step/Stool Able to stand independently and safely and complete 8 steps in 20 seconds    Standing Unsupported, One Foot in Front Able to plae foot ahead of the other independently and hold 30 seconds    Standing on One Leg Tries to lift leg/unable to hold 3 seconds but remains standing independently    Total Score 48            INTERVENTIONS:   Reassessed goals for progress note:   HEP:  Patient reports walking with walker at home and performing sitting exercises. She reports no questions with today.   5x STS= 17.85 sec without UE support   TUG: 16.31 sec without an AD  10 MWT= 16.23 sec without an AD= 0.62 m/s   BERG= 48/56  FOTO= 62               PT Education - 10/06/21 0802     Education Details Exercise technique, body mechanics    Person(s) Educated Patient    Methods Explanation;Demonstration;Tactile cues;Verbal cues    Comprehension Verbalized understanding;Returned demonstration;Verbal cues required;Tactile cues required;Need further instruction              PT Short Term Goals - 10/05/21 1115       PT SHORT TERM GOAL #1   Title Pt will be independent with initial HEP in order to improve strength and balance in order to decrease fall risk and improve function at home and work.    Baseline 09/02/2021= No formal HEP in place; 10/05/2021- Patient reports walking with walker at home and performing  sitting exercises. She reports no questions with today.    Time 6    Period Weeks    Status Achieved    Target Date 10/14/21               PT Long Term Goals - 10/05/21 1118       PT LONG TERM GOAL #1   Title Pt will be independent with final HEP in order to improve strength and balance in order to decrease fall risk and improve function  at home and work.    Baseline 09/02/2021= No formal HEP in place    Time 12    Period Weeks    Status New    Target Date 11/25/21      PT LONG TERM GOAL #2   Title Pt will improve FOTO to target score of 53 to display perceived improvements in ability to complete ADL's    Baseline 09/02/2021= 50; 10/05/2021=62    Time 12    Period Weeks    Status On-going    Target Date 11/25/21      PT LONG TERM GOAL #3   Title Pt will improve BERG by at least 5 points in order to demonstrate clinically significant improvement in balance.    Baseline 09/02/2021= 42/56; 10/05/2021=48/56    Time 12    Period Weeks    Status On-going    Target Date 11/25/21      PT LONG TERM GOAL #4   Title Pt will decrease 5TSTS by at least 6 seconds in order to demonstrate clinically significant improvement in LE strength.    Baseline 09/02/2021= 22.63 sec without UE support; 10/05/2021= 17.85 sec without UE Support    Time 12    Period Weeks    Status On-going    Target Date 11/25/21      PT LONG TERM GOAL #5   Title Pt will decrease TUG to below 14 seconds/decrease in order to demonstrate decreased fall risk.    Baseline 09/02/2021= 17.9 sec without an AD; 10/05/2021= 16.31 sec without an AD    Time 12    Period Weeks    Status On-going    Target Date 11/25/21      PT LONG TERM GOAL #6   Title Pt will increase 10MWT by at least 0.2 m/s in order to demonstrate clinically significant improvement in community ambulation    Baseline 09/02/2021=0.56 m/s without an AD; 10/05/2021= 0.62 m/s    Time 12    Period Weeks    Status On-going    Target Date 11/25/21                    Plan - 10/05/21 0803     Clinical Impression Statement Patient presents today with good motivation for today's session. She was reassessed for her 10th visit today and able to demonstrate progress toward all goals. She demo improved overall functional strength and mobility as seen by improved 5x STS, TUG, 10 meter walk test and BERG. Patient's condition has the potential to improve in response to therapy. Maximum improvement is yet to be obtained. The anticipated improvement is attainable and reasonable in a generally predictable time.   The pt will continue to benefit from further skilled PT to improve strength, balance and mobility.    Personal Factors and Comorbidities Comorbidity 2    Comorbidities HTN, OA    Examination-Activity Limitations Bend;Caring for Others;Carry;Lift;Squat;Stairs;Transfers;Stand    Examination-Participation Restrictions Community Activity;Shop;Yard Work    [employer] officer Evolving/Moderate complexity    Rehab Potential Good    PT Frequency 2x / week    PT Duration 12 weeks    PT Treatment/Interventions ADLs/Self Care Home Management;Canalith Repostioning;Cryotherapy;Iontophoresis '4mg'$ /ml Dexamethasone;Moist Heat;Gait training;Stair training;Functional mobility training;Therapeutic activities;Therapeutic exercise;Balance training;Neuromuscular re-education;Patient/family education;Manual techniques;Passive range of motion;Dry needling;Splinting;Vestibular    PT Next Visit Plan Instruct in balance training and LE strengthening    PT Home Exercise Plan 09/14/2021=Access Code: [facility]  URL: [email or web address]; no updates    Consulted and Agree with Plan  of Care Patient             Patient will benefit from skilled therapeutic intervention in order to improve the following deficits and impairments:  Abnormal gait, Decreased activity tolerance, Decreased balance, Decreased coordination, Decreased endurance, Decreased  mobility, Decreased range of motion, Decreased strength, Difficulty walking, Hypomobility, Impaired flexibility  Visit Diagnosis: Muscle weakness (generalized)  Unsteadiness on feet  Difficulty in walking, not elsewhere classified  Abnormality of gait and mobility  Other lack of coordination     Problem List Patient Active Problem List   Diagnosis Date Noted   Swelling of limb 03/05/2019   Pain in limb 03/05/2019   History of stroke 03/04/2019   Polyneuropathy 07/31/2018   Bilateral foot pain 03/06/2018   Chronic bilateral low back pain 03/06/2018   Neck pain 03/06/2018   Neuropathy 02/06/2018   History of nonmelanoma skin cancer 09/27/2016   Essential hypertension 09/02/2016   Hiatal hernia    Stricture of esophagus    Absolute anemia    Iron deficiency anemia due to chronic blood loss 03/23/2016   Generalized abdominal pain 03/23/2016   History of tachycardia 07/29/2015   SOB (shortness of breath) on exertion 07/29/2015   BCC (basal cell carcinoma), eyelid 02/11/2013    Lewis Moccasin, PT 10/06/2021, 8:27 AM  [place] [address] [place], [place], [address] Phone: [phone]   Fax:  [phone]  Name: KAISA WOFFORD MRN: [id number] Date of Birth: 1935/11/09

## 2021-10-07 ENCOUNTER — Ambulatory Visit: Payer: Medicare Other

## 2021-10-07 DIAGNOSIS — R262 Difficulty in walking, not elsewhere classified: Secondary | ICD-10-CM

## 2021-10-07 DIAGNOSIS — R269 Unspecified abnormalities of gait and mobility: Secondary | ICD-10-CM

## 2021-10-07 DIAGNOSIS — M6281 Muscle weakness (generalized): Secondary | ICD-10-CM

## 2021-10-07 DIAGNOSIS — R278 Other lack of coordination: Secondary | ICD-10-CM

## 2021-10-07 DIAGNOSIS — R2681 Unsteadiness on feet: Secondary | ICD-10-CM

## 2021-10-07 NOTE — Therapy (Signed)
[place] MAIN [facility] SERVICES [address] [place], [place], [address] Phone: [phone]   Fax:  [phone]  Physical Therapy Treatment  Patient Details  Name: Tracie Fisher MRN: [id number] Date of Birth: 05-08-1936 Referring Provider (PT): Gurney Maxin   Encounter Date: 10/07/2021   PT End of Session - 10/07/21 1428     Visit Number 11    Number of Visits 25    Date for PT Re-Evaluation 11/25/21    Authorization Time Period 09/02/2021- 11/25/2021    Progress Note Due on Visit 20    PT Start Time 1430    PT Stop Time 1513    PT Time Calculation (min) 43 min    Equipment Utilized During Treatment Gait belt    Activity Tolerance Patient tolerated treatment well    Behavior During Therapy WFL for tasks assessed/performed             Past Medical History:  Diagnosis Date   Cancer ([place])    SKIN   Complication of anesthesia    Depression    Dysrhythmia    GERD (gastroesophageal reflux disease)    Hiatal hernia    HOH (hard of hearing)    Hypertension    Hypothyroidism    PONV (postoperative nausea and vomiting)    Thyroid disease     Past Surgical History:  Procedure Laterality Date   APPENDECTOMY     BACK SURGERY     MULTIPLE/ROD IN BACK   BREAST CYST ASPIRATION Left "years ago"   CATARACT EXTRACTION W/PHACO Left 03/21/2017   Procedure: CATARACT EXTRACTION PHACO AND INTRAOCULAR LENS PLACEMENT ([place]);  Surgeon: Birder Robson, MD;  Location: [facility];  Service: Ophthalmology;  Laterality: Left;  [place] 00:48 AP% 17.8 CDE 8.57 Fluid pack lot # [id number]   CATARACT EXTRACTION W/PHACO Right 04/11/2017   Procedure: CATARACT EXTRACTION PHACO AND INTRAOCULAR LENS PLACEMENT ([facility]);  Surgeon: Birder Robson, MD;  Location: [facility];  Service: Ophthalmology;  Laterality: Right;  [place] 01:09.5 AP% 15.8 CDE 11.04 Fluid Pack lot # [id number]   CHOLECYSTECTOMY     COLON SURGERY     BOWEL OBSTRUCTION   COLONOSCOPY WITH PROPOFOL N/A  04/25/2016   Procedure: COLONOSCOPY WITH PROPOFOL;  Surgeon: Jonathon Bellows, MD;  Location: [facility];  Service: Endoscopy;  Laterality: N/A;   ESOPHAGOGASTRODUODENOSCOPY (EGD) WITH PROPOFOL N/A 04/25/2016   Procedure: ESOPHAGOGASTRODUODENOSCOPY (EGD) WITH PROPOFOL;  Surgeon: Jonathon Bellows, MD;  Location: [facility];  Service: Endoscopy;  Laterality: N/A;   EXPLORATORY LAPAROTOMY  10/18/2011   blockage small intestines   GIVENS CAPSULE STUDY N/A 05/11/2016   Procedure: GIVENS CAPSULE STUDY;  Surgeon: Jonathon Bellows, MD;  Location: [facility];  Service: Endoscopy;  Laterality: N/A;    There were no vitals filed for this visit.   Subjective Assessment - 10/07/21 1428     Subjective Patient reports continuing to feel well without significant issues- States thumb is better.    Pertinent History Patient presents with referral for polyneuropathy/unsteadiness on feet. She endorses past medical history significant for HTN, skin cancer, Neuropathy with bilateral foot pain, Chronic back pain, OA.    Limitations Lifting;Standing;Walking;House hold activities    How long can you sit comfortably? No difficulty    How long can you stand comfortably? <30 min    How long can you walk comfortably? <5 min    Patient Stated Goals I just want to walk better.    Currently in Pain? No/denies    Pain Score 3  INTERVENTIONS:    Therapeutic Exercises:   Standing step ups B LE  onto 6" block x 15 reps alt LE (BUE Support)   Standing hip flex 3lb AW alt LE x 15 reps Standing Hip abd 3lb AW alt LE x 15 reps Standing hip ext 3lb AW alt LE x 15 reps  Standing knee flex 3lb AW alt LE x 15 reps Standing calf raises 3lb AW BLE x  15 reps O2 sat = 98% and HR= 64 bpm Sit to stand without UE support x 15 reps Patient stopped due to fatigue.   Education provided throughout session via VC/TC and demonstration to facilitate movement at target joints and correct muscle activation for all  testing and exercises performed.                         PT Education - 10/07/21 1428     Education Details Exercise technique    Person(s) Educated Patient    Methods Explanation;Demonstration;Tactile cues;Verbal cues    Comprehension Tactile cues required;Returned demonstration;Verbalized understanding;Verbal cues required;Need further instruction              PT Short Term Goals - 10/05/21 1115       PT SHORT TERM GOAL #1   Title Pt will be independent with initial HEP in order to improve strength and balance in order to decrease fall risk and improve function at home and work.    Baseline 09/02/2021= No formal HEP in place; 10/05/2021- Patient reports walking with walker at home and performing sitting exercises. She reports no questions with today.    Time 6    Period Weeks    Status Achieved    Target Date 10/14/21               PT Long Term Goals - 10/05/21 1118       PT LONG TERM GOAL #1   Title Pt will be independent with final HEP in order to improve strength and balance in order to decrease fall risk and improve function at home and work.    Baseline 09/02/2021= No formal HEP in place    Time 12    Period Weeks    Status New    Target Date 11/25/21      PT LONG TERM GOAL #2   Title Pt will improve FOTO to target score of 53 to display perceived improvements in ability to complete ADL's    Baseline 09/02/2021= 50; 10/05/2021=62    Time 12    Period Weeks    Status On-going    Target Date 11/25/21      PT LONG TERM GOAL #3   Title Pt will improve BERG by at least 5 points in order to demonstrate clinically significant improvement in balance.    Baseline 09/02/2021= 42/56; 10/05/2021=48/56    Time 12    Period Weeks    Status On-going    Target Date 11/25/21      PT LONG TERM GOAL #4   Title Pt will decrease 5TSTS by at least 6 seconds in order to demonstrate clinically significant improvement in LE strength.    Baseline 09/02/2021=  22.63 sec without UE support; 10/05/2021= 17.85 sec without UE Support    Time 12    Period Weeks    Status On-going    Target Date 11/25/21      PT LONG TERM GOAL #5   Title Pt will decrease TUG to below 14  seconds/decrease in order to demonstrate decreased fall risk.    Baseline 09/02/2021= 17.9 sec without an AD; 10/05/2021= 16.31 sec without an AD    Time 12    Period Weeks    Status On-going    Target Date 11/25/21      PT LONG TERM GOAL #6   Title Pt will increase 10MWT by at least 0.2 m/s in order to demonstrate clinically significant improvement in community ambulation    Baseline 09/02/2021=0.56 m/s without an AD; 10/05/2021= 0.62 m/s    Time 12    Period Weeks    Status On-going    Target Date 11/25/21                   Plan - 10/07/21 1429     Clinical Impression Statement Patient presents today with good motivation for session. She was able to progress in resistance today and performed well- able to return demonstration with minimal VC. She did endorse fatigue after each exercise and ultimately stopped. She denied any pain but reported just very tired. The pt will continue to benefit from further skilled PT to improve strength, balance and mobility    Personal Factors and Comorbidities Comorbidity 2    Comorbidities HTN, OA    Examination-Activity Limitations Bend;Caring for Others;Carry;Lift;Squat;Stairs;Transfers;Stand    Examination-Participation Restrictions Community Activity;Shop;Yard Work    [employer]chant [employer] officer Evolving/Moderate complexity    Rehab Potential Good    PT Frequency 2x / week    PT Duration 12 weeks    PT Treatment/Interventions ADLs/Self Care Home Management;Canalith Repostioning;Cryotherapy;Iontophoresis '4mg'$ /ml Dexamethasone;Moist Heat;Gait training;Stair training;Functional mobility training;Therapeutic activities;Therapeutic exercise;Balance training;Neuromuscular re-education;Patient/family education;Manual techniques;Passive  range of motion;Dry needling;Splinting;Vestibular    PT Next Visit Plan Instruct in balance training and LE strengthening    PT Home Exercise Plan 09/14/2021=Access Code: [facility]  URL: [email or web address]; no updates    Consulted and Agree with Plan of Care Patient             Patient will benefit from skilled therapeutic intervention in order to improve the following deficits and impairments:  Abnormal gait, Decreased activity tolerance, Decreased balance, Decreased coordination, Decreased endurance, Decreased mobility, Decreased range of motion, Decreased strength, Difficulty walking, Hypomobility, Impaired flexibility  Visit Diagnosis: Muscle weakness (generalized)  Unsteadiness on feet  Difficulty in walking, not elsewhere classified  Abnormality of gait and mobility  Other lack of coordination     Problem List Patient Active Problem List   Diagnosis Date Noted   Swelling of limb 03/05/2019   Pain in limb 03/05/2019   History of stroke 03/04/2019   Polyneuropathy 07/31/2018   Bilateral foot pain 03/06/2018   Chronic bilateral low back pain 03/06/2018   Neck pain 03/06/2018   Neuropathy 02/06/2018   History of nonmelanoma skin cancer 09/27/2016   Essential hypertension 09/02/2016   Hiatal hernia    Stricture of esophagus    Absolute anemia    Iron deficiency anemia due to chronic blood loss 03/23/2016   Generalized abdominal pain 03/23/2016   History of tachycardia 07/29/2015   SOB (shortness of breath) on exertion 07/29/2015   BCC (basal cell carcinoma), eyelid 02/11/2013    Lewis Moccasin, PT 10/08/2021, 10:20 AM  [place], [place], [address] Phone: [phone]   Fax:  [phone]  Name: Tracie Fisher MRN: [id number] Date of Birth: 26-Dec-1935

## 2021-10-12 ENCOUNTER — Encounter: Payer: Self-pay | Admitting: Physical Therapy

## 2021-10-12 ENCOUNTER — Ambulatory Visit: Payer: Medicare Other | Admitting: Physical Therapy

## 2021-10-12 DIAGNOSIS — R2681 Unsteadiness on feet: Secondary | ICD-10-CM

## 2021-10-12 DIAGNOSIS — R262 Difficulty in walking, not elsewhere classified: Secondary | ICD-10-CM

## 2021-10-12 DIAGNOSIS — R269 Unspecified abnormalities of gait and mobility: Secondary | ICD-10-CM

## 2021-10-12 DIAGNOSIS — R278 Other lack of coordination: Secondary | ICD-10-CM

## 2021-10-12 DIAGNOSIS — M6281 Muscle weakness (generalized): Secondary | ICD-10-CM

## 2021-10-12 NOTE — Therapy (Signed)
[place] MAIN [facility] SERVICES [address] [place], [place], [address] Phone: [phone]   Fax:  [phone]  Physical Therapy Treatment  Patient Details  Name: Tracie Fisher MRN: [id number] Date of Birth: 12/14/1935 Referring Provider (PT): Gurney Maxin   Encounter Date: 10/12/2021    Past Medical History:  Diagnosis Date  . Cancer ([place])    SKIN  . Complication of anesthesia   . Depression   . Dysrhythmia   . GERD (gastroesophageal reflux disease)   . Hiatal hernia   . HOH (hard of hearing)   . Hypertension   . Hypothyroidism   . PONV (postoperative nausea and vomiting)   . Thyroid disease     Past Surgical History:  Procedure Laterality Date  . APPENDECTOMY    . BACK SURGERY     MULTIPLE/ROD IN BACK  . BREAST CYST ASPIRATION Left "years ago"  . CATARACT EXTRACTION W/PHACO Left 03/21/2017   Procedure: CATARACT EXTRACTION PHACO AND INTRAOCULAR LENS PLACEMENT (IOC);  Surgeon: Birder Robson, MD;  Location: [facility] ORS;  Service: Ophthalmology;  Laterality: Left;  [place] 00:48 AP% 17.8 CDE 8.57 Fluid pack lot # [id number]  . CATARACT EXTRACTION W/PHACO Right 04/11/2017   Procedure: CATARACT EXTRACTION PHACO AND INTRAOCULAR LENS PLACEMENT ([place]);  Surgeon: Birder Robson, MD;  Location: [facility] ORS;  Service: Ophthalmology;  Laterality: Right;  [place] 01:09.5 AP% 15.8 CDE 11.04 Fluid Pack lot # [id number]  . CHOLECYSTECTOMY    . COLON SURGERY     BOWEL OBSTRUCTION  . COLONOSCOPY WITH PROPOFOL N/A 04/25/2016   Procedure: COLONOSCOPY WITH PROPOFOL;  Surgeon: Jonathon Bellows, MD;  Location: [facility] ENDOSCOPY;  Service: Endoscopy;  Laterality: N/A;  . ESOPHAGOGASTRODUODENOSCOPY (EGD) WITH PROPOFOL N/A 04/25/2016   Procedure: ESOPHAGOGASTRODUODENOSCOPY (EGD) WITH PROPOFOL;  Surgeon: Jonathon Bellows, MD;  Location: [facility] ENDOSCOPY;  Service: Endoscopy;  Laterality: N/A;  . EXPLORATORY LAPAROTOMY  10/18/2011   blockage small intestines  . GIVENS CAPSULE  STUDY N/A 05/11/2016   Procedure: GIVENS CAPSULE STUDY;  Surgeon: Jonathon Bellows, MD;  Location: [facility] ENDOSCOPY;  Service: Endoscopy;  Laterality: N/A;    There were no vitals filed for this visit.             INTERVENTIONS:     Therapeutic Exercises:    Pt ascend/descend 4 steps with BUE rail assist x3 sets with close supervision; able to negotiate with reciprocal pattern. Does report slight discomfort in left knee with increased repetition; She also reports increased fatigue;    Standing hip flex 3lb AW alt LE x 15 reps Standing Hip abd 3lb AW alt LE x 15 reps, with cues to keep foot oriented forward for better hip strengthening;  Standing hip ext 3lb AW alt LE x 15 reps  Standing knee flex 3lb AW alt LE x 15 reps Standing  heel/toe raises 3lb AW BLE x  15 reps O2 sat = 97% and HR= 67 bpm  Seated:  LAQ 3# ankle weight x10 reps each LE with ankle DF for increased flexibility;  Patient stopped due to fatigue.   Instructed patient in balance exercise: Standing on airex pad: -alternate toe tap to 6 inch step with 1 rail assist x10 reps each LE -standing one foot on airex, one foot on 6 inch step:  Unsupported 30 sec hold  Progressed to unsupported standing with head turns side/side x3 reps each foot on step; Patient required CGA for safety especially with reduced rail assist;    Education provided throughout session via VC/TC  and demonstration to facilitate movement at target joints and correct muscle activation for all testing and exercises performed.                       PT Short Term Goals - 10/05/21 1115       PT SHORT TERM GOAL #1   Title Pt will be independent with initial HEP in order to improve strength and balance in order to decrease fall risk and improve function at home and work.    Baseline 09/02/2021= No formal HEP in place; 10/05/2021- Patient reports walking with walker at home and performing sitting exercises. She reports no questions with  today.    Time 6    Period Weeks    Status Achieved    Target Date 10/14/21               PT Long Term Goals - 10/05/21 1118       PT LONG TERM GOAL #1   Title Pt will be independent with final HEP in order to improve strength and balance in order to decrease fall risk and improve function at home and work.    Baseline 09/02/2021= No formal HEP in place    Time 12    Period Weeks    Status New    Target Date 11/25/21      PT LONG TERM GOAL #2   Title Pt will improve FOTO to target score of 53 to display perceived improvements in ability to complete ADL's    Baseline 09/02/2021= 50; 10/05/2021=62    Time 12    Period Weeks    Status On-going    Target Date 11/25/21      PT LONG TERM GOAL #3   Title Pt will improve BERG by at least 5 points in order to demonstrate clinically significant improvement in balance.    Baseline 09/02/2021= 42/56; 10/05/2021=48/56    Time 12    Period Weeks    Status On-going    Target Date 11/25/21      PT LONG TERM GOAL #4   Title Pt will decrease 5TSTS by at least 6 seconds in order to demonstrate clinically significant improvement in LE strength.    Baseline 09/02/2021= 22.63 sec without UE support; 10/05/2021= 17.85 sec without UE Support    Time 12    Period Weeks    Status On-going    Target Date 11/25/21      PT LONG TERM GOAL #5   Title Pt will decrease TUG to below 14 seconds/decrease in order to demonstrate decreased fall risk.    Baseline 09/02/2021= 17.9 sec without an AD; 10/05/2021= 16.31 sec without an AD    Time 12    Period Weeks    Status On-going    Target Date 11/25/21      PT LONG TERM GOAL #6   Title Pt will increase 10MWT by at least 0.2 m/s in order to demonstrate clinically significant improvement in community ambulation    Baseline 09/02/2021=0.56 m/s without an AD; 10/05/2021= 0.62 m/s    Time 12    Period Weeks    Status On-going    Target Date 11/25/21                    Patient will benefit from  skilled therapeutic intervention in order to improve the following deficits and impairments:     Visit Diagnosis: No diagnosis found.     Problem List Patient  Active Problem List   Diagnosis Date Noted  . Swelling of limb 03/05/2019  . Pain in limb 03/05/2019  . History of stroke 03/04/2019  . Polyneuropathy 07/31/2018  . Bilateral foot pain 03/06/2018  . Chronic bilateral low back pain 03/06/2018  . Neck pain 03/06/2018  . Neuropathy 02/06/2018  . History of nonmelanoma skin cancer 09/27/2016  . Essential hypertension 09/02/2016  . Hiatal hernia   . Stricture of esophagus   . Absolute anemia   . Iron deficiency anemia due to chronic blood loss 03/23/2016  . Generalized abdominal pain 03/23/2016  . History of tachycardia 07/29/2015  . SOB (shortness of breath) on exertion 07/29/2015  . BCC (basal cell carcinoma), eyelid 02/11/2013    Haydon Kalmar, PT 10/12/2021, 11:01 AM  [place] MAIN [facility] SERVICES [place], [place], [address] Phone: [phone]   Fax:  [phone]  Name: Tracie Fisher MRN: [id number] Date of Birth: 04-11-1936

## 2021-10-14 ENCOUNTER — Encounter: Payer: Self-pay | Admitting: Physical Therapy

## 2021-10-14 ENCOUNTER — Ambulatory Visit: Payer: Medicare Other | Attending: Neurology | Admitting: Physical Therapy

## 2021-10-14 DIAGNOSIS — M6281 Muscle weakness (generalized): Secondary | ICD-10-CM | POA: Diagnosis present

## 2021-10-14 DIAGNOSIS — R269 Unspecified abnormalities of gait and mobility: Secondary | ICD-10-CM | POA: Insufficient documentation

## 2021-10-14 DIAGNOSIS — R278 Other lack of coordination: Secondary | ICD-10-CM | POA: Insufficient documentation

## 2021-10-14 DIAGNOSIS — R2681 Unsteadiness on feet: Secondary | ICD-10-CM | POA: Diagnosis present

## 2021-10-14 DIAGNOSIS — R262 Difficulty in walking, not elsewhere classified: Secondary | ICD-10-CM | POA: Diagnosis present

## 2021-10-14 NOTE — Therapy (Signed)
[place] MAIN [facility] SERVICES [address] [place], [place], [address] Phone: [phone]   Fax:  [phone]  Physical Therapy Treatment  Patient Details  Name: Tracie Fisher MRN: [id number] Date of Birth: [birth year] Referring Provider (PT): Gurney Maxin   Encounter Date: 10/14/2021   PT End of Session - 10/14/21 1146     Visit Number 13    Number of Visits 25    Date for PT Re-Evaluation 11/25/21    Authorization Time Period 09/02/2021- 11/25/2021    Progress Note Due on Visit 20    PT Start Time 1146    PT Stop Time 1228    PT Time Calculation (min) 42 min    Equipment Utilized During Treatment Gait belt    Activity Tolerance Patient tolerated treatment well    Behavior During Therapy WFL for tasks assessed/performed             Past Medical History:  Diagnosis Date   Cancer ([place])    SKIN   Complication of anesthesia    Depression    Dysrhythmia    GERD (gastroesophageal reflux disease)    Hiatal hernia    HOH (hard of hearing)    Hypertension    Hypothyroidism    PONV (postoperative nausea and vomiting)    Thyroid disease     Past Surgical History:  Procedure Laterality Date   APPENDECTOMY     BACK SURGERY     MULTIPLE/ROD IN BACK   BREAST CYST ASPIRATION Left "years ago"   CATARACT EXTRACTION W/PHACO Left 03/21/2017   Procedure: CATARACT EXTRACTION PHACO AND INTRAOCULAR LENS PLACEMENT ([place]);  Surgeon: Birder Robson, MD;  Location: [facility] ORS;  Service: Ophthalmology;  Laterality: Left;  [place] 00:48 AP% 17.8 CDE 8.57 Fluid pack lot # [id number]   CATARACT EXTRACTION W/PHACO Right 04/11/2017   Procedure: CATARACT EXTRACTION PHACO AND INTRAOCULAR LENS PLACEMENT (IOC);  Surgeon: Birder Robson, MD;  Location: [facility] ORS;  Service: Ophthalmology;  Laterality: Right;  [place] 01:09.5 AP% 15.8 CDE 11.04 Fluid Pack lot # [id number]   CHOLECYSTECTOMY     COLON SURGERY     BOWEL OBSTRUCTION   COLONOSCOPY WITH PROPOFOL N/A  04/25/2016   Procedure: COLONOSCOPY WITH PROPOFOL;  Surgeon: Jonathon Bellows, MD;  Location: [facility] ENDOSCOPY;  Service: Endoscopy;  Laterality: N/A;   ESOPHAGOGASTRODUODENOSCOPY (EGD) WITH PROPOFOL N/A 04/25/2016   Procedure: ESOPHAGOGASTRODUODENOSCOPY (EGD) WITH PROPOFOL;  Surgeon: Jonathon Bellows, MD;  Location: [facility] ENDOSCOPY;  Service: Endoscopy;  Laterality: N/A;   EXPLORATORY LAPAROTOMY  10/18/2011   blockage small intestines   GIVENS CAPSULE STUDY N/A 05/11/2016   Procedure: GIVENS CAPSULE STUDY;  Surgeon: Jonathon Bellows, MD;  Location: [facility] ENDOSCOPY;  Service: Endoscopy;  Laterality: N/A;    There were no vitals filed for this visit.   Subjective Assessment - 10/14/21 1150     Subjective Patient reports her right hand was hurting a good bit yesterday. She is scheduled to see MD 6/15 for more shots.    Pertinent History Patient presents with referral for polyneuropathy/unsteadiness on feet. She endorses past medical history significant for HTN, skin cancer, Neuropathy with bilateral foot pain, Chronic back pain, OA.    Limitations Lifting;Standing;Walking;House hold activities    How long can you sit comfortably? No difficulty    How long can you stand comfortably? <30 min    How long can you walk comfortably? <5 min    Patient Stated Goals I just want to walk better.    Currently in  Pain? Yes    Pain Location Hand    Pain Orientation Right                     INTERVENTIONS:  PT educated patient about hand therapy and how that could possibly help with her thumb/hand pain. Patient will reach out to physician to get a referral if she is interested;     Instructed patient in balance exercise: Standing on airex pad: -alternate toe tap to 6 inch step with 1 rail assist x10 reps each LE -standing one foot on airex, one foot on 6 inch step:             Unsupported 30 sec hold             Progressed to unsupported standing with head turns side/side x5 reps each foot on step; Patient  required CGA for safety especially with reduced rail assist;   Tandem stance on airex beam, 10 sec hold x2 reps each foot in front with 1-0 rail assist; Required min A for safety; Side stepping on airex beam x5 laps each direction without rail assist, CGA for safety;  Gait in hallway: Forward walking x50 feet with head turns side/side x2 sets Backward walking x40 feet, short shuffled steps with narrow base of support Forward walking with high knee march to challenge stance control x40 feet with mod difficulty;      Education provided throughout session via VC/TC and demonstration to facilitate movement at target joints and correct muscle activation for all testing and exercises performed.    Pt does report fatigue at end of session;                          PT Education - 10/14/21 1146     Education Details exercise technique    Person(s) Educated Patient    Methods Explanation;Verbal cues    Comprehension Verbalized understanding;Returned demonstration;Verbal cues required;Need further instruction              PT Short Term Goals - 10/05/21 1115       PT SHORT TERM GOAL #1   Title Pt will be independent with initial HEP in order to improve strength and balance in order to decrease fall risk and improve function at home and work.    Baseline 09/02/2021= No formal HEP in place; 10/05/2021- Patient reports walking with walker at home and performing sitting exercises. She reports no questions with today.    Time 6    Period Weeks    Status Achieved    Target Date 10/14/21               PT Long Term Goals - 10/05/21 1118       PT LONG TERM GOAL #1   Title Pt will be independent with final HEP in order to improve strength and balance in order to decrease fall risk and improve function at home and work.    Baseline 09/02/2021= No formal HEP in place    Time 12    Period Weeks    Status New    Target Date 11/25/21      PT LONG TERM GOAL #2   Title  Pt will improve FOTO to target score of 53 to display perceived improvements in ability to complete ADL's    Baseline 09/02/2021= 50; 10/05/2021=62    Time 12    Period Weeks    Status On-going  Target Date 11/25/21      PT LONG TERM GOAL #3   Title Pt will improve BERG by at least 5 points in order to demonstrate clinically significant improvement in balance.    Baseline 09/02/2021= 42/56; 10/05/2021=48/56    Time 12    Period Weeks    Status On-going    Target Date 11/25/21      PT LONG TERM GOAL #4   Title Pt will decrease 5TSTS by at least 6 seconds in order to demonstrate clinically significant improvement in LE strength.    Baseline 09/02/2021= 22.63 sec without UE support; 10/05/2021= 17.85 sec without UE Support    Time 12    Period Weeks    Status On-going    Target Date 11/25/21      PT LONG TERM GOAL #5   Title Pt will decrease TUG to below 14 seconds/decrease in order to demonstrate decreased fall risk.    Baseline 09/02/2021= 17.9 sec without an AD; 10/05/2021= 16.31 sec without an AD    Time 12    Period Weeks    Status On-going    Target Date 11/25/21      PT LONG TERM GOAL #6   Title Pt will increase 10MWT by at least 0.2 m/s in order to demonstrate clinically significant improvement in community ambulation    Baseline 09/02/2021=0.56 m/s without an AD; 10/05/2021= 0.62 m/s    Time 12    Period Weeks    Status On-going    Target Date 11/25/21                   Plan - 10/14/21 1256     Clinical Impression Statement Patient motivated and participated well within session. She was instructed in advanced balance exercise. Utilized compliant surface to challenge stance control. Patient able to progress with less rail assist. Patient instructed in dynamic balance with gait in hallway. She had significant difficulty with backward walking with short shuffled steps and narrow base of support. She required min A for safety with advanced exercise. Pt tolerated well but  does fatigue with prolonged ambulation. Patient would benefit from additional skilled PT intervention to improve strength, balance and mobility;    Personal Factors and Comorbidities Comorbidity 2    Comorbidities HTN, OA    Examination-Activity Limitations Bend;Caring for Others;Carry;Lift;Squat;Stairs;Transfers;Stand    Examination-Participation Restrictions Community Activity;Shop;Yard Work    [employer] Evolving/Moderate complexity    Rehab Potential Good    PT Frequency 2x / week    PT Duration 12 weeks    PT Treatment/Interventions ADLs/Self Care Home Management;Canalith Repostioning;Cryotherapy;Iontophoresis '4mg'$ /ml Dexamethasone;Moist Heat;Gait training;Stair training;Functional mobility training;Therapeutic activities;Therapeutic exercise;Balance training;Neuromuscular re-education;Patient/family education;Manual techniques;Passive range of motion;Dry needling;Splinting;Vestibular    PT Next Visit Plan Instruct in balance training and LE strengthening    PT Home Exercise Plan 09/14/2021=Access Code: [facility]  URL: [email or web address]; no updates    Consulted and Agree with Plan of Care Patient             Patient will benefit from skilled therapeutic intervention in order to improve the following deficits and impairments:  Abnormal gait, Decreased activity tolerance, Decreased balance, Decreased coordination, Decreased endurance, Decreased mobility, Decreased range of motion, Decreased strength, Difficulty walking, Hypomobility, Impaired flexibility  Visit Diagnosis: Muscle weakness (generalized)  Unsteadiness on feet  Difficulty in walking, not elsewhere classified  Abnormality of gait and mobility  Other lack of coordination     Problem List Patient Active Problem List   Diagnosis Date Noted  Swelling of limb 03/05/2019   Pain in limb 03/05/2019   History of stroke 03/04/2019   Polyneuropathy 07/31/2018   Bilateral foot pain  03/06/2018   Chronic bilateral low back pain 03/06/2018   Neck pain 03/06/2018   Neuropathy 02/06/2018   History of nonmelanoma skin cancer 09/27/2016   Essential hypertension 09/02/2016   Hiatal hernia    Stricture of esophagus    Absolute anemia    Iron deficiency anemia due to chronic blood loss 03/23/2016   Generalized abdominal pain 03/23/2016   History of tachycardia 07/29/2015   SOB (shortness of breath) on exertion 07/29/2015   BCC (basal cell carcinoma), eyelid 02/11/2013    Lynnell Fiumara, PT, DPT 10/14/2021, 12:59 PM  [place] MAIN [facility] SERVICES [address] [place], [place], [address] Phone: [phone]   Fax:  [phone]  Name: Tracie Fisher MRN: [id number] Date of Birth: 17-Oct-1935

## 2021-10-19 ENCOUNTER — Ambulatory Visit: Payer: Medicare Other | Admitting: Physical Therapy

## 2021-10-19 ENCOUNTER — Encounter: Payer: Self-pay | Admitting: Physical Therapy

## 2021-10-19 DIAGNOSIS — R2681 Unsteadiness on feet: Secondary | ICD-10-CM

## 2021-10-19 DIAGNOSIS — R278 Other lack of coordination: Secondary | ICD-10-CM

## 2021-10-19 DIAGNOSIS — M6281 Muscle weakness (generalized): Secondary | ICD-10-CM | POA: Diagnosis not present

## 2021-10-19 DIAGNOSIS — R262 Difficulty in walking, not elsewhere classified: Secondary | ICD-10-CM

## 2021-10-19 DIAGNOSIS — R269 Unspecified abnormalities of gait and mobility: Secondary | ICD-10-CM

## 2021-10-19 NOTE — Therapy (Signed)
[place] MAIN [facility] SERVICES [address] [place], [place], [address] Phone: [phone]   Fax:  [phone]  Physical Therapy Treatment  Patient Details  Name: Tracie Fisher MRN: [id number] Date of Birth: 1935/10/15 Referring Provider (PT): Gurney Maxin   Encounter Date: 10/19/2021    Past Medical History:  Diagnosis Date   Cancer [facility])    SKIN   Complication of anesthesia    Depression    Dysrhythmia    GERD (gastroesophageal reflux disease)    Hiatal hernia    HOH (hard of hearing)    Hypertension    Hypothyroidism    PONV (postoperative nausea and vomiting)    Thyroid disease     Past Surgical History:  Procedure Laterality Date   APPENDECTOMY     BACK SURGERY     MULTIPLE/ROD IN BACK   BREAST CYST ASPIRATION Left "years ago"   CATARACT EXTRACTION W/PHACO Left 03/21/2017   Procedure: CATARACT EXTRACTION PHACO AND INTRAOCULAR LENS PLACEMENT ([place]);  Surgeon: Birder Robson, MD;  Location: [facility] ORS;  Service: Ophthalmology;  Laterality: Left;  [place] 00:48 AP% 17.8 CDE 8.57 Fluid pack lot # [id number]   CATARACT EXTRACTION W/PHACO Right 04/11/2017   Procedure: CATARACT EXTRACTION PHACO AND INTRAOCULAR LENS PLACEMENT ([facility]);  Surgeon: Birder Robson, MD;  Location: [facility] ORS;  Service: Ophthalmology;  Laterality: Right;  [place] 01:09.5 AP% 15.8 CDE 11.04 Fluid Pack lot # [id number]   CHOLECYSTECTOMY     COLON SURGERY     BOWEL OBSTRUCTION   COLONOSCOPY WITH PROPOFOL N/A 04/25/2016   Procedure: COLONOSCOPY WITH PROPOFOL;  Surgeon: Jonathon Bellows, MD;  Location: [facility] ENDOSCOPY;  Service: Endoscopy;  Laterality: N/A;   ESOPHAGOGASTRODUODENOSCOPY (EGD) WITH PROPOFOL N/A 04/25/2016   Procedure: ESOPHAGOGASTRODUODENOSCOPY (EGD) WITH PROPOFOL;  Surgeon: Jonathon Bellows, MD;  Location: [facility] ENDOSCOPY;  Service: Endoscopy;  Laterality: N/A;   EXPLORATORY LAPAROTOMY  10/18/2011   blockage small intestines   GIVENS CAPSULE STUDY N/A 05/11/2016    Procedure: GIVENS CAPSULE STUDY;  Surgeon: Jonathon Bellows, MD;  Location: [facility] ENDOSCOPY;  Service: Endoscopy;  Laterality: N/A;    There were no vitals filed for this visit.   Subjective Assessment - 10/19/21 1438     Subjective Pt reports still having intermittent RUE thumb/hand pain. "I dread getting those shots in my hand." She reports not walking today with her walker. Denies any new falls/stumbles    Pertinent History Patient presents with referral for polyneuropathy/unsteadiness on feet. She endorses past medical history significant for HTN, skin cancer, Neuropathy with bilateral foot pain, Chronic back pain, OA.    Limitations Lifting;Standing;Walking;House hold activities    How long can you sit comfortably? No difficulty    How long can you stand comfortably? <30 min    How long can you walk comfortably? <5 min    Patient Stated Goals I just want to walk better.    Currently in Pain? Yes                      INTERVENTIONS:  Therapeutic Exercises:    Pt ascend/descend 4 steps with BUE rail assist x3 sets with close supervision; able to negotiate with reciprocal pattern. Does report slight discomfort in left knee with increased repetition; She also reports increased fatigue;    Standing hip flex 3lb AW alt LE x 15 reps Standing Hip abd 3lb AW alt LE x 15 reps, with cues to keep foot oriented forward for better hip strengthening;  Standing hip  ext 3lb AW alt LE x 15 reps  Standing knee flex 3lb AW alt LE x 15 reps Standing  heel/toe raises 3lb AW BLE x  15 reps O2 sat = 97% and HR= 67 bpm   Seated:  Hamstring curl green tband x10 reps each LE with moderate difficulty reported;      Instructed patient in balance exercise: Standing on airex pad: -alternate toe tap to 6 inch step with 0 rail assist x15 reps each LE -standing one foot on airex, one foot on 6 inch step:             Unsupported 30 sec hold                 Tandem stance on airex pad, modified  20 sec  hold x1 reps each foot in front with 1-0 rail assist;  Progressed to unsupported standing with head turns side/side x5 reps each foot on step; Patient required CGA for safety especially with reduced rail assist;    Gait in hallway: Forward walking with high knee march x50 feet x2 laps Backward walking x40 feet, short shuffled steps with narrow base of support    Gait on red mat: Forward/backward x5 laps unsupported Side stepping x5 laps each direction unsupported Required CGA for safety;       Education provided throughout session via VC/TC and demonstration to facilitate movement at target joints and correct muscle activation for all testing and exercises performed.    Pt does report fatigue at end of session;                                   PT Short Term Goals - 10/05/21 1115       PT SHORT TERM GOAL #1   Title Pt will be independent with initial HEP in order to improve strength and balance in order to decrease fall risk and improve function at home and work.    Baseline 09/02/2021= No formal HEP in place; 10/05/2021- Patient reports walking with walker at home and performing sitting exercises. She reports no questions with today.    Time 6    Period Weeks    Status Achieved    Target Date 10/14/21               PT Long Term Goals - 10/05/21 1118       PT LONG TERM GOAL #1   Title Pt will be independent with final HEP in order to improve strength and balance in order to decrease fall risk and improve function at home and work.    Baseline 09/02/2021= No formal HEP in place    Time 12    Period Weeks    Status New    Target Date 11/25/21      PT LONG TERM GOAL #2   Title Pt will improve FOTO to target score of 53 to display perceived improvements in ability to complete ADL's    Baseline 09/02/2021= 50; 10/05/2021=62    Time 12    Period Weeks    Status On-going    Target Date 11/25/21      PT LONG TERM GOAL #3   Title Pt will  improve BERG by at least 5 points in order to demonstrate clinically significant improvement in balance.    Baseline 09/02/2021= 42/56; 10/05/2021=48/56    Time 12    Period Weeks    Status On-going  Target Date 11/25/21      PT LONG TERM GOAL #4   Title Pt will decrease 5TSTS by at least 6 seconds in order to demonstrate clinically significant improvement in LE strength.    Baseline 09/02/2021= 22.63 sec without UE support; 10/05/2021= 17.85 sec without UE Support    Time 12    Period Weeks    Status On-going    Target Date 11/25/21      PT LONG TERM GOAL #5   Title Pt will decrease TUG to below 14 seconds/decrease in order to demonstrate decreased fall risk.    Baseline 09/02/2021= 17.9 sec without an AD; 10/05/2021= 16.31 sec without an AD    Time 12    Period Weeks    Status On-going    Target Date 11/25/21      PT LONG TERM GOAL #6   Title Pt will increase 10MWT by at least 0.2 m/s in order to demonstrate clinically significant improvement in community ambulation    Baseline 09/02/2021=0.56 m/s without an AD; 10/05/2021= 0.62 m/s    Time 12    Period Weeks    Status On-going    Target Date 11/25/21                    Patient will benefit from skilled therapeutic intervention in order to improve the following deficits and impairments:     Visit Diagnosis: No diagnosis found.     Problem List Patient Active Problem List   Diagnosis Date Noted   Swelling of limb 03/05/2019   Pain in limb 03/05/2019   History of stroke 03/04/2019   Polyneuropathy 07/31/2018   Bilateral foot pain 03/06/2018   Chronic bilateral low back pain 03/06/2018   Neck pain 03/06/2018   Neuropathy 02/06/2018   History of nonmelanoma skin cancer 09/27/2016   Essential hypertension 09/02/2016   Hiatal hernia    Stricture of esophagus    Absolute anemia    Iron deficiency anemia due to chronic blood loss 03/23/2016   Generalized abdominal pain 03/23/2016   History of tachycardia  07/29/2015   SOB (shortness of breath) on exertion 07/29/2015   BCC (basal cell carcinoma), eyelid 02/11/2013    Laydon Martis, PT 10/19/2021, 2:39 PM  [place], [place], [address] Phone: [phone]   Fax:  [phone]  Name: Tracie Fisher MRN: [id number] Date of Birth: [birth year]

## 2021-10-21 ENCOUNTER — Ambulatory Visit: Payer: Medicare Other

## 2021-10-21 DIAGNOSIS — R262 Difficulty in walking, not elsewhere classified: Secondary | ICD-10-CM

## 2021-10-21 DIAGNOSIS — M6281 Muscle weakness (generalized): Secondary | ICD-10-CM | POA: Diagnosis not present

## 2021-10-21 DIAGNOSIS — R269 Unspecified abnormalities of gait and mobility: Secondary | ICD-10-CM

## 2021-10-21 DIAGNOSIS — R2681 Unsteadiness on feet: Secondary | ICD-10-CM

## 2021-10-21 NOTE — Therapy (Signed)
[place] MAIN [facility] SERVICES [address] [place], [place], [address] Phone: [phone]   Fax:  [phone]  Physical Therapy Treatment  Patient Details  Name: Tracie Fisher MRN: [id number] Date of Birth: 06/21/1935 Referring Provider (PT): Gurney Maxin   Encounter Date: 10/21/2021   PT End of Session - 10/21/21 1541     Visit Number 15    Number of Visits 25    Date for PT Re-Evaluation 11/25/21    Authorization Time Period 09/02/2021- 11/25/2021    Progress Note Due on Visit 20    PT Start Time 1515    PT Stop Time 1600    PT Time Calculation (min) 45 min    Equipment Utilized During Treatment Gait belt    Activity Tolerance Patient tolerated treatment well    Behavior During Therapy WFL for tasks assessed/performed             Past Medical History:  Diagnosis Date   Cancer ([place])    SKIN   Complication of anesthesia    Depression    Dysrhythmia    GERD (gastroesophageal reflux disease)    Hiatal hernia    HOH (hard of hearing)    Hypertension    Hypothyroidism    PONV (postoperative nausea and vomiting)    Thyroid disease     Past Surgical History:  Procedure Laterality Date   APPENDECTOMY     BACK SURGERY     MULTIPLE/ROD IN BACK   BREAST CYST ASPIRATION Left "years ago"   CATARACT EXTRACTION W/PHACO Left 03/21/2017   Procedure: CATARACT EXTRACTION PHACO AND INTRAOCULAR LENS PLACEMENT ([place]);  Surgeon: Birder Robson, MD;  Location: [facility] ORS;  Service: Ophthalmology;  Laterality: Left;  [place] 00:48 AP% 17.8 CDE 8.57 Fluid pack lot # [id number]   CATARACT EXTRACTION W/PHACO Right 04/11/2017   Procedure: CATARACT EXTRACTION PHACO AND INTRAOCULAR LENS PLACEMENT ([facility]);  Surgeon: Birder Robson, MD;  Location: [facility] ORS;  Service: Ophthalmology;  Laterality: Right;  [place] 01:09.5 AP% 15.8 CDE 11.04 Fluid Pack lot # [id number]   CHOLECYSTECTOMY     COLON SURGERY     BOWEL OBSTRUCTION   COLONOSCOPY WITH PROPOFOL N/A  04/25/2016   Procedure: COLONOSCOPY WITH PROPOFOL;  Surgeon: Jonathon Bellows, MD;  Location: [facility] END[facility];  Service: Endoscopy;  Laterality: N/A;   ESOPHAGOGASTRODUODENOSCOPY (EGD) WITH PROPOFOL N/A 04/25/2016   Procedure: ESOPHAGOGASTRODUODENOSCOPY (EGD) WITH PROPOFOL;  Surgeon: Jonathon Bellows, MD;  Location: [facility] ENDOS[facility];  Service: Endoscopy;  Laterality: N/A;   EXPLORATORY LAPAROTOMY  10/18/2011   blockage small intestines   GIVENS CAPSULE STUDY N/A 05/11/2016   Procedure: GIVENS CAPSULE STUDY;  Surgeon: Jonathon Bellows, MD;  Location: [facility] ENDOSCOPY;  Service: Endoscopy;  Laterality: N/A;    There were no vitals filed for this visit.   Subjective Assessment - 10/21/21 1537     Subjective Patient still is having pain in her R hand. No falls or LOB since last session, walked down to PT without an AD    Pertinent History Patient presents with referral for polyneuropathy/unsteadiness on feet. She endorses past medical history significant for HTN, skin cancer, Neuropathy with bilateral foot pain, Chronic back pain, OA.    Limitations Lifting;Standing;Walking;House hold activities    How long can you sit comfortably? No difficulty    How long can you stand comfortably? <30 min    How long can you walk comfortably? <5 min    Patient Stated Goals I just want to walk better.  Currently in Pain? Yes    Pain Score 9     Pain Location Hand    Pain Orientation Right    Pain Descriptors / Indicators Aching    Pain Type Chronic pain    Pain Onset More than a month ago    Pain Frequency Intermittent             INTERVENTIONS:  Therapeutic Exercises:     standing 6" step up/down 10x each LE  Seated: -GTB hamstring curl 15x each LE  -Lateral step over hedgehog and back 10x each LE  Walk upstairs with CGA and cues for sequencing of foot placement 600 ft    NMR: Instructed patient in balance exercise: Standing on airex pad: -alternate toe tap to 6 inch step with 0 rail assist x15 reps each  LE -standing one foot on airex, one foot on 6 inch step Unsupported 30 sec hold x2 trials each LE               ambulate in hallway: -horizontal head turns with cues for reading alphabet from cards 86 ftx 2 sets -horizontal head turns with cues for reading number and symbols from cards 86 ft x 2 sets  -"red light/green light" for sudden initiation/termination of ambulation with close CGA for carryover to natural environment 2x 86 ft      Education provided throughout session via VC/TC and demonstration to facilitate movement at target joints and correct muscle activation for all testing and exercises performed.    Pt does report fatigue at end of session;     Patient tolerates progression of stabilization interventions with ambulation. She is challenged with dual task of reading letters while turning head getting confused between letters and numbers initially. Patient is highly motivated and eager for progression. She would benefit from additional skilled PT Intervention to improve strength, balance and mobility.                       PT Education - 10/21/21 1541     Education Details exercise technique, Arts development officer    Person(s) Educated Patient    Methods Explanation;Demonstration;Tactile cues;Verbal cues    Comprehension Verbalized understanding;Returned demonstration;Verbal cues required;Tactile cues required              PT Short Term Goals - 10/05/21 1115       PT SHORT TERM GOAL #1   Title Pt will be independent with initial HEP in order to improve strength and balance in order to decrease fall risk and improve function at home and work.    Baseline 09/02/2021= No formal HEP in place; 10/05/2021- Patient reports walking with walker at home and performing sitting exercises. She reports no questions with today.    Time 6    Period Weeks    Status Achieved    Target Date 10/14/21               PT Long Term Goals - 10/05/21 1118       PT LONG  TERM GOAL #1   Title Pt will be independent with final HEP in order to improve strength and balance in order to decrease fall risk and improve function at home and work.    Baseline 09/02/2021= No formal HEP in place    Time 12    Period Weeks    Status New    Target Date 11/25/21      PT LONG TERM GOAL #2   Title  Pt will improve FOTO to target score of 53 to display perceived improvements in ability to complete ADL's    Baseline 09/02/2021= 50; 10/05/2021=62    Time 12    Period Weeks    Status On-going    Target Date 11/25/21      PT LONG TERM GOAL #3   Title Pt will improve BERG by at least 5 points in order to demonstrate clinically significant improvement in balance.    Baseline 09/02/2021= 42/56; 10/05/2021=48/56    Time 12    Period Weeks    Status On-going    Target Date 11/25/21      PT LONG TERM GOAL #4   Title Pt will decrease 5TSTS by at least 6 seconds in order to demonstrate clinically significant improvement in LE strength.    Baseline 09/02/2021= 22.63 sec without UE support; 10/05/2021= 17.85 sec without UE Support    Time 12    Period Weeks    Status On-going    Target Date 11/25/21      PT LONG TERM GOAL #5   Title Pt will decrease TUG to below 14 seconds/decrease in order to demonstrate decreased fall risk.    Baseline 09/02/2021= 17.9 sec without an AD; 10/05/2021= 16.31 sec without an AD    Time 12    Period Weeks    Status On-going    Target Date 11/25/21      PT LONG TERM GOAL #6   Title Pt will increase 10MWT by at least 0.2 m/s in order to demonstrate clinically significant improvement in community ambulation    Baseline 09/02/2021=0.56 m/s without an AD; 10/05/2021= 0.62 m/s    Time 12    Period Weeks    Status On-going    Target Date 11/25/21                   Plan - 10/21/21 1548     Clinical Impression Statement Patient tolerates progression of stabilization interventions with ambulation. She is challenged with dual task of reading  letters while turning head getting confused between letters and numbers initially. Patient is highly motivated and eager for progression. She would benefit from additional skilled PT Intervention to improve strength, balance and mobility.    Personal Factors and Comorbidities Comorbidity 2    Comorbidities HTN, OA    Examination-Activity Limitations Bend;Caring for Others;Carry;Lift;Squat;Stairs;Transfers;Stand    Examination-Participation Restrictions Community Activity;Shop;Yard Work    Merchant [employer] officer Evolving/Moderate complexity    Rehab Potential Good    PT Frequency 2x / week    PT Duration 12 weeks    PT Treatment/Interventions ADLs/Self Care Home Management;Canalith Repostioning;Cryotherapy;Iontophoresis '4mg'$ /ml Dexamethasone;Moist Heat;Gait training;Stair training;Functional mobility training;Therapeutic activities;Therapeutic exercise;Balance training;Neuromuscular re-education;Patient/family education;Manual techniques;Passive range of motion;Dry needling;Splinting;Vestibular    PT Next Visit Plan Instruct in balance training and LE strengthening    PT Home Exercise Plan 09/14/2021=Access Code: [facility]  URL: [email or web address]; no updates    Consulted and Agree with Plan of Care Patient             Patient will benefit from skilled therapeutic intervention in order to improve the following deficits and impairments:  Abnormal gait, Decreased activity tolerance, Decreased balance, Decreased coordination, Decreased endurance, Decreased mobility, Decreased range of motion, Decreased strength, Difficulty walking, Hypomobility, Impaired flexibility  Visit Diagnosis: Muscle weakness (generalized)  Unsteadiness on feet  Difficulty in walking, not elsewhere classified  Abnormality of gait and mobility     Problem List Patient Active Problem List   Diagnosis Date  Noted   Swelling of limb 03/05/2019   Pain in limb 03/05/2019   History of stroke  03/04/2019   Polyneuropathy 07/31/2018   Bilateral foot pain 03/06/2018   Chronic bilateral low back pain 03/06/2018   Neck pain 03/06/2018   Neuropathy 02/06/2018   History of nonmelanoma skin cancer 09/27/2016   Essential hypertension 09/02/2016   Hiatal hernia    Stricture of esophagus    Absolute anemia    Iron deficiency anemia due to chronic blood loss 03/23/2016   Generalized abdominal pain 03/23/2016   History of tachycardia 07/29/2015   SOB (shortness of breath) on exertion 07/29/2015   BCC (basal cell carcinoma), eyelid 02/11/2013   Janna Arch, PT, DPT  10/21/2021, 5:04 PM  [place] MAIN [facility] SERVICES [place], [place], [address] Phone: [phone]   Fax:  [phone]  Name: Tracie Fisher MRN: [id number] Date of Birth: January 18, 1936

## 2021-10-25 ENCOUNTER — Ambulatory Visit: Payer: Medicare Other | Admitting: Physical Therapy

## 2021-10-26 ENCOUNTER — Ambulatory Visit: Payer: Medicare Other

## 2021-10-26 DIAGNOSIS — R2681 Unsteadiness on feet: Secondary | ICD-10-CM

## 2021-10-26 DIAGNOSIS — M6281 Muscle weakness (generalized): Secondary | ICD-10-CM

## 2021-10-26 NOTE — Therapy (Signed)
[place] MAIN [facility] SERVICES [address] [place], [place], [address] Phone: [phone]   Fax:  [phone]  Physical Therapy Treatment  Patient Details  Name: REEDA SOOHOO MRN: [id number] Date of Birth: 1936-03-18 Referring Provider (PT): Gurney Maxin   Encounter Date: 10/26/2021   PT End of Session - 10/26/21 1941     Visit Number 16    Number of Visits 25    Date for PT Re-Evaluation 11/25/21    Authorization Time Period 09/02/2021- 11/25/2021    Progress Note Due on Visit 20    PT Start Time 1349    PT Stop Time 1429    PT Time Calculation (min) 40 min    Equipment Utilized During Treatment Gait belt    Activity Tolerance Patient tolerated treatment well    Behavior During Therapy WFL for tasks assessed/performed             Past Medical History:  Diagnosis Date   Cancer ([place])    SKIN   Complication of anesthesia    Depression    Dysrhythmia    GERD (gastroesophageal reflux disease)    Hiatal hernia    HOH (hard of hearing)    Hypertension    Hypothyroidism    PONV (postoperative nausea and vomiting)    Thyroid disease     Past Surgical History:  Procedure Laterality Date   APPENDECTOMY     BACK SURGERY     MULTIPLE/ROD IN BACK   BREAST CYST ASPIRATION Left "years ago"   CATARACT EXTRACTION W/PHACO Left 03/21/2017   Procedure: CATARACT EXTRACTION PHACO AND INTRAOCULAR LENS PLACEMENT ([place]);  Surgeon: Birder Robson, MD;  Location: [facility];  Service: Ophthalmology;  Laterality: Left;  [place] 00:48 AP% 17.8 CDE 8.57 Fluid pack lot # [id number]   CATARACT EXTRACTION W/PHACO Right 04/11/2017   Procedure: CATARACT EXTRACTION PHACO AND INTRAOCULAR LENS PLACEMENT ([facility]);  Surgeon: Birder Robson, MD;  Location: [facility];  Service: Ophthalmology;  Laterality: Right;  [place] 01:09.5 AP% 15.8 CDE 11.04 Fluid Pack lot # [id number]   CHOLECYSTECTOMY     COLON SURGERY     BOWEL OBSTRUCTION   COLONOSCOPY WITH PROPOFOL N/A  04/25/2016   Procedure: COLONOSCOPY WITH PROPOFOL;  Surgeon: Jonathon Bellows, MD;  Location: [facility];  Service: Endoscopy;  Laterality: N/A;   ESOPHAGOGASTRODUODENOSCOPY (EGD) WITH PROPOFOL N/A 04/25/2016   Procedure: ESOPHAGOGASTRODUODENOSCOPY (EGD) WITH PROPOFOL;  Surgeon: Jonathon Bellows, MD;  Location: [facility];  Service: Endoscopy;  Laterality: N/A;   EXPLORATORY LAPAROTOMY  10/18/2011   blockage small intestines   GIVENS CAPSULE STUDY N/A 05/11/2016   Procedure: GIVENS CAPSULE STUDY;  Surgeon: Jonathon Bellows, MD;  Location: [facility];  Service: Endoscopy;  Laterality: N/A;    There were no vitals filed for this visit.   Subjective Assessment - 10/26/21 1349     Subjective Pt walked down to clinic today with no AD. She reports walking around the block with her walker over the weekend for exercise. Pt reports no stumbles/falls.  She says, "sometimes it's better and sometimes it ain't" regarding hand pain.    Pertinent History Patient presents with referral for polyneuropathy/unsteadiness on feet. She endorses past medical history significant for HTN, skin cancer, Neuropathy with bilateral foot pain, Chronic back pain, OA.    Limitations Lifting;Standing;Walking;House hold activities    How long can you sit comfortably? No difficulty    How long can you stand comfortably? <30 min    How long can you walk comfortably? <  5 min    Patient Stated Goals I just want to walk better.    Currently in Pain? No/denies    Pain Onset More than a month ago              INTERVENTIONS:   NMR: gait belt donned and cga provided throughout unless otherwise noted  ambulate in hallway: -horizontal head turns with cues for reading sticky notes 86 ftx 2 sets -Vertical head turns x 86 ft  -"red light/green light" for sudden initiation/termination of ambulation with close CGA 1x 86 ft   Step-length intervention over 10 meters: Trial 1: 21 steps Trial 2: 20 steps Trial 3: 21 steps  Airex: EO  30 sec EC 2x30 sec    Therapeutic Exercises:      Standing hamstring curls 2x20 each LE with UE support. Rates medium Alternating toe taps on 6" step 10x each LE 6" step up/down 10x each LE with UUE support   Seated: -Lateral step over hedgehog and back 15x each LE. Cuing for technique to prevent compensatory movements.     Pt educated throughout session about proper posture and technique with exercises. Improved exercise technique, movement at target joints, use of target muscles after min to mod verbal, visual, tactile cues.  Rationale for Evaluation and Treatment Rehabilitation    PT Education - 10/26/21 1940     Education Details exercise technique, body mechanics    Person(s) Educated Patient    Methods Explanation;Demonstration;Verbal cues    Comprehension Verbalized understanding;Returned demonstration;Need further instruction;Verbal cues required              PT Short Term Goals - 10/05/21 1115       PT SHORT TERM GOAL #1   Title Pt will be independent with initial HEP in order to improve strength and balance in order to decrease fall risk and improve function at home and work.    Baseline 09/02/2021= No formal HEP in place; 10/05/2021- Patient reports walking with walker at home and performing sitting exercises. She reports no questions with today.    Time 6    Period Weeks    Status Achieved    Target Date 10/14/21               PT Long Term Goals - 10/05/21 1118       PT LONG TERM GOAL #1   Title Pt will be independent with final HEP in order to improve strength and balance in order to decrease fall risk and improve function at home and work.    Baseline 09/02/2021= No formal HEP in place    Time 12    Period Weeks    Status New    Target Date 11/25/21      PT LONG TERM GOAL #2   Title Pt will improve FOTO to target score of 53 to display perceived improvements in ability to complete ADL's    Baseline 09/02/2021= 50; 10/05/2021=62    Time 12     Period Weeks    Status On-going    Target Date 11/25/21      PT LONG TERM GOAL #3   Title Pt will improve BERG by at least 5 points in order to demonstrate clinically significant improvement in balance.    Baseline 09/02/2021= 42/56; 10/05/2021=48/56    Time 12    Period Weeks    Status On-going    Target Date 11/25/21      PT LONG TERM GOAL #4   Title Pt  will decrease 5TSTS by at least 6 seconds in order to demonstrate clinically significant improvement in LE strength.    Baseline 09/02/2021= 22.63 sec without UE support; 10/05/2021= 17.85 sec without UE Support    Time 12    Period Weeks    Status On-going    Target Date 11/25/21      PT LONG TERM GOAL #5   Title Pt will decrease TUG to below 14 seconds/decrease in order to demonstrate decreased fall risk.    Baseline 09/02/2021= 17.9 sec without an AD; 10/05/2021= 16.31 sec without an AD    Time 12    Period Weeks    Status On-going    Target Date 11/25/21      PT LONG TERM GOAL #6   Title Pt will increase 10MWT by at least 0.2 m/s in order to demonstrate clinically significant improvement in community ambulation    Baseline 09/02/2021=0.56 m/s without an AD; 10/05/2021= 0.62 m/s    Time 12    Period Weeks    Status On-going    Target Date 11/25/21                   Plan - 10/26/21 1944     Clinical Impression Statement Pt highly motivated to participate in session. Pt demonstrates good carryover within session with step-length intervention following VC and external cues. While pt shows within session progress, she is still limited by fatigue, requiring frequent brief rest breaks. The pt will benefit from further skilled PT to improve strength, balance and mobility.    Personal Factors and Comorbidities Comorbidity 2    Comorbidities HTN, OA    Examination-Activity Limitations Bend;Caring for Others;Carry;Lift;Squat;Stairs;Transfers;Stand    Examination-Participation Restrictions Community Activity;Shop;Yard Work     [employer] officer Evolving/Moderate complexity    Rehab Potential Good    PT Frequency 2x / week    PT Duration 12 weeks    PT Treatment/Interventions ADLs/Self Care Home Management;Canalith Repostioning;Cryotherapy;Iontophoresis '4mg'$ /ml Dexamethasone;Moist Heat;Gait training;Stair training;Functional mobility training;Therapeutic activities;Therapeutic exercise;Balance training;Neuromuscular re-education;Patient/family education;Manual techniques;Passive range of motion;Dry needling;Splinting;Vestibular    PT Next Visit Plan Instruct in balance training and LE strengthening    PT Home Exercise Plan 09/14/2021=Access Code: [facility]  URL: [email or web address]; no updates    Consulted and Agree with Plan of Care Patient             Patient will benefit from skilled therapeutic intervention in order to improve the following deficits and impairments:  Abnormal gait, Decreased activity tolerance, Decreased balance, Decreased coordination, Decreased endurance, Decreased mobility, Decreased range of motion, Decreased strength, Difficulty walking, Hypomobility, Impaired flexibility  Visit Diagnosis: Unsteadiness on feet  Muscle weakness (generalized)     Problem List Patient Active Problem List   Diagnosis Date Noted   Swelling of limb 03/05/2019   Pain in limb 03/05/2019   History of stroke 03/04/2019   Polyneuropathy 07/31/2018   Bilateral foot pain 03/06/2018   Chronic bilateral low back pain 03/06/2018   Neck pain 03/06/2018   Neuropathy 02/06/2018   History of nonmelanoma skin cancer 09/27/2016   Essential hypertension 09/02/2016   Hiatal hernia    Stricture of esophagus    Absolute anemia    Iron deficiency anemia due to chronic blood loss 03/23/2016   Generalized abdominal pain 03/23/2016   History of tachycardia 07/29/2015   SOB (shortness of breath) on exertion 07/29/2015   BCC (basal cell carcinoma), eyelid 02/11/2013    Zollie Pee,  PT 10/26/2021, 7:48 PM  [place]  [facility] MAIN [facility] SERVICES [address], [place], [address] Phone: [phone]   Fax:  [phone]  Name: SYMIA HERDT MRN: [id number] Date of Birth: [birth year]

## 2021-10-27 ENCOUNTER — Encounter: Payer: Self-pay | Admitting: Physical Therapy

## 2021-10-27 ENCOUNTER — Ambulatory Visit: Payer: Medicare Other | Admitting: Physical Therapy

## 2021-10-27 DIAGNOSIS — R278 Other lack of coordination: Secondary | ICD-10-CM

## 2021-10-27 DIAGNOSIS — M6281 Muscle weakness (generalized): Secondary | ICD-10-CM

## 2021-10-27 DIAGNOSIS — R262 Difficulty in walking, not elsewhere classified: Secondary | ICD-10-CM

## 2021-10-27 DIAGNOSIS — R2681 Unsteadiness on feet: Secondary | ICD-10-CM

## 2021-10-27 DIAGNOSIS — R269 Unspecified abnormalities of gait and mobility: Secondary | ICD-10-CM

## 2021-10-27 NOTE — Therapy (Signed)
[place] MAIN [facility] SERVICES [address] [place], [place], [address] Phone: [phone]   Fax:  [phone]  Physical Therapy Treatment  Patient Details  Name: Tracie Fisher MRN: [id number] Date of Birth: [birth year] Referring Provider (PT): Gurney Maxin   Encounter Date: 10/27/2021   PT End of Session - 10/27/21 1056     Visit Number 17    Number of Visits 25    Date for PT Re-Evaluation 11/25/21    Authorization Time Period 09/02/2021- 11/25/2021    Progress Note Due on Visit 20    PT Start Time 1058    PT Stop Time 1141    PT Time Calculation (min) 43 min    Equipment Utilized During Treatment Gait belt    Activity Tolerance Patient tolerated treatment well    Behavior During Therapy WFL for tasks assessed/performed             Past Medical History:  Diagnosis Date   Cancer ([place])    SKIN   Complication of anesthesia    Depression    Dysrhythmia    GERD (gastroesophageal reflux disease)    Hiatal hernia    HOH (hard of hearing)    Hypertension    Hypothyroidism    PONV (postoperative nausea and vomiting)    Thyroid disease     Past Surgical History:  Procedure Laterality Date   APPENDECTOMY     BACK SURGERY     MULTIPLE/ROD IN BACK   BREAST CYST ASPIRATION Left "years ago"   CATARACT EXTRACTION W/PHACO Left 03/21/2017   Procedure: CATARACT EXTRACTION PHACO AND INTRAOCULAR LENS PLACEMENT ([place]);  Surgeon: Birder Robson, MD;  Location: [facility];  Service: Ophthalmology;  Laterality: Left;  [place] 00:48 AP% 17.8 CDE 8.57 Fluid pack lot # [id number]   CATARACT EXTRACTION W/PHACO Right 04/11/2017   Procedure: CATARACT EXTRACTION PHACO AND INTRAOCULAR LENS PLACEMENT ([facility]);  Surgeon: Birder Robson, MD;  Location: [facility];  Service: Ophthalmology;  Laterality: Right;  [place] 01:09.5 AP% 15.8 CDE 11.04 Fluid Pack lot # [id number]   CHOLECYSTECTOMY     COLON SURGERY     BOWEL OBSTRUCTION   COLONOSCOPY WITH PROPOFOL N/A  04/25/2016   Procedure: COLONOSCOPY WITH PROPOFOL;  Surgeon: Jonathon Bellows, MD;  Location: [facility];  Service: Endoscopy;  Laterality: N/A;   ESOPHAGOGASTRODUODENOSCOPY (EGD) WITH PROPOFOL N/A 04/25/2016   Procedure: ESOPHAGOGASTRODUODENOSCOPY (EGD) WITH PROPOFOL;  Surgeon: Jonathon Bellows, MD;  Location: [facility];  Service: Endoscopy;  Laterality: N/A;   EXPLORATORY LAPAROTOMY  10/18/2011   blockage small intestines   GIVENS CAPSULE STUDY N/A 05/11/2016   Procedure: GIVENS CAPSULE STUDY;  Surgeon: Jonathon Bellows, MD;  Location: [facility];  Service: Endoscopy;  Laterality: N/A;    There were no vitals filed for this visit.   Subjective Assessment - 10/27/21 1102     Subjective Pt reports rushing to get here today. She was having trouble with her TV and was running behind.    Pertinent History Patient presents with referral for polyneuropathy/unsteadiness on feet. She endorses past medical history significant for HTN, skin cancer, Neuropathy with bilateral foot pain, Chronic back pain, OA.    Limitations Lifting;Standing;Walking;House hold activities    How long can you sit comfortably? No difficulty    How long can you stand comfortably? <30 min    How long can you walk comfortably? <5 min    Patient Stated Goals I just want to walk better.    Currently in Pain? No/denies  Pain Onset More than a month ago                     INTERVENTIONS:  Therapeutic Exercises:     Seated:  LAQ x15 reps each LE     NMR: Instructed patient in dynamic balance with gait on different surfaces: Patient ambulated through hospital to cafeteria and outside to healing garden Instructed patient in forward gait on thick grass x50 feet x2 sets, requiring CGA to min A with short step length and slower gait speed Pt ambulated backward in thick grass x30 feet requiring min A with increased unsteadiness and slower speed; Pt ambulated outside on sidewalk with various inclines x200 feet with  close supervision. She does become short of breath with prolonged ambulation requiring short seated rest breaks.  Reinforced HEP Pt did walk back into hospital, during seated rest break, educated patient about referral for OT to assess thumb/wrist pain. She is going to see surgeon tomorrow, provided written referral for patient to take and have surgeon sign.Pt verbalized understanding;  Education provided throughout session via VC/TC and demonstration to facilitate movement at target joints and correct muscle activation for all testing and exercises performed.    Pt does report fatigue at end of session;      Rationale for Evaluation and Treatment Rehabilitation                 PT Education - 10/27/21 1056     Education Details exercise technique/positioning;    Person(s) Educated Patient    Methods Explanation;Verbal cues    Comprehension Verbalized understanding;Returned demonstration;Verbal cues required;Need further instruction              PT Short Term Goals - 10/05/21 1115       PT SHORT TERM GOAL #1   Title Pt will be independent with initial HEP in order to improve strength and balance in order to decrease fall risk and improve function at home and work.    Baseline 09/02/2021= No formal HEP in place; 10/05/2021- Patient reports walking with walker at home and performing sitting exercises. She reports no questions with today.    Time 6    Period Weeks    Status Achieved    Target Date 10/14/21               PT Long Term Goals - 10/05/21 1118       PT LONG TERM GOAL #1   Title Pt will be independent with final HEP in order to improve strength and balance in order to decrease fall risk and improve function at home and work.    Baseline 09/02/2021= No formal HEP in place    Time 12    Period Weeks    Status New    Target Date 11/25/21      PT LONG TERM GOAL #2   Title Pt will improve FOTO to target score of 53 to display perceived improvements  in ability to complete ADL's    Baseline 09/02/2021= 50; 10/05/2021=62    Time 12    Period Weeks    Status On-going    Target Date 11/25/21      PT LONG TERM GOAL #3   Title Pt will improve BERG by at least 5 points in order to demonstrate clinically significant improvement in balance.    Baseline 09/02/2021= 42/56; 10/05/2021=48/56    Time 12    Period Weeks    Status On-going    Target  Date 11/25/21      PT LONG TERM GOAL #4   Title Pt will decrease 5TSTS by at least 6 seconds in order to demonstrate clinically significant improvement in LE strength.    Baseline 09/02/2021= 22.63 sec without UE support; 10/05/2021= 17.85 sec without UE Support    Time 12    Period Weeks    Status On-going    Target Date 11/25/21      PT LONG TERM GOAL #5   Title Pt will decrease TUG to below 14 seconds/decrease in order to demonstrate decreased fall risk.    Baseline 09/02/2021= 17.9 sec without an AD; 10/05/2021= 16.31 sec without an AD    Time 12    Period Weeks    Status On-going    Target Date 11/25/21      PT LONG TERM GOAL #6   Title Pt will increase 10MWT by at least 0.2 m/s in order to demonstrate clinically significant improvement in community ambulation    Baseline 09/02/2021=0.56 m/s without an AD; 10/05/2021= 0.62 m/s    Time 12    Period Weeks    Status On-going    Target Date 11/25/21                   Plan - 10/28/21 3267     Clinical Impression Statement Patient motivated and participated well within session. She was instructed in dynamic balance walking outside on various surfaces to challenge balance/gait safety. Patient does exhibit shorter step length and slower gait speed when walking on thick grass. She also require min A when walking backwards. Patient does fatigue quickly with prolonged standing/walking with increased shortness of breath. She required short seated rest breaks. Patient would benefit from additional skiled PT Intervention to improve strength, balance  and mobility. She is going to see her surgeon tomorrow, recommend she follow up about order for OT referral to address thumb/wrist pain.    Personal Factors and Comorbidities Comorbidity 2    Comorbidities HTN, OA    Examination-Activity Limitations Bend;Caring for Others;Carry;Lift;Squat;Stairs;Transfers;Stand    Examination-Participation Restrictions Community Activity;Shop;Yard Work    [employer]chant [employer] officer Evolving/Moderate complexity    Rehab Potential Good    PT Frequency 2x / week    PT Duration 12 weeks    PT Treatment/Interventions ADLs/Self Care Home Management;Canalith Repostioning;Cryotherapy;Iontophoresis '4mg'$ /ml Dexamethasone;Moist Heat;Gait training;Stair training;Functional mobility training;Therapeutic activities;Therapeutic exercise;Balance training;Neuromuscular re-education;Patient/family education;Manual techniques;Passive range of motion;Dry needling;Splinting;Vestibular    PT Next Visit Plan Instruct in balance training and LE strengthening    PT Home Exercise Plan 09/14/2021=Access Code: [facility]  URL: [email or web address]; no updates    Consulted and Agree with Plan of Care Patient             Patient will benefit from skilled therapeutic intervention in order to improve the following deficits and impairments:  Abnormal gait, Decreased activity tolerance, Decreased balance, Decreased coordination, Decreased endurance, Decreased mobility, Decreased range of motion, Decreased strength, Difficulty walking, Hypomobility, Impaired flexibility  Visit Diagnosis: Unsteadiness on feet  Muscle weakness (generalized)  Difficulty in walking, not elsewhere classified  Abnormality of gait and mobility  Other lack of coordination     Problem List Patient Active Problem List   Diagnosis Date Noted   Swelling of limb 03/05/2019   Pain in limb 03/05/2019   History of stroke 03/04/2019   Polyneuropathy 07/31/2018   Bilateral foot pain  03/06/2018   Chronic bilateral low back pain 03/06/2018   Neck pain 03/06/2018   Neuropathy 02/06/2018  History of nonmelanoma skin cancer 09/27/2016   Essential hypertension 09/02/2016   Hiatal hernia    Stricture of esophagus    Absolute anemia    Iron deficiency anemia due to chronic blood loss 03/23/2016   Generalized abdominal pain 03/23/2016   History of tachycardia 07/29/2015   SOB (shortness of breath) on exertion 07/29/2015   BCC (basal cell carcinoma), eyelid 02/11/2013    Harish Bram, PT, DPT 10/28/2021, 8:19 AM  [place] MAIN [facility] SERVICES [place], [place], [address] Phone: [phone]   Fax:  [phone]  Name: Tracie Fisher MRN: [id number] Date of Birth: [birth year]

## 2021-10-28 ENCOUNTER — Ambulatory Visit: Payer: Medicare Other | Admitting: Physical Therapy

## 2021-11-02 ENCOUNTER — Ambulatory Visit: Payer: Medicare Other

## 2021-11-02 DIAGNOSIS — R262 Difficulty in walking, not elsewhere classified: Secondary | ICD-10-CM

## 2021-11-02 DIAGNOSIS — R2681 Unsteadiness on feet: Secondary | ICD-10-CM

## 2021-11-02 DIAGNOSIS — R278 Other lack of coordination: Secondary | ICD-10-CM

## 2021-11-02 DIAGNOSIS — M6281 Muscle weakness (generalized): Secondary | ICD-10-CM

## 2021-11-02 DIAGNOSIS — R269 Unspecified abnormalities of gait and mobility: Secondary | ICD-10-CM

## 2021-11-02 NOTE — Therapy (Unsigned)
[place] MAIN [facility] SERVICES [address] [place], [place], [address] Phone: [phone]   Fax:  [phone]  Physical Therapy Treatment  Patient Details  Name: Tracie Fisher MRN: [id number] Date of Birth: [birth year] Referring Provider (PT): Gurney Maxin   Encounter Date: 11/02/2021   PT End of Session - 11/02/21 1352     Visit Number 18    Number of Visits 25    Date for PT Re-Evaluation 11/25/21    Authorization Time Period 09/02/2021- 11/25/2021    Progress Note Due on Visit 20    PT Start Time 1345    PT Stop Time 1428    PT Time Calculation (min) 43 min    Equipment Utilized During Treatment Gait belt    Activity Tolerance Patient tolerated treatment well    Behavior During Therapy WFL for tasks assessed/performed             Past Medical History:  Diagnosis Date   Cancer ([place])    SKIN   Complication of anesthesia    Depression    Dysrhythmia    GERD (gastroesophageal reflux disease)    Hiatal hernia    HOH (hard of hearing)    Hypertension    Hypothyroidism    PONV (postoperative nausea and vomiting)    Thyroid disease     Past Surgical History:  Procedure Laterality Date   APPENDECTOMY     BACK SURGERY     MULTIPLE/ROD IN BACK   BREAST CYST ASPIRATION Left "years ago"   CATARACT EXTRACTION W/PHACO Left 03/21/2017   Procedure: CATARACT EXTRACTION PHACO AND INTRAOCULAR LENS PLACEMENT ([place]);  Surgeon: Birder Robson, MD;  Location: [facility] ORS;  Service: Ophthalmology;  Laterality: Left;  [place] 00:48 AP% 17.8 CDE 8.57 Fluid pack lot # [id number]   CATARACT EXTRACTION W/PHACO Right 04/11/2017   Procedure: CATARACT EXTRACTION PHACO AND INTRAOCULAR LENS PLACEMENT ([facility]);  Surgeon: Birder Robson, MD;  Location: [facility];  Service: Ophthalmology;  Laterality: Right;  [place] 01:09.5 AP% 15.8 CDE 11.04 Fluid Pack lot # [id number]   CHOLECYSTECTOMY     COLON SURGERY     BOWEL OBSTRUCTION   COLONOSCOPY WITH PROPOFOL N/A  04/25/2016   Procedure: COLONOSCOPY WITH PROPOFOL;  Surgeon: Jonathon Bellows, MD;  Location: [facility];  Service: Endoscopy;  Laterality: N/A;   ESOPHAGOGASTRODUODENOSCOPY (EGD) WITH PROPOFOL N/A 04/25/2016   Procedure: ESOPHAGOGASTRODUODENOSCOPY (EGD) WITH PROPOFOL;  Surgeon: Jonathon Bellows, MD;  Location: [facility];  Service: Endoscopy;  Laterality: N/A;   EXPLORATORY LAPAROTOMY  10/18/2011   blockage small intestines   GIVENS CAPSULE STUDY N/A 05/11/2016   Procedure: GIVENS CAPSULE STUDY;  Surgeon: Jonathon Bellows, MD;  Location: [facility];  Service: Endoscopy;  Laterality: N/A;    There were no vitals filed for this visit.   Subjective Assessment - 11/02/21 1350     Subjective Pt reports no new concerns. Waiting on referral for OT for her hand and assist with obtaining new brace.    Pertinent History Patient presents with referral for polyneuropathy/unsteadiness on feet. She endorses past medical history significant for HTN, skin cancer, Neuropathy with bilateral foot pain, Chronic back pain, OA.    Limitations Lifting;Standing;Walking;House hold activities    How long can you sit comfortably? No difficulty    How long can you stand comfortably? <30 min    How long can you walk comfortably? <5 min    Patient Stated Goals I just want to walk better.    Currently in Pain? No/denies  Pain Onset More than a month ago             INTERVENTIONS: 11/02/21   Neuromuscular Re-ed:   Dynamic March standing on airex pad x 25 reps   Dynamic standing with horizontal head turns on airex pads x 15 each   [place] activities:    - walking in between 2 rows of cones with wide separation x 10 feet down and back x 5.   - walking in between 2 rows of cones with more narrow separation x 10 feet down and back x 5.  - Reaching down to pick up cones x 10 using squat technique   Therex:   - Step up onto 1st step with BUE support x 10 reps alt LE  - Sit to stand- Without UE support x 12  reps  Education provided throughout session via VC/TC and demonstration to facilitate movement at target joints and correct muscle activation for all testing and exercises performed.   Rationale for Evaluation and Treatment Rehabilitation                            PT Education - 11/03/21 [phone]     Education Details Exercise technique    Person(s) Educated Patient    Methods Explanation;Demonstration;Tactile cues;Verbal cues    Comprehension Verbalized understanding;Returned demonstration;Verbal cues required;Tactile cues required;Need further instruction              PT Short Term Goals - 10/05/21 1115       PT SHORT TERM GOAL #1   Title Pt will be independent with initial HEP in order to improve strength and balance in order to decrease fall risk and improve function at home and work.    Baseline 09/02/2021= No formal HEP in place; 10/05/2021- Patient reports walking with walker at home and performing sitting exercises. She reports no questions with today.    Time 6    Period Weeks    Status Achieved    Target Date 10/14/21               PT Long Term Goals - 10/05/21 1118       PT LONG TERM GOAL #1   Title Pt will be independent with final HEP in order to improve strength and balance in order to decrease fall risk and improve function at home and work.    Baseline 09/02/2021= No formal HEP in place    Time 12    Period Weeks    Status New    Target Date 11/25/21      PT LONG TERM GOAL #2   Title Pt will improve FOTO to target score of 53 to display perceived improvements in ability to complete ADL's    Baseline 09/02/2021= 50; 10/05/2021=62    Time 12    Period Weeks    Status On-going    Target Date 11/25/21      PT LONG TERM GOAL #3   Title Pt will improve BERG by at least 5 points in order to demonstrate clinically significant improvement in balance.    Baseline 09/02/2021= 42/56; 10/05/2021=48/56    Time 12    Period Weeks    Status  On-going    Target Date 11/25/21      PT LONG TERM GOAL #4   Title Pt will decrease 5TSTS by at least 6 seconds in order to demonstrate clinically significant improvement in LE strength.    Baseline 09/02/2021=  22.63 sec without UE support; 10/05/2021= 17.85 sec without UE Support    Time 12    Period Weeks    Status On-going    Target Date 11/25/21      PT LONG TERM GOAL #5   Title Pt will decrease TUG to below 14 seconds/decrease in order to demonstrate decreased fall risk.    Baseline 09/02/2021= 17.9 sec without an AD; 10/05/2021= 16.31 sec without an AD    Time 12    Period Weeks    Status On-going    Target Date 11/25/21      PT LONG TERM GOAL #6   Title Pt will increase 10MWT by at least 0.2 m/s in order to demonstrate clinically significant improvement in community ambulation    Baseline 09/02/2021=0.56 m/s without an AD; 10/05/2021= 0.62 m/s    Time 12    Period Weeks    Status On-going    Target Date 11/25/21                   Plan - 11/02/21 1353     Clinical Impression Statement Patient performed well with today's activities and able to progress her balance- walking between narrowing pathways, bending and reaching and step negotiation without LOB. She also demo good overall strength as seen by independent ability to perform sit to stand and only fatigue as limiting factor today. Patient would benefit from additional skiled PT Intervention to improve strength, balance and mobility    Personal Factors and Comorbidities Comorbidity 2    Comorbidities HTN, OA    Examination-Activity Limitations Bend;Caring for Others;Carry;Lift;Squat;Stairs;Transfers;Stand    Examination-Participation Restrictions Community Activity;Shop;Yard Work    [employer] officer Evolving/Moderate complexity    Rehab Potential Good    PT Frequency 2x / week    PT Duration 12 weeks    PT Treatment/Interventions ADLs/Self Care Home Management;Canalith  Repostioning;Cryotherapy;Iontophoresis '4mg'$ /ml Dexamethasone;Moist Heat;Gait training;Stair training;Functional mobility training;Therapeutic activities;Therapeutic exercise;Balance training;Neuromuscular re-education;Patient/family education;Manual techniques;Passive range of motion;Dry needling;Splinting;Vestibular    PT Next Visit Plan Instruct in balance training and LE strengthening    PT Home Exercise Plan 09/14/2021=Access Code: [facility]  URL: [email or web address]; no updates    Consulted and Agree with Plan of Care Patient             Patient will benefit from skilled therapeutic intervention in order to improve the following deficits and impairments:  Abnormal gait, Decreased activity tolerance, Decreased balance, Decreased coordination, Decreased endurance, Decreased mobility, Decreased range of motion, Decreased strength, Difficulty walking, Hypomobility, Impaired flexibility  Visit Diagnosis: Unsteadiness on feet  Muscle weakness (generalized)  Difficulty in walking, not elsewhere classified  Abnormality of gait and mobility  Other lack of coordination     Problem List Patient Active Problem List   Diagnosis Date Noted   Swelling of limb 03/05/2019   Pain in limb 03/05/2019   History of stroke 03/04/2019   Polyneuropathy 07/31/2018   Bilateral foot pain 03/06/2018   Chronic bilateral low back pain 03/06/2018   Neck pain 03/06/2018   Neuropathy 02/06/2018   History of nonmelanoma skin cancer 09/27/2016   Essential hypertension 09/02/2016   Hiatal hernia    Stricture of esophagus    Absolute anemia    Iron deficiency anemia due to chronic blood loss 03/23/2016   Generalized abdominal pain 03/23/2016   History of tachycardia 07/29/2015   SOB (shortness of breath) on exertion 07/29/2015   BCC (basal cell carcinoma), eyelid 02/11/2013    Lewis Moccasin, PT 11/03/2021, 8:34 AM  [place] MAIN [facility]  SERVICES [address] [place], [place], [address] Phone: [phone]   Fax:  [phone]  Name: Tracie Fisher MRN: [id number] Date of Birth: [birth year]

## 2021-11-04 ENCOUNTER — Ambulatory Visit: Payer: Medicare Other

## 2021-11-04 DIAGNOSIS — R269 Unspecified abnormalities of gait and mobility: Secondary | ICD-10-CM

## 2021-11-04 DIAGNOSIS — R262 Difficulty in walking, not elsewhere classified: Secondary | ICD-10-CM

## 2021-11-04 DIAGNOSIS — R278 Other lack of coordination: Secondary | ICD-10-CM

## 2021-11-04 DIAGNOSIS — M6281 Muscle weakness (generalized): Secondary | ICD-10-CM | POA: Diagnosis not present

## 2021-11-04 DIAGNOSIS — R2681 Unsteadiness on feet: Secondary | ICD-10-CM

## 2021-11-04 NOTE — Therapy (Incomplete)
OUTPATIENT PHYSICAL THERAPY TREATMENT NOTE   Patient Name: Tracie Fisher MRN: [id number] DOB:10-Oct-1935, 86 y.o., female Today's Date: 11/04/2021  PCP: Dr. Benita Stabile REFERRING PROVIDER: Dr. Gurney Maxin   PT End of Session - 11/04/21 1439     Visit Number 19    Number of Visits 25    Date for PT Re-Evaluation 11/25/21    Authorization Time Period 09/02/2021- 11/25/2021    Progress Note Due on Visit 64    PT Start Time 1435    Equipment Utilized During Treatment Gait belt    Activity Tolerance Patient tolerated treatment well    Behavior During Therapy WFL for tasks assessed/performed             Past Medical History:  Diagnosis Date   Cancer ([place])    SKIN   Complication of anesthesia    Depression    Dysrhythmia    GERD (gastroesophageal reflux disease)    Hiatal hernia    HOH (hard of hearing)    Hypertension    Hypothyroidism    PONV (postoperative nausea and vomiting)    Thyroid disease    Past Surgical History:  Procedure Laterality Date   APPENDECTOMY     BACK SURGERY     MULTIPLE/ROD IN BACK   BREAST CYST ASPIRATION Left "years ago"   CATARACT EXTRACTION W/PHACO Left 03/21/2017   Procedure: CATARACT EXTRACTION PHACO AND INTRAOCULAR LENS PLACEMENT ([place]);  Surgeon: Birder Robson, MD;  Location: [facility];  Service: Ophthalmology;  Laterality: Left;  [place] 00:48 AP% 17.8 CDE 8.57 Fluid pack lot # [id number]   CATARACT EXTRACTION W/PHACO Right 04/11/2017   Procedure: CATARACT EXTRACTION PHACO AND INTRAOCULAR LENS PLACEMENT ([facility]);  Surgeon: Birder Robson, MD;  Location: [facility];  Service: Ophthalmology;  Laterality: Right;  [place] 01:09.5 AP% 15.8 CDE 11.04 Fluid Pack lot # [id number]   CHOLECYSTECTOMY     COLON SURGERY     BOWEL OBSTRUCTION   COLONOSCOPY WITH PROPOFOL N/A 04/25/2016   Procedure: COLONOSCOPY WITH PROPOFOL;  Surgeon: Jonathon Bellows, MD;  Location: [facility];  Service: Endoscopy;  Laterality: N/A;   ESOPHAGOGASTRODUODENOSCOPY (EGD) WITH  PROPOFOL N/A 04/25/2016   Procedure: ESOPHAGOGASTRODUODENOSCOPY (EGD) WITH PROPOFOL;  Surgeon: Jonathon Bellows, MD;  Location: [facility];  Service: Endoscopy;  Laterality: N/A;   EXPLORATORY LAPAROTOMY  10/18/2011   blockage small intestines   GIVENS CAPSULE STUDY N/A 05/11/2016   Procedure: GIVENS CAPSULE STUDY;  Surgeon: Jonathon Bellows, MD;  Location: [facility];  Service: Endoscopy;  Laterality: N/A;   Patient Active Problem List   Diagnosis Date Noted   Swelling of limb 03/05/2019   Pain in limb 03/05/2019   History of stroke 03/04/2019   Polyneuropathy 07/31/2018   Bilateral foot pain 03/06/2018   Chronic bilateral low back pain 03/06/2018   Neck pain 03/06/2018   Neuropathy 02/06/2018   History of nonmelanoma skin cancer 09/27/2016   Essential hypertension 09/02/2016   Hiatal hernia    Stricture of esophagus    Absolute anemia    Iron deficiency anemia due to chronic blood loss 03/23/2016   Generalized abdominal pain 03/23/2016   History of tachycardia 07/29/2015   SOB (shortness of breath) on exertion 07/29/2015   BCC (basal cell carcinoma), eyelid 02/11/2013    REFERRING DIAG: ***  THERAPY DIAG:  Unsteadiness on feet  Muscle weakness (generalized)  Difficulty in walking, not elsewhere classified  Abnormality of gait and mobility  Other lack of coordination  Rationale for Evaluation and Treatment Rehabilitation  PERTINENT HISTORY:  Patient presents with referral for polyneuropathy/unsteadiness on feet. She endorses past medical history significant for HTN, skin cancer, Neuropathy with bilateral foot pain, Chronic back pain, OA.  PRECAUTIONS: falls  SUBJECTIVE: Patient reports continued right thumb pain. Denies any other pain or falls  PAIN:  Are you having pain? Yes: NPRS scale: 3/10 Pain location: Right  Pain description: ache Aggravating factors: any right hand movement Relieving factors: rest     TODAY'S TREATMENT:  11/04/21 Therex:    - Step  up onto 1st step with BUE support x 10 reps alt LE - Seated hip march- 4lb. AW- x 10 reps -alt LE -Seated Knee ext 4lb AW- 10 reps alt LE  -Seated hamstring BTB- x 12 reps - Sit to stand- Without UE support x 12 reps  Neuromuscular re-ed:   Dynamic marching standing on airex pad x 20 reps  Side step up/over 1/2 foam (to left then right) x 20 reps each  Forward/backward steps over 1/2 foam x 20 reps     PATIENT EDUCATION: Education details: Exercise technique Person educated: Patient Education method: Explanation, Demonstration, Tactile cues, and Verbal cues Education comprehension: verbalized understanding, returned demonstration, verbal cues required, tactile cues required, and needs further education   HOME EXERCISE PROGRAM: No updates this visit   PT Short Term Goals - 10/05/21 1115       PT SHORT TERM GOAL #1   Title Pt will be independent with initial HEP in order to improve strength and balance in order to decrease fall risk and improve function at home and work.    Baseline 09/02/2021= No formal HEP in place; 10/05/2021- Patient reports walking with walker at home and performing sitting exercises. She reports no questions with today.    Time 6    Period Weeks    Status Achieved    Target Date 10/14/21              PT Long Term Goals - 10/05/21 1118       PT LONG TERM GOAL #1   Title Pt will be independent with final HEP in order to improve strength and balance in order to decrease fall risk and improve function at home and work.    Baseline 09/02/2021= No formal HEP in place    Time 12    Period Weeks    Status New    Target Date 11/25/21      PT LONG TERM GOAL #2   Title Pt will improve FOTO to target score of 53 to display perceived improvements in ability to complete ADL's    Baseline 09/02/2021= 50; 10/05/2021=62    Time 12    Period Weeks    Status On-going    Target Date 11/25/21      PT LONG TERM GOAL #3   Title Pt will improve BERG by at least  5 points in order to demonstrate clinically significant improvement in balance.    Baseline 09/02/2021= 42/56; 10/05/2021=48/56    Time 12    Period Weeks    Status On-going    Target Date 11/25/21      PT LONG TERM GOAL #4   Title Pt will decrease 5TSTS by at least 6 seconds in order to demonstrate clinically significant improvement in LE strength.    Baseline 09/02/2021= 22.63 sec without UE support; 10/05/2021= 17.85 sec without UE Support    Time 12    Period Weeks    Status On-going    Target Date 11/25/21  PT LONG TERM GOAL #5   Title Pt will decrease TUG to below 14 seconds/decrease in order to demonstrate decreased fall risk.    Baseline 09/02/2021= 17.9 sec without an AD; 10/05/2021= 16.31 sec without an AD    Time 12    Period Weeks    Status On-going    Target Date 11/25/21      PT LONG TERM GOAL #6   Title Pt will increase 10MWT by at least 0.2 m/s in order to demonstrate clinically significant improvement in community ambulation    Baseline 09/02/2021=0.56 m/s without an AD; 10/05/2021= 0.62 m/s    Time 12    Period Weeks    Status On-going    Target Date 11/25/21              Plan - 11/04/21 1441     Personal Factors and Comorbidities Comorbidity 2    Comorbidities HTN, OA    Examination-Activity Limitations Bend;Caring for Others;Carry;Lift;Squat;Stairs;Transfers;Stand    Examination-Participation Restrictions Community Activity;Shop;Yard Work    [employer] officer Evolving/Moderate complexity    Rehab Potential Good    PT Frequency 2x / week    PT Duration 12 weeks    PT Treatment/Interventions ADLs/Self Care Home Management;Canalith Repostioning;Cryotherapy;Iontophoresis '4mg'$ /ml Dexamethasone;Moist Heat;Gait training;Stair training;Functional mobility training;Therapeutic activities;Therapeutic exercise;Balance training;Neuromuscular re-education;Patient/family education;Manual techniques;Passive range of motion;Dry  needling;Splinting;Vestibular    PT Next Visit Plan Instruct in balance training and LE strengthening    PT Home Exercise Plan 09/14/2021=Access Code: [facility]  URL: [email or web address]; no updates    Consulted and Agree with Plan of Care Patient               Lewis Moccasin, PT 11/04/2021, 2:42 PM

## 2021-11-09 ENCOUNTER — Ambulatory Visit: Payer: Medicare Other | Admitting: Physical Therapy

## 2021-11-09 ENCOUNTER — Encounter: Payer: Self-pay | Admitting: Physical Therapy

## 2021-11-09 DIAGNOSIS — R2681 Unsteadiness on feet: Secondary | ICD-10-CM

## 2021-11-09 DIAGNOSIS — R269 Unspecified abnormalities of gait and mobility: Secondary | ICD-10-CM

## 2021-11-09 DIAGNOSIS — R278 Other lack of coordination: Secondary | ICD-10-CM

## 2021-11-09 DIAGNOSIS — R262 Difficulty in walking, not elsewhere classified: Secondary | ICD-10-CM

## 2021-11-09 DIAGNOSIS — M6281 Muscle weakness (generalized): Secondary | ICD-10-CM | POA: Diagnosis not present

## 2021-11-09 NOTE — Therapy (Signed)
OUTPATIENT PHYSICAL THERAPY TREATMENT NOTE Physical Therapy Progress Note   Dates of reporting period  10/05/21   to   11/09/21    Patient Name: Tracie Fisher MRN: [id number] DOB:June 27, 1935, 86 y.o., female Today's Date: 11/10/2021  PCP: Dr. Benita Stabile REFERRING PROVIDER: Dr. Gurney Maxin   PT End of Session - 11/09/21 1436     Visit Number 20    Number of Visits 25    Date for PT Re-Evaluation 11/25/21    Authorization Time Period 09/02/2021- 11/25/2021    Progress Note Due on Visit 32    PT Start Time 1432    PT Stop Time 1515    PT Time Calculation (min) 43 min    Equipment Utilized During Treatment Gait belt    Activity Tolerance Patient tolerated treatment well    Behavior During Therapy WFL for tasks assessed/performed             Past Medical History:  Diagnosis Date   Cancer ([place])    SKIN   Complication of anesthesia    Depression    Dysrhythmia    GERD (gastroesophageal reflux disease)    Hiatal hernia    HOH (hard of hearing)    Hypertension    Hypothyroidism    PONV (postoperative nausea and vomiting)    Thyroid disease    Past Surgical History:  Procedure Laterality Date   APPENDECTOMY     BACK SURGERY     MULTIPLE/ROD IN BACK   BREAST CYST ASPIRATION Left "years ago"   CATARACT EXTRACTION W/PHACO Left 03/21/2017   Procedure: CATARACT EXTRACTION PHACO AND INTRAOCULAR LENS PLACEMENT ([place]);  Surgeon: Birder Robson, MD;  Location: [facility];  Service: Ophthalmology;  Laterality: Left;  [place] 00:48 AP% 17.8 CDE 8.57 Fluid pack lot # [id number]   CATARACT EXTRACTION W/PHACO Right 04/11/2017   Procedure: CATARACT EXTRACTION PHACO AND INTRAOCULAR LENS PLACEMENT ([facility]);  Surgeon: Birder Robson, MD;  Location: [facility];  Service: Ophthalmology;  Laterality: Right;  [place] 01:09.5 AP% 15.8 CDE 11.04 Fluid Pack lot # [id number]   CHOLECYSTECTOMY     COLON SURGERY     BOWEL OBSTRUCTION   COLONOSCOPY WITH PROPOFOL N/A 04/25/2016   Procedure: COLONOSCOPY  WITH PROPOFOL;  Surgeon: Jonathon Bellows, MD;  Location: [facility];  Service: Endoscopy;  Laterality: N/A;   ESOPHAGOGASTRODUODENOSCOPY (EGD) WITH PROPOFOL N/A 04/25/2016   Procedure: ESOPHAGOGASTRODUODENOSCOPY (EGD) WITH PROPOFOL;  Surgeon: Jonathon Bellows, MD;  Location: [facility];  Service: Endoscopy;  Laterality: N/A;   EXPLORATORY LAPAROTOMY  10/18/2011   blockage small intestines   GIVENS CAPSULE STUDY N/A 05/11/2016   Procedure: GIVENS CAPSULE STUDY;  Surgeon: Jonathon Bellows, MD;  Location: [facility];  Service: Endoscopy;  Laterality: N/A;   Patient Active Problem List   Diagnosis Date Noted   Swelling of limb 03/05/2019   Pain in limb 03/05/2019   History of stroke 03/04/2019   Polyneuropathy 07/31/2018   Bilateral foot pain 03/06/2018   Chronic bilateral low back pain 03/06/2018   Neck pain 03/06/2018   Neuropathy 02/06/2018   History of nonmelanoma skin cancer 09/27/2016   Essential hypertension 09/02/2016   Hiatal hernia    Stricture of esophagus    Absolute anemia    Iron deficiency anemia due to chronic blood loss 03/23/2016   Generalized abdominal pain 03/23/2016   History of tachycardia 07/29/2015   SOB (shortness of breath) on exertion 07/29/2015   BCC (basal cell carcinoma), eyelid 02/11/2013    REFERRING DIAG:  Polyneuropathy, unspecified  R26.81 (  ICD-10-CM) - Unsteadiness on feet    THERAPY DIAG:  Unsteadiness on feet  Muscle weakness (generalized)  Difficulty in walking, not elsewhere classified  Abnormality of gait and mobility  Other lack of coordination  Rationale for Evaluation and Treatment Rehabilitation  PERTINENT HISTORY: Patient presents with referral for polyneuropathy/unsteadiness on feet. She endorses past medical history significant for HTN, skin cancer, Neuropathy with bilateral foot pain, Chronic back pain, OA.  PRECAUTIONS: falls  SUBJECTIVE: Patient reports feeling bad all weekend. She reports having a headache and not feeling  well. She reports falling this morning. She was able to get up and feels like her legs are stronger as she could get up a little easier than before.   She reports her hand is still bothering her considerably. We are still waiting on order for OT referral to assess for proper hand brace/splint;   PAIN:  Are you having pain? Yes: NPRS scale: 10/10 Pain location: Right wrist  Pain description: ache Aggravating factors: any right hand movement Relieving factors: rest     TODAY'S TREATMENT:  11/09/21  Assessed vitals: BP: 98/61 (Left arm sitting), HR 59  FUNCTIONAL OUTCOME MEASURES       Results 09/02/21 Results 10/05/21 Results 11/09/21 Comments  BERG 42/56 48/56  Fall risk, in need of intervention  FOTO 50 62%   59%    TUG 17.9 seconds 16.31 sec  12.65 sec    5TSTS  22.63 seconds 17.85 sec  17.26 sec Without UE support  10 Meter Gait Speed Self-selected: 0.56 m/s;  0.62 m/s  0.69 m/s without AD Below normative values for full community ambulation  6 min walk test    600 feet without AD Vitals at start: HR: 55, SpO2 99%, BP: 98/51 Vitals after 6 min walk: HR: 78, Spo2 95%, BP: 137/57      Patient instructed in outcome measures to address progress towards goals, see above;   Education provided throughout session via VC/TC and demonstration to facilitate movement at target joints and correct muscle activation for all testing and exercises performed.       PATIENT EDUCATION: Education details: Exercise technique Person educated: Patient Education method: Explanation, Demonstration, Tactile cues, and Verbal cues Education comprehension: verbalized understanding, returned demonstration, verbal cues required, tactile cues required, and needs further education   HOME EXERCISE PROGRAM: No updates this visit   PT Short Term Goals -      PT SHORT TERM GOAL #1   Title Pt will be independent with initial HEP in order to improve strength and balance in order to decrease fall risk  and improve function at home and work.    Baseline 09/02/2021= No formal HEP in place; 10/05/2021- Patient reports walking with walker at home and performing sitting exercises. She reports no questions with today.    Time 6    Period Weeks    Status Achieved    Target Date 10/14/21              PT Long Term Goals -        PT LONG TERM GOAL #1   Title Pt will be independent with final HEP in order to improve strength and balance in order to decrease fall risk and improve function at home and work.    Baseline 09/02/2021= No formal HEP in place 6/27: independent, adherent inconsistent;    Time 12    Period Weeks    Status Partially Met   Target Date 11/25/21  PT LONG TERM GOAL #2   Title Pt will improve FOTO to target score of 53 to display perceived improvements in ability to complete ADL's    Baseline 09/02/2021= 50; 10/05/2021=62, 6/27: 59%   Time 12    Period Weeks    Status Achieved   Target Date 11/25/21      PT LONG TERM GOAL #3   Title Pt will improve BERG by at least 5 points in order to demonstrate clinically significant improvement in balance.    Baseline 09/02/2021= 42/56; 10/05/2021=48/56    Time 12    Period Weeks    Status Achieved   Target Date 11/25/21      PT LONG TERM GOAL #4   Title Pt will decrease 5TSTS by at least 6 seconds in order to demonstrate clinically significant improvement in LE strength.    Baseline 09/02/2021= 22.63 sec without UE support; 10/05/2021= 17.85 sec without UE Support  6/27: 17.2 sec   Time 12    Period Weeks    Status Partially Met   Target Date 11/25/21      PT LONG TERM GOAL #5   Title Pt will decrease TUG to below 14 seconds/decrease in order to demonstrate decreased fall risk.    Baseline 09/02/2021= 17.9 sec without an AD; 10/05/2021= 16.31 sec without an AD 6/27: 12.65 sec   Time 12    Period Weeks    Status Achieved    Target Date 11/25/21      PT LONG TERM GOAL #6   Title Pt will increase 10MWT by at least 0.2 m/s  in order to demonstrate clinically significant improvement in community ambulation    Baseline 09/02/2021=0.56 m/s without an AD; 10/05/2021= 0.62 m/s 6/27: 0.69 m/s   Time 12    Period Weeks    Status Partially Met   Target Date 11/25/21    PT LONG TERM GOAL #7  Title Pt will improve 6 min walk test by 100 feet to indicate improved functional gait distance for community ambulation.   Baseline 6/27: 600 feet  Time 4  Period Weeks   Status New  Target Date 11/25/21             Plan - 11/04/21 1441     Clinical Impression Statement Patient motivated and participated well within session. She was instructed in outcome measures to address progress towards goals. She continues to exhibit improvement in gait speed and transfer ability. Patient does report increased shortness of breath with prolonged ambulation. She was limited with 6 min walk test and exhibits deconditioning. Plan to add cardiovascular conditioning to current plan of care. Patient also reports continued RUE wrist pain. Waiting on MD referral for OT for hand therapist to assess.  Patient would benefit from additional skiled PT Intervention to improve strength, balance and mobility    Personal Factors and Comorbidities Comorbidity 2    Comorbidities HTN, OA    Examination-Activity Limitations Bend;Caring for Others;Carry;Lift;Squat;Stairs;Transfers;Stand    Examination-Participation Restrictions Community Activity;Shop;Yard Work    [employer] officer Evolving/Moderate complexity    Rehab Potential Good    PT Frequency 2x / week    PT Duration 12 weeks    PT Treatment/Interventions ADLs/Self Care Home Management;Canalith Repostioning;Cryotherapy;Iontophoresis 4mg /ml Dexamethasone;Moist Heat;Gait training;Stair training;Functional mobility training;Therapeutic activities;Therapeutic exercise;Balance training;Neuromuscular re-education;Patient/family education;Manual techniques;Passive range of motion;Dry  needling;Splinting;Vestibular    PT Next Visit Plan Instruct in balance training and LE strengthening    PT Home Exercise Plan 09/14/2021=Access Code: [facility]  URL: [email or web address]; no  updates    Consulted and Agree with Plan of Care Patient               Lyle Leisner, PT, DPT 11/10/2021, 8:36 AM

## 2021-11-11 ENCOUNTER — Ambulatory Visit: Payer: Medicare Other

## 2021-11-11 DIAGNOSIS — R2681 Unsteadiness on feet: Secondary | ICD-10-CM

## 2021-11-11 DIAGNOSIS — M6281 Muscle weakness (generalized): Secondary | ICD-10-CM | POA: Diagnosis not present

## 2021-11-11 DIAGNOSIS — R262 Difficulty in walking, not elsewhere classified: Secondary | ICD-10-CM

## 2021-11-11 NOTE — Therapy (Signed)
OUTPATIENT PHYSICAL THERAPY TREATMENT NOTE   Patient Name: Tracie Fisher MRN: [id number] DOB:1935-10-21, 86 y.o., female Today's Date: 11/11/2021  PCP: Dr. Benita Stabile REFERRING PROVIDER: Dr. Gurney Maxin   PT End of Session - 11/11/21 1248     Visit Number 21    Number of Visits 25    Date for PT Re-Evaluation 11/25/21    Authorization Time Period 09/02/2021- 11/25/2021    Progress Note Due on Visit 33    PT Start Time 1300    PT Stop Time 1344    PT Time Calculation (min) 44 min    Equipment Utilized During Treatment Gait belt    Activity Tolerance Patient tolerated treatment well    Behavior During Therapy WFL for tasks assessed/performed              Past Medical History:  Diagnosis Date   Cancer ([place])    SKIN   Complication of anesthesia    Depression    Dysrhythmia    GERD (gastroesophageal reflux disease)    Hiatal hernia    HOH (hard of hearing)    Hypertension    Hypothyroidism    PONV (postoperative nausea and vomiting)    Thyroid disease    Past Surgical History:  Procedure Laterality Date   APPENDECTOMY     BACK SURGERY     MULTIPLE/ROD IN BACK   BREAST CYST ASPIRATION Left "years ago"   CATARACT EXTRACTION W/PHACO Left 03/21/2017   Procedure: CATARACT EXTRACTION PHACO AND INTRAOCULAR LENS PLACEMENT ([place]);  Surgeon: Birder Robson, MD;  Location: [facility];  Service: Ophthalmology;  Laterality: Left;  [place] 00:48 AP% 17.8 CDE 8.57 Fluid pack lot # [id number]   CATARACT EXTRACTION W/PHACO Right 04/11/2017   Procedure: CATARACT EXTRACTION PHACO AND INTRAOCULAR LENS PLACEMENT ([facility]);  Surgeon: Birder Robson, MD;  Location: [facility];  Service: Ophthalmology;  Laterality: Right;  [place] 01:09.5 AP% 15.8 CDE 11.04 Fluid Pack lot # [id number]   CHOLECYSTECTOMY     COLON SURGERY     BOWEL OBSTRUCTION   COLONOSCOPY WITH PROPOFOL N/A 04/25/2016   Procedure: COLONOSCOPY WITH PROPOFOL;  Surgeon: Jonathon Bellows, MD;  Location: [facility];  Service: Endoscopy;   Laterality: N/A;   ESOPHAGOGASTRODUODENOSCOPY (EGD) WITH PROPOFOL N/A 04/25/2016   Procedure: ESOPHAGOGASTRODUODENOSCOPY (EGD) WITH PROPOFOL;  Surgeon: Jonathon Bellows, MD;  Location: [facility];  Service: Endoscopy;  Laterality: N/A;   EXPLORATORY LAPAROTOMY  10/18/2011   blockage small intestines   GIVENS CAPSULE STUDY N/A 05/11/2016   Procedure: GIVENS CAPSULE STUDY;  Surgeon: Jonathon Bellows, MD;  Location: [facility];  Service: Endoscopy;  Laterality: N/A;   Patient Active Problem List   Diagnosis Date Noted   Swelling of limb 03/05/2019   Pain in limb 03/05/2019   History of stroke 03/04/2019   Polyneuropathy 07/31/2018   Bilateral foot pain 03/06/2018   Chronic bilateral low back pain 03/06/2018   Neck pain 03/06/2018   Neuropathy 02/06/2018   History of nonmelanoma skin cancer 09/27/2016   Essential hypertension 09/02/2016   Hiatal hernia    Stricture of esophagus    Absolute anemia    Iron deficiency anemia due to chronic blood loss 03/23/2016   Generalized abdominal pain 03/23/2016   History of tachycardia 07/29/2015   SOB (shortness of breath) on exertion 07/29/2015   BCC (basal cell carcinoma), eyelid 02/11/2013    REFERRING DIAG:  Polyneuropathy, unspecified  R26.81 (ICD-10-CM) - Unsteadiness on feet    THERAPY DIAG:  Unsteadiness on feet  Muscle weakness (generalized)  Difficulty in walking, not elsewhere classified  Rationale for Evaluation and Treatment Rehabilitation  PERTINENT HISTORY: Patient presents with referral for polyneuropathy/unsteadiness on feet. She endorses past medical history significant for HTN, skin cancer, Neuropathy with bilateral foot pain, Chronic back pain, OA.  PRECAUTIONS: falls  SUBJECTIVE: Patient walked down to PT today, had some help walking down from a volunteer halfway down. Not feeling fully better yet, wrist still painful.   PAIN:  Are you having pain? Yes: NPRS scale: 4/10 Pain location: Right  Pain description:  ache Aggravating factors: any right hand movement Relieving factors: rest     TODAY'S TREATMENT:  11/11/21 BP at start of session seated: 117/56; standing 100/65  Therex:    - 6" step toe taps with intermittent UE support x 10 reps alt LE - Seated hip march- 4lb. AW- x 10 reps -alt LE -Seated LAQ 4lb AW- 10 reps alt LE  -seated alternating ER/IR 10x each LE with #4lb ankle weight -seated heel raise #4lb 15x  -Seated hamstring BTB- x 12 reps - Sit to stand- Without UE support x 12 reps -seated LAQ with adduction ball between feet 10x  -seated adduction ball squeeze 10x -walk 150 ft with single hand support    Neuromuscular re-ed:  -airex pad 6" step tandem stance 30 seconds each LE placement  -airex pad: visual and vertical head turns with dual task of cognitive game x8 minutes   Education provided throughout session via VC/TC and demonstration to facilitate movement at target joints and correct muscle activation for all testing and exercises performed.       PATIENT EDUCATION: Education details: Exercise technique Person educated: Patient Education method: Explanation, Demonstration, Tactile cues, and Verbal cues Education comprehension: verbalized understanding, returned demonstration, verbal cues required, tactile cues required, and needs further education   HOME EXERCISE PROGRAM: No updates this visit   PT Short Term Goals -      PT SHORT TERM GOAL #1   Title Pt will be independent with initial HEP in order to improve strength and balance in order to decrease fall risk and improve function at home and work.    Baseline 09/02/2021= No formal HEP in place; 10/05/2021- Patient reports walking with walker at home and performing sitting exercises. She reports no questions with today.    Time 6    Period Weeks    Status Achieved    Target Date 10/14/21              PT Long Term Goals -        PT LONG TERM GOAL #1   Title Pt will be independent with final  HEP in order to improve strength and balance in order to decrease fall risk and improve function at home and work.    Baseline 09/02/2021= No formal HEP in place    Time 12    Period Weeks    Status New    Target Date 11/25/21      PT LONG TERM GOAL #2   Title Pt will improve FOTO to target score of 53 to display perceived improvements in ability to complete ADL's    Baseline 09/02/2021= 50; 10/05/2021=62    Time 12    Period Weeks    Status On-going    Target Date 11/25/21      PT LONG TERM GOAL #3   Title Pt will improve BERG by at least 5 points in order to demonstrate clinically significant improvement in balance.    Baseline  09/02/2021= 42/56; 10/05/2021=48/56    Time 12    Period Weeks    Status On-going    Target Date 11/25/21      PT LONG TERM GOAL #4   Title Pt will decrease 5TSTS by at least 6 seconds in order to demonstrate clinically significant improvement in LE strength.    Baseline 09/02/2021= 22.63 sec without UE support; 10/05/2021= 17.85 sec without UE Support    Time 12    Period Weeks    Status On-going    Target Date 11/25/21      PT LONG TERM GOAL #5   Title Pt will decrease TUG to below 14 seconds/decrease in order to demonstrate decreased fall risk.    Baseline 09/02/2021= 17.9 sec without an AD; 10/05/2021= 16.31 sec without an AD    Time 12    Period Weeks    Status On-going    Target Date 11/25/21      PT LONG TERM GOAL #6   Title Pt will increase 10MWT by at least 0.2 m/s in order to demonstrate clinically significant improvement in community ambulation    Baseline 09/02/2021=0.56 m/s without an AD; 10/05/2021= 0.62 m/s    Time 12    Period Weeks    Status On-going    Target Date 11/25/21              Plan    Clinical Impression Statement Patient has episodes of orthostatic hypotension which is monitored throughout session today. Patient awaiting order for her R wrist due to continuation of pain. Patient does require intermittent rest breaks  from fatigue but remains highly motivated for progression of care. Patient would benefit from additional skiled PT Intervention to improve strength, balance and mobility    Personal Factors and Comorbidities Comorbidity 2    Comorbidities HTN, OA    Examination-Activity Limitations Bend;Caring for Others;Carry;Lift;Squat;Stairs;Transfers;Stand    Examination-Participation Restrictions Community Activity;Shop;Yard Work    [employer] officer Evolving/Moderate complexity    Rehab Potential Good    PT Frequency 2x / week    PT Duration 12 weeks    PT Treatment/Interventions ADLs/Self Care Home Management;Canalith Repostioning;Cryotherapy;Iontophoresis '4mg'$ /ml Dexamethasone;Moist Heat;Gait training;Stair training;Functional mobility training;Therapeutic activities;Therapeutic exercise;Balance training;Neuromuscular re-education;Patient/family education;Manual techniques;Passive range of motion;Dry needling;Splinting;Vestibular    PT Next Visit Plan Instruct in balance training and LE strengthening    PT Home Exercise Plan 09/14/2021=Access Code: [facility]  URL: [email or web address]; no updates    Consulted and Agree with Plan of Care Patient               Janna Arch, PT 11/11/2021, 1:44 PM

## 2021-11-15 ENCOUNTER — Ambulatory Visit: Payer: Medicare Other | Attending: Neurology | Admitting: Physical Therapy

## 2021-11-15 DIAGNOSIS — R2681 Unsteadiness on feet: Secondary | ICD-10-CM | POA: Diagnosis not present

## 2021-11-15 DIAGNOSIS — M6281 Muscle weakness (generalized): Secondary | ICD-10-CM | POA: Diagnosis present

## 2021-11-15 DIAGNOSIS — R262 Difficulty in walking, not elsewhere classified: Secondary | ICD-10-CM | POA: Insufficient documentation

## 2021-11-15 DIAGNOSIS — R269 Unspecified abnormalities of gait and mobility: Secondary | ICD-10-CM | POA: Diagnosis present

## 2021-11-15 DIAGNOSIS — M79641 Pain in right hand: Secondary | ICD-10-CM | POA: Diagnosis present

## 2021-11-15 NOTE — Therapy (Addendum)
OUTPATIENT PHYSICAL THERAPY TREATMENT NOTE   Patient Name: Tracie Fisher MRN: [id number] DOB:06/30/1935, 86 y.o., female Today's Date: 11/15/2021  PCP: Dr. Benita Stabile REFERRING PROVIDER: Dr. Gurney Maxin   PT End of Session - 11/15/21 1353     Visit Number 22    Number of Visits 25    Date for PT Re-Evaluation 11/25/21    Authorization Time Period 09/02/2021- 11/25/2021    Progress Note Due on Visit 6    PT Start Time 1349    PT Stop Time 1430    PT Time Calculation (min) 41 min    Equipment Utilized During Treatment Gait belt    Activity Tolerance Patient tolerated treatment well    Behavior During Therapy WFL for tasks assessed/performed              Past Medical History:  Diagnosis Date   Cancer ([place])    SKIN   Complication of anesthesia    Depression    Dysrhythmia    GERD (gastroesophageal reflux disease)    Hiatal hernia    HOH (hard of hearing)    Hypertension    Hypothyroidism    PONV (postoperative nausea and vomiting)    Thyroid disease    Past Surgical History:  Procedure Laterality Date   APPENDECTOMY     BACK SURGERY     MULTIPLE/ROD IN BACK   BREAST CYST ASPIRATION Left "years ago"   CATARACT EXTRACTION W/PHACO Left 03/21/2017   Procedure: CATARACT EXTRACTION PHACO AND INTRAOCULAR LENS PLACEMENT ([place]);  Surgeon: Birder Robson, MD;  Location: [facility];  Service: Ophthalmology;  Laterality: Left;  [place] 00:48 AP% 17.8 CDE 8.57 Fluid pack lot # [id number]   CATARACT EXTRACTION W/PHACO Right 04/11/2017   Procedure: CATARACT EXTRACTION PHACO AND INTRAOCULAR LENS PLACEMENT ([facility]);  Surgeon: Birder Robson, MD;  Location: [facility];  Service: Ophthalmology;  Laterality: Right;  [place] 01:09.5 AP% 15.8 CDE 11.04 Fluid Pack lot # [id number]   CHOLECYSTECTOMY     COLON SURGERY     BOWEL OBSTRUCTION   COLONOSCOPY WITH PROPOFOL N/A 04/25/2016   Procedure: COLONOSCOPY WITH PROPOFOL;  Surgeon: Jonathon Bellows, MD;  Location: [facility];  Service: Endoscopy;   Laterality: N/A;   ESOPHAGOGASTRODUODENOSCOPY (EGD) WITH PROPOFOL N/A 04/25/2016   Procedure: ESOPHAGOGASTRODUODENOSCOPY (EGD) WITH PROPOFOL;  Surgeon: Jonathon Bellows, MD;  Location: [facility];  Service: Endoscopy;  Laterality: N/A;   EXPLORATORY LAPAROTOMY  10/18/2011   blockage small intestines   GIVENS CAPSULE STUDY N/A 05/11/2016   Procedure: GIVENS CAPSULE STUDY;  Surgeon: Jonathon Bellows, MD;  Location: [facility];  Service: Endoscopy;  Laterality: N/A;   Patient Active Problem List   Diagnosis Date Noted   Swelling of limb 03/05/2019   Pain in limb 03/05/2019   History of stroke 03/04/2019   Polyneuropathy 07/31/2018   Bilateral foot pain 03/06/2018   Chronic bilateral low back pain 03/06/2018   Neck pain 03/06/2018   Neuropathy 02/06/2018   History of nonmelanoma skin cancer 09/27/2016   Essential hypertension 09/02/2016   Hiatal hernia    Stricture of esophagus    Absolute anemia    Iron deficiency anemia due to chronic blood loss 03/23/2016   Generalized abdominal pain 03/23/2016   History of tachycardia 07/29/2015   SOB (shortness of breath) on exertion 07/29/2015   BCC (basal cell carcinoma), eyelid 02/11/2013    REFERRING DIAG:  Polyneuropathy, unspecified  R26.81 (ICD-10-CM) - Unsteadiness on feet    THERAPY DIAG:  Unsteadiness on feet  Difficulty in walking,  not elsewhere classified  Rationale for Evaluation and Treatment Rehabilitation  PERTINENT HISTORY: Patient presents with referral for polyneuropathy/unsteadiness on feet. She endorses past medical history significant for HTN, skin cancer, Neuropathy with bilateral foot pain, Chronic back pain, OA.  PRECAUTIONS: falls  SUBJECTIVE: Patient having increased foot pain this date. Pt has tried to communicate with MD regarding this but has not heard anything back at this time.   PAIN:  Are you having pain? Yes: NPRS scale: 9/10 Pain location: Feet   Pain description: dull ache  Aggravating factors:  standing Relieving factors: rest     TODAY'S TREATMENT:  11/11/21  Therex:    - Seated hip march- 4lb. AW- x 10 reps -alt LE -Seated LAQ 4lb AW- 10 reps alt LE  -seated alternating ER/IR 10x each LE with #4lb ankle weight -seated heel raise #4lb 15x  -Seated hamstring BTB- x 0 reps - Pt thinks it could be bothering her nerve pain and requests to not complete Standing HS curls x 10  with 4# AW  -seated adduction ball squeeze 10x - Sit to stand- Without UE support x 12 reps -seated LAQ with adduction ball between feet 10x  - standing march on airex pad      Treatment today limited secondary to some pain and discomfort in her heels bilaterally. Pt is reaching out to MD regarding this pain and discomfort.    Education provided throughout session via VC/TC and demonstration to facilitate movement at target joints and correct muscle activation for all testing and exercises performed.       PATIENT EDUCATION: Education details: Exercise technique Person educated: Patient Education method: Explanation, Demonstration, Tactile cues, and Verbal cues Education comprehension: verbalized understanding, returned demonstration, verbal cues required, tactile cues required, and needs further education   HOME EXERCISE PROGRAM: No updates this visit   PT Short Term Goals -      PT SHORT TERM GOAL #1   Title Pt will be independent with initial HEP in order to improve strength and balance in order to decrease fall risk and improve function at home and work.    Baseline 09/02/2021= No formal HEP in place; 10/05/2021- Patient reports walking with walker at home and performing sitting exercises. She reports no questions with today.    Time 6    Period Weeks    Status Achieved    Target Date 10/14/21              PT Long Term Goals -        PT LONG TERM GOAL #1   Title Pt will be independent with final HEP in order to improve strength and balance in order to decrease fall risk and  improve function at home and work.    Baseline 09/02/2021= No formal HEP in place    Time 12    Period Weeks    Status New    Target Date 11/25/21      PT LONG TERM GOAL #2   Title Pt will improve FOTO to target score of 53 to display perceived improvements in ability to complete ADL's    Baseline 09/02/2021= 50; 10/05/2021=62    Time 12    Period Weeks    Status On-going    Target Date 11/25/21      PT LONG TERM GOAL #3   Title Pt will improve BERG by at least 5 points in order to demonstrate clinically significant improvement in balance.    Baseline 09/02/2021= 42/56; 10/05/2021=48/56  Time 12    Period Weeks    Status On-going    Target Date 11/25/21      PT LONG TERM GOAL #4   Title Pt will decrease 5TSTS by at least 6 seconds in order to demonstrate clinically significant improvement in LE strength.    Baseline 09/02/2021= 22.63 sec without UE support; 10/05/2021= 17.85 sec without UE Support    Time 12    Period Weeks    Status On-going    Target Date 11/25/21      PT LONG TERM GOAL #5   Title Pt will decrease TUG to below 14 seconds/decrease in order to demonstrate decreased fall risk.    Baseline 09/02/2021= 17.9 sec without an AD; 10/05/2021= 16.31 sec without an AD    Time 12    Period Weeks    Status On-going    Target Date 11/25/21      PT LONG TERM GOAL #6   Title Pt will increase 10MWT by at least 0.2 m/s in order to demonstrate clinically significant improvement in community ambulation    Baseline 09/02/2021=0.56 m/s without an AD; 10/05/2021= 0.62 m/s    Time 12    Period Weeks    Status On-going    Target Date 11/25/21              Plan    Clinical Impression Statement Pt presents with complaints of foot pain this session since several treatments were limited based on this pain.  Physical therapist asked her to complete primarily seated therapeutic exercises to improve lower extremity strength and also incorporated minimal amounts of standing activities  patient did have complaints of pain in her heels with these activities.  Patient will continue to benefit with skilled physical therapy to improve her lower extremity strength, balance, and mobility.   Personal Factors and Comorbidities Comorbidity 2    Comorbidities HTN, OA    Examination-Activity Limitations Bend;Caring for Others;Carry;Lift;Squat;Stairs;Transfers;Stand    Examination-Participation Restrictions Community Activity;Shop;Yard Work    [employer] officer Evolving/Moderate complexity    Rehab Potential Good    PT Frequency 2x / week    PT Duration 12 weeks    PT Treatment/Interventions ADLs/Self Care Home Management;Canalith Repostioning;Cryotherapy;Iontophoresis '4mg'$ /ml Dexamethasone;Moist Heat;Gait training;Stair training;Functional mobility training;Therapeutic activities;Therapeutic exercise;Balance training;Neuromuscular re-education;Patient/family education;Manual techniques;Passive range of motion;Dry needling;Splinting;Vestibular    PT Next Visit Plan Instruct in balance training and LE strengthening    PT Home Exercise Plan 09/14/2021=Access Code: [facility]  URL: [email or web address]; no updates    Consulted and Agree with Plan of Care Patient               Particia Lather, PT 11/15/2021, 3:09 PM

## 2021-11-18 ENCOUNTER — Ambulatory Visit: Payer: Medicare Other

## 2021-11-19 ENCOUNTER — Ambulatory Visit: Payer: Medicare Other | Admitting: Occupational Therapy

## 2021-11-22 ENCOUNTER — Encounter: Payer: Self-pay | Admitting: Occupational Therapy

## 2021-11-22 ENCOUNTER — Ambulatory Visit: Payer: Medicare Other | Admitting: Occupational Therapy

## 2021-11-22 DIAGNOSIS — M6281 Muscle weakness (generalized): Secondary | ICD-10-CM

## 2021-11-22 DIAGNOSIS — M79641 Pain in right hand: Secondary | ICD-10-CM

## 2021-11-22 DIAGNOSIS — R2681 Unsteadiness on feet: Secondary | ICD-10-CM | POA: Diagnosis not present

## 2021-11-22 NOTE — Therapy (Signed)
OUTPATIENT PHYSICAL THERAPY TREATMENT NOTE/RECERT   Patient Name: Tracie Fisher MRN: [id number] DOB:08/24/1935, 86 y.o., female Today's Date: 11/23/2021  PCP: Dr. Benita Stabile REFERRING PROVIDER: Dr. Gurney Maxin   PT End of Session - 11/23/21 1637     Visit Number 23    Number of Visits 13    Date for PT Re-Evaluation 02/15/22    Authorization Time Period 09/02/2021- 11/25/2021    Progress Note Due on Visit 90    PT Start Time 1640    PT Stop Time 1725    PT Time Calculation (min) 45 min    Equipment Utilized During Treatment Gait belt    Activity Tolerance Patient tolerated treatment well    Behavior During Therapy WFL for tasks assessed/performed               Past Medical History:  Diagnosis Date   Cancer ([place])    SKIN   Complication of anesthesia    Depression    Dysrhythmia    GERD (gastroesophageal reflux disease)    Hiatal hernia    HOH (hard of hearing)    Hypertension    Hypothyroidism    PONV (postoperative nausea and vomiting)    Thyroid disease    Past Surgical History:  Procedure Laterality Date   APPENDECTOMY     BACK SURGERY     MULTIPLE/ROD IN BACK   BREAST CYST ASPIRATION Left "years ago"   CATARACT EXTRACTION W/PHACO Left 03/21/2017   Procedure: CATARACT EXTRACTION PHACO AND INTRAOCULAR LENS PLACEMENT ([place]);  Surgeon: Birder Robson, MD;  Location: [facility];  Service: Ophthalmology;  Laterality: Left;  [place] 00:48 AP% 17.8 CDE 8.57 Fluid pack lot # [id number]   CATARACT EXTRACTION W/PHACO Right 04/11/2017   Procedure: CATARACT EXTRACTION PHACO AND INTRAOCULAR LENS PLACEMENT ([facility]);  Surgeon: Birder Robson, MD;  Location: [facility];  Service: Ophthalmology;  Laterality: Right;  [place] 01:09.5 AP% 15.8 CDE 11.04 Fluid Pack lot # [id number]   CHOLECYSTECTOMY     COLON SURGERY     BOWEL OBSTRUCTION   COLONOSCOPY WITH PROPOFOL N/A 04/25/2016   Procedure: COLONOSCOPY WITH PROPOFOL;  Surgeon: Jonathon Bellows, MD;  Location: [facility];  Service:  Endoscopy;  Laterality: N/A;   ESOPHAGOGASTRODUODENOSCOPY (EGD) WITH PROPOFOL N/A 04/25/2016   Procedure: ESOPHAGOGASTRODUODENOSCOPY (EGD) WITH PROPOFOL;  Surgeon: Jonathon Bellows, MD;  Location: [facility];  Service: Endoscopy;  Laterality: N/A;   EXPLORATORY LAPAROTOMY  10/18/2011   blockage small intestines   GIVENS CAPSULE STUDY N/A 05/11/2016   Procedure: GIVENS CAPSULE STUDY;  Surgeon: Jonathon Bellows, MD;  Location: [facility];  Service: Endoscopy;  Laterality: N/A;   Patient Active Problem List   Diagnosis Date Noted   Swelling of limb 03/05/2019   Pain in limb 03/05/2019   History of stroke 03/04/2019   Polyneuropathy 07/31/2018   Bilateral foot pain 03/06/2018   Chronic bilateral low back pain 03/06/2018   Neck pain 03/06/2018   Neuropathy 02/06/2018   History of nonmelanoma skin cancer 09/27/2016   Essential hypertension 09/02/2016   Hiatal hernia    Stricture of esophagus    Absolute anemia    Iron deficiency anemia due to chronic blood loss 03/23/2016   Generalized abdominal pain 03/23/2016   History of tachycardia 07/29/2015   SOB (shortness of breath) on exertion 07/29/2015   BCC (basal cell carcinoma), eyelid 02/11/2013    REFERRING DIAG:  Polyneuropathy, unspecified  R26.81 (ICD-10-CM) - Unsteadiness on feet    THERAPY DIAG:  Muscle weakness (generalized)  Unsteadiness on  feet  Difficulty in walking, not elsewhere classified  Abnormality of gait and mobility  Rationale for Evaluation and Treatment Rehabilitation  PERTINENT HISTORY: Patient presents with referral for polyneuropathy/unsteadiness on feet. She endorses past medical history significant for HTN, skin cancer, Neuropathy with bilateral foot pain, Chronic back pain, OA.  PRECAUTIONS: falls  SUBJECTIVE: Patient continues to have foot pain limiting her standing and walking, came down in chair.   PAIN:  Are you having pain? Yes: NPRS scale: 9/10 Pain location: L heel   Pain description: dull  ache  Aggravating factors: standing Relieving factors: rest     TODAY'S TREATMENT:  Goals: HEP  FOTO BERG: 49/56  5xSTS: 13.46 sec TUG: 10 seconds 10 MWT: 13 seconds without AD   Therex:    - Seated hip march- 4lb. AW- x 10 reps -alt LE -Seated LAQ 4lb AW- 10 reps alt LE  -seated alternating ER/IR 10x each LE with #4lb ankle weight -seated heel raise #4lb 15x  -seated adduction ball squeeze 10x -seated LAQ with adduction ball between feet 10x   Car transfer with CGA       Education provided throughout session via VC/TC and demonstration to facilitate movement at target joints and correct muscle activation for all testing and exercises performed.       PATIENT EDUCATION: Education details: Exercise technique Person educated: Patient Education method: Explanation, Demonstration, Tactile cues, and Verbal cues Education comprehension: verbalized understanding, returned demonstration, verbal cues required, tactile cues required, and needs further education   HOME EXERCISE PROGRAM: No updates this visit   PT Short Term Goals -      PT SHORT TERM GOAL #1   Title Pt will be independent with initial HEP in order to improve strength and balance in order to decrease fall risk and improve function at home and work.    Baseline 09/02/2021= No formal HEP in place; 10/05/2021- Patient reports walking with walker at home and performing sitting exercises. She reports no questions with today.    Time 6    Period Weeks    Status Achieved    Target Date 10/14/21              PT Long Term Goals -        PT LONG TERM GOAL #1   Title Pt will be independent with final HEP in order to improve strength and balance in order to decrease fall risk and improve function at home and work.    Baseline 09/02/2021= No formal HEP in place 7/11 compliance   Time 12    Period Weeks    Status MET   Target Date 02/15/2022       PT LONG TERM GOAL #2   Title Pt will improve FOTO to  target score of 53 to display perceived improvements in ability to complete ADL's    Baseline 09/02/2021= 50; 10/05/2021=62 7/11: 61%   Time 12    Period Weeks    Status MET    Target Date 02/15/2022       PT LONG TERM GOAL #3   Title Pt will improve BERG by at least 10 points in order to demonstrate clinically significant improvement in balance.    Baseline 09/02/2021= 42/56; 10/05/2021=48/56 7/11: 49/56    Time 12    Period Weeks    Status MET/Progressed to 10 points from 5   Target Date 02/15/2022       PT LONG TERM GOAL #4   Title Pt will decrease 5TSTS  by at least 6 seconds in order to demonstrate clinically significant improvement in LE strength.    Baseline 09/02/2021= 22.63 sec without UE support; 10/05/2021= 17.85 sec without UE Support  7/11: 13.46    Time 12    Period Weeks    Status MET   Target Date 02/01/2022       PT LONG TERM GOAL #5   Title Pt will decrease TUG to below 14 seconds/decrease in order to demonstrate decreased fall risk.    Baseline 09/02/2021= 17.9 sec without an AD; 10/05/2021= 16.31 sec without an AD 7/11: 10 seconds   Time 12    Period Weeks    Status MET   Target Date 02/15/2022       PT LONG TERM GOAL #6   Title Pt will increase 10MWT by at least 0.2 m/s in order to demonstrate clinically significant improvement in community ambulation    Baseline 09/02/2021=0.56 m/s without an AD; 10/05/2021= 0.62 m/s 7/11: 0.76 m/s without AD    Time 12    Period Weeks    Status On-going    Target Date 02/15/2022         PT LONG TERM GOAL #7  Title  Patient will increase Functional Gait Assessment score to >20/30 as to reduce fall risk and improve dynamic gait safety with community ambulation.  Baseline 7/11: perform next session   Time 12   Period Weeks   Status New  Target Date 02/15/2022           PT LONG TERM GOAL #6  Title Patient will increase six minute walk test distance to >1000 for progression to community ambulator and improve gait  ability  Baseline 7/11: perform next session  Time 12   Period Weeks   Status New  Target Date 02/15/2022          Plan    Clinical Impression Statement Patient has met and made significant progress towards functional goals. Her gait speed and stability has improved. Her transfers and mobility in general is progressing but not yet to safe independent community mobility. Patient is limited in tests due to severe L foot pain; will benefit from FGA and 6 min walk test next session. Patient will continue to benefit with skilled physical therapy to improve her lower extremity strength, balance, and mobility.   Personal Factors and Comorbidities Comorbidity 2    Comorbidities HTN, OA    Examination-Activity Limitations Bend;Caring for Others;Carry;Lift;Squat;Stairs;Transfers;Stand    Examination-Participation Restrictions Community Activity;Shop;Yard Work    [employer] officer Evolving/Moderate complexity    Rehab Potential Good    PT Frequency 2x / week    PT Duration 12 weeks    PT Treatment/Interventions ADLs/Self Care Home Management;Canalith Repostioning;Cryotherapy;Iontophoresis 4mg /ml Dexamethasone;Moist Heat;Gait training;Stair training;Functional mobility training;Therapeutic activities;Therapeutic exercise;Balance training;Neuromuscular re-education;Patient/family education;Manual techniques;Passive range of motion;Dry needling;Splinting;Vestibular    PT Next Visit Plan FGA, 6 min walk test    PT Home Exercise Plan 09/14/2021=Access Code: [facility]  URL: [email or web address]; no updates    Consulted and Agree with Plan of Care Patient               Janna Arch, PT 11/23/2021, 5:39 PM

## 2021-11-22 NOTE — Therapy (Signed)
[facility] [address], [place], [address] Phone: [phone]   Fax:  [phone]  Occupational Therapy Evaluation  Patient Details  Name: Tracie Fisher MRN: [id number] Date of Birth: September 04, 1935 Referring Provider (OT): [name]   Encounter Date: 11/22/2021   OT End of Session - 11/22/21 1946     Visit Number 1    Number of Visits 4    Date for OT Re-Evaluation 01/17/22    OT Start Time 1502    OT Stop Time 1600    OT Time Calculation (min) 58 min    Activity Tolerance Patient tolerated treatment well    Behavior During Therapy [facility] for tasks assessed/performed             Past Medical History:  Diagnosis Date   Cancer ([place])    SKIN   Complication of anesthesia    Depression    Dysrhythmia    GERD (gastroesophageal reflux disease)    Hiatal hernia    HOH (hard of hearing)    Hypertension    Hypothyroidism    PONV (postoperative nausea and vomiting)    Thyroid disease     Past Surgical History:  Procedure Laterality Date   APPENDECTOMY     BACK SURGERY     MULTIPLE/ROD IN BACK   BREAST CYST ASPIRATION Left "years ago"   CATARACT EXTRACTION W/PHACO Left 03/21/2017   Procedure: CATARACT EXTRACTION PHACO AND INTRAOCULAR LENS PLACEMENT ([place]);  Surgeon: Birder Robson, MD;  Location: [facility];  Service: Ophthalmology;  Laterality: Left;  [place] 00:48 AP% 17.8 CDE 8.57 Fluid pack lot # [id number]   CATARACT EXTRACTION W/PHACO Right 04/11/2017   Procedure: CATARACT EXTRACTION PHACO AND INTRAOCULAR LENS PLACEMENT ([facility]);  Surgeon: Birder Robson, MD;  Location: [facility];  Service: Ophthalmology;  Laterality: Right;  [place] 01:09.5 AP% 15.8 CDE 11.04 Fluid Pack lot # [id number]   CHOLECYSTECTOMY     COLON SURGERY     BOWEL OBSTRUCTION   COLONOSCOPY WITH PROPOFOL N/A 04/25/2016   Procedure: COLONOSCOPY WITH PROPOFOL;  Surgeon: Jonathon Bellows, MD;  Location: [facility];  Service: Endoscopy;   Laterality: N/A;   ESOPHAGOGASTRODUODENOSCOPY (EGD) WITH PROPOFOL N/A 04/25/2016   Procedure: ESOPHAGOGASTRODUODENOSCOPY (EGD) WITH PROPOFOL;  Surgeon: Jonathon Bellows, MD;  Location: [facility];  Service: Endoscopy;  Laterality: N/A;   EXPLORATORY LAPAROTOMY  10/18/2011   blockage small intestines   GIVENS CAPSULE STUDY N/A 05/11/2016   Procedure: GIVENS CAPSULE STUDY;  Surgeon: Jonathon Bellows, MD;  Location: [facility];  Service: Endoscopy;  Laterality: N/A;    There were no vitals filed for this visit.   Subjective Assessment - 11/22/21 1937     Subjective  I brought this soft splint at the pharmacy but it does not help.  Do you have this 1 that the doctor gave me a picture of.  My thumb hurts when I am trying to write my checks, putting her bra on or pulling up the sheets in bed at night.  As well as making the bed or doing the laundry or vacuuming.  Sitting the pain is about 6 but it can increase to 8-10/10 when trying to use it.    Pertinent History DR [name] not 10/28/21 -86 year old, female, S/P re-do open RCTR in the OR on 04/06/21. Complete resolution of the symptoms.     She reports attending OT at [facility]. She reports sharp, intermittent, pain at the base of the R thumb particularly when writing.  She notes that a thumb spica splint helps with the pain. The patient reports 3 prior injections at the R 1st CMCJ (2 at [facility], 1 with our clinic on 07/19/21). She denies numbness, tingling, nocturnal symptoms    Patient Stated Goals Can you help me getting the right splint so that I can use my hand with less pain with my thumb.  I do not want surgery.    Currently in Pain? Yes    Pain Score 6     Pain Location --   thumb   Pain Orientation Right    Pain Descriptors / Indicators Aching;Tender;Sore    Pain Type Chronic pain    Pain Onset More than a month ago    Pain Frequency Intermittent    Aggravating Factors  Gripping, pushing, pulling, pinching               [facility] OT Assessment -  11/22/21 0001       Assessment   Medical Diagnosis Right first CMCJ OA with pain    Referring Provider (OT) [name]    Onset Date/Surgical Date 04/06/21    Hand Dominance Right    Prior Therapy Hand therapy at [facility] carpal tunnel release      Prior Function   Vocation Retired    Leisure Sit in swing, Oceanographer, do house work but cannot do with her thumb pain anymore,      Strength   Right Hand Grip (lbs) 10   CMC neoprene 17 lbs   Right Hand Lateral Pinch 2.5 lbs   CMC neoprene 4 lbs   Right Hand 3 Point Pinch 3 lbs   CMC neoprene 5 lbs   Left Hand Grip (lbs) 31    Left Hand Lateral Pinch 9 lbs    Left Hand 3 Point Pinch 8 lbs      Right Hand AROM   R Thumb Radial ABduction/ADduction 0-55 50    R Thumb Palmar ABduction/ADduction 0-45 50    R Thumb Opposition to Index --   To fifth but collapses with CMC into carpal tunnel, Hyperflex at [facility] unable to flex at IP             Splint fitting for [facility] neoprene to use with activities during day that cause pain  Thumb spica night time  for night time or during day to rest thumb  Review some joint protection -but would need more info next session  Pt to use splints for 3 wks  Pt ed on donning and doffing as well as wearing                   OT Education - 11/22/21 1946     Education Details Findings of evaluation as well as splint fitting and wearing, joint protection    Person(s) Educated Patient    Methods Explanation;Demonstration;Tactile cues;Verbal cues;Handout    Comprehension Verbal cues required;Returned demonstration;Verbalized understanding                 OT Long Term Goals - 11/22/21 1959       OT LONG TERM GOAL #1   Title Patient to be independent and wearing of thumb CMC neoprene as well as thumb spica to decrease pain with functional use to less than 5/10 pain    Baseline Pain increases to 6/10 at rest and can increase per pt with writing and pinching to 8-10/10 at thumb [facility]    Time 4     Status New  Target Date 12/20/21      OT LONG TERM GOAL #2   Title Patient show increased grip and prehension strength in right dominant hand with use of splints to be able to write longer as well as do some of her housecleaning, cutting food and pulling up sheets with less pain    Baseline Grip 10 pounds, lateral pinch 2-1/2 pounds and three-point pinch 3 pounds.  At eval with CMC neoprene grip increased to 17 and lateral pinch to 4 pounds, three-point pinch to 5 pounds.    Time 8    Period Weeks    Status New    Target Date 01/17/22                   Plan - 11/22/21 1947     Clinical Impression Statement Patient referred to OT for evaluation for right dominant hand thumb 1st CMCJ OA with pain.  Patient had a carpal tunnel release done in November 22.  Patient report continues to have pain 6-10/10 with use of right dominant hand especially with using thumb.  Limiting by pain with writing, making the bed, pulling the sheets up at night, vacuuming, dressing as well as laundry.  Patient arrived with soft wrist wrap but report no helpful pain.  Physician ordered patient thumb spica.  Upon assessing patient's active range of motion as well as grip and prehension strength-patient show atrophy of thenar eminence of thumb.  Patient is able to do radial and palmar abduction of thumb but unable to do three-point pinch or lateral grip with stability at proximal CMC.  Patient hyperflexed with MC with extension at IP and collapsing with [facility] into carpal tunnel.  Patient was assessed with a CMC neoprene splint that provided patient with increase of grip strength of 7 pounds as well as nearly doubled her prehension grip with less pain.  Patient to wear CMC neoprene wrap during the day with activities that causes pain to the [facility] to provide her with functional strengthening with less pain.  Thumb spica prefab was fitted for patient to use at night or when needed with heavy activity to rest the thumb CMC.   Patient can benefit from continued OT services to assess use of splint to decrease pain and increased grip and prehension.  As well as education of joint protection and adaptive equipment to increase independence in ADLs and IADLs as well as decreasing pain.    OT Occupational Profile and History Problem Focused Assessment - Including review of records relating to presenting problem    Occupational performance deficits (Please refer to evaluation for details): ADL's;IADL's;Play;Leisure;Social Participation    Body Structure / Function / Physical Skills ADL;Strength;Decreased knowledge of use of DME;Dexterity;Pain;UE functional use;IADL;Flexibility;FMC    Rehab Potential Fair    Clinical Decision Making Limited treatment options, no task modification necessary    Comorbidities Affecting Occupational Performance: May have comorbidities impacting occupational performance    Modification or Assistance to Complete Evaluation  No modification of tasks or assist necessary to complete eval    OT Frequency Biweekly   to monthly -dep progress   OT Duration 8 weeks    OT Treatment/Interventions Self-care/ADL training;Paraffin;Patient/family education;Manual Therapy;Therapeutic exercise;Splinting;DME and/or AE instruction    Consulted and Agree with Plan of Care Patient             Patient will benefit from skilled therapeutic intervention in order to improve the following deficits and impairments:   Body Structure / Function / Physical Skills: ADL, Strength,  Decreased knowledge of use of DME, Dexterity, Pain, UE functional use, IADL, Flexibility, [employer]       Visit Diagnosis: Pain in right hand  Muscle weakness (generalized)    Problem List Patient Active Problem List   Diagnosis Date Noted   Swelling of limb 03/05/2019   Pain in limb 03/05/2019   History of stroke 03/04/2019   Polyneuropathy 07/31/2018   Bilateral foot pain 03/06/2018   Chronic bilateral low back pain 03/06/2018   Neck  pain 03/06/2018   Neuropathy 02/06/2018   History of nonmelanoma skin cancer 09/27/2016   Essential hypertension 09/02/2016   Hiatal hernia    Stricture of esophagus    Absolute anemia    Iron deficiency anemia due to chronic blood loss 03/23/2016   Generalized abdominal pain 03/23/2016   History of tachycardia 07/29/2015   SOB (shortness of breath) on exertion 07/29/2015   BCC (basal cell carcinoma), eyelid 02/11/2013    Rosalyn Gess, OTR/L;CLT 11/22/2021, 8:10 PM  [facility] [address], [place], [address] Phone: [phone]   Fax:  [phone]  Name: Tracie Fisher MRN: [id number] Date of Birth: 10-12-1935

## 2021-11-23 ENCOUNTER — Ambulatory Visit: Payer: Medicare Other

## 2021-11-23 DIAGNOSIS — M6281 Muscle weakness (generalized): Secondary | ICD-10-CM

## 2021-11-23 DIAGNOSIS — R269 Unspecified abnormalities of gait and mobility: Secondary | ICD-10-CM

## 2021-11-23 DIAGNOSIS — R262 Difficulty in walking, not elsewhere classified: Secondary | ICD-10-CM

## 2021-11-23 DIAGNOSIS — R2681 Unsteadiness on feet: Secondary | ICD-10-CM | POA: Diagnosis not present

## 2021-11-25 ENCOUNTER — Ambulatory Visit: Payer: Medicare Other

## 2021-11-25 DIAGNOSIS — R2681 Unsteadiness on feet: Secondary | ICD-10-CM

## 2021-11-25 DIAGNOSIS — M6281 Muscle weakness (generalized): Secondary | ICD-10-CM

## 2021-11-25 DIAGNOSIS — R262 Difficulty in walking, not elsewhere classified: Secondary | ICD-10-CM

## 2021-11-25 DIAGNOSIS — R269 Unspecified abnormalities of gait and mobility: Secondary | ICD-10-CM

## 2021-11-25 NOTE — Therapy (Addendum)
OUTPATIENT PHYSICAL THERAPY TREATMENT NOTE   Patient Name: Tracie Fisher MRN: [id number] DOB:April 18, 1936, 86 y.o., female Today's Date: 11/25/2021  PCP: Dr. Benita Stabile REFERRING PROVIDER: Dr. Gurney Maxin   PT End of Session - 11/25/21 1236     Visit Number 24    Number of Visits 25    Date for PT Re-Evaluation 02/15/22    Authorization Time Period 09/02/2021- 11/25/2021    Progress Note Due on Visit 38    PT Start Time 1259    PT Stop Time 1344    PT Time Calculation (min) 45 min    Equipment Utilized During Treatment Gait belt    Activity Tolerance Patient tolerated treatment well    Behavior During Therapy WFL for tasks assessed/performed               Past Medical History:  Diagnosis Date   Cancer ([place])    SKIN   Complication of anesthesia    Depression    Dysrhythmia    GERD (gastroesophageal reflux disease)    Hiatal hernia    HOH (hard of hearing)    Hypertension    Hypothyroidism    PONV (postoperative nausea and vomiting)    Thyroid disease    Past Surgical History:  Procedure Laterality Date   APPENDECTOMY     BACK SURGERY     MULTIPLE/ROD IN BACK   BREAST CYST ASPIRATION Left "years ago"   CATARACT EXTRACTION W/PHACO Left 03/21/2017   Procedure: CATARACT EXTRACTION PHACO AND INTRAOCULAR LENS PLACEMENT ([place]);  Surgeon: Birder Robson, MD;  Location: [facility];  Service: Ophthalmology;  Laterality: Left;  [place] 00:48 AP% 17.8 CDE 8.57 Fluid pack lot # [id number]   CATARACT EXTRACTION W/PHACO Right 04/11/2017   Procedure: CATARACT EXTRACTION PHACO AND INTRAOCULAR LENS PLACEMENT ([facility]);  Surgeon: Birder Robson, MD;  Location: [facility];  Service: Ophthalmology;  Laterality: Right;  [place] 01:09.5 AP% 15.8 CDE 11.04 Fluid Pack lot # [id number]   CHOLECYSTECTOMY     COLON SURGERY     BOWEL OBSTRUCTION   COLONOSCOPY WITH PROPOFOL N/A 04/25/2016   Procedure: COLONOSCOPY WITH PROPOFOL;  Surgeon: Jonathon Bellows, MD;  Location: [facility];  Service:  Endoscopy;  Laterality: N/A;   ESOPHAGOGASTRODUODENOSCOPY (EGD) WITH PROPOFOL N/A 04/25/2016   Procedure: ESOPHAGOGASTRODUODENOSCOPY (EGD) WITH PROPOFOL;  Surgeon: Jonathon Bellows, MD;  Location: [facility];  Service: Endoscopy;  Laterality: N/A;   EXPLORATORY LAPAROTOMY  10/18/2011   blockage small intestines   GIVENS CAPSULE STUDY N/A 05/11/2016   Procedure: GIVENS CAPSULE STUDY;  Surgeon: Jonathon Bellows, MD;  Location: [facility];  Service: Endoscopy;  Laterality: N/A;   Patient Active Problem List   Diagnosis Date Noted   Swelling of limb 03/05/2019   Pain in limb 03/05/2019   History of stroke 03/04/2019   Polyneuropathy 07/31/2018   Bilateral foot pain 03/06/2018   Chronic bilateral low back pain 03/06/2018   Neck pain 03/06/2018   Neuropathy 02/06/2018   History of nonmelanoma skin cancer 09/27/2016   Essential hypertension 09/02/2016   Hiatal hernia    Stricture of esophagus    Absolute anemia    Iron deficiency anemia due to chronic blood loss 03/23/2016   Generalized abdominal pain 03/23/2016   History of tachycardia 07/29/2015   SOB (shortness of breath) on exertion 07/29/2015   BCC (basal cell carcinoma), eyelid 02/11/2013    REFERRING DIAG:  Polyneuropathy, unspecified  R26.81 (ICD-10-CM) - Unsteadiness on feet    THERAPY DIAG:  Muscle weakness (generalized)  Unsteadiness on  feet  Difficulty in walking, not elsewhere classified  Abnormality of gait and mobility  Rationale for Evaluation and Treatment Rehabilitation  PERTINENT HISTORY: Patient presents with referral for polyneuropathy/unsteadiness on feet. She endorses past medical history significant for HTN, skin cancer, Neuropathy with bilateral foot pain, Chronic back pain, OA.  PRECAUTIONS: falls  SUBJECTIVE: Patient reports her L foot continues to bother her. Is supposed to get new orthotics on Monday.   PAIN:  Are you having pain? Yes: NPRS scale: 4/10 Pain location: Right  Pain description:  ache Aggravating factors: any right hand movement Relieving factors: rest     TODAY'S TREATMENT:   BP at start of session seated: 100/58  BP with exercise: 121/54   Therex:    - 6" step toe taps with intermittent UE support x 10 reps alt LE - Seated hip march- 4lb. AW- x 10 reps -alt LE -Seated LAQ 4lb AW- 10 reps alt LE  -seated alternating ER/IR 10x each LE with #4lb ankle weight -seated heel raise #4lb 15x  -Seated hamstring BTB- x 12 reps - Sit to stand- Without UE support x 12 reps -seated LAQ with adduction ball between feet 10x  -seated adduction ball squeeze 10x 5 second holds   Neuromuscular re-ed:  -airex pad 6" step tandem stance 30 seconds each LE placement  -airex pad: visual and vertical head turns with dual task of cognitive game x8 minutes   -6 cones:     -weave between cones CGA 2x length    -chevrone pattern weaving CGA 2x length   Education provided throughout session via VC/TC and demonstration to facilitate movement at target joints and correct muscle activation for all testing and exercises performed.       PATIENT EDUCATION: Education details: Exercise technique Person educated: Patient Education method: Explanation, Demonstration, Tactile cues, and Verbal cues Education comprehension: verbalized understanding, returned demonstration, verbal cues required, tactile cues required, and needs further education   HOME EXERCISE PROGRAM: No updates this visit   PT Short Term Goals -      PT SHORT TERM GOAL #1   Title Pt will be independent with initial HEP in order to improve strength and balance in order to decrease fall risk and improve function at home and work.    Baseline 09/02/2021= No formal HEP in place; 10/05/2021- Patient reports walking with walker at home and performing sitting exercises. She reports no questions with today.    Time 6    Period Weeks    Status Achieved    Target Date 10/14/21              PT Long Term Goals -         PT LONG TERM GOAL #1   Title Pt will be independent with final HEP in order to improve strength and balance in order to decrease fall risk and improve function at home and work.    Baseline 09/02/2021= No formal HEP in place    Time 12    Period Weeks    Status New    Target Date 11/25/21      PT LONG TERM GOAL #2   Title Pt will improve FOTO to target score of 53 to display perceived improvements in ability to complete ADL's    Baseline 09/02/2021= 50; 10/05/2021=62    Time 12    Period Weeks    Status On-going    Target Date 11/25/21      PT LONG TERM GOAL #3  Title Pt will improve BERG by at least 5 points in order to demonstrate clinically significant improvement in balance.    Baseline 09/02/2021= 42/56; 10/05/2021=48/56    Time 12    Period Weeks    Status On-going    Target Date 11/25/21      PT LONG TERM GOAL #4   Title Pt will decrease 5TSTS by at least 6 seconds in order to demonstrate clinically significant improvement in LE strength.    Baseline 09/02/2021= 22.63 sec without UE support; 10/05/2021= 17.85 sec without UE Support    Time 12    Period Weeks    Status On-going    Target Date 11/25/21      PT LONG TERM GOAL #5   Title Pt will decrease TUG to below 14 seconds/decrease in order to demonstrate decreased fall risk.    Baseline 09/02/2021= 17.9 sec without an AD; 10/05/2021= 16.31 sec without an AD    Time 12    Period Weeks    Status On-going    Target Date 11/25/21      PT LONG TERM GOAL #6   Title Pt will increase 10MWT by at least 0.2 m/s in order to demonstrate clinically significant improvement in community ambulation    Baseline 09/02/2021=0.56 m/s without an AD; 10/05/2021= 0.62 m/s    Time 12    Period Weeks    Status On-going    Target Date 11/25/21              Plan    Clinical Impression Statement Patient presents with excellent motivation. Her L foot continues to bother her throughout session limiting session interventions. Patient  does require intermittent rest breaks from fatigue but remains highly motivated for progression of care. Patient would benefit from additional skiled PT Intervention to improve strength, balance and mobility    Personal Factors and Comorbidities Comorbidity 2    Comorbidities HTN, OA    Examination-Activity Limitations Bend;Caring for Others;Carry;Lift;Squat;Stairs;Transfers;Stand    Examination-Participation Restrictions Community Activity;Shop;Yard Work    [employer]chant [employer] officer Evolving/Moderate complexity    Rehab Potential Good    PT Frequency 2x / week    PT Duration 12 weeks    PT Treatment/Interventions ADLs/Self Care Home Management;Canalith Repostioning;Cryotherapy;Iontophoresis '4mg'$ /ml Dexamethasone;Moist Heat;Gait training;Stair training;Functional mobility training;Therapeutic activities;Therapeutic exercise;Balance training;Neuromuscular re-education;Patient/family education;Manual techniques;Passive range of motion;Dry needling;Splinting;Vestibular    PT Next Visit Plan Instruct in balance training and LE strengthening    PT Home Exercise Plan 09/14/2021=Access Code: [facility]  URL: [email or web address]; no updates    Consulted and Agree with Plan of Care Patient               Janna Arch, PT 11/25/2021, 1:46 PM

## 2021-11-30 ENCOUNTER — Ambulatory Visit: Payer: Medicare Other

## 2021-11-30 DIAGNOSIS — M6281 Muscle weakness (generalized): Secondary | ICD-10-CM

## 2021-11-30 DIAGNOSIS — R269 Unspecified abnormalities of gait and mobility: Secondary | ICD-10-CM

## 2021-11-30 DIAGNOSIS — R2681 Unsteadiness on feet: Secondary | ICD-10-CM

## 2021-11-30 DIAGNOSIS — R262 Difficulty in walking, not elsewhere classified: Secondary | ICD-10-CM

## 2021-11-30 NOTE — Therapy (Signed)
OUTPATIENT PHYSICAL THERAPY TREATMENT NOTE   Patient Name: Tracie Fisher MRN: [id number] DOB:1935/05/27, 86 y.o., female Today's Date: 11/30/2021  PCP: Dr. Benita Stabile REFERRING PROVIDER: Dr. Gurney Maxin   PT End of Session - 11/30/21 1427     Visit Number 25    Number of Visits 31    Date for PT Re-Evaluation 02/15/22    Authorization Time Period 09/02/2021- 11/25/2021    Progress Note Due on Visit 49    PT Start Time 1345    PT Stop Time 1429    PT Time Calculation (min) 44 min    Equipment Utilized During Treatment Gait belt    Activity Tolerance Patient tolerated treatment well    Behavior During Therapy WFL for tasks assessed/performed                Past Medical History:  Diagnosis Date   Cancer ([place])    SKIN   Complication of anesthesia    Depression    Dysrhythmia    GERD (gastroesophageal reflux disease)    Hiatal hernia    HOH (hard of hearing)    Hypertension    Hypothyroidism    PONV (postoperative nausea and vomiting)    Thyroid disease    Past Surgical History:  Procedure Laterality Date   APPENDECTOMY     BACK SURGERY     MULTIPLE/ROD IN BACK   BREAST CYST ASPIRATION Left "years ago"   CATARACT EXTRACTION W/PHACO Left 03/21/2017   Procedure: CATARACT EXTRACTION PHACO AND INTRAOCULAR LENS PLACEMENT ([place]);  Surgeon: Birder Robson, MD;  Location: [facility];  Service: Ophthalmology;  Laterality: Left;  [place] 00:48 AP% 17.8 CDE 8.57 Fluid pack lot # [id number]   CATARACT EXTRACTION W/PHACO Right 04/11/2017   Procedure: CATARACT EXTRACTION PHACO AND INTRAOCULAR LENS PLACEMENT ([facility]);  Surgeon: Birder Robson, MD;  Location: [facility];  Service: Ophthalmology;  Laterality: Right;  [place] 01:09.5 AP% 15.8 CDE 11.04 Fluid Pack lot # [id number]   CHOLECYSTECTOMY     COLON SURGERY     BOWEL OBSTRUCTION   COLONOSCOPY WITH PROPOFOL N/A 04/25/2016   Procedure: COLONOSCOPY WITH PROPOFOL;  Surgeon: Jonathon Bellows, MD;  Location: [facility];  Service:  Endoscopy;  Laterality: N/A;   ESOPHAGOGASTRODUODENOSCOPY (EGD) WITH PROPOFOL N/A 04/25/2016   Procedure: ESOPHAGOGASTRODUODENOSCOPY (EGD) WITH PROPOFOL;  Surgeon: Jonathon Bellows, MD;  Location: [facility];  Service: Endoscopy;  Laterality: N/A;   EXPLORATORY LAPAROTOMY  10/18/2011   blockage small intestines   GIVENS CAPSULE STUDY N/A 05/11/2016   Procedure: GIVENS CAPSULE STUDY;  Surgeon: Jonathon Bellows, MD;  Location: [facility];  Service: Endoscopy;  Laterality: N/A;   Patient Active Problem List   Diagnosis Date Noted   Swelling of limb 03/05/2019   Pain in limb 03/05/2019   History of stroke 03/04/2019   Polyneuropathy 07/31/2018   Bilateral foot pain 03/06/2018   Chronic bilateral low back pain 03/06/2018   Neck pain 03/06/2018   Neuropathy 02/06/2018   History of nonmelanoma skin cancer 09/27/2016   Essential hypertension 09/02/2016   Hiatal hernia    Stricture of esophagus    Absolute anemia    Iron deficiency anemia due to chronic blood loss 03/23/2016   Generalized abdominal pain 03/23/2016   History of tachycardia 07/29/2015   SOB (shortness of breath) on exertion 07/29/2015   BCC (basal cell carcinoma), eyelid 02/11/2013    REFERRING DIAG:  Polyneuropathy, unspecified  R26.81 (ICD-10-CM) - Unsteadiness on feet    THERAPY DIAG:  Muscle weakness (generalized)  Difficulty  in walking, not elsewhere classified  Unsteadiness on feet  Abnormality of gait and mobility  Rationale for Evaluation and Treatment Rehabilitation  PERTINENT HISTORY: Patient presents with referral for polyneuropathy/unsteadiness on feet. She endorses past medical history significant for HTN, skin cancer, Neuropathy with bilateral foot pain, Chronic back pain, OA.  PRECAUTIONS: falls  SUBJECTIVE: Pt reports her R hand is bothering her the most today.  Pt is scheduled to see Gwenette Greet for OT the first week of August.  Pt notes she got new orthotics yesterday and notes that it's causing her  feet to hurt a little bit more.   PAIN:  Are you having pain? Yes: NPRS scale: 4/10 Pain location: Right  Pain description: ache Aggravating factors: any right hand movement Relieving factors: rest     TODAY'S TREATMENT:   BP at start of session seated: 125/52  BP with exercise: 121/54    Therex:    - 6" step toe taps with intermittent UE support x 10 reps alt LE - Seated hip march- 4lb. AW- x 10 reps -alt LE -Seated LAQ 5lb AW- 10 reps alt LE  -seated alternating ER/IR 10x each LE with #4lb ankle weight -Seated heel raise 5lb 15x  -Sit to stand- Without UE support x 12 reps -Seated LAQ with adduction ball between feet 10x  -Seated adduction ball squeeze 10x 5 second holds -Seated hip abduction into physioball against therapist, 2x15   Neuromuscular re-ed:   -airex pad 6" step tandem stance 30 seconds each LE placement  -airex pad: visual and vertical head turns  -ambulation in hallway with head turns and calling out characters, x2 length   Education provided throughout session via VC/TC and demonstration to facilitate movement at target joints and correct muscle activation for all testing and exercises performed.       PATIENT EDUCATION: Education details: Exercise technique Person educated: Patient Education method: Explanation, Demonstration, Tactile cues, and Verbal cues Education comprehension: verbalized understanding, returned demonstration, verbal cues required, tactile cues required, and needs further education   HOME EXERCISE PROGRAM: No updates this visit   PT Short Term Goals -      PT SHORT TERM GOAL #1   Title Pt will be independent with initial HEP in order to improve strength and balance in order to decrease fall risk and improve function at home and work.    Baseline 09/02/2021= No formal HEP in place; 10/05/2021- Patient reports walking with walker at home and performing sitting exercises. She reports no questions with today.    Time 6     Period Weeks    Status Achieved    Target Date 10/14/21              PT Long Term Goals -        PT LONG TERM GOAL #1   Title Pt will be independent with final HEP in order to improve strength and balance in order to decrease fall risk and improve function at home and work.    Baseline 09/02/2021= No formal HEP in place    Time 12    Period Weeks    Status New    Target Date 11/25/21      PT LONG TERM GOAL #2   Title Pt will improve FOTO to target score of 53 to display perceived improvements in ability to complete ADL's    Baseline 09/02/2021= 50; 10/05/2021=62    Time 12    Period Weeks    Status On-going  Target Date 11/25/21      PT LONG TERM GOAL #3   Title Pt will improve BERG by at least 5 points in order to demonstrate clinically significant improvement in balance.    Baseline 09/02/2021= 42/56; 10/05/2021=48/56    Time 12    Period Weeks    Status On-going    Target Date 11/25/21      PT LONG TERM GOAL #4   Title Pt will decrease 5TSTS by at least 6 seconds in order to demonstrate clinically significant improvement in LE strength.    Baseline 09/02/2021= 22.63 sec without UE support; 10/05/2021= 17.85 sec without UE Support    Time 12    Period Weeks    Status On-going    Target Date 11/25/21      PT LONG TERM GOAL #5   Title Pt will decrease TUG to below 14 seconds/decrease in order to demonstrate decreased fall risk.    Baseline 09/02/2021= 17.9 sec without an AD; 10/05/2021= 16.31 sec without an AD    Time 12    Period Weeks    Status On-going    Target Date 11/25/21      PT LONG TERM GOAL #6   Title Pt will increase 10MWT by at least 0.2 m/s in order to demonstrate clinically significant improvement in community ambulation    Baseline 09/02/2021=0.56 m/s without an AD; 10/05/2021= 0.62 m/s    Time 12    Period Weeks    Status On-going    Target Date 11/25/21              Plan    Clinical Impression Statement Pt performed well with additional  weights and reps with prior exercises.  Pt also introduced to hip isometric exercises that pt notes to be beneficial and felt as though she was really working her legs well.  Pt to continue with current POC in order to benefit from skilled therapy in order to address remaining strength deficits in order to return to PLOF.   Personal Factors and Comorbidities Comorbidity 2    Comorbidities HTN, OA    Examination-Activity Limitations Bend;Caring for Others;Carry;Lift;Squat;Stairs;Transfers;Stand    Examination-Participation Restrictions Community Activity;Shop;Yard Work    [employer] officer Evolving/Moderate complexity    Rehab Potential Good    PT Frequency 2x / week    PT Duration 12 weeks    PT Treatment/Interventions ADLs/Self Care Home Management;Canalith Repostioning;Cryotherapy;Iontophoresis '4mg'$ /ml Dexamethasone;Moist Heat;Gait training;Stair training;Functional mobility training;Therapeutic activities;Therapeutic exercise;Balance training;Neuromuscular re-education;Patient/family education;Manual techniques;Passive range of motion;Dry needling;Splinting;Vestibular    PT Next Visit Plan Instruct in balance training and LE strengthening    PT Home Exercise Plan 09/14/2021=Access Code: [facility]  URL: [email or web address]; no updates    Consulted and Agree with Plan of Care Patient               Gwenlyn Saran, PT, DPT 11/30/21, 5:40 PM

## 2021-12-01 NOTE — Therapy (Signed)
OUTPATIENT PHYSICAL THERAPY TREATMENT NOTE   Patient Name: NAUREEN BENTON MRN: [id number] DOB:30-May-1935, 86 y.o., female Today's Date: 12/02/2021  PCP: Dr. Benita Stabile REFERRING PROVIDER: Dr. Gurney Maxin   PT End of Session - 12/02/21 1512     Visit Number 26    Number of Visits 54    Date for PT Re-Evaluation 02/15/22    Authorization Time Period 09/02/2021- 11/25/2021    Progress Note Due on Visit 64    PT Start Time 1515    PT Stop Time 1600    PT Time Calculation (min) 45 min    Equipment Utilized During Treatment Gait belt    Activity Tolerance Patient tolerated treatment well    Behavior During Therapy WFL for tasks assessed/performed                 Past Medical History:  Diagnosis Date   Cancer ([place])    SKIN   Complication of anesthesia    Depression    Dysrhythmia    GERD (gastroesophageal reflux disease)    Hiatal hernia    HOH (hard of hearing)    Hypertension    Hypothyroidism    PONV (postoperative nausea and vomiting)    Thyroid disease    Past Surgical History:  Procedure Laterality Date   APPENDECTOMY     BACK SURGERY     MULTIPLE/ROD IN BACK   BREAST CYST ASPIRATION Left "years ago"   CATARACT EXTRACTION W/PHACO Left 03/21/2017   Procedure: CATARACT EXTRACTION PHACO AND INTRAOCULAR LENS PLACEMENT ([place]);  Surgeon: Birder Robson, MD;  Location: [facility];  Service: Ophthalmology;  Laterality: Left;  [place] 00:48 AP% 17.8 CDE 8.57 Fluid pack lot # [id number]   CATARACT EXTRACTION W/PHACO Right 04/11/2017   Procedure: CATARACT EXTRACTION PHACO AND INTRAOCULAR LENS PLACEMENT ([facility]);  Surgeon: Birder Robson, MD;  Location: [facility];  Service: Ophthalmology;  Laterality: Right;  [place] 01:09.5 AP% 15.8 CDE 11.04 Fluid Pack lot # [id number]   CHOLECYSTECTOMY     COLON SURGERY     BOWEL OBSTRUCTION   COLONOSCOPY WITH PROPOFOL N/A 04/25/2016   Procedure: COLONOSCOPY WITH PROPOFOL;  Surgeon: Jonathon Bellows, MD;  Location: [facility];  Service:  Endoscopy;  Laterality: N/A;   ESOPHAGOGASTRODUODENOSCOPY (EGD) WITH PROPOFOL N/A 04/25/2016   Procedure: ESOPHAGOGASTRODUODENOSCOPY (EGD) WITH PROPOFOL;  Surgeon: Jonathon Bellows, MD;  Location: [facility];  Service: Endoscopy;  Laterality: N/A;   EXPLORATORY LAPAROTOMY  10/18/2011   blockage small intestines   GIVENS CAPSULE STUDY N/A 05/11/2016   Procedure: GIVENS CAPSULE STUDY;  Surgeon: Jonathon Bellows, MD;  Location: [facility];  Service: Endoscopy;  Laterality: N/A;   Patient Active Problem List   Diagnosis Date Noted   Swelling of limb 03/05/2019   Pain in limb 03/05/2019   History of stroke 03/04/2019   Polyneuropathy 07/31/2018   Bilateral foot pain 03/06/2018   Chronic bilateral low back pain 03/06/2018   Neck pain 03/06/2018   Neuropathy 02/06/2018   History of nonmelanoma skin cancer 09/27/2016   Essential hypertension 09/02/2016   Hiatal hernia    Stricture of esophagus    Absolute anemia    Iron deficiency anemia due to chronic blood loss 03/23/2016   Generalized abdominal pain 03/23/2016   History of tachycardia 07/29/2015   SOB (shortness of breath) on exertion 07/29/2015   BCC (basal cell carcinoma), eyelid 02/11/2013    REFERRING DIAG:  Polyneuropathy, unspecified  R26.81 (ICD-10-CM) - Unsteadiness on feet    THERAPY DIAG:  Muscle weakness (generalized)  Difficulty in walking, not elsewhere classified  Unsteadiness on feet  Rationale for Evaluation and Treatment Rehabilitation  PERTINENT HISTORY: Patient presents with referral for polyneuropathy/unsteadiness on feet. She endorses past medical history significant for HTN, skin cancer, Neuropathy with bilateral foot pain, Chronic back pain, OA.  PRECAUTIONS: falls  SUBJECTIVE: Patient reports she was walking to her trash can and was walking "weird".    PAIN:  Are you having pain? Yes: NPRS scale: 4/10 Pain location: Right  Pain description: ache Aggravating factors: any right hand  movement Relieving factors: rest     TODAY'S TREATMENT:   BP at start of session seated: 131/64    Therex:    3lb ankle weights: -LAQ 12x each LE hold 3 seconds  -seated march 12x each LE -seated abduction 12x each LE -seated heel raise 15x  -seated toe raise 15x   Seated adduction ball squeeze 15x Seated adduction ball with LAQ 12x   Ambulation with CGA and car transfer x 600 ft; occasional LOB when ambulating outside to car  Neuromuscular re-ed:  Airex balance beam:  -lateral stepping 4x length of // bars -tandem walking with UE support 4x length of // bars  Standing with CGA next to support surface:  Airex pad: static stand 30 seconds x 2 trials, noticeable trembling of ankles/LE's with fatigue and challenge to maintain stability Airex pad: horizontal head turns 30 seconds scanning room 10x ; cueing for arc of motion  Airex pad: vertical head turns 30 seconds, cueing for arc of motion, noticeable sway with upward gaze increasing demand on ankle righting reaction musculature Airex pad: one foot on 6" step one foot on airex pad, hold position for 30 seconds, switch legs, 2x each LE;    Education provided throughout session via VC/TC and demonstration to facilitate movement at target joints and correct muscle activation for all testing and exercises performed.       PATIENT EDUCATION: Education details: Exercise technique Person educated: Patient Education method: Explanation, Demonstration, Tactile cues, and Verbal cues Education comprehension: verbalized understanding, returned demonstration, verbal cues required, tactile cues required, and needs further education   HOME EXERCISE PROGRAM: No updates this visit   PT Short Term Goals -      PT SHORT TERM GOAL #1   Title Pt will be independent with initial HEP in order to improve strength and balance in order to decrease fall risk and improve function at home and work.    Baseline 09/02/2021= No formal HEP in  place; 10/05/2021- Patient reports walking with walker at home and performing sitting exercises. She reports no questions with today.    Time 6    Period Weeks    Status Achieved    Target Date 10/14/21              PT Long Term Goals -        PT LONG TERM GOAL #1   Title Pt will be independent with final HEP in order to improve strength and balance in order to decrease fall risk and improve function at home and work.    Baseline 09/02/2021= No formal HEP in place    Time 12    Period Weeks    Status New    Target Date 11/25/21      PT LONG TERM GOAL #2   Title Pt will improve FOTO to target score of 53 to display perceived improvements in ability to complete ADL's    Baseline 09/02/2021= 50; 10/05/2021=62  Time 12    Period Weeks    Status On-going    Target Date 11/25/21      PT LONG TERM GOAL #3   Title Pt will improve BERG by at least 5 points in order to demonstrate clinically significant improvement in balance.    Baseline 09/02/2021= 42/56; 10/05/2021=48/56    Time 12    Period Weeks    Status On-going    Target Date 11/25/21      PT LONG TERM GOAL #4   Title Pt will decrease 5TSTS by at least 6 seconds in order to demonstrate clinically significant improvement in LE strength.    Baseline 09/02/2021= 22.63 sec without UE support; 10/05/2021= 17.85 sec without UE Support    Time 12    Period Weeks    Status On-going    Target Date 11/25/21      PT LONG TERM GOAL #5   Title Pt will decrease TUG to below 14 seconds/decrease in order to demonstrate decreased fall risk.    Baseline 09/02/2021= 17.9 sec without an AD; 10/05/2021= 16.31 sec without an AD    Time 12    Period Weeks    Status On-going    Target Date 11/25/21      PT LONG TERM GOAL #6   Title Pt will increase 10MWT by at least 0.2 m/s in order to demonstrate clinically significant improvement in community ambulation    Baseline 09/02/2021=0.56 m/s without an AD; 10/05/2021= 0.62 m/s    Time 12    Period  Weeks    Status On-going    Target Date 11/25/21              Plan    Clinical Impression Statement Patient is challenged maintiainign stability with tandem positioning requiring intermittent UE support. Patient tolerates stability and strengthening interventions well with intermittent rest breaks. Patient requires assistance to return to car due to limited stability. Pt to continue with current POC in order to benefit from skilled therapy in order to address remaining strength deficits in order to return to PLOF.   Personal Factors and Comorbidities Comorbidity 2    Comorbidities HTN, OA    Examination-Activity Limitations Bend;Caring for Others;Carry;Lift;Squat;Stairs;Transfers;Stand    Examination-Participation Restrictions Community Activity;Shop;Yard Work    [employer] officer Evolving/Moderate complexity    Rehab Potential Good    PT Frequency 2x / week    PT Duration 12 weeks    PT Treatment/Interventions ADLs/Self Care Home Management;Canalith Repostioning;Cryotherapy;Iontophoresis '4mg'$ /ml Dexamethasone;Moist Heat;Gait training;Stair training;Functional mobility training;Therapeutic activities;Therapeutic exercise;Balance training;Neuromuscular re-education;Patient/family education;Manual techniques;Passive range of motion;Dry needling;Splinting;Vestibular    PT Next Visit Plan Instruct in balance training and LE strengthening    PT Home Exercise Plan 09/14/2021=Access Code: [facility]  URL: [email or web address]; no updates    Consulted and Agree with Plan of Care Patient               Janna Arch, PT, DPT  12/02/21, 4:44 PM

## 2021-12-02 ENCOUNTER — Ambulatory Visit: Payer: Medicare Other

## 2021-12-02 DIAGNOSIS — R262 Difficulty in walking, not elsewhere classified: Secondary | ICD-10-CM

## 2021-12-02 DIAGNOSIS — R2681 Unsteadiness on feet: Secondary | ICD-10-CM | POA: Diagnosis not present

## 2021-12-02 DIAGNOSIS — M6281 Muscle weakness (generalized): Secondary | ICD-10-CM

## 2021-12-06 NOTE — Therapy (Signed)
OUTPATIENT PHYSICAL THERAPY TREATMENT NOTE   Patient Name: Tracie Fisher MRN: [id number] DOB:03-16-1936, 86 y.o., female Today's Date: 12/07/2021  PCP: Dr. Benita Stabile REFERRING PROVIDER: Dr. Gurney Maxin   PT End of Session - 12/07/21 1336     Visit Number 27    Number of Visits 2    Date for PT Re-Evaluation 02/15/22    Authorization Time Period 09/02/2021- 11/25/2021    Progress Note Due on Visit 48    PT Start Time 1345    PT Stop Time 1429    PT Time Calculation (min) 44 min    Equipment Utilized During Treatment Gait belt    Activity Tolerance Patient tolerated treatment well    Behavior During Therapy WFL for tasks assessed/performed                  Past Medical History:  Diagnosis Date   Cancer ([place])    SKIN   Complication of anesthesia    Depression    Dysrhythmia    GERD (gastroesophageal reflux disease)    Hiatal hernia    HOH (hard of hearing)    Hypertension    Hypothyroidism    PONV (postoperative nausea and vomiting)    Thyroid disease    Past Surgical History:  Procedure Laterality Date   APPENDECTOMY     BACK SURGERY     MULTIPLE/ROD IN BACK   BREAST CYST ASPIRATION Left "years ago"   CATARACT EXTRACTION W/PHACO Left 03/21/2017   Procedure: CATARACT EXTRACTION PHACO AND INTRAOCULAR LENS PLACEMENT ([place]);  Surgeon: Birder Robson, MD;  Location: [facility];  Service: Ophthalmology;  Laterality: Left;  [place] 00:48 AP% 17.8 CDE 8.57 Fluid pack lot # [id number]   CATARACT EXTRACTION W/PHACO Right 04/11/2017   Procedure: CATARACT EXTRACTION PHACO AND INTRAOCULAR LENS PLACEMENT ([facility]);  Surgeon: Birder Robson, MD;  Location: [facility];  Service: Ophthalmology;  Laterality: Right;  [place] 01:09.5 AP% 15.8 CDE 11.04 Fluid Pack lot # [id number]   CHOLECYSTECTOMY     COLON SURGERY     BOWEL OBSTRUCTION   COLONOSCOPY WITH PROPOFOL N/A 04/25/2016   Procedure: COLONOSCOPY WITH PROPOFOL;  Surgeon: Jonathon Bellows, MD;  Location: [facility];  Service:  Endoscopy;  Laterality: N/A;   ESOPHAGOGASTRODUODENOSCOPY (EGD) WITH PROPOFOL N/A 04/25/2016   Procedure: ESOPHAGOGASTRODUODENOSCOPY (EGD) WITH PROPOFOL;  Surgeon: Jonathon Bellows, MD;  Location: [facility];  Service: Endoscopy;  Laterality: N/A;   EXPLORATORY LAPAROTOMY  10/18/2011   blockage small intestines   GIVENS CAPSULE STUDY N/A 05/11/2016   Procedure: GIVENS CAPSULE STUDY;  Surgeon: Jonathon Bellows, MD;  Location: [facility];  Service: Endoscopy;  Laterality: N/A;   Patient Active Problem List   Diagnosis Date Noted   Swelling of limb 03/05/2019   Pain in limb 03/05/2019   History of stroke 03/04/2019   Polyneuropathy 07/31/2018   Bilateral foot pain 03/06/2018   Chronic bilateral low back pain 03/06/2018   Neck pain 03/06/2018   Neuropathy 02/06/2018   History of nonmelanoma skin cancer 09/27/2016   Essential hypertension 09/02/2016   Hiatal hernia    Stricture of esophagus    Absolute anemia    Iron deficiency anemia due to chronic blood loss 03/23/2016   Generalized abdominal pain 03/23/2016   History of tachycardia 07/29/2015   SOB (shortness of breath) on exertion 07/29/2015   BCC (basal cell carcinoma), eyelid 02/11/2013    REFERRING DIAG:  Polyneuropathy, unspecified  R26.81 (ICD-10-CM) - Unsteadiness on feet    THERAPY DIAG:  Muscle weakness (generalized)  Difficulty in walking, not elsewhere classified  Unsteadiness on feet  Rationale for Evaluation and Treatment Rehabilitation  PERTINENT HISTORY: Patient presents with referral for polyneuropathy/unsteadiness on feet. She endorses past medical history significant for HTN, skin cancer, Neuropathy with bilateral foot pain, Chronic back pain, OA.  PRECAUTIONS: falls  SUBJECTIVE: Patient reports she was out and about all day doing errands. Her feet was bothering her after being up all day and trying out a new orthotic.    PAIN:  Are you having pain? Yes: NPRS scale: 4/10 Pain location: Right  Pain  description: ache Aggravating factors: any right hand movement Relieving factors: rest     TODAY'S TREATMENT:   BP at start of session seated: 131/64    Therex:    3lb ankle weights: -LAQ 12x each LE hold 3 seconds  -seated march 12x each LE -seated heel raise 15x  -seated toe raise 15x   Seated adduction ball squeeze 15x Seated adduction ball with LAQ 12x  Hamstring isometric pressing into dynadisc 10x 3 second holds each LE   Ambulate 160 ft, super set with 10 sit to stands x 2 trials ; seated rest break between sets  Neuromuscular re-ed:    Standing with CGA next to support surface:  Airex pad: visual scans with reaching inside/outside BOS to sort letters by color x 5 minutes Airex pad: one foot on 6" step one foot on airex pad, hold position for 30 seconds, switch legs, 2x each LE;  Seated balloon taps for coordination with pertubation's and reaction timing x 3 minutes  Education provided throughout session via VC/TC and demonstration to facilitate movement at target joints and correct muscle activation for all testing and exercises performed.       PATIENT EDUCATION: Education details: Exercise technique Person educated: Patient Education method: Explanation, Demonstration, Tactile cues, and Verbal cues Education comprehension: verbalized understanding, returned demonstration, verbal cues required, tactile cues required, and needs further education   HOME EXERCISE PROGRAM: No updates this visit   PT Short Term Goals -      PT SHORT TERM GOAL #1   Title Pt will be independent with initial HEP in order to improve strength and balance in order to decrease fall risk and improve function at home and work.    Baseline 09/02/2021= No formal HEP in place; 10/05/2021- Patient reports walking with walker at home and performing sitting exercises. She reports no questions with today.    Time 6    Period Weeks    Status Achieved    Target Date 10/14/21               PT Long Term Goals -        PT LONG TERM GOAL #1   Title Pt will be independent with final HEP in order to improve strength and balance in order to decrease fall risk and improve function at home and work.    Baseline 09/02/2021= No formal HEP in place    Time 12    Period Weeks    Status New    Target Date 11/25/21      PT LONG TERM GOAL #2   Title Pt will improve FOTO to target score of 53 to display perceived improvements in ability to complete ADL's    Baseline 09/02/2021= 50; 10/05/2021=62    Time 12    Period Weeks    Status On-going    Target Date 11/25/21      PT LONG TERM GOAL #3  Title Pt will improve BERG by at least 5 points in order to demonstrate clinically significant improvement in balance.    Baseline 09/02/2021= 42/56; 10/05/2021=48/56    Time 12    Period Weeks    Status On-going    Target Date 11/25/21      PT LONG TERM GOAL #4   Title Pt will decrease 5TSTS by at least 6 seconds in order to demonstrate clinically significant improvement in LE strength.    Baseline 09/02/2021= 22.63 sec without UE support; 10/05/2021= 17.85 sec without UE Support    Time 12    Period Weeks    Status On-going    Target Date 11/25/21      PT LONG TERM GOAL #5   Title Pt will decrease TUG to below 14 seconds/decrease in order to demonstrate decreased fall risk.    Baseline 09/02/2021= 17.9 sec without an AD; 10/05/2021= 16.31 sec without an AD    Time 12    Period Weeks    Status On-going    Target Date 11/25/21      PT LONG TERM GOAL #6   Title Pt will increase 10MWT by at least 0.2 m/s in order to demonstrate clinically significant improvement in community ambulation    Baseline 09/02/2021=0.56 m/s without an AD; 10/05/2021= 0.62 m/s    Time 12    Period Weeks    Status On-going    Target Date 11/25/21              Plan    Clinical Impression Statement Patient presents with excellent motivation throughout physical therapy session. Reaching while on unstable  surface tolerated well. Patient is challenged with prolonged ambulation and muscle recruitment patterning with shortness of breath and fatigue.  Pt to continue with current POC in order to benefit from skilled therapy in order to address remaining strength deficits in order to return to PLOF.   Personal Factors and Comorbidities Comorbidity 2    Comorbidities HTN, OA    Examination-Activity Limitations Bend;Caring for Others;Carry;Lift;Squat;Stairs;Transfers;Stand    Examination-Participation Restrictions Community Activity;Shop;Yard Work    [employer] officer Evolving/Moderate complexity    Rehab Potential Good    PT Frequency 2x / week    PT Duration 12 weeks    PT Treatment/Interventions ADLs/Self Care Home Management;Canalith Repostioning;Cryotherapy;Iontophoresis '4mg'$ /ml Dexamethasone;Moist Heat;Gait training;Stair training;Functional mobility training;Therapeutic activities;Therapeutic exercise;Balance training;Neuromuscular re-education;Patient/family education;Manual techniques;Passive range of motion;Dry needling;Splinting;Vestibular    PT Next Visit Plan Instruct in balance training and LE strengthening    PT Home Exercise Plan 09/14/2021=Access Code: [facility]  URL: [email or web address]; no updates    Consulted and Agree with Plan of Care Patient               Janna Arch, PT, DPT  12/07/21, 2:30 PM

## 2021-12-07 ENCOUNTER — Ambulatory Visit: Payer: Medicare Other

## 2021-12-07 DIAGNOSIS — R2681 Unsteadiness on feet: Secondary | ICD-10-CM | POA: Diagnosis not present

## 2021-12-07 DIAGNOSIS — M6281 Muscle weakness (generalized): Secondary | ICD-10-CM

## 2021-12-07 DIAGNOSIS — R262 Difficulty in walking, not elsewhere classified: Secondary | ICD-10-CM

## 2021-12-09 ENCOUNTER — Ambulatory Visit: Payer: Medicare Other

## 2021-12-09 DIAGNOSIS — M6281 Muscle weakness (generalized): Secondary | ICD-10-CM

## 2021-12-09 DIAGNOSIS — R262 Difficulty in walking, not elsewhere classified: Secondary | ICD-10-CM

## 2021-12-09 DIAGNOSIS — R2681 Unsteadiness on feet: Secondary | ICD-10-CM | POA: Diagnosis not present

## 2021-12-09 NOTE — Therapy (Signed)
OUTPATIENT PHYSICAL THERAPY TREATMENT NOTE   Patient Name: Tracie Fisher MRN: [id number] DOB:03/28/1936, 86 y.o., female Today's Date: 12/09/2021  PCP: Dr. Benita Stabile REFERRING PROVIDER: Dr. Gurney Maxin   PT End of Session - 12/09/21 1351     Visit Number 28    Number of Visits 50    Date for PT Re-Evaluation 02/15/22    Authorization Time Period 09/02/2021- 11/25/2021    Progress Note Due on Visit 2    PT Start Time 1353    PT Stop Time 1428    PT Time Calculation (min) 35 min    Equipment Utilized During Treatment Gait belt    Activity Tolerance Patient tolerated treatment well    Behavior During Therapy WFL for tasks assessed/performed                   Past Medical History:  Diagnosis Date   Cancer ([place])    SKIN   Complication of anesthesia    Depression    Dysrhythmia    GERD (gastroesophageal reflux disease)    Hiatal hernia    HOH (hard of hearing)    Hypertension    Hypothyroidism    PONV (postoperative nausea and vomiting)    Thyroid disease    Past Surgical History:  Procedure Laterality Date   APPENDECTOMY     BACK SURGERY     MULTIPLE/ROD IN BACK   BREAST CYST ASPIRATION Left "years ago"   CATARACT EXTRACTION W/PHACO Left 03/21/2017   Procedure: CATARACT EXTRACTION PHACO AND INTRAOCULAR LENS PLACEMENT ([place]);  Surgeon: Birder Robson, MD;  Location: [facility];  Service: Ophthalmology;  Laterality: Left;  [place] 00:48 AP% 17.8 CDE 8.57 Fluid pack lot # [id number]   CATARACT EXTRACTION W/PHACO Right 04/11/2017   Procedure: CATARACT EXTRACTION PHACO AND INTRAOCULAR LENS PLACEMENT ([facility]);  Surgeon: Birder Robson, MD;  Location: [facility];  Service: Ophthalmology;  Laterality: Right;  [place] 01:09.5 AP% 15.8 CDE 11.04 Fluid Pack lot # [id number]   CHOLECYSTECTOMY     COLON SURGERY     BOWEL OBSTRUCTION   COLONOSCOPY WITH PROPOFOL N/A 04/25/2016   Procedure: COLONOSCOPY WITH PROPOFOL;  Surgeon: Jonathon Bellows, MD;  Location: [facility];  Service:  Endoscopy;  Laterality: N/A;   ESOPHAGOGASTRODUODENOSCOPY (EGD) WITH PROPOFOL N/A 04/25/2016   Procedure: ESOPHAGOGASTRODUODENOSCOPY (EGD) WITH PROPOFOL;  Surgeon: Jonathon Bellows, MD;  Location: [facility];  Service: Endoscopy;  Laterality: N/A;   EXPLORATORY LAPAROTOMY  10/18/2011   blockage small intestines   GIVENS CAPSULE STUDY N/A 05/11/2016   Procedure: GIVENS CAPSULE STUDY;  Surgeon: Jonathon Bellows, MD;  Location: [facility];  Service: Endoscopy;  Laterality: N/A;   Patient Active Problem List   Diagnosis Date Noted   Swelling of limb 03/05/2019   Pain in limb 03/05/2019   History of stroke 03/04/2019   Polyneuropathy 07/31/2018   Bilateral foot pain 03/06/2018   Chronic bilateral low back pain 03/06/2018   Neck pain 03/06/2018   Neuropathy 02/06/2018   History of nonmelanoma skin cancer 09/27/2016   Essential hypertension 09/02/2016   Hiatal hernia    Stricture of esophagus    Absolute anemia    Iron deficiency anemia due to chronic blood loss 03/23/2016   Generalized abdominal pain 03/23/2016   History of tachycardia 07/29/2015   SOB (shortness of breath) on exertion 07/29/2015   BCC (basal cell carcinoma), eyelid 02/11/2013    REFERRING DIAG:  Polyneuropathy, unspecified  R26.81 (ICD-10-CM) - Unsteadiness on feet    THERAPY DIAG:  Muscle weakness (  generalized)  Difficulty in walking, not elsewhere classified  Rationale for Evaluation and Treatment Rehabilitation  PERTINENT HISTORY: Patient presents with referral for polyneuropathy/unsteadiness on feet. She endorses past medical history significant for HTN, skin cancer, Neuropathy with bilateral foot pain, Chronic back pain, OA.  PRECAUTIONS: falls  SUBJECTIVE: Pt reports R thumb is still "bad," wearing splint. Pt reports no new stumbles/falls. No other pain, her feet are feeling better today. Reports her BP "sort of goes up and down." Pt reports there are no other current concerns or issues.    PAIN:  Are  you having pain? Yes: NPRS scale: not rated/10 Pain location: Right  Pain description: ache Aggravating factors: any right hand movement Relieving factors: rest     TODAY'S TREATMENT:   12/09/2021   BP at start of session seated: 118/65 mmHg HR 52 bpm  Therex:    3lb ankle weights: -LAQ 15x each LE hold 2-3 seconds Pt reports somewhat challenging on LLE -seated march 15x each LE -seated heel raise 20x  -seated toe raise 20x   Seated adduction ball squeeze 20x Seated adduction ball with LAQ 16x   Ambulate 160 ft, super set with 12 sit to stands x 2 trials ; seated rest break between sets  Alt LE LAQ without weight 20x    PATIENT EDUCATION: Education details: Exercise technique, body mechanics Person educated: Patient Education method: Explanation, Demonstration, Tactile cues, and Verbal cues Education comprehension: verbalized understanding, returned demonstration, verbal cues required, tactile cues required, and needs further education   HOME EXERCISE PROGRAM: No updates this visit, pt to continue HEP as previously given   PT Short Term Goals -      PT SHORT TERM GOAL #1   Title Pt will be independent with initial HEP in order to improve strength and balance in order to decrease fall risk and improve function at home and work.    Baseline 09/02/2021= No formal HEP in place; 10/05/2021- Patient reports walking with walker at home and performing sitting exercises. She reports no questions with today.    Time 6    Period Weeks    Status Achieved    Target Date 10/14/21              PT Long Term Goals -        PT LONG TERM GOAL #1   Title Pt will be independent with final HEP in order to improve strength and balance in order to decrease fall risk and improve function at home and work.    Baseline 09/02/2021= No formal HEP in place    Time 12    Period Weeks    Status New    Target Date 11/25/21      PT LONG TERM GOAL #2   Title Pt will improve FOTO to  target score of 53 to display perceived improvements in ability to complete ADL's    Baseline 09/02/2021= 50; 10/05/2021=62    Time 12    Period Weeks    Status On-going    Target Date 11/25/21      PT LONG TERM GOAL #3   Title Pt will improve BERG by at least 5 points in order to demonstrate clinically significant improvement in balance.    Baseline 09/02/2021= 42/56; 10/05/2021=48/56    Time 12    Period Weeks    Status On-going    Target Date 11/25/21      PT LONG TERM GOAL #4   Title Pt will decrease 5TSTS by  at least 6 seconds in order to demonstrate clinically significant improvement in LE strength.    Baseline 09/02/2021= 22.63 sec without UE support; 10/05/2021= 17.85 sec without UE Support    Time 12    Period Weeks    Status On-going    Target Date 11/25/21      PT LONG TERM GOAL #5   Title Pt will decrease TUG to below 14 seconds/decrease in order to demonstrate decreased fall risk.    Baseline 09/02/2021= 17.9 sec without an AD; 10/05/2021= 16.31 sec without an AD    Time 12    Period Weeks    Status On-going    Target Date 11/25/21      PT LONG TERM GOAL #6   Title Pt will increase 10MWT by at least 0.2 m/s in order to demonstrate clinically significant improvement in community ambulation    Baseline 09/02/2021=0.56 m/s without an AD; 10/05/2021= 0.62 m/s    Time 12    Period Weeks    Status On-going    Target Date 11/25/21              Plan    Clinical Impression Statement Patient continues to present with excellent motivation throughout physical therapy session. She tolerates interventions well without shortness of breath and without pain. Pt able to increase number of reps performed with all strengthening therex. She did require brief seated rest breaks between sets of superset intervention and exhibited decreased gait speed. Pt to continue with current POC in order to benefit from skilled therapy in order to address remaining strength deficits in order to return  to PLOF.   Personal Factors and Comorbidities Comorbidity 2    Comorbidities HTN, OA    Examination-Activity Limitations Bend;Caring for Others;Carry;Lift;Squat;Stairs;Transfers;Stand    Examination-Participation Restrictions Community Activity;Shop;Yard Work    [employer] officer Evolving/Moderate complexity    Rehab Potential Good    PT Frequency 2x / week    PT Duration 12 weeks    PT Treatment/Interventions ADLs/Self Care Home Management;Canalith Repostioning;Cryotherapy;Iontophoresis '4mg'$ /ml Dexamethasone;Moist Heat;Gait training;Stair training;Functional mobility training;Therapeutic activities;Therapeutic exercise;Balance training;Neuromuscular re-education;Patient/family education;Manual techniques;Passive range of motion;Dry needling;Splinting;Vestibular    PT Next Visit Plan Instruct in balance training and LE strengthening    PT Home Exercise Plan 09/14/2021=Access Code: [facility]  URL: [email or web address]; no updates    Consulted and Agree with Plan of Care Patient             Ricard Dillon PT, DPT     12/09/21, 3:31 PM

## 2021-12-13 NOTE — Therapy (Signed)
OUTPATIENT PHYSICAL THERAPY TREATMENT NOTE   Patient Name: Tracie Fisher MRN: [id number] DOB:10-28-1935, 86 y.o., female Today's Date: 12/14/2021  PCP: Dr. Benita Stabile REFERRING PROVIDER: Dr. Gurney Maxin   PT End of Session - 12/14/21 1421     Visit Number 29    Number of Visits 72    Date for PT Re-Evaluation 02/15/22    Authorization Time Period 09/02/2021- 11/25/2021    Progress Note Due on Visit 62    PT Start Time 1429    PT Stop Time 4098    PT Time Calculation (min) 45 min    Equipment Utilized During Treatment Gait belt    Activity Tolerance Patient tolerated treatment well    Behavior During Therapy WFL for tasks assessed/performed                    Past Medical History:  Diagnosis Date   Cancer ([place])    SKIN   Complication of anesthesia    Depression    Dysrhythmia    GERD (gastroesophageal reflux disease)    Hiatal hernia    HOH (hard of hearing)    Hypertension    Hypothyroidism    PONV (postoperative nausea and vomiting)    Thyroid disease    Past Surgical History:  Procedure Laterality Date   APPENDECTOMY     BACK SURGERY     MULTIPLE/ROD IN BACK   BREAST CYST ASPIRATION Left "years ago"   CATARACT EXTRACTION W/PHACO Left 03/21/2017   Procedure: CATARACT EXTRACTION PHACO AND INTRAOCULAR LENS PLACEMENT ([place]);  Surgeon: Birder Robson, MD;  Location: [facility];  Service: Ophthalmology;  Laterality: Left;  [place] 00:48 AP% 17.8 CDE 8.57 Fluid pack lot # [id number]   CATARACT EXTRACTION W/PHACO Right 04/11/2017   Procedure: CATARACT EXTRACTION PHACO AND INTRAOCULAR LENS PLACEMENT ([facility]);  Surgeon: Birder Robson, MD;  Location: [facility];  Service: Ophthalmology;  Laterality: Right;  [place] 01:09.5 AP% 15.8 CDE 11.04 Fluid Pack lot # [id number]   CHOLECYSTECTOMY     COLON SURGERY     BOWEL OBSTRUCTION   COLONOSCOPY WITH PROPOFOL N/A 04/25/2016   Procedure: COLONOSCOPY WITH PROPOFOL;  Surgeon: Jonathon Bellows, MD;  Location: [facility];  Service:  Endoscopy;  Laterality: N/A;   ESOPHAGOGASTRODUODENOSCOPY (EGD) WITH PROPOFOL N/A 04/25/2016   Procedure: ESOPHAGOGASTRODUODENOSCOPY (EGD) WITH PROPOFOL;  Surgeon: Jonathon Bellows, MD;  Location: [facility];  Service: Endoscopy;  Laterality: N/A;   EXPLORATORY LAPAROTOMY  10/18/2011   blockage small intestines   GIVENS CAPSULE STUDY N/A 05/11/2016   Procedure: GIVENS CAPSULE STUDY;  Surgeon: Jonathon Bellows, MD;  Location: [facility];  Service: Endoscopy;  Laterality: N/A;   Patient Active Problem List   Diagnosis Date Noted   Swelling of limb 03/05/2019   Pain in limb 03/05/2019   History of stroke 03/04/2019   Polyneuropathy 07/31/2018   Bilateral foot pain 03/06/2018   Chronic bilateral low back pain 03/06/2018   Neck pain 03/06/2018   Neuropathy 02/06/2018   History of nonmelanoma skin cancer 09/27/2016   Essential hypertension 09/02/2016   Hiatal hernia    Stricture of esophagus    Absolute anemia    Iron deficiency anemia due to chronic blood loss 03/23/2016   Generalized abdominal pain 03/23/2016   History of tachycardia 07/29/2015   SOB (shortness of breath) on exertion 07/29/2015   BCC (basal cell carcinoma), eyelid 02/11/2013    REFERRING DIAG:  Polyneuropathy, unspecified  R26.81 (ICD-10-CM) - Unsteadiness on feet    THERAPY DIAG:  Muscle  weakness (generalized)  Difficulty in walking, not elsewhere classified  Unsteadiness on feet  Rationale for Evaluation and Treatment Rehabilitation  PERTINENT HISTORY: Patient presents with referral for polyneuropathy/unsteadiness on feet. She endorses past medical history significant for HTN, skin cancer, Neuropathy with bilateral foot pain, Chronic back pain, OA.  PRECAUTIONS: falls  SUBJECTIVE: Patient reports her heel is very painful with walking limiting her ability to stand. Patient has appointment with podiatrist on the 16th.     PAIN:  Are you having pain? Yes: NPRS scale: 7-8/10 Pain location: Right  Pain  description: ache Aggravating factors: any right hand movement Relieving factors: rest     TODAY'S TREATMENT:        Therex:   Nustep Lvl 2: Seat position 6; 4 minutes for LE strengthening.   3lb ankle weights:seated;  -LAQ 15x each LE x2 sets  -seated march 15x each LE; x 2 sets  -seated heel raise 15x  -seated toe raise 15x    Seated adduction ball squeeze 20x Seated adduction ball with LAQ 12x  TrA activation pressing into swiss ball 15x 3 second holds Hamstring isometric pressing into dynadisc 10x 3 second holds each LE   Seated on dynadisc: -medial lateral weight shift 10x -anterior posterior weight shift 8x; requires min A hand held support        Education provided throughout session via VC/TC and demonstration to facilitate movement at target joints and correct muscle activation for all testing and exercises performed.        PATIENT EDUCATION: Education details: Exercise technique, Economist Person educated: Patient Education method: Explanation, Demonstration, Tactile cues, and Verbal cues Education comprehension: verbalized understanding, returned demonstration, verbal cues required, tactile cues required, and needs further education   HOME EXERCISE PROGRAM: No updates this visit, pt to continue HEP as previously given   PT Short Term Goals -      PT SHORT TERM GOAL #1   Title Pt will be independent with initial HEP in order to improve strength and balance in order to decrease fall risk and improve function at home and work.    Baseline 09/02/2021= No formal HEP in place; 10/05/2021- Patient reports walking with walker at home and performing sitting exercises. She reports no questions with today.    Time 6    Period Weeks    Status Achieved    Target Date 10/14/21              PT Long Term Goals -        PT LONG TERM GOAL #1   Title Pt will be independent with final HEP in order to improve strength and balance in order to decrease  fall risk and improve function at home and work.    Baseline 09/02/2021= No formal HEP in place    Time 12    Period Weeks    Status New    Target Date 11/25/21      PT LONG TERM GOAL #2   Title Pt will improve FOTO to target score of 53 to display perceived improvements in ability to complete ADL's    Baseline 09/02/2021= 50; 10/05/2021=62    Time 12    Period Weeks    Status On-going    Target Date 11/25/21      PT LONG TERM GOAL #3   Title Pt will improve BERG by at least 5 points in order to demonstrate clinically significant improvement in balance.    Baseline 09/02/2021= 42/56; 10/05/2021=48/56  Time 12    Period Weeks    Status On-going    Target Date 11/25/21      PT LONG TERM GOAL #4   Title Pt will decrease 5TSTS by at least 6 seconds in order to demonstrate clinically significant improvement in LE strength.    Baseline 09/02/2021= 22.63 sec without UE support; 10/05/2021= 17.85 sec without UE Support    Time 12    Period Weeks    Status On-going    Target Date 11/25/21      PT LONG TERM GOAL #5   Title Pt will decrease TUG to below 14 seconds/decrease in order to demonstrate decreased fall risk.    Baseline 09/02/2021= 17.9 sec without an AD; 10/05/2021= 16.31 sec without an AD    Time 12    Period Weeks    Status On-going    Target Date 11/25/21      PT LONG TERM GOAL #6   Title Pt will increase 10MWT by at least 0.2 m/s in order to demonstrate clinically significant improvement in community ambulation    Baseline 09/02/2021=0.56 m/s without an AD; 10/05/2021= 0.62 m/s    Time 12    Period Weeks    Status On-going    Target Date 11/25/21              Plan    Clinical Impression Statement Patient session limited by foot pain resulting in entire session being seated and non weightbearing.  Patient has limited strength in core and would benefit from continued focus strengthening interventions.  Pt to continue with current POC in order to benefit from skilled  therapy in order to address remaining strength deficits in order to return to PLOF.   Personal Factors and Comorbidities Comorbidity 2    Comorbidities HTN, OA    Examination-Activity Limitations Bend;Caring for Others;Carry;Lift;Squat;Stairs;Transfers;Stand    Examination-Participation Restrictions Community Activity;Shop;Yard Work    Merchant navy officer Evolving/Moderate complexity    Rehab Potential Good    PT Frequency 2x / week    PT Duration 12 weeks    PT Treatment/Interventions ADLs/Self Care Home Management;Canalith Repostioning;Cryotherapy;Iontophoresis '4mg'$ /ml Dexamethasone;Moist Heat;Gait training;Stair training;Functional mobility training;Therapeutic activities;Therapeutic exercise;Balance training;Neuromuscular re-education;Patient/family education;Manual techniques;Passive range of motion;Dry needling;Splinting;Vestibular    PT Next Visit Plan Instruct in balance training and LE strengthening    PT Home Exercise Plan 09/14/2021=Access Code: [facility]  URL: [email or web address]; no updates    Consulted and Agree with Plan of Care Patient             Janna Arch PT      12/14/21, 3:15 PM

## 2021-12-14 ENCOUNTER — Ambulatory Visit: Payer: Medicare Other | Attending: Neurology

## 2021-12-14 DIAGNOSIS — R278 Other lack of coordination: Secondary | ICD-10-CM | POA: Diagnosis present

## 2021-12-14 DIAGNOSIS — R262 Difficulty in walking, not elsewhere classified: Secondary | ICD-10-CM | POA: Diagnosis present

## 2021-12-14 DIAGNOSIS — M79641 Pain in right hand: Secondary | ICD-10-CM | POA: Diagnosis present

## 2021-12-14 DIAGNOSIS — M6281 Muscle weakness (generalized): Secondary | ICD-10-CM | POA: Diagnosis present

## 2021-12-14 DIAGNOSIS — R269 Unspecified abnormalities of gait and mobility: Secondary | ICD-10-CM | POA: Insufficient documentation

## 2021-12-14 DIAGNOSIS — R2681 Unsteadiness on feet: Secondary | ICD-10-CM | POA: Diagnosis present

## 2021-12-15 NOTE — Therapy (Signed)
OUTPATIENT PHYSICAL THERAPY TREATMENT NOTE/Physical Therapy Progress Note   Dates of reporting period  11/09/21   to   12/15/21    Patient Name: Tracie Fisher MRN: [id number] DOB:1936-01-19, 86 y.o., female Today's Date: 12/16/2021  PCP: Dr. Benita Stabile REFERRING PROVIDER: Dr. Gurney Maxin   PT End of Session - 12/16/21 1427     Visit Number 30    Number of Visits 57    Date for PT Re-Evaluation 02/15/22    Authorization Time Period 09/02/2021- 11/25/2021    Progress Note Due on Visit 63    PT Start Time 1430    PT Stop Time 0177    PT Time Calculation (min) 44 min    Equipment Utilized During Treatment Gait belt    Activity Tolerance Patient tolerated treatment well    Behavior During Therapy WFL for tasks assessed/performed                     Past Medical History:  Diagnosis Date   Cancer ([place])    SKIN   Complication of anesthesia    Depression    Dysrhythmia    GERD (gastroesophageal reflux disease)    Hiatal hernia    HOH (hard of hearing)    Hypertension    Hypothyroidism    PONV (postoperative nausea and vomiting)    Thyroid disease    Past Surgical History:  Procedure Laterality Date   APPENDECTOMY     BACK SURGERY     MULTIPLE/ROD IN BACK   BREAST CYST ASPIRATION Left "years ago"   CATARACT EXTRACTION W/PHACO Left 03/21/2017   Procedure: CATARACT EXTRACTION PHACO AND INTRAOCULAR LENS PLACEMENT ([place]);  Surgeon: Birder Robson, MD;  Location: [facility];  Service: Ophthalmology;  Laterality: Left;  [place] 00:48 AP% 17.8 CDE 8.57 Fluid pack lot # [id number]   CATARACT EXTRACTION W/PHACO Right 04/11/2017   Procedure: CATARACT EXTRACTION PHACO AND INTRAOCULAR LENS PLACEMENT ([facility]);  Surgeon: Birder Robson, MD;  Location: [facility];  Service: Ophthalmology;  Laterality: Right;  [place] 01:09.5 AP% 15.8 CDE 11.04 Fluid Pack lot # [id number]   CHOLECYSTECTOMY     COLON SURGERY     BOWEL OBSTRUCTION   COLONOSCOPY WITH PROPOFOL N/A 04/25/2016   Procedure:  COLONOSCOPY WITH PROPOFOL;  Surgeon: Jonathon Bellows, MD;  Location: [facility];  Service: Endoscopy;  Laterality: N/A;   ESOPHAGOGASTRODUODENOSCOPY (EGD) WITH PROPOFOL N/A 04/25/2016   Procedure: ESOPHAGOGASTRODUODENOSCOPY (EGD) WITH PROPOFOL;  Surgeon: Jonathon Bellows, MD;  Location: [facility];  Service: Endoscopy;  Laterality: N/A;   EXPLORATORY LAPAROTOMY  10/18/2011   blockage small intestines   GIVENS CAPSULE STUDY N/A 05/11/2016   Procedure: GIVENS CAPSULE STUDY;  Surgeon: Jonathon Bellows, MD;  Location: [facility];  Service: Endoscopy;  Laterality: N/A;   Patient Active Problem List   Diagnosis Date Noted   Swelling of limb 03/05/2019   Pain in limb 03/05/2019   History of stroke 03/04/2019   Polyneuropathy 07/31/2018   Bilateral foot pain 03/06/2018   Chronic bilateral low back pain 03/06/2018   Neck pain 03/06/2018   Neuropathy 02/06/2018   History of nonmelanoma skin cancer 09/27/2016   Essential hypertension 09/02/2016   Hiatal hernia    Stricture of esophagus    Absolute anemia    Iron deficiency anemia due to chronic blood loss 03/23/2016   Generalized abdominal pain 03/23/2016   History of tachycardia 07/29/2015   SOB (shortness of breath) on exertion 07/29/2015   BCC (basal cell carcinoma), eyelid 02/11/2013  REFERRING DIAG:  Polyneuropathy, unspecified  R26.81 (ICD-10-CM) - Unsteadiness on feet    THERAPY DIAG:  Muscle weakness (generalized)  Difficulty in walking, not elsewhere classified  Unsteadiness on feet  Rationale for Evaluation and Treatment Rehabilitation  PERTINENT HISTORY: Patient presents with referral for polyneuropathy/unsteadiness on feet. She endorses past medical history significant for HTN, skin cancer, Neuropathy with bilateral foot pain, Chronic back pain, OA.  PRECAUTIONS: falls  SUBJECTIVE: Patient reports her heel is very painful with walking limiting her ability to stand. Patient has appointment with podiatrist on the 16th.      PAIN:  Are you having pain? Yes: NPRS scale: 7-8/10 Pain location: Right  Pain description: ache Aggravating factors: any right hand movement Relieving factors: rest     TODAY'S TREATMENT:  Perform goals: see below:  Goals: 10 MWT 11.5 seconds first trial; 11 seconds second trial  TUG: 9.28 m/s FOTO: 55%  BERG: 51/56  5xSTS: 14.15 seconds   Add new goal:  6 min walk test: 285 ft no AD      Seated 6" step toe taps 30 seconds  Lateral step over hedgehog and back 10x each LE   PATIENT EDUCATION: Education details: Exercise technique, body mechanics Person educated: Patient Education method: Explanation, Demonstration, Tactile cues, and Verbal cues Education comprehension: verbalized understanding, returned demonstration, verbal cues required, tactile cues required, and needs further education   HOME EXERCISE PROGRAM: No updates this visit, pt to continue HEP as previously given   PT Short Term Goals -      PT SHORT TERM GOAL #1   Title Pt will be independent with initial HEP in order to improve strength and balance in order to decrease fall risk and improve function at home and work.    Baseline 09/02/2021= No formal HEP in place; 10/05/2021- Patient reports walking with walker at home and performing sitting exercises. She reports no questions with today.    Time 6    Period Weeks    Status Achieved    Target Date 10/14/21              PT Long Term Goals -        PT LONG TERM GOAL #1   Title Pt will be independent with final HEP in order to improve strength and balance in order to decrease fall risk and improve function at home and work.    Baseline 09/02/2021= No formal HEP in place 8/3: compliant   Time 12    Period Weeks    Status On going   Target Date 02/15/22      PT LONG TERM GOAL #2   Title Pt will improve FOTO to target score of 53 to display perceived improvements in ability to complete ADL's    Baseline 09/02/2021= 50; 10/05/2021=62  8/3:  55%   Time 12    Period Weeks    Status On-going    Target Date 02/15/22     PT LONG TERM GOAL #3   Title Pt will improve BERG by at least 5 points in order to demonstrate clinically significant improvement in balance.    Baseline 09/02/2021= 42/56; 10/05/2021=48/56 8/3: 51/56   Time 12    Period Weeks    Status On-going    Target Date 02/15/22     PT LONG TERM GOAL #4   Title Pt will decrease 5TSTS by at least 6 seconds in order to demonstrate clinically significant improvement in LE strength.    Baseline 09/02/2021= 22.63 sec  without UE support; 10/05/2021= 17.85 sec without UE Support  8/3: 14.15 seconds no UE support    Time 12    Period Weeks    Status MET   Target Date 02/15/22     PT LONG TERM GOAL #5   Title Pt will decrease TUG to below 14 seconds/decrease in order to demonstrate decreased fall risk.    Baseline 09/02/2021= 17.9 sec without an AD; 10/05/2021= 16.31 sec without an AD  8/3: 9.3 seconds no AD    Time 12    Period Weeks    Status MET   Target Date 02/15/22     PT LONG TERM GOAL #6   Title Pt will increase 10MWT by at least 0.2 m/s in order to demonstrate clinically significant improvement in community ambulation    Baseline 09/02/2021=0.56 m/s without an AD; 10/05/2021= 0.62 m/s 8/3: 0.9 m/s    Time 12    Period Weeks    Status On-going    Target Date 02/15/22   PT LONG TERM GOAL #  Title Patient will increase six minute walk test distance to >1000 for progression to community ambulator and improve gait ability  Baseline 8/3:  3 min 30 seconds with 285 ft no AD   Time 12   Period Weeks   Status NEW  Target Date 02/15/22              Plan    Clinical Impression Statement Patient has met multiple goals and made significant progress towards functional goals. Her gait speed and transfers have improved. New goal focusing on her 6 minute walk speed for ambulation capacity. Patient's condition has the potential to improve in response to therapy. Maximum  improvement is yet to be obtained. The anticipated improvement is attainable and reasonable in a generally predictable time.    Pt to continue with current POC in order to benefit from skilled therapy in order to address remaining strength deficits in order to return to PLOF.   Personal Factors and Comorbidities Comorbidity 2    Comorbidities HTN, OA    Examination-Activity Limitations Bend;Caring for Others;Carry;Lift;Squat;Stairs;Transfers;Stand    Examination-Participation Restrictions Community Activity;Shop;Yard Work    Merchant navy officer Evolving/Moderate complexity    Rehab Potential Good    PT Frequency 2x / week    PT Duration 12 weeks    PT Treatment/Interventions ADLs/Self Care Home Management;Canalith Repostioning;Cryotherapy;Iontophoresis 4mg /ml Dexamethasone;Moist Heat;Gait training;Stair training;Functional mobility training;Therapeutic activities;Therapeutic exercise;Balance training;Neuromuscular re-education;Patient/family education;Manual techniques;Passive range of motion;Dry needling;Splinting;Vestibular    PT Next Visit Plan Instruct in balance training and LE strengthening    PT Home Exercise Plan 09/14/2021=Access Code: [facility]  URL: [email or web address]; no updates    Consulted and Agree with Plan of Care Patient             Janna Arch PT      12/16/21, 3:16 PM

## 2021-12-16 ENCOUNTER — Ambulatory Visit: Payer: Medicare Other

## 2021-12-16 DIAGNOSIS — R2681 Unsteadiness on feet: Secondary | ICD-10-CM

## 2021-12-16 DIAGNOSIS — M6281 Muscle weakness (generalized): Secondary | ICD-10-CM | POA: Diagnosis not present

## 2021-12-16 DIAGNOSIS — R262 Difficulty in walking, not elsewhere classified: Secondary | ICD-10-CM

## 2021-12-20 ENCOUNTER — Ambulatory Visit: Payer: Medicare Other | Admitting: Occupational Therapy

## 2021-12-20 DIAGNOSIS — M79641 Pain in right hand: Secondary | ICD-10-CM

## 2021-12-20 DIAGNOSIS — M6281 Muscle weakness (generalized): Secondary | ICD-10-CM

## 2021-12-20 DIAGNOSIS — R278 Other lack of coordination: Secondary | ICD-10-CM

## 2021-12-20 NOTE — Therapy (Unsigned)
OUTPATIENT PHYSICAL THERAPY TREATMENT NOTE   Patient Name: Tracie Fisher MRN: [id number] DOB:1936-01-26, 86 y.o., female Today's Date: 12/21/2021  PCP: Dr. Benita Stabile REFERRING PROVIDER: Dr. Gurney Maxin   PT End of Session - 12/21/21 1152     Visit Number 31    Number of Visits 55    Date for PT Re-Evaluation 02/15/22    Authorization Time Period Through 02/15/22    Progress Note Due on Visit 36    PT Start Time 1146    PT Stop Time 1228    PT Time Calculation (min) 42 min    Equipment Utilized During Treatment Gait belt    Activity Tolerance Patient tolerated treatment well    Behavior During Therapy WFL for tasks assessed/performed                      Past Medical History:  Diagnosis Date   Cancer ([place])    SKIN   Complication of anesthesia    Depression    Dysrhythmia    GERD (gastroesophageal reflux disease)    Hiatal hernia    HOH (hard of hearing)    Hypertension    Hypothyroidism    PONV (postoperative nausea and vomiting)    Thyroid disease    Past Surgical History:  Procedure Laterality Date   APPENDECTOMY     BACK SURGERY     MULTIPLE/ROD IN BACK   BREAST CYST ASPIRATION Left "years ago"   CATARACT EXTRACTION W/PHACO Left 03/21/2017   Procedure: CATARACT EXTRACTION PHACO AND INTRAOCULAR LENS PLACEMENT ([place]);  Surgeon: Birder Robson, MD;  Location: [facility];  Service: Ophthalmology;  Laterality: Left;  [place] 00:48 AP% 17.8 CDE 8.57 Fluid pack lot # [id number]   CATARACT EXTRACTION W/PHACO Right 04/11/2017   Procedure: CATARACT EXTRACTION PHACO AND INTRAOCULAR LENS PLACEMENT ([facility]);  Surgeon: Birder Robson, MD;  Location: [facility];  Service: Ophthalmology;  Laterality: Right;  [place] 01:09.5 AP% 15.8 CDE 11.04 Fluid Pack lot # [id number]   CHOLECYSTECTOMY     COLON SURGERY     BOWEL OBSTRUCTION   COLONOSCOPY WITH PROPOFOL N/A 04/25/2016   Procedure: COLONOSCOPY WITH PROPOFOL;  Surgeon: Jonathon Bellows, MD;  Location: [facility];  Service:  Endoscopy;  Laterality: N/A;   ESOPHAGOGASTRODUODENOSCOPY (EGD) WITH PROPOFOL N/A 04/25/2016   Procedure: ESOPHAGOGASTRODUODENOSCOPY (EGD) WITH PROPOFOL;  Surgeon: Jonathon Bellows, MD;  Location: [facility];  Service: Endoscopy;  Laterality: N/A;   EXPLORATORY LAPAROTOMY  10/18/2011   blockage small intestines   GIVENS CAPSULE STUDY N/A 05/11/2016   Procedure: GIVENS CAPSULE STUDY;  Surgeon: Jonathon Bellows, MD;  Location: [facility];  Service: Endoscopy;  Laterality: N/A;   Patient Active Problem List   Diagnosis Date Noted   Swelling of limb 03/05/2019   Pain in limb 03/05/2019   History of stroke 03/04/2019   Polyneuropathy 07/31/2018   Bilateral foot pain 03/06/2018   Chronic bilateral low back pain 03/06/2018   Neck pain 03/06/2018   Neuropathy 02/06/2018   History of nonmelanoma skin cancer 09/27/2016   Essential hypertension 09/02/2016   Hiatal hernia    Stricture of esophagus    Absolute anemia    Iron deficiency anemia due to chronic blood loss 03/23/2016   Generalized abdominal pain 03/23/2016   History of tachycardia 07/29/2015   SOB (shortness of breath) on exertion 07/29/2015   BCC (basal cell carcinoma), eyelid 02/11/2013    REFERRING DIAG:  Polyneuropathy, unspecified  R26.81 (ICD-10-CM) - Unsteadiness on feet    THERAPY DIAG:  Muscle weakness (generalized)  Difficulty in walking, not elsewhere classified  Unsteadiness on feet  Abnormality of gait and mobility  Rationale for Evaluation and Treatment Rehabilitation  PERTINENT HISTORY: Patient presents with referral for polyneuropathy/unsteadiness on feet. She endorses past medical history significant for HTN, skin cancer, Neuropathy with bilateral foot pain, Chronic back pain, OA.  PRECAUTIONS: falls  SUBJECTIVE: Patient reports her heel is painful with walking but better compared to yesterday. Patient has appointment with podiatrist on the 16th.     PAIN:  Are you having pain? Yes: NPRS scale:  3-4/10 Pain location: Right  Pain description: ache Aggravating factors: any right hand movement Relieving factors: rest     TODAY'S TREATMENT: 12/21/21    2.5 lb ankle weights:seated on dynadisc ;  -LAQ 15x each LE x2 sets  -seated march 15x each LE; x 2 sets   Removed dyna disc, continued with 2.5# AW -seated heel raise with toes on 1/2 bolster 15x 2 sets  -seated toe raise with heel on 1/2 bolster 15x 2 sets   - lateral 1/2 bolster step overs x 15 ea side  - Seated adduction ball squeeze 20x   Seated on dynadisc: -medial lateral weight shift 10x ea side with PT holding cones for target reaching  -anterior posterior weight shift 10x; PT holding cones for target reaching distance,    PATIENT EDUCATION: Education details: Pt educated throughout session about proper posture and technique with exercises. Improved exercise technique, movement at target joints, use of target muscles after min to mod verbal, visual, tactile cues.  Person educated: Patient Education method: Explanation, Demonstration, Tactile cues, and Verbal cues Education comprehension: verbalized understanding, returned demonstration, verbal cues required, tactile cues required, and needs further education   HOME EXERCISE PROGRAM: No updates this visit, pt to continue HEP as previously given   PT Short Term Goals -      PT SHORT TERM GOAL #1   Title Pt will be independent with initial HEP in order to improve strength and balance in order to decrease fall risk and improve function at home and work.    Baseline 09/02/2021= No formal HEP in place; 10/05/2021- Patient reports walking with walker at home and performing sitting exercises. She reports no questions with today.    Time 6    Period Weeks    Status Achieved    Target Date 10/14/21              PT Long Term Goals -        PT LONG TERM GOAL #1   Title Pt will be independent with final HEP in order to improve strength and balance in order to  decrease fall risk and improve function at home and work.    Baseline 09/02/2021= No formal HEP in place 8/3: compliant   Time 12    Period Weeks    Status On going   Target Date 02/15/22      PT LONG TERM GOAL #2   Title Pt will improve FOTO to target score of 53 to display perceived improvements in ability to complete ADL's    Baseline 09/02/2021= 50; 10/05/2021=62  8/3: 55%   Time 12    Period Weeks    Status On-going    Target Date 02/15/22     PT LONG TERM GOAL #3   Title Pt will improve BERG by at least 5 points in order to demonstrate clinically significant improvement in balance.    Baseline 09/02/2021= 42/56; 10/05/2021=48/56 8/3:  51/56   Time 12    Period Weeks    Status On-going    Target Date 02/15/22     PT LONG TERM GOAL #4   Title Pt will decrease 5TSTS by at least 6 seconds in order to demonstrate clinically significant improvement in LE strength.    Baseline 09/02/2021= 22.63 sec without UE support; 10/05/2021= 17.85 sec without UE Support  8/3: 14.15 seconds no UE support    Time 12    Period Weeks    Status MET   Target Date 02/15/22     PT LONG TERM GOAL #5   Title Pt will decrease TUG to below 14 seconds/decrease in order to demonstrate decreased fall risk.    Baseline 09/02/2021= 17.9 sec without an AD; 10/05/2021= 16.31 sec without an AD  8/3: 9.3 seconds no AD    Time 12    Period Weeks    Status MET   Target Date 02/15/22     PT LONG TERM GOAL #6   Title Pt will increase 10MWT by at least 0.2 m/s in order to demonstrate clinically significant improvement in community ambulation    Baseline 09/02/2021=0.56 m/s without an AD; 10/05/2021= 0.62 m/s 8/3: 0.9 m/s    Time 12    Period Weeks    Status On-going    Target Date 02/15/22   PT LONG TERM GOAL #  Title Patient will increase six minute walk test distance to >1000 for progression to community ambulator and improve gait ability  Baseline 8/3:  3 min 30 seconds with 285 ft no AD   Time 12   Period Weeks    Status NEW  Target Date 02/15/22              Plan    Clinical Impression Statement Continued with current plan of care as laid out in evaluation and recent prior sessions. Pt continued with LE strengthening in seated in order to not exacerbate severely limiting foot pain. Pt tolerated these will with no increase in pain.  Pt continues to demonstrate progress toward goals AEB progression of some interventions this date either in volume or intensity.    Personal Factors and Comorbidities Comorbidity 2    Comorbidities HTN, OA    Examination-Activity Limitations Bend;Caring for Others;Carry;Lift;Squat;Stairs;Transfers;Stand    Examination-Participation Restrictions Community Activity;Shop;Yard Work    [employer] Evolving/Moderate complexity    Rehab Potential Good    PT Frequency 2x / week    PT Duration 12 weeks    PT Treatment/Interventions ADLs/Self Care Home Management;Canalith Repostioning;Cryotherapy;Iontophoresis 4mg /ml Dexamethasone;Moist Heat;Gait training;Stair training;Functional mobility training;Therapeutic activities;Therapeutic exercise;Balance training;Neuromuscular re-education;Patient/family education;Manual techniques;Passive range of motion;Dry needling;Splinting;Vestibular    PT Next Visit Plan Instruct in balance training and LE strengthening    PT Home Exercise Plan 09/14/2021=Access Code: [facility]  URL: [email or web address]; no updates    Consulted and Agree with Plan of Care Patient             Particia Lather PT 12/21/21, 12:58 PM

## 2021-12-20 NOTE — Therapy (Signed)
[place] [facility] [address] [place], [address] Phone: [phone]   Fax:  [phone]  Occupational Therapy Treatment  Patient Details  Name: Tracie Fisher MRN: [id number] Date of Birth: 1935-12-10 Referring Provider (OT): Tracie Fisher   Encounter Date: 12/20/2021   OT End of Session - 12/20/21 1329     Visit Number 2    Number of Visits 4    Date for OT Re-Evaluation 01/17/22    OT Start Time 1216    OT Stop Time 1300    OT Time Calculation (min) 44 min    Activity Tolerance Patient tolerated treatment well    Behavior During Therapy [facility] for tasks assessed/performed             Past Medical History:  Diagnosis Date   Cancer ([place])    SKIN   Complication of anesthesia    Depression    Dysrhythmia    GERD (gastroesophageal reflux disease)    Hiatal hernia    HOH (hard of hearing)    Hypertension    Hypothyroidism    PONV (postoperative nausea and vomiting)    Thyroid disease     Past Surgical History:  Procedure Laterality Date   APPENDECTOMY     BACK SURGERY     MULTIPLE/ROD IN BACK   BREAST CYST ASPIRATION Left "years ago"   CATARACT EXTRACTION W/PHACO Left 03/21/2017   Procedure: CATARACT EXTRACTION PHACO AND INTRAOCULAR LENS PLACEMENT ([place]);  Surgeon: Birder Robson, MD;  Location: [facility];  Service: Ophthalmology;  Laterality: Left;  [place] 00:48 AP% 17.8 CDE 8.57 Fluid pack lot # [id number]   CATARACT EXTRACTION W/PHACO Right 04/11/2017   Procedure: CATARACT EXTRACTION PHACO AND INTRAOCULAR LENS PLACEMENT ([facility]);  Surgeon: Birder Robson, MD;  Location: [facility];  Service: Ophthalmology;  Laterality: Right;  [place] 01:09.5 AP% 15.8 CDE 11.04 Fluid Pack lot # [id number]   CHOLECYSTECTOMY     COLON SURGERY     BOWEL OBSTRUCTION   COLONOSCOPY WITH PROPOFOL N/A 04/25/2016   Procedure: COLONOSCOPY WITH PROPOFOL;  Surgeon: Jonathon Bellows, MD;  Location: [facility];  Service: Endoscopy;   Laterality: N/A;   ESOPHAGOGASTRODUODENOSCOPY (EGD) WITH PROPOFOL N/A 04/25/2016   Procedure: ESOPHAGOGASTRODUODENOSCOPY (EGD) WITH PROPOFOL;  Surgeon: Jonathon Bellows, MD;  Location: [facility];  Service: Endoscopy;  Laterality: N/A;   EXPLORATORY LAPAROTOMY  10/18/2011   blockage small intestines   GIVENS CAPSULE STUDY N/A 05/11/2016   Procedure: GIVENS CAPSULE STUDY;  Surgeon: Jonathon Bellows, MD;  Location: [facility];  Service: Endoscopy;  Laterality: N/A;    There were no vitals filed for this visit.   Subjective Assessment - 12/20/21 1327     Subjective  I tried the hard splint but do not like it - it hurts my thumb more-  but the soft one I wear most all the time - it he;ps some - but thumb and the side of my hand next to surgery hurts    Pertinent History DR Tracie Fisher not 10/28/21 -86 year old, female, S/P re-do open RCTR in the OR on 04/06/21. Complete resolution of the symptoms.     She reports attending OT at [facility]. She reports sharp, intermittent, pain at the base of the R thumb particularly when writing. She notes that a thumb spica splint helps with the pain. The patient reports 3 prior injections at the R 1st CMCJ (2 at [place], 1 with our clinic on 07/19/21). She denies numbness, tingling, nocturnal symptoms  Patient Stated Goals Can you help me getting the right splint so that I can use my hand with less pain with my thumb.  I do not want surgery.    Currently in Pain? Yes    Pain Score 6     Pain Location --   thumb   Pain Orientation Right    Pain Descriptors / Indicators Aching;Tender;Sore    Pain Type Chronic pain                [facility] OT Assessment - 12/20/21 0001       Strength   Right Hand Grip (lbs) 10   15 with CMC splint   Right Hand Lateral Pinch 2 lbs   4 with CMC splint   Right Hand 3 Point Pinch 4 lbs   5 with CMCsplint     Right Hand AROM   R Thumb Radial ABduction/ADduction 0-55 50    R Thumb Palmar ABduction/ADduction 0-45 50    R Thumb Opposition to  Index --   ADD at [facility] - because of inability to flex IP             Patient arrived with continuous pain at right thumb CMC.  She also reports some tenderness and pain continuing on ulnar side of carpal tunnel incision.  Reports that its been going on since November. Patient's grip and prehension strength continues to be limited by pain.  Patient continues to have increased grip and prehension with CMC neoprene splint on. Patient was fitted with a thumb spica a month ago to use at nighttime and heavy activities.  Patient report increased pain with thumb spica and wearing soft neoprene mostly.           OT Treatments/Exercises (OP) - 12/20/21 0001       RUE Paraffin   Number Minutes Paraffin 8 Minutes    RUE Paraffin Location Hand   wrist   Comments prior to soft tissue and ROM - decrease pain              Soft tissue mobilization done to webspace as well as metacarpal spreads.  Soft tissue massage around carpal tunnel incision.  Patient reported pain not much better after the moist heat of paraffin.  Feels like CMC neoprene helped her mostly with her pain. Pain with endrange thumb palmar radial abduction. Patient continues to show decreased IP flexion with increased abduction of CMC into the palm with opposition. With [facility] neoprene splint patient able to activate IP flexion decreasing collapse of thumb CMC and palm. Recommend for patient to follow-up with surgeon to discuss possibility of [facility] arthroplasty. She states she already talked with her son about making appointment for her. Patient continues to use thumb spica as well as CMC neoprene as needed. Use moist heat and continue with maintaining her active range of motion. Did reviewed with her some modifications and joint protection again to decrease pain and thumb.        OT Education - 12/20/21 1328     Education Details progress , splint wearing and changes to HEP    Person(s) Educated Patient    Methods  Explanation;Demonstration;Tactile cues;Verbal cues;Handout    Comprehension Verbal cues required;Returned demonstration;Verbalized understanding                 OT Long Term Goals - 12/20/21 1334       OT LONG TERM GOAL #1   Title Patient to be independent and wearing of thumb CMC  neoprene as well as thumb spica to decrease pain with functional use to less than 5/10 pain    Baseline Pain increases to 6/10 at rest and can increase per pt with writing and pinching to 8-10/10 at thumb [facility] NOW ind in wearing but pain still increase - grip and prehesion increase with CMC neoprene splint on    Time 4    Period Weeks    Status On-going    Target Date 12/20/21      OT LONG TERM GOAL #2   Title Patient show increased grip and prehension strength in right dominant hand with use of splints to be able to write longer as well as do some of her housecleaning, cutting food and pulling up sheets with less pain    Baseline Grip 10 pounds, lateral pinch 2-1/2 pounds and three-point pinch 3 pounds.  At eval with CMC neoprene grip increased to 17 and lateral pinch to 4 pounds, three-point pinch to 5 pounds. 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  Patient had a carpal tunnel release done in November 22.  Patient report continues to have pain 6-10/10 with use of right dominant hand especially with using thumb.  Limiting by pain with writing, making the bed, pulling the sheets up at night, vacuuming, dressing as well as laundry. Pt was fitted 4 wks ago with CMC neoprene splint that provided her some support with ADL's and IADL's - and increase grip and prehension strength. She was fitted with  thumb spica for night time use and heavy  activities- but pt report it increase her pain and not wearing it. Pt cont to show atrophy of thenar eminence of thumb.  Patient is able to do radial and palmar abduction of thumb but unable to do three-point pinch or lateral grip with stability at proximal CMC.  Do have increase pinch to 4-5 lbs with CMC neoprene on.  Patient hyperflexed with MC with extension at IP and collapsing with [facility] into carpal tunnel. Pt cont to be limited by pain - did not decrease with use of moist heat - paraffin - recommend for pt to make appt with surgeon again. Pt in agreement - told her son to make appt. Pt to cont with splint wearing, moist heat, AROM and joint protection. Follow up with me as needed    OT Occupational Profile and History Problem Focused Assessment - Including review of records relating to presenting problem    Occupational performance deficits (Please refer to evaluation for details): ADL's;IADL's;Play;Leisure;Social Participation    Body Structure / Function / Physical Skills ADL;Strength;Decreased knowledge of use of DME;Dexterity;Pain;UE functional use;IADL;Flexibility;FMC    Rehab Potential Fair    Clinical Decision Making Limited treatment options, no task modification necessary    Comorbidities Affecting Occupational Performance: May have comorbidities impacting occupational performance    Modification or Assistance to Complete Evaluation  No modification of tasks or assist necessary to complete eval    OT Duration 8 weeks    OT Treatment/Interventions Self-care/ADL training;Paraffin;Patient/family education;Manual Therapy;Therapeutic exercise;Splinting;DME and/or AE instruction    Consulted and Agree with  Plan of Care Patient             Patient will benefit from skilled therapeutic intervention in order to improve the following deficits and impairments:   Body Structure / Function / Physical Skills: ADL, Strength, Decreased knowledge of use of DME, Dexterity, Pain, UE functional use,  IADL, Flexibility, [facility]       Visit Diagnosis: Muscle weakness (generalized)  Pain in right hand  Other lack of coordination    Problem List Patient Active Problem List   Diagnosis Date Noted   Swelling of limb 03/05/2019   Pain in limb 03/05/2019   History of stroke 03/04/2019   Polyneuropathy 07/31/2018   Bilateral foot pain 03/06/2018   Chronic bilateral low back pain 03/06/2018   Neck pain 03/06/2018   Neuropathy 02/06/2018   History of nonmelanoma skin cancer 09/27/2016   Essential hypertension 09/02/2016   Hiatal hernia    Stricture of esophagus    Absolute anemia    Iron deficiency anemia due to chronic blood loss 03/23/2016   Generalized abdominal pain 03/23/2016   History of tachycardia 07/29/2015   SOB (shortness of breath) on exertion 07/29/2015   BCC (basal cell carcinoma), eyelid 02/11/2013    Rosalyn Gess, OTR/L,CLT 12/20/2021, 1:38 PM  [place] [facility] [address] [place], [address] Phone: [phone]   Fax:  [phone]  Name: MARYANN MCCALL MRN: [id number] Date of Birth: 01-Mar-1936

## 2021-12-21 ENCOUNTER — Ambulatory Visit: Payer: Medicare Other | Admitting: Physical Therapy

## 2021-12-21 ENCOUNTER — Encounter: Payer: Self-pay | Admitting: Physical Therapy

## 2021-12-21 DIAGNOSIS — R262 Difficulty in walking, not elsewhere classified: Secondary | ICD-10-CM

## 2021-12-21 DIAGNOSIS — M6281 Muscle weakness (generalized): Secondary | ICD-10-CM

## 2021-12-21 DIAGNOSIS — R2681 Unsteadiness on feet: Secondary | ICD-10-CM

## 2021-12-21 DIAGNOSIS — R269 Unspecified abnormalities of gait and mobility: Secondary | ICD-10-CM

## 2021-12-23 ENCOUNTER — Ambulatory Visit: Payer: Medicare Other

## 2021-12-23 DIAGNOSIS — R262 Difficulty in walking, not elsewhere classified: Secondary | ICD-10-CM

## 2021-12-23 DIAGNOSIS — M6281 Muscle weakness (generalized): Secondary | ICD-10-CM

## 2021-12-23 DIAGNOSIS — R2681 Unsteadiness on feet: Secondary | ICD-10-CM

## 2021-12-23 NOTE — Therapy (Signed)
OUTPATIENT PHYSICAL THERAPY TREATMENT NOTE   Patient Name: Tracie Fisher MRN: [id number] DOB:July 08, 1935, 86 y.o., female Today's Date: 12/23/2021  PCP: Dr. Benita Stabile REFERRING PROVIDER: Dr. Gurney Maxin   PT End of Session - 12/23/21 1436     Visit Number 32    Number of Visits 52    Date for PT Re-Evaluation 02/15/22    Authorization Time Period Through 02/15/22    Progress Note Due on Visit 41    PT Start Time 1346    PT Stop Time 1427    PT Time Calculation (min) 41 min    Equipment Utilized During Treatment Gait belt    Activity Tolerance Patient tolerated treatment well;No increased pain    Behavior During Therapy WFL for tasks assessed/performed                       Past Medical History:  Diagnosis Date   Cancer ([place])    SKIN   Complication of anesthesia    Depression    Dysrhythmia    GERD (gastroesophageal reflux disease)    Hiatal hernia    HOH (hard of hearing)    Hypertension    Hypothyroidism    PONV (postoperative nausea and vomiting)    Thyroid disease    Past Surgical History:  Procedure Laterality Date   APPENDECTOMY     BACK SURGERY     MULTIPLE/ROD IN BACK   BREAST CYST ASPIRATION Left "years ago"   CATARACT EXTRACTION W/PHACO Left 03/21/2017   Procedure: CATARACT EXTRACTION PHACO AND INTRAOCULAR LENS PLACEMENT ([place]);  Surgeon: Birder Robson, MD;  Location: [facility] ORS;  Service: Ophthalmology;  Laterality: Left;  [place] 00:48 AP% 17.8 CDE 8.57 Fluid pack lot # [id number]   CATARACT EXTRACTION W/PHACO Right 04/11/2017   Procedure: CATARACT EXTRACTION PHACO AND INTRAOCULAR LENS PLACEMENT ([facility]);  Surgeon: Birder Robson, MD;  Location: [facility];  Service: Ophthalmology;  Laterality: Right;  [place] 01:09.5 AP% 15.8 CDE 11.04 Fluid Pack lot # [id number]   CHOLECYSTECTOMY     COLON SURGERY     BOWEL OBSTRUCTION   COLONOSCOPY WITH PROPOFOL N/A 04/25/2016   Procedure: COLONOSCOPY WITH PROPOFOL;  Surgeon: Jonathon Bellows, MD;  Location: [facility]  ENDOSCOPY;  Service: Endoscopy;  Laterality: N/A;   ESOPHAGOGASTRODUODENOSCOPY (EGD) WITH PROPOFOL N/A 04/25/2016   Procedure: ESOPHAGOGASTRODUODENOSCOPY (EGD) WITH PROPOFOL;  Surgeon: Jonathon Bellows, MD;  Location: [facility] ENDOSCOPY;  Service: Endoscopy;  Laterality: N/A;   EXPLORATORY LAPAROTOMY  10/18/2011   blockage small intestines   GIVENS CAPSULE STUDY N/A 05/11/2016   Procedure: GIVENS CAPSULE STUDY;  Surgeon: Jonathon Bellows, MD;  Location: [facility] ENDOSCOPY;  Service: Endoscopy;  Laterality: N/A;   Patient Active Problem List   Diagnosis Date Noted   Swelling of limb 03/05/2019   Pain in limb 03/05/2019   History of stroke 03/04/2019   Polyneuropathy 07/31/2018   Bilateral foot pain 03/06/2018   Chronic bilateral low back pain 03/06/2018   Neck pain 03/06/2018   Neuropathy 02/06/2018   History of nonmelanoma skin cancer 09/27/2016   Essential hypertension 09/02/2016   Hiatal hernia    Stricture of esophagus    Absolute anemia    Iron deficiency anemia due to chronic blood loss 03/23/2016   Generalized abdominal pain 03/23/2016   History of tachycardia 07/29/2015   SOB (shortness of breath) on exertion 07/29/2015   BCC (basal cell carcinoma), eyelid 02/11/2013    REFERRING DIAG:  Polyneuropathy, unspecified  R26.81 (ICD-10-CM) - Unsteadiness on feet  THERAPY DIAG:  Muscle weakness (generalized)  Difficulty in walking, not elsewhere classified  Unsteadiness on feet  Rationale for Evaluation and Treatment Rehabilitation  PERTINENT HISTORY: Patient presents with referral for polyneuropathy/unsteadiness on feet. She endorses past medical history significant for HTN, skin cancer, Neuropathy with bilateral foot pain, Chronic back pain, OA.  PRECAUTIONS: falls  SUBJECTIVE: Pt reports continued heel pain. Waiting for her dr appointment next week. Rates it as 8/10. Pt has been elevating her foot most of the day. She reports she has no other current concerns.     PAIN:  Are you  having pain? Yes: NPRS scale: 8/10 Pain location: Right  Pain description: ache Aggravating factors: any right hand movement Relieving factors: rest     TODAY'S TREATMENT: 12/23/21   Therex 2.5 lb ankle weights:seated on dynadisc: -LAQ 1x20 each LE x2 sets. Rates medium. -seated march 16x each LE; x 2 sets   Removed dyna disc, continued with AW -seated heel raise with toes on 1/2 bolster 30x @ 2.5# rates easy, progress to 3# for 15x B With 3# weights: -seated toe raise with heel on 1/2 bolster 16x 2 sets. Rates easy.   - lateral 1/2 bolster step overs 1x 15, 1x10 ea side  - Seated adduction ball squeeze 20x  NMR Seated on dynadisc: -medial lateral weight shift 10x ea side with PT holding cones for target reaching  -anterior posterior weight shift 10x; PT holding cones for target reaching distance,    PATIENT EDUCATION: Education details: Pt educated throughout session about proper posture and technique with exercises. Improved exercise technique, movement at target joints, use of target muscles after min to mod verbal, visual, tactile cues.  Person educated: Patient Education method: Explanation, Demonstration, Tactile cues, and Verbal cues Education comprehension: verbalized understanding, returned demonstration, verbal cues required, tactile cues required, and needs further education   HOME EXERCISE PROGRAM: No updates this visit, pt to continue HEP as previously given   PT Short Term Goals -      PT SHORT TERM GOAL #1   Title Pt will be independent with initial HEP in order to improve strength and balance in order to decrease fall risk and improve function at home and work.    Baseline 09/02/2021= No formal HEP in place; 10/05/2021- Patient reports walking with walker at home and performing sitting exercises. She reports no questions with today.    Time 6    Period Weeks    Status Achieved    Target Date 10/14/21              PT Long Term Goals -         PT LONG TERM GOAL #1   Title Pt will be independent with final HEP in order to improve strength and balance in order to decrease fall risk and improve function at home and work.    Baseline 09/02/2021= No formal HEP in place 8/3: compliant   Time 12    Period Weeks    Status On going   Target Date 02/15/22      PT LONG TERM GOAL #2   Title Pt will improve FOTO to target score of 53 to display perceived improvements in ability to complete ADL's    Baseline 09/02/2021= 50; 10/05/2021=62  8/3: 55%   Time 12    Period Weeks    Status On-going    Target Date 02/15/22     PT LONG TERM GOAL #3   Title Pt will improve BERG by  at least 5 points in order to demonstrate clinically significant improvement in balance.    Baseline 09/02/2021= 42/56; 10/05/2021=48/56 8/3: 51/56   Time 12    Period Weeks    Status On-going    Target Date 02/15/22     PT LONG TERM GOAL #4   Title Pt will decrease 5TSTS by at least 6 seconds in order to demonstrate clinically significant improvement in LE strength.    Baseline 09/02/2021= 22.63 sec without UE support; 10/05/2021= 17.85 sec without UE Support  8/3: 14.15 seconds no UE support    Time 12    Period Weeks    Status MET   Target Date 02/15/22     PT LONG TERM GOAL #5   Title Pt will decrease TUG to below 14 seconds/decrease in order to demonstrate decreased fall risk.    Baseline 09/02/2021= 17.9 sec without an AD; 10/05/2021= 16.31 sec without an AD  8/3: 9.3 seconds no AD    Time 12    Period Weeks    Status MET   Target Date 02/15/22     PT LONG TERM GOAL #6   Title Pt will increase 10MWT by at least 0.2 m/s in order to demonstrate clinically significant improvement in community ambulation    Baseline 09/02/2021=0.56 m/s without an AD; 10/05/2021= 0.62 m/s 8/3: 0.9 m/s    Time 12    Period Weeks    Status On-going    Target Date 02/15/22   PT LONG TERM GOAL #  Title Patient will increase six minute walk test distance to >1000 for progression to community  ambulator and improve gait ability  Baseline 8/3:  3 min 30 seconds with 285 ft no AD   Time 12   Period Weeks   Status NEW  Target Date 02/15/22              Plan    Clinical Impression Statement PT continued current plan as laid out in recent sessions. Pt able to progress volume or resistance with therex, indicating improved BLE strength and endurance. Interventions still performed seated as to not increase heel pain. Pt had no pain with interventions today.  Pt will benefit from further skilled PT to improve strength, balance and mobility.    Personal Factors and Comorbidities Comorbidity 2    Comorbidities HTN, OA    Examination-Activity Limitations Bend;Caring for Others;Carry;Lift;Squat;Stairs;Transfers;Stand    Examination-Participation Restrictions Community Activity;Shop;Yard Work    [employer] officer Evolving/Moderate complexity    Rehab Potential Good    PT Frequency 2x / week    PT Duration 12 weeks    PT Treatment/Interventions ADLs/Self Care Home Management;Canalith Repostioning;Cryotherapy;Iontophoresis 30m/ml Dexamethasone;Moist Heat;Gait training;Stair training;Functional mobility training;Therapeutic activities;Therapeutic exercise;Balance training;Neuromuscular re-education;Patient/family education;Manual techniques;Passive range of motion;Dry needling;Splinting;Vestibular    PT Next Visit Plan Instruct in balance training and LE strengthening    PT Home Exercise Plan 09/14/2021=Access [facility]: [email or web address] URL: [email or web address]; no updates    Consulted and Agree with Plan of Care Patient             HZollie PeePT 12/23/21, 2:38 PM

## 2021-12-28 ENCOUNTER — Ambulatory Visit: Payer: Medicare Other

## 2021-12-28 ENCOUNTER — Encounter: Payer: Self-pay | Admitting: Physical Therapy

## 2021-12-28 DIAGNOSIS — M6281 Muscle weakness (generalized): Secondary | ICD-10-CM

## 2021-12-28 DIAGNOSIS — R262 Difficulty in walking, not elsewhere classified: Secondary | ICD-10-CM

## 2021-12-28 DIAGNOSIS — R269 Unspecified abnormalities of gait and mobility: Secondary | ICD-10-CM

## 2021-12-28 DIAGNOSIS — R2681 Unsteadiness on feet: Secondary | ICD-10-CM

## 2021-12-28 DIAGNOSIS — M79641 Pain in right hand: Secondary | ICD-10-CM

## 2021-12-28 DIAGNOSIS — R278 Other lack of coordination: Secondary | ICD-10-CM

## 2021-12-28 NOTE — Therapy (Signed)
OUTPATIENT PHYSICAL THERAPY TREATMENT NOTE   Patient Name: Tracie Fisher MRN: [id number] DOB:Nov 06, 1935, 86 y.o., female Today's Date: 12/28/2021  PCP: Dr. Benita Stabile REFERRING PROVIDER: Dr. Gurney Maxin      Past Medical History:  Diagnosis Date   Cancer [facility])    SKIN   Complication of anesthesia    Depression    Dysrhythmia    GERD (gastroesophageal reflux disease)    Hiatal hernia    HOH (hard of hearing)    Hypertension    Hypothyroidism    PONV (postoperative nausea and vomiting)    Thyroid disease    Past Surgical History:  Procedure Laterality Date   APPENDECTOMY     BACK SURGERY     MULTIPLE/ROD IN BACK   BREAST CYST ASPIRATION Left "years ago"   CATARACT EXTRACTION W/PHACO Left 03/21/2017   Procedure: CATARACT EXTRACTION PHACO AND INTRAOCULAR LENS PLACEMENT ([place]);  Surgeon: Birder Robson, MD;  Location: [facility];  Service: Ophthalmology;  Laterality: Left;  [place] 00:48 AP% 17.8 CDE 8.57 Fluid pack lot # [id number]   CATARACT EXTRACTION W/PHACO Right 04/11/2017   Procedure: CATARACT EXTRACTION PHACO AND INTRAOCULAR LENS PLACEMENT ([facility]);  Surgeon: Birder Robson, MD;  Location: [facility];  Service: Ophthalmology;  Laterality: Right;  [place] 01:09.5 AP% 15.8 CDE 11.04 Fluid Pack lot # [id number]   CHOLECYSTECTOMY     COLON SURGERY     BOWEL OBSTRUCTION   COLONOSCOPY WITH PROPOFOL N/A 04/25/2016   Procedure: COLONOSCOPY WITH PROPOFOL;  Surgeon: Jonathon Bellows, MD;  Location: [facility] END[facility];  Service: Endoscopy;  Laterality: N/A;   ESOPHAGOGASTRODUODENOSCOPY (EGD) WITH PROPOFOL N/A 04/25/2016   Procedure: ESOPHAGOGASTRODUODENOSCOPY (EGD) WITH PROPOFOL;  Surgeon: Jonathon Bellows, MD;  Location: [facility] END[facility];  Service: Endoscopy;  Laterality: N/A;   EXPLORATORY LAPAROTOMY  10/18/2011   blockage small intestines   GIVENS CAPSULE STUDY N/A 05/11/2016   Procedure: GIVENS CAPSULE STUDY;  Surgeon: Jonathon Bellows, MD;  Location: [facility] ENDOS[facility];  Service: Endoscopy;  Laterality:  N/A;   Patient Active Problem List   Diagnosis Date Noted   Swelling of limb 03/05/2019   Pain in limb 03/05/2019   History of stroke 03/04/2019   Polyneuropathy 07/31/2018   Bilateral foot pain 03/06/2018   Chronic bilateral low back pain 03/06/2018   Neck pain 03/06/2018   Neuropathy 02/06/2018   History of nonmelanoma skin cancer 09/27/2016   Essential hypertension 09/02/2016   Hiatal hernia    Stricture of esophagus    Absolute anemia    Iron deficiency anemia due to chronic blood loss 03/23/2016   Generalized abdominal pain 03/23/2016   History of tachycardia 07/29/2015   SOB (shortness of breath) on exertion 07/29/2015   BCC (basal cell carcinoma), eyelid 02/11/2013    REFERRING DIAG:  Polyneuropathy, unspecified  R26.81 (ICD-10-CM) - Unsteadiness on feet    THERAPY DIAG:  No diagnosis found.  Rationale for Evaluation and Treatment Rehabilitation  PERTINENT HISTORY: Patient presents with referral for polyneuropathy/unsteadiness on feet. She endorses past medical history significant for HTN, skin cancer, Neuropathy with bilateral foot pain, Chronic back pain, OA.  PRECAUTIONS: falls  SUBJECTIVE: Pt reports that R foot is really bothering her today with pain of 9/10. She states she follows up with the podiatrist on Thursday (8/17). Follow up appointment with Dr. Melrose Nakayama next month. Pt had chiropractice appointment yesterday, where he performed multiple cervical, thoracic, and lumbar manipulations per pt report.    PAIN:  Are you having pain? Yes: NPRS scale: 8/10 Pain location: Right  Pain description:  ache Aggravating factors: any right hand movement Relieving factors: rest     TODAY'S TREATMENT: 12/28/21 Therex: Seated on dynadisk: - seated heel raise with toes on 2" step 2x15, 3# AW placed on thigh for resistance - seated toe raise with heel on 2" step, 2# AW over forefoot for resistance, 2x16 bil - lateral 1/2 bolster step overs x10ea side - seated hip  IR with green TB, 2x12 bil - seated hip ER with green TB, x18; emphasis on eccentric control - LAQ x15, bil hands on thighs, emphasis on eccentric lowering - seated march x15ea, bil hands on thighs  Neuromuscular Re-Ed: - seated on dynadisk: multidirectional reaching for cone outside BOS, emphasis on cross body movement and weight shift to achieve needed distance, 2x1' - STS from chair with bil hands on thighs, x8 reps; emphasis on eccentric lowering  Palpation: - increased erythema noted to R foot with skin discoloration. Capillary refill <2sec, but forefoot and toes notably redish/blueish.     PATIENT EDUCATION: Education details: Pt educated throughout session about proper posture and technique with exercises. Improved exercise technique, movement at target joints, use of target muscles after min to mod verbal, visual, tactile cues. DOMS after session and cryotherapy.  Person educated: Patient Education method: Explanation, Demonstration, Tactile cues, and Verbal cues Education comprehension: verbalized understanding, returned demonstration, verbal cues required, tactile cues required, and needs further education   HOME EXERCISE PROGRAM: No updates this visit, pt to continue HEP as previously given   PT Short Term Goals -      PT SHORT TERM GOAL #1   Title Pt will be independent with initial HEP in order to improve strength and balance in order to decrease fall risk and improve function at home and work.    Baseline 09/02/2021= No formal HEP in place; 10/05/2021- Patient reports walking with walker at home and performing sitting exercises. She reports no questions with today.    Time 6    Period Weeks    Status Achieved    Target Date 10/14/21              PT Long Term Goals -        PT LONG TERM GOAL #1   Title Pt will be independent with final HEP in order to improve strength and balance in order to decrease fall risk and improve function at home and work.    Baseline  09/02/2021= No formal HEP in place 8/3: compliant   Time 12    Period Weeks    Status On going   Target Date 02/15/22      PT LONG TERM GOAL #2   Title Pt will improve FOTO to target score of 53 to display perceived improvements in ability to complete ADL's    Baseline 09/02/2021= 50; 10/05/2021=62  8/3: 55%   Time 12    Period Weeks    Status On-going    Target Date 02/15/22     PT LONG TERM GOAL #3   Title Pt will improve BERG by at least 5 points in order to demonstrate clinically significant improvement in balance.    Baseline 09/02/2021= 42/56; 10/05/2021=48/56 8/3: 51/56   Time 12    Period Weeks    Status On-going    Target Date 02/15/22     PT LONG TERM GOAL #4   Title Pt will decrease 5TSTS by at least 6 seconds in order to demonstrate clinically significant improvement in LE strength.    Baseline 09/02/2021= 22.63  sec without UE support; 10/05/2021= 17.85 sec without UE Support  8/3: 14.15 seconds no UE support    Time 12    Period Weeks    Status MET   Target Date 02/15/22     PT LONG TERM GOAL #5   Title Pt will decrease TUG to below 14 seconds/decrease in order to demonstrate decreased fall risk.    Baseline 09/02/2021= 17.9 sec without an AD; 10/05/2021= 16.31 sec without an AD  8/3: 9.3 seconds no AD    Time 12    Period Weeks    Status MET   Target Date 02/15/22     PT LONG TERM GOAL #6   Title Pt will increase 10MWT by at least 0.2 m/s in order to demonstrate clinically significant improvement in community ambulation    Baseline 09/02/2021=0.56 m/s without an AD; 10/05/2021= 0.62 m/s 8/3: 0.9 m/s    Time 12    Period Weeks    Status On-going    Target Date 02/15/22   PT LONG TERM GOAL #  Title Patient will increase six minute walk test distance to >1000 for progression to community ambulator and improve gait ability  Baseline 8/3:  3 min 30 seconds with 285 ft no AD   Time 12   Period Weeks   Status NEW  Target Date 02/15/22              Plan    Clinical  Impression Statement Pt tolerated treatment progression of treatment well today. Continues to report high levels of R foot pain, therefore, interventions were performed seated today on dynadisk for focus on proximal stability. Emphasis on eccentric control for strengthening with interventions. Increased c/o soreness in hips (L>R) after therex; encouraged to ice at home. Pt will continue to benefit from skilled OPPT services to address deficits and improve overall quality of life and safety with mobility.    Personal Factors and Comorbidities Comorbidity 2    Comorbidities HTN, OA    Examination-Activity Limitations Bend;Caring for Others;Carry;Lift;Squat;Stairs;Transfers;Stand    Examination-Participation Restrictions Community Activity;Shop;Yard Work    [employer] officer Evolving/Moderate complexity    Rehab Potential Good    PT Frequency 2x / week    PT Duration 12 weeks    PT Treatment/Interventions ADLs/Self Care Home Management;Canalith Repostioning;Cryotherapy;Iontophoresis 4mg /ml Dexamethasone;Moist Heat;Gait training;Stair training;Functional mobility training;Therapeutic activities;Therapeutic exercise;Balance training;Neuromuscular re-education;Patient/family education;Manual techniques;Passive range of motion;Dry needling;Splinting;Vestibular    PT Next Visit Plan Instruct in balance training and LE strengthening    PT Home Exercise Plan 09/14/2021=Access Code: [facility]  URL: [email or web address]; no updates    Consulted and Agree with Plan of Care Patient             Herminio Commons, PT, DPT 11:45 AM,12/28/21 Physical Therapist - [facility]

## 2021-12-30 ENCOUNTER — Ambulatory Visit: Payer: Medicare Other | Admitting: Physical Therapy

## 2022-01-04 ENCOUNTER — Ambulatory Visit: Payer: Medicare Other

## 2022-01-04 DIAGNOSIS — R278 Other lack of coordination: Secondary | ICD-10-CM

## 2022-01-04 DIAGNOSIS — R262 Difficulty in walking, not elsewhere classified: Secondary | ICD-10-CM

## 2022-01-04 DIAGNOSIS — R2681 Unsteadiness on feet: Secondary | ICD-10-CM

## 2022-01-04 DIAGNOSIS — M6281 Muscle weakness (generalized): Secondary | ICD-10-CM | POA: Diagnosis not present

## 2022-01-04 DIAGNOSIS — R269 Unspecified abnormalities of gait and mobility: Secondary | ICD-10-CM

## 2022-01-04 NOTE — Therapy (Signed)
OUTPATIENT PHYSICAL THERAPY TREATMENT NOTE   Patient Name: Tracie Fisher MRN: [id number] DOB:03-24-1936, 86 y.o., female Today's Date: 01/04/2022  PCP: Dr. Benita Stabile REFERRING PROVIDER: Dr. Gurney Maxin   PT End of Session - 01/04/22 1104     Visit Number 34    Number of Visits 51    Date for PT Re-Evaluation 02/15/22    Authorization Time Period Through 02/15/22    Progress Note Due on Visit 71    PT Start Time 1100    PT Stop Time 1145    PT Time Calculation (min) 45 min    Equipment Utilized During Treatment Gait belt    Activity Tolerance Patient tolerated treatment well;No increased pain    Behavior During Therapy WFL for tasks assessed/performed               Past Medical History:  Diagnosis Date   Cancer ([place])    SKIN   Complication of anesthesia    Depression    Dysrhythmia    GERD (gastroesophageal reflux disease)    Hiatal hernia    HOH (hard of hearing)    Hypertension    Hypothyroidism    PONV (postoperative nausea and vomiting)    Thyroid disease    Past Surgical History:  Procedure Laterality Date   APPENDECTOMY     BACK SURGERY     MULTIPLE/ROD IN BACK   BREAST CYST ASPIRATION Left "years ago"   CATARACT EXTRACTION W/PHACO Left 03/21/2017   Procedure: CATARACT EXTRACTION PHACO AND INTRAOCULAR LENS PLACEMENT ([place]);  Surgeon: Birder Robson, MD;  Location: [facility];  Service: Ophthalmology;  Laterality: Left;  [place] 00:48 AP% 17.8 CDE 8.57 Fluid pack lot # [id number]   CATARACT EXTRACTION W/PHACO Right 04/11/2017   Procedure: CATARACT EXTRACTION PHACO AND INTRAOCULAR LENS PLACEMENT ([facility]);  Surgeon: Birder Robson, MD;  Location: [facility];  Service: Ophthalmology;  Laterality: Right;  [place] 01:09.5 AP% 15.8 CDE 11.04 Fluid Pack lot # [id number]   CHOLECYSTECTOMY     COLON SURGERY     BOWEL OBSTRUCTION   COLONOSCOPY WITH PROPOFOL N/A 04/25/2016   Procedure: COLONOSCOPY WITH PROPOFOL;  Surgeon: Jonathon Bellows, MD;  Location: [facility];   Service: Endoscopy;  Laterality: N/A;   ESOPHAGOGASTRODUODENOSCOPY (EGD) WITH PROPOFOL N/A 04/25/2016   Procedure: ESOPHAGOGASTRODUODENOSCOPY (EGD) WITH PROPOFOL;  Surgeon: Jonathon Bellows, MD;  Location: [facility];  Service: Endoscopy;  Laterality: N/A;   EXPLORATORY LAPAROTOMY  10/18/2011   blockage small intestines   GIVENS CAPSULE STUDY N/A 05/11/2016   Procedure: GIVENS CAPSULE STUDY;  Surgeon: Jonathon Bellows, MD;  Location: [facility];  Service: Endoscopy;  Laterality: N/A;   Patient Active Problem List   Diagnosis Date Noted   Swelling of limb 03/05/2019   Pain in limb 03/05/2019   History of stroke 03/04/2019   Polyneuropathy 07/31/2018   Bilateral foot pain 03/06/2018   Chronic bilateral low back pain 03/06/2018   Neck pain 03/06/2018   Neuropathy 02/06/2018   History of nonmelanoma skin cancer 09/27/2016   Essential hypertension 09/02/2016   Hiatal hernia    Stricture of esophagus    Absolute anemia    Iron deficiency anemia due to chronic blood loss 03/23/2016   Generalized abdominal pain 03/23/2016   History of tachycardia 07/29/2015   SOB (shortness of breath) on exertion 07/29/2015   BCC (basal cell carcinoma), eyelid 02/11/2013    REFERRING DIAG:  Polyneuropathy, unspecified  R26.81 (ICD-10-CM) - Unsteadiness on feet    THERAPY DIAG:  Muscle weakness (generalized)  Difficulty in walking, not elsewhere classified  Unsteadiness on feet  Other lack of coordination  Abnormality of gait and mobility  Rationale for Evaluation and Treatment Rehabilitation  PERTINENT HISTORY: Patient presents with referral for polyneuropathy/unsteadiness on feet. She endorses past medical history significant for HTN, skin cancer, Neuropathy with bilateral foot pain, Chronic back pain, OA.  PRECAUTIONS: falls  SUBJECTIVE: Pt reports getting shot in heel on L foot. Improvement in pain since then < 5/10 NPS in L foot with standing and walking.     PAIN:  Are you having  pain? Yes: NPRS scale: 5< /10 Pain location: Right  Pain description: ache Aggravating factors: any right hand movement Relieving factors: rest     TODAY'S TREATMENT: 01/04/22 Neuro Re-Ed:   Standing on flat ground:    Feet together eyes open: 2x30 sec, maintains balance. CGA   Tandem R/L: 2x30sec/LE. Increased ankle strategy needed for RLE under BOS tolerating only 3-5 sec bouts before needing finger touch support   Feet together eyes closed: 3x30 sec CGA   Standing on airex pad:    Feet together eyes open: 2x1 min. Minuet ankle righting strategy, CGA   Feet together eyes closed: 3x30 sec. Posterior bias on first set and L lateral lean bias on second and third sets. Intermittent light UE support on bars to assist in balance correction. Noted heavy ankle righting strategy.    Feet together with Head turns (horizontal and vertical) x30 sec/direction. Ankle righting strategy noted in frontal plane    Seated B ankle inver and ever on dynadisc: x20/direction  Seated B ankle PF/DF on dynadisc: x20/direction   PATIENT EDUCATION: Education details: Pt educated throughout session about proper posture and technique with exercises. Improved exercise technique, movement at target joints, use of target muscles after min to mod verbal, visual, tactile cues. DOMS after session and cryotherapy.  Person educated: Patient Education method: Explanation, Demonstration, Tactile cues, and Verbal cues Education comprehension: verbalized understanding, returned demonstration, verbal cues required, tactile cues required, and needs further education   HOME EXERCISE PROGRAM: No updates this visit, pt to continue HEP as previously given   PT Short Term Goals -      PT SHORT TERM GOAL #1   Title Pt will be independent with initial HEP in order to improve strength and balance in order to decrease fall risk and improve function at home and work.    Baseline 09/02/2021= No formal HEP in place; 10/05/2021-  Patient reports walking with walker at home and performing sitting exercises. She reports no questions with today.    Time 6    Period Weeks    Status Achieved    Target Date 10/14/21              PT Long Term Goals -        PT LONG TERM GOAL #1   Title Pt will be independent with final HEP in order to improve strength and balance in order to decrease fall risk and improve function at home and work.    Baseline 09/02/2021= No formal HEP in place 8/3: compliant   Time 12    Period Weeks    Status On going   Target Date 02/15/22      PT LONG TERM GOAL #2   Title Pt will improve FOTO to target score of 53 to display perceived improvements in ability to complete ADL's    Baseline 09/02/2021= 50; 10/05/2021=62  8/3: 55%   Time 12  Period Weeks    Status On-going    Target Date 02/15/22     PT LONG TERM GOAL #3   Title Pt will improve BERG by at least 5 points in order to demonstrate clinically significant improvement in balance.    Baseline 09/02/2021= 42/56; 10/05/2021=48/56 8/3: 51/56   Time 12    Period Weeks    Status On-going    Target Date 02/15/22     PT LONG TERM GOAL #4   Title Pt will decrease 5TSTS by at least 6 seconds in order to demonstrate clinically significant improvement in LE strength.    Baseline 09/02/2021= 22.63 sec without UE support; 10/05/2021= 17.85 sec without UE Support  8/3: 14.15 seconds no UE support    Time 12    Period Weeks    Status MET   Target Date 02/15/22     PT LONG TERM GOAL #5   Title Pt will decrease TUG to below 14 seconds/decrease in order to demonstrate decreased fall risk.    Baseline 09/02/2021= 17.9 sec without an AD; 10/05/2021= 16.31 sec without an AD  8/3: 9.3 seconds no AD    Time 12    Period Weeks    Status MET   Target Date 02/15/22     PT LONG TERM GOAL #6   Title Pt will increase 10MWT by at least 0.2 m/s in order to demonstrate clinically significant improvement in community ambulation    Baseline 09/02/2021=0.56 m/s  without an AD; 10/05/2021= 0.62 m/s 8/3: 0.9 m/s    Time 12    Period Weeks    Status On-going    Target Date 02/15/22   PT LONG TERM GOAL #  Title Patient will increase six minute walk test distance to >1000 for progression to community ambulator and improve gait ability  Baseline 8/3:  3 min 30 seconds with 285 ft no AD   Time 12   Period Weeks   Status NEW  Target Date 02/15/22              Plan    Clinical Impression Statement Pt returning to PT after injection in L heel. Pt has improvement in heel pain and able to tolerate re-introduction to static balance in standing today. Pt demonstrating greatest challenge to balance with tandem stance eyes open and vestibular challenge with eyes closed and unstable surfaces needing significant ankle righting strategy to correct. Therapeutic rest breaks given due to increase in heel pain with prolonged standing. Pt will continue to benefit from skilled OPPT services to address deficits and improve overall quality of life and safety with mobility.   Personal Factors and Comorbidities Comorbidity 2    Comorbidities HTN, OA    Examination-Activity Limitations Bend;Caring for Others;Carry;Lift;Squat;Stairs;Transfers;Stand    Examination-Participation Restrictions Community Activity;Shop;Yard Work    [employer]chant [employer] officer Evolving/Moderate complexity    Rehab Potential Good    PT Frequency 2x / week    PT Duration 12 weeks    PT Treatment/Interventions ADLs/Self Care Home Management;Canalith Repostioning;Cryotherapy;Iontophoresis 4mg /ml Dexamethasone;Moist Heat;Gait training;Stair training;Functional mobility training;Therapeutic activities;Therapeutic exercise;Balance training;Neuromuscular re-education;Patient/family education;Manual techniques;Passive range of motion;Dry needling;Splinting;Vestibular    PT Next Visit Plan Instruct in balance training and LE strengthening    PT Home Exercise Plan 09/14/2021=Access Code: [facility]  URL:  [email or web address]; no updates    Consulted and Agree with Plan of Care Patient            Salem Caster. Fairly IV, PT, DPT Physical Therapist- [facility]  

## 2022-01-06 ENCOUNTER — Encounter: Payer: Self-pay | Admitting: Physical Therapy

## 2022-01-06 ENCOUNTER — Ambulatory Visit: Payer: Medicare Other

## 2022-01-06 ENCOUNTER — Other Ambulatory Visit
Admission: RE | Admit: 2022-01-06 | Discharge: 2022-01-06 | Disposition: A | Discharge status: Home / Self Care | Payer: Medicare Other | Attending: Internal Medicine | Admitting: Internal Medicine

## 2022-01-06 DIAGNOSIS — M6281 Muscle weakness (generalized): Secondary | ICD-10-CM | POA: Diagnosis not present

## 2022-01-06 DIAGNOSIS — H47011 Ischemic optic neuropathy, right eye: Secondary | ICD-10-CM | POA: Diagnosis present

## 2022-01-06 DIAGNOSIS — R262 Difficulty in walking, not elsewhere classified: Secondary | ICD-10-CM

## 2022-01-06 DIAGNOSIS — R278 Other lack of coordination: Secondary | ICD-10-CM

## 2022-01-06 DIAGNOSIS — R269 Unspecified abnormalities of gait and mobility: Secondary | ICD-10-CM

## 2022-01-06 DIAGNOSIS — R2681 Unsteadiness on feet: Secondary | ICD-10-CM

## 2022-01-06 LAB — CBC
HCT: 31.2 % — ABNORMAL LOW (ref 36.0–46.0)
Hemoglobin: 9.9 g/dL — ABNORMAL LOW (ref 12.0–15.0)
MCH: 32.5 pg (ref 26.0–34.0)
MCHC: 31.7 g/dL (ref 30.0–36.0)
MCV: 102.3 fL — ABNORMAL HIGH (ref 80.0–100.0)
Platelets: 272 10*3/uL (ref 150–400)
RBC: 3.05 MIL/uL — ABNORMAL LOW (ref 3.87–5.11)
RDW: 12.5 % (ref 11.5–15.5)
WBC: 5.3 10*3/uL (ref 4.0–10.5)
nRBC: 0 % (ref 0.0–0.2)

## 2022-01-06 LAB — C-REACTIVE PROTEIN: CRP: 0.6 mg/dL (ref <1.0)

## 2022-01-06 LAB — SEDIMENTATION RATE: Sed Rate: 30 mm/hr (ref 0–30)

## 2022-01-06 NOTE — Therapy (Signed)
OUTPATIENT PHYSICAL THERAPY TREATMENT NOTE   Patient Name: Tracie Fisher MRN: [id number] DOB:[birth year], 86 y.o., female Today's Date: 01/06/2022  PCP: Dr. Benita Stabile REFERRING PROVIDER: Dr. Gurney Maxin       Past Medical History:  Diagnosis Date   Cancer [facility])    SKIN   Complication of anesthesia    Depression    Dysrhythmia    GERD (gastroesophageal reflux disease)    Hiatal hernia    HOH (hard of hearing)    Hypertension    Hypothyroidism    PONV (postoperative nausea and vomiting)    Thyroid disease    Past Surgical History:  Procedure Laterality Date   APPENDECTOMY     BACK SURGERY     MULTIPLE/ROD IN BACK   BREAST CYST ASPIRATION Left "years ago"   CATARACT EXTRACTION W/PHACO Left 03/21/2017   Procedure: CATARACT EXTRACTION PHACO AND INTRAOCULAR LENS PLACEMENT ([place]);  Surgeon: Birder Robson, MD;  Location: [facility];  Service: Ophthalmology;  Laterality: Left;  [place] 00:48 AP% 17.8 CDE 8.57 Fluid pack lot # [id number]   CATARACT EXTRACTION W/PHACO Right 04/11/2017   Procedure: CATARACT EXTRACTION PHACO AND INTRAOCULAR LENS PLACEMENT ([facility]);  Surgeon: Birder Robson, MD;  Location: [facility];  Service: Ophthalmology;  Laterality: Right;  [place] 01:09.5 AP% 15.8 CDE 11.04 Fluid Pack lot # [id number]   CHOLECYSTECTOMY     COLON SURGERY     BOWEL OBSTRUCTION   COLONOSCOPY WITH PROPOFOL N/A 04/25/2016   Procedure: COLONOSCOPY WITH PROPOFOL;  Surgeon: Jonathon Bellows, MD;  Location: [facility];  Service: Endoscopy;  Laterality: N/A;   ESOPHAGOGASTRODUODENOSCOPY (EGD) WITH PROPOFOL N/A 04/25/2016   Procedure: ESOPHAGOGASTRODUODENOSCOPY (EGD) WITH PROPOFOL;  Surgeon: Jonathon Bellows, MD;  Location: [facility];  Service: Endoscopy;  Laterality: N/A;   EXPLORATORY LAPAROTOMY  10/18/2011   blockage small intestines   GIVENS CAPSULE STUDY N/A 05/11/2016   Procedure: GIVENS CAPSULE STUDY;  Surgeon: Jonathon Bellows, MD;  Location: [facility];  Service: Endoscopy;  Laterality:  N/A;   Patient Active Problem List   Diagnosis Date Noted   Swelling of limb 03/05/2019   Pain in limb 03/05/2019   History of stroke 03/04/2019   Polyneuropathy 07/31/2018   Bilateral foot pain 03/06/2018   Chronic bilateral low back pain 03/06/2018   Neck pain 03/06/2018   Neuropathy 02/06/2018   History of nonmelanoma skin cancer 09/27/2016   Essential hypertension 09/02/2016   Hiatal hernia    Stricture of esophagus    Absolute anemia    Iron deficiency anemia due to chronic blood loss 03/23/2016   Generalized abdominal pain 03/23/2016   History of tachycardia 07/29/2015   SOB (shortness of breath) on exertion 07/29/2015   BCC (basal cell carcinoma), eyelid 02/11/2013    REFERRING DIAG:  Polyneuropathy, unspecified  R26.81 (ICD-10-CM) - Unsteadiness on feet    THERAPY DIAG:  No diagnosis found.  Rationale for Evaluation and Treatment Rehabilitation  PERTINENT HISTORY: Patient presents with referral for polyneuropathy/unsteadiness on feet. She endorses past medical history significant for HTN, skin cancer, Neuropathy with bilateral foot pain, Chronic back pain, OA.  PRECAUTIONS: falls  SUBJECTIVE: Pt reports foot has felt better since injection last week. HEP is going well. Notes that she noticed vision changes in R eye; went to [facility] and they said it's likely HTN that resulted in partial visual field loss. Notes she noticed this and sometimes notices double vision only when she's watching TV. Goes back to Dr. Melrose Nakayama in September.    PAIN:  Are you having  pain? "Not at the moment. If I did, it would be in my hand"    TODAY'S TREATMENT: 01/06/22 Neuro Re-Ed:   Standing on flat ground:    Feet together eyes open: 30 sec, maintains balance. SBA   Tandem R/L: 2x30sec/LE. Increased ankle strategy needed for RLE under BOS tolerating only ~10-15sec bouts before needing finger touch support      Standing on airex pad:    Feet together eyes open: 2x1 min. Minuet ankle  righting strategy, CGA   Feet together eyes closed: 2x1 min; increased lateral sway noted with 1st repetition which improved on 2nd set, CGA for safety   Feet together with Head turns (horizontal and vertical) 2x30s each. Increased LOB and ankle reactive strategies noted with vertical head turns   Feet together eyes open: 30mn with tic-tac-toe game   Feet together eyes open: 267m with macarena    Seated B ankle inver and ever on dynadisc: x20/direction  Seated B ankle PF/DF on dynadisc: x20/direction  Therapeutic Exercise:  Bil UT stretch bil, x30s each side  Seated scapular retraction, x10ea side  Bil LS stretch bil, x30s each side   PATIENT EDUCATION: Education details: Pt educated throughout session about proper posture and technique with exercises. Improved exercise technique, movement at target joints, use of target muscles after min to mod verbal, visual, tactile cues. DOMS after session and cryotherapy.  Person educated: Patient Education method: Explanation, Demonstration, Tactile cues, and Verbal cues Education comprehension: verbalized understanding, returned demonstration, verbal cues required, tactile cues required, and needs further education   HOME EXERCISE PROGRAM: Access Code: [email or web address]: [email or web address] Date: 01/06/2022 Prepared by: SaAmalia HaileyExercises - Seated Upper Trapezius Stretch  - 1 x daily - 7 x weekly - 3 reps - 30 hold - Gentle Levator Scapulae Stretch  - 1 x daily - 7 x weekly - 3 reps - 30 hold - Seated Scapular Retraction  - 1 x daily - 7 x weekly - 2 sets - 10 reps     PT Short Term Goals -      PT SHORT TERM GOAL #1   Title Pt will be independent with initial HEP in order to improve strength and balance in order to decrease fall risk and improve function at home and work.    Baseline 09/02/2021= No formal HEP in place; 10/05/2021- Patient reports walking with walker at home and performing sitting exercises. She reports  no questions with today.    Time 6    Period Weeks    Status Achieved    Target Date 10/14/21              PT Long Term Goals -        PT LONG TERM GOAL #1   Title Pt will be independent with final HEP in order to improve strength and balance in order to decrease fall risk and improve function at home and work.    Baseline 09/02/2021= No formal HEP in place 8/3: compliant   Time 12    Period Weeks    Status On going   Target Date 02/15/22      PT LONG TERM GOAL #2   Title Pt will improve FOTO to target score of 53 to display perceived improvements in ability to complete ADL's    Baseline 09/02/2021= 50; 10/05/2021=62  8/3: 55%   Time 12    Period Weeks    Status On-going    Target Date 02/15/22     PT  LONG TERM GOAL #3   Title Pt will improve BERG by at least 5 points in order to demonstrate clinically significant improvement in balance.    Baseline 09/02/2021= 42/56; 10/05/2021=48/56 8/3: 51/56   Time 12    Period Weeks    Status On-going    Target Date 02/15/22     PT LONG TERM GOAL #4   Title Pt will decrease 5TSTS by at least 6 seconds in order to demonstrate clinically significant improvement in LE strength.    Baseline 09/02/2021= 22.63 sec without UE support; 10/05/2021= 17.85 sec without UE Support  8/3: 14.15 seconds no UE support    Time 12    Period Weeks    Status MET   Target Date 02/15/22     PT LONG TERM GOAL #5   Title Pt will decrease TUG to below 14 seconds/decrease in order to demonstrate decreased fall risk.    Baseline 09/02/2021= 17.9 sec without an AD; 10/05/2021= 16.31 sec without an AD  8/3: 9.3 seconds no AD    Time 12    Period Weeks    Status MET   Target Date 02/15/22     PT LONG TERM GOAL #6   Title Pt will increase 10MWT by at least 0.2 m/s in order to demonstrate clinically significant improvement in community ambulation    Baseline 09/02/2021=0.56 m/s without an AD; 10/05/2021= 0.62 m/s 8/3: 0.9 m/s    Time 12    Period Weeks    Status  On-going    Target Date 02/15/22   PT LONG TERM GOAL #  Title Patient will increase six minute walk test distance to >1000 for progression to community ambulator and improve gait ability  Baseline 8/3:  3 min 30 seconds with 285 ft no AD   Time 12   Period Weeks   Status NEW  Target Date 02/15/22              Plan    Clinical Impression Statement Pt tolerated treatment well today, but required frequent seated rest breaks due to c/o increased L heel pain with prolonged standing. 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 Provided pt with updated HEP including neck stretches and postural exercises. Improved balance reactive strategies since last session, requiring less UE support. Pt will continue to benefit from skilled OPPT services to address deficits and decrease risk of falls.     Personal Factors and Comorbidities Comorbidity 2    Comorbidities HTN, OA    Examination-Activity Limitations Bend;Caring for Others;Carry;Lift;Squat;Stairs;Transfers;Stand    Examination-Participation Restrictions Community Activity;Shop;Yard Work    [employer] officer Evolving/Moderate complexity    Rehab Potential Good    PT Frequency 2x / week    PT Duration 12 weeks    PT Treatment/Interventions ADLs/Self Care Home Management;Canalith Repostioning;Cryotherapy;Iontophoresis 36m/ml Dexamethasone;Moist Heat;Gait training;Stair training;Functional mobility training;Therapeutic activities;Therapeutic exercise;Balance training;Neuromuscular re-education;Patient/family education;Manual techniques;Passive range of motion;Dry needling;Splinting;Vestibular    PT Next Visit Plan Instruct in balance training and LE strengthening    PT Home Exercise Plan Standing balance exercises with peturbations, head turns, cognitive load, dynamic tasks   Consulted and Agree with Plan of Care Patient              SHerminio Commons PT, DPT 12:25 PM,01/06/22 Physical Therapist [facility]

## 2022-01-11 ENCOUNTER — Ambulatory Visit: Payer: Medicare Other

## 2022-01-11 DIAGNOSIS — R2681 Unsteadiness on feet: Secondary | ICD-10-CM

## 2022-01-11 DIAGNOSIS — R278 Other lack of coordination: Secondary | ICD-10-CM

## 2022-01-11 DIAGNOSIS — R269 Unspecified abnormalities of gait and mobility: Secondary | ICD-10-CM

## 2022-01-11 DIAGNOSIS — R262 Difficulty in walking, not elsewhere classified: Secondary | ICD-10-CM

## 2022-01-11 DIAGNOSIS — M6281 Muscle weakness (generalized): Secondary | ICD-10-CM

## 2022-01-11 NOTE — Therapy (Signed)
OUTPATIENT PHYSICAL THERAPY TREATMENT NOTE   Patient Name: Tracie Fisher MRN: [id number] DOB:06-Apr-1936, 86 y.o., female Today's Date: 01/11/2022  PCP: Dr. Benita Stabile REFERRING PROVIDER: Dr. Gurney Maxin   PT End of Session - 01/11/22 1148     Visit Number 36    Number of Visits 33    Date for PT Re-Evaluation 02/15/22    Authorization Type [employer] Medicare    Authorization Time Period 11/23/21-02/15/22    Progress Note Due on Visit 40    PT Start Time 1145    PT Stop Time 1225    PT Time Calculation (min) 40 min    Equipment Utilized During Treatment Gait belt    Activity Tolerance Patient tolerated treatment well;No increased pain    Behavior During Therapy WFL for tasks assessed/performed                Past Medical History:  Diagnosis Date   Cancer ([place])    SKIN   Complication of anesthesia    Depression    Dysrhythmia    GERD (gastroesophageal reflux disease)    Hiatal hernia    HOH (hard of hearing)    Hypertension    Hypothyroidism    PONV (postoperative nausea and vomiting)    Thyroid disease    Past Surgical History:  Procedure Laterality Date   APPENDECTOMY     BACK SURGERY     MULTIPLE/ROD IN BACK   BREAST CYST ASPIRATION Left "years ago"   CATARACT EXTRACTION W/PHACO Left 03/21/2017   Procedure: CATARACT EXTRACTION PHACO AND INTRAOCULAR LENS PLACEMENT ([place]);  Surgeon: Birder Robson, MD;  Location: [facility];  Service: Ophthalmology;  Laterality: Left;  [place] 00:48 AP% 17.8 CDE 8.57 Fluid pack lot # [id number]   CATARACT EXTRACTION W/PHACO Right 04/11/2017   Procedure: CATARACT EXTRACTION PHACO AND INTRAOCULAR LENS PLACEMENT ([facility]);  Surgeon: Birder Robson, MD;  Location: [facility];  Service: Ophthalmology;  Laterality: Right;  [place] 01:09.5 AP% 15.8 CDE 11.04 Fluid Pack lot # [id number]   CHOLECYSTECTOMY     COLON SURGERY     BOWEL OBSTRUCTION   COLONOSCOPY WITH PROPOFOL N/A 04/25/2016   Procedure: COLONOSCOPY WITH PROPOFOL;  Surgeon: Jonathon Bellows, MD;  Location: [facility];  Service: Endoscopy;  Laterality: N/A;   ESOPHAGOGASTRODUODENOSCOPY (EGD) WITH PROPOFOL N/A 04/25/2016   Procedure: ESOPHAGOGASTRODUODENOSCOPY (EGD) WITH PROPOFOL;  Surgeon: Jonathon Bellows, MD;  Location: [facility];  Service: Endoscopy;  Laterality: N/A;   EXPLORATORY LAPAROTOMY  10/18/2011   blockage small intestines   GIVENS CAPSULE STUDY N/A 05/11/2016   Procedure: GIVENS CAPSULE STUDY;  Surgeon: Jonathon Bellows, MD;  Location: [facility];  Service: Endoscopy;  Laterality: N/A;   Patient Active Problem List   Diagnosis Date Noted   Swelling of limb 03/05/2019   Pain in limb 03/05/2019   History of stroke 03/04/2019   Polyneuropathy 07/31/2018   Bilateral foot pain 03/06/2018   Chronic bilateral low back pain 03/06/2018   Neck pain 03/06/2018   Neuropathy 02/06/2018   History of nonmelanoma skin cancer 09/27/2016   Essential hypertension 09/02/2016   Hiatal hernia    Stricture of esophagus    Absolute anemia    Iron deficiency anemia due to chronic blood loss 03/23/2016   Generalized abdominal pain 03/23/2016   History of tachycardia 07/29/2015   SOB (shortness of breath) on exertion 07/29/2015   BCC (basal cell carcinoma), eyelid 02/11/2013    REFERRING DIAG:  Polyneuropathy, unspecified  R26.81 (ICD-10-CM) - Unsteadiness on feet  THERAPY DIAG:  Muscle weakness (generalized)  Difficulty in walking, not elsewhere classified  Unsteadiness on feet  Other lack of coordination  Abnormality of gait and mobility  Rationale for Evaluation and Treatment Rehabilitation  PERTINENT HISTORY: Patient presents with referral for polyneuropathy/unsteadiness on feet. She endorses past medical history significant for HTN, skin cancer, Neuropathy with bilateral foot pain, Chronic back pain, OA.  PRECAUTIONS: falls  SUBJECTIVE: Pt doing well today, no updates since prior PT session.    PAIN:  Are you having pain? "Not at the moment. If I  did, it would be in my hand"    TODAY'S TREATMENT: 01/11/22 Neuro Re-Ed:                 Standing on flat, firm surface:                          Normal stance eyes closed 1x30sec   Narrow stance eyes closed 2x30sec                         Tandem stance on 2x4 R/L: 4x15sec bilat                                        Standing on airex pad:                         Feet together eyes open: 1x30sec   Narrow stance with alternate horiztonal head turns 1x20                         Feet together eyes closed1x30, PVC chest press x20   Lateral side stepping airex balance beam 3//lengths bilat, hands free (self correction on // bar prn)                 Incline static balance normal stance 2x30sec, decline x30sec 12.5 degrees each             FWD AMB obstacle course with 3  roll stepovers, red mat traverse and fwd step up/down grey4 inch step    PATIENT EDUCATION: Education details: Pt educated throughout session about proper posture and technique with exercises. Improved exercise technique, movement at target joints, use of target muscles after min to mod verbal, visual, tactile cues. DOMS after session and cryotherapy.  Person educated: Patient Education method: Explanation, Demonstration, Tactile cues, and Verbal cues Education comprehension: verbalized understanding, returned demonstration, verbal cues required, tactile cues required, and needs further education   HOME EXERCISE PROGRAM: Access Code: [email or web address] URL: [email or web address] Date: 01/06/2022 Prepared by: Amalia Hailey  Exercises - Seated Upper Trapezius Stretch  - 1 x daily - 7 x weekly - 3 reps - 30 hold - Gentle Levator Scapulae Stretch  - 1 x daily - 7 x weekly - 3 reps - 30 hold - Seated Scapular Retraction  - 1 x daily - 7 x weekly - 2 sets - 10 reps     PT Short Term Goals -      PT SHORT TERM GOAL #1   Title Pt will be independent with initial HEP in order to improve strength and balance in  order to decrease fall risk and improve function at home and work.    Baseline 09/02/2021= No formal HEP in place; 10/05/2021- Patient  reports walking with walker at home and performing sitting exercises. She reports no questions with today.    Time 6    Period Weeks    Status Achieved    Target Date 10/14/21              PT Long Term Goals -        PT LONG TERM GOAL #1   Title Pt will be independent with final HEP in order to improve strength and balance in order to decrease fall risk and improve function at home and work.    Baseline 09/02/2021= No formal HEP in place 8/3: compliant   Time 12    Period Weeks    Status On going   Target Date 02/15/22      PT LONG TERM GOAL #2   Title Pt will improve FOTO to target score of 53 to display perceived improvements in ability to complete ADL's    Baseline 09/02/2021= 50; 10/05/2021=62  8/3: 55%   Time 12    Period Weeks    Status On-going    Target Date 02/15/22     PT LONG TERM GOAL #3   Title Pt will improve BERG by at least 5 points in order to demonstrate clinically significant improvement in balance.    Baseline 09/02/2021= 42/56; 10/05/2021=48/56 8/3: 51/56   Time 12    Period Weeks    Status On-going    Target Date 02/15/22     PT LONG TERM GOAL #4   Title Pt will decrease 5TSTS by at least 6 seconds in order to demonstrate clinically significant improvement in LE strength.    Baseline 09/02/2021= 22.63 sec without UE support; 10/05/2021= 17.85 sec without UE Support  8/3: 14.15 seconds no UE support    Time 12    Period Weeks    Status MET   Target Date 02/15/22     PT LONG TERM GOAL #5   Title Pt will decrease TUG to below 14 seconds/decrease in order to demonstrate decreased fall risk.    Baseline 09/02/2021= 17.9 sec without an AD; 10/05/2021= 16.31 sec without an AD  8/3: 9.3 seconds no AD    Time 12    Period Weeks    Status MET   Target Date 02/15/22     PT LONG TERM GOAL #6   Title Pt will increase 10MWT by at least  0.2 m/s in order to demonstrate clinically significant improvement in community ambulation    Baseline 09/02/2021=0.56 m/s without an AD; 10/05/2021= 0.62 m/s 8/3: 0.9 m/s    Time 12    Period Weeks    Status On-going    Target Date 02/15/22   PT LONG TERM GOAL #  Title Patient will increase six minute walk test distance to >1000 for progression to community ambulator and improve gait ability  Baseline 8/3:  3 min 30 seconds with 285 ft no AD   Time 12   Period Weeks   Status NEW  Target Date 02/15/22              Plan    Clinical Impression Statement Still cranking out some great balance activities here. Pt requires breaks periodically due ot fatigue in hips, but does a great job of taking her time to avoid LOB. Most limiting is uphill static stance due to loading of bilat anterior compartment. Pt will continue to benefit from skilled OPPT services to address deficits and decrease risk of falls.    Personal  Factors and Comorbidities Comorbidity 2    Comorbidities HTN, OA    Examination-Activity Limitations Bend;Caring for Others;Carry;Lift;Squat;Stairs;Transfers;Stand    Examination-Participation Restrictions Community Activity;Shop;Yard Work    [employer]chant [employer] officer Evolving/Moderate complexity    Rehab Potential Good    PT Frequency 2x / week    PT Duration 12 weeks    PT Treatment/Interventions ADLs/Self Care Home Management;Canalith Repostioning;Cryotherapy;Iontophoresis 91m/ml Dexamethasone;Moist Heat;Gait training;Stair training;Functional mobility training;Therapeutic activities;Therapeutic exercise;Balance training;Neuromuscular re-education;Patient/family education;Manual techniques;Passive range of motion;Dry needling;Splinting;Vestibular    PT Next Visit Plan Instruct in balance training and LE strengthening    PT Home Exercise Plan Standing balance exercises with peturbations, head turns, cognitive load, dynamic tasks   Consulted and Agree with Plan of Care  Patient            12:25 PM, 01/11/22 AEtta Grandchild PT, DPT Physical Therapist - [id number] [phone]    12:25 PM,01/11/22 [facility]

## 2022-01-13 ENCOUNTER — Ambulatory Visit: Payer: Medicare Other

## 2022-01-13 DIAGNOSIS — M6281 Muscle weakness (generalized): Secondary | ICD-10-CM | POA: Diagnosis not present

## 2022-01-13 DIAGNOSIS — R269 Unspecified abnormalities of gait and mobility: Secondary | ICD-10-CM

## 2022-01-13 DIAGNOSIS — R2681 Unsteadiness on feet: Secondary | ICD-10-CM

## 2022-01-13 DIAGNOSIS — R262 Difficulty in walking, not elsewhere classified: Secondary | ICD-10-CM

## 2022-01-13 DIAGNOSIS — R278 Other lack of coordination: Secondary | ICD-10-CM

## 2022-01-13 NOTE — Therapy (Signed)
OUTPATIENT PHYSICAL THERAPY TREATMENT NOTE   Patient Name: Tracie Fisher MRN: [id number] DOB:[birth year], 86 y.o., female Today's Date: 01/13/2022  PCP: Dr. Benita Stabile REFERRING PROVIDER: Dr. Gurney Maxin   PT End of Session - 01/13/22 1155     Visit Number 37    Number of Visits 33    Date for PT Re-Evaluation 02/15/22    Authorization Type [employer] Medicare    Authorization Time Period 11/23/21-02/15/22    Progress Note Due on Visit 9    PT Start Time 1103    PT Stop Time 1150    PT Time Calculation (min) 47 min    Equipment Utilized During Treatment Gait belt    Activity Tolerance Patient tolerated treatment well;No increased pain    Behavior During Therapy WFL for tasks assessed/performed                 Past Medical History:  Diagnosis Date   Cancer ([place])    SKIN   Complication of anesthesia    Depression    Dysrhythmia    GERD (gastroesophageal reflux disease)    Hiatal hernia    HOH (hard of hearing)    Hypertension    Hypothyroidism    PONV (postoperative nausea and vomiting)    Thyroid disease    Past Surgical History:  Procedure Laterality Date   APPENDECTOMY     BACK SURGERY     MULTIPLE/ROD IN BACK   BREAST CYST ASPIRATION Left "years ago"   CATARACT EXTRACTION W/PHACO Left 03/21/2017   Procedure: CATARACT EXTRACTION PHACO AND INTRAOCULAR LENS PLACEMENT ([place]);  Surgeon: Birder Robson, MD;  Location: [facility];  Service: Ophthalmology;  Laterality: Left;  [place] 00:48 AP% 17.8 CDE 8.57 Fluid pack lot # [id number]   CATARACT EXTRACTION W/PHACO Right 04/11/2017   Procedure: CATARACT EXTRACTION PHACO AND INTRAOCULAR LENS PLACEMENT ([facility]);  Surgeon: Birder Robson, MD;  Location: [facility];  Service: Ophthalmology;  Laterality: Right;  [place] 01:09.5 AP% 15.8 CDE 11.04 Fluid Pack lot # [id number]   CHOLECYSTECTOMY     COLON SURGERY     BOWEL OBSTRUCTION   COLONOSCOPY WITH PROPOFOL N/A 04/25/2016   Procedure: COLONOSCOPY WITH PROPOFOL;  Surgeon: Jonathon Bellows, MD;  Location: [facility];  Service: Endoscopy;  Laterality: N/A;   ESOPHAGOGASTRODUODENOSCOPY (EGD) WITH PROPOFOL N/A 04/25/2016   Procedure: ESOPHAGOGASTRODUODENOSCOPY (EGD) WITH PROPOFOL;  Surgeon: Jonathon Bellows, MD;  Location: [facility];  Service: Endoscopy;  Laterality: N/A;   EXPLORATORY LAPAROTOMY  10/18/2011   blockage small intestines   GIVENS CAPSULE STUDY N/A 05/11/2016   Procedure: GIVENS CAPSULE STUDY;  Surgeon: Jonathon Bellows, MD;  Location: [facility];  Service: Endoscopy;  Laterality: N/A;   Patient Active Problem List   Diagnosis Date Noted   Swelling of limb 03/05/2019   Pain in limb 03/05/2019   History of stroke 03/04/2019   Polyneuropathy 07/31/2018   Bilateral foot pain 03/06/2018   Chronic bilateral low back pain 03/06/2018   Neck pain 03/06/2018   Neuropathy 02/06/2018   History of nonmelanoma skin cancer 09/27/2016   Essential hypertension 09/02/2016   Hiatal hernia    Stricture of esophagus    Absolute anemia    Iron deficiency anemia due to chronic blood loss 03/23/2016   Generalized abdominal pain 03/23/2016   History of tachycardia 07/29/2015   SOB (shortness of breath) on exertion 07/29/2015   BCC (basal cell carcinoma), eyelid 02/11/2013    REFERRING DIAG:  Polyneuropathy, unspecified  R26.81 (ICD-10-CM) - Unsteadiness on feet  THERAPY DIAG:  Muscle weakness (generalized)  Difficulty in walking, not elsewhere classified  Unsteadiness on feet  Abnormality of gait and mobility  Other lack of coordination  Rationale for Evaluation and Treatment Rehabilitation  PERTINENT HISTORY: Patient presents with referral for polyneuropathy/unsteadiness on feet. She endorses past medical history significant for HTN, skin cancer, Neuropathy with bilateral foot pain, Chronic back pain, OA.  PRECAUTIONS: falls  SUBJECTIVE: Pt reports feeling okay today; no falls since last session. No changes to medication. Heel continues to be painful,  intermittently, and she's been elevating and icing. Pt reports feeling more fatigued  lately (since last Tuesday) .    PAIN:  Are you having pain? 0/10 at rest, increases with walking    TODAY'S TREATMENT: 01/13/22 Neuro Re-Ed:  Standing on flat, firm surface:  NBOS EC, 2x30sec  NBOS EC vertical head turns, 2x30sec  NBOS EC horizontal head turns, 2x30s             Tandem stance on R/L: 2x30sec bilat   Bil heel raise with eccentric LLE lowering, x10 with BUE for support Standing on airex pad:  NBOS EC with horizontal head turns, 2x30s NBOS EC with vertical head turns, 2x30s NBOS EC with 1# chest press, 2x30s   Manual Therapy:  STM to L anterior tibialis/EDL/EHL x81min  L ankle TC AP mobilizations G3, x93min  LLE passive gastroc stretch with intermittent neutral/IV/EV bias, 2x30s ea direction (74min total) at end of session  Not performed today:  Lateral side stepping airex balance beam 3//lengths bilat, hands free (self correction on // bar prn) Incline static balance normal stance 2x30sec, decline x30sec 12.5 degrees each             FWD AMB obstacle course with 3  roll stepovers, red mat traverse and fwd step up/down grey4 inch step  PATIENT EDUCATION: Education details: Pt educated throughout session about proper posture and technique with exercises. Improved exercise technique, movement at target joints, use of target muscles after min to mod verbal, visual, tactile cues. DOMS after session and cryotherapy.  Person educated: Patient Education method: Explanation, Demonstration, Tactile cues, and Verbal cues Education comprehension: verbalized understanding, returned demonstration, verbal cues required, tactile cues required, and needs further education   HOME EXERCISE PROGRAM:  8/31: planned to add eccentric heel raises (stated above) but unable to print out HEP due to internet issues  Access Code: [email or web address] URL: [email or web address] Date:  01/06/2022 Prepared by: Amalia Hailey  Exercises - Seated Upper Trapezius Stretch  - 1 x daily - 7 x weekly - 3 reps - 30 hold - Gentle Levator Scapulae Stretch  - 1 x daily - 7 x weekly - 3 reps - 30 hold - Seated Scapular Retraction  - 1 x daily - 7 x weekly - 2 sets - 10 reps     PT Short Term Goals -      PT SHORT TERM GOAL #1   Title Pt will be independent with initial HEP in order to improve strength and balance in order to decrease fall risk and improve function at home and work.    Baseline 09/02/2021= No formal HEP in place; 10/05/2021- Patient reports walking with walker at home and performing sitting exercises. She reports no questions with today.    Time 6    Period Weeks    Status Achieved    Target Date 10/14/21              PT Long Term Goals -  PT LONG TERM GOAL #1   Title Pt will be independent with final HEP in order to improve strength and balance in order to decrease fall risk and improve function at home and work.    Baseline 09/02/2021= No formal HEP in place 8/3: compliant   Time 12    Period Weeks    Status On going   Target Date 02/15/22      PT LONG TERM GOAL #2   Title Pt will improve FOTO to target score of 53 to display perceived improvements in ability to complete ADL's    Baseline 09/02/2021= 50; 10/05/2021=62  8/3: 55%   Time 12    Period Weeks    Status On-going    Target Date 02/15/22     PT LONG TERM GOAL #3   Title Pt will improve BERG by at least 5 points in order to demonstrate clinically significant improvement in balance.    Baseline 09/02/2021= 42/56; 10/05/2021=48/56 8/3: 51/56   Time 12    Period Weeks    Status On-going    Target Date 02/15/22     PT LONG TERM GOAL #4   Title Pt will decrease 5TSTS by at least 6 seconds in order to demonstrate clinically significant improvement in LE strength.    Baseline 09/02/2021= 22.63 sec without UE support; 10/05/2021= 17.85 sec without UE Support  8/3: 14.15 seconds no UE support     Time 12    Period Weeks    Status MET   Target Date 02/15/22     PT LONG TERM GOAL #5   Title Pt will decrease TUG to below 14 seconds/decrease in order to demonstrate decreased fall risk.    Baseline 09/02/2021= 17.9 sec without an AD; 10/05/2021= 16.31 sec without an AD  8/3: 9.3 seconds no AD    Time 12    Period Weeks    Status MET   Target Date 02/15/22     PT LONG TERM GOAL #6   Title Pt will increase 10MWT by at least 0.2 m/s in order to demonstrate clinically significant improvement in community ambulation    Baseline 09/02/2021=0.56 m/s without an AD; 10/05/2021= 0.62 m/s 8/3: 0.9 m/s    Time 12    Period Weeks    Status On-going    Target Date 02/15/22   PT LONG TERM GOAL #  Title Patient will increase six minute walk test distance to >1000 for progression to community ambulator and improve gait ability  Baseline 8/3:  3 min 30 seconds with 285 ft no AD   Time 12   Period Weeks   Status NEW  Target Date 02/15/22              Plan    Clinical Impression Statement Pt tolerated treatment well today. Pt noted to have limited L ankle DF AROM, increased mm tone of L anterior tibialis, EDL, and EHL which could be contributing to c/o L heel pain, specifically at achilles insertion. Incorporated ankle eccentrics, mobilizations, stretching, and STM into POC, with improved ROM, mm tone, and pain after interventions. Pt progressing with balance interventions well, but still demonstrates imbalance with EC and dual tasks. Educated for pt to continue calf stretches at home. Pt will continue to benefit from skilled OPPT services to address deficits and decrease risk of falls.    Personal Factors and Comorbidities Comorbidity 2    Comorbidities HTN, OA    Examination-Activity Limitations Bend;Caring for Others;Carry;Lift;Squat;Stairs;Transfers;Stand    Examination-Participation Restrictions Community  Activity;Shop;Yard Work    [employer] officer Evolving/Moderate  complexity    Rehab Potential Good    PT Frequency 2x / week    PT Duration 12 weeks    PT Treatment/Interventions ADLs/Self Care Home Management;Canalith Repostioning;Cryotherapy;Iontophoresis 4mg /ml Dexamethasone;Moist Heat;Gait training;Stair training;Functional mobility training;Therapeutic activities;Therapeutic exercise;Balance training;Neuromuscular re-education;Patient/family education;Manual techniques;Passive range of motion;Dry needling;Splinting;Vestibular    PT Next Visit Plan Instruct in balance training and LE strengthening    PT Home Exercise Plan  Standing balance exercises with peturbations, head turns, cognitive load, dynamic cross body tasks; address L DF and soft tissue restrictions   Consulted and Agree with Plan of Care Patient            Herminio Commons, PT, DPT 12:07 PM,01/13/22 Physical Therapist - [facility]

## 2022-01-18 ENCOUNTER — Ambulatory Visit: Payer: Medicare Other | Admitting: Physical Therapy

## 2022-01-20 ENCOUNTER — Ambulatory Visit: Payer: Medicare Other | Admitting: Physical Therapy

## 2022-01-25 ENCOUNTER — Ambulatory Visit: Payer: Medicare Other | Attending: Neurology

## 2022-01-25 DIAGNOSIS — R262 Difficulty in walking, not elsewhere classified: Secondary | ICD-10-CM | POA: Insufficient documentation

## 2022-01-25 DIAGNOSIS — R2681 Unsteadiness on feet: Secondary | ICD-10-CM | POA: Diagnosis present

## 2022-01-25 DIAGNOSIS — R269 Unspecified abnormalities of gait and mobility: Secondary | ICD-10-CM | POA: Insufficient documentation

## 2022-01-25 DIAGNOSIS — M6281 Muscle weakness (generalized): Secondary | ICD-10-CM | POA: Insufficient documentation

## 2022-01-25 NOTE — Therapy (Signed)
OUTPATIENT PHYSICAL THERAPY TREATMENT NOTE   Patient Name: Tracie Fisher MRN: [id number] DOB:02-08-1936, 86 y.o., female Today's Date: 01/25/2022  PCP: Dr. Benita Stabile REFERRING PROVIDER: Dr. Gurney Maxin   PT End of Session - 01/25/22 1025     Visit Number 38    Number of Visits 60    Date for PT Re-Evaluation 02/15/22    Authorization Type [employer] Medicare    Authorization Time Period 11/23/21-02/15/22    Progress Note Due on Visit 40    PT Start Time 1016    PT Stop Time 1100    PT Time Calculation (min) 44 min    Equipment Utilized During Treatment Gait belt    Activity Tolerance Patient tolerated treatment well;No increased pain    Behavior During Therapy WFL for tasks assessed/performed                  Past Medical History:  Diagnosis Date   Cancer ([place])    SKIN   Complication of anesthesia    Depression    Dysrhythmia    GERD (gastroesophageal reflux disease)    Hiatal hernia    HOH (hard of hearing)    Hypertension    Hypothyroidism    PONV (postoperative nausea and vomiting)    Thyroid disease    Past Surgical History:  Procedure Laterality Date   APPENDECTOMY     BACK SURGERY     MULTIPLE/ROD IN BACK   BREAST CYST ASPIRATION Left "years ago"   CATARACT EXTRACTION W/PHACO Left 03/21/2017   Procedure: CATARACT EXTRACTION PHACO AND INTRAOCULAR LENS PLACEMENT ([place]);  Surgeon: Birder Robson, MD;  Location: [facility];  Service: Ophthalmology;  Laterality: Left;  [place] 00:48 AP% 17.8 CDE 8.57 Fluid pack lot # [id number]   CATARACT EXTRACTION W/PHACO Right 04/11/2017   Procedure: CATARACT EXTRACTION PHACO AND INTRAOCULAR LENS PLACEMENT ([facility]);  Surgeon: Birder Robson, MD;  Location: [facility];  Service: Ophthalmology;  Laterality: Right;  [place] 01:09.5 AP% 15.8 CDE 11.04 Fluid Pack lot # [id number]   CHOLECYSTECTOMY     COLON SURGERY     BOWEL OBSTRUCTION   COLONOSCOPY WITH PROPOFOL N/A 04/25/2016   Procedure: COLONOSCOPY WITH PROPOFOL;  Surgeon: Jonathon Bellows, MD;  Location: [facility];  Service: Endoscopy;  Laterality: N/A;   ESOPHAGOGASTRODUODENOSCOPY (EGD) WITH PROPOFOL N/A 04/25/2016   Procedure: ESOPHAGOGASTRODUODENOSCOPY (EGD) WITH PROPOFOL;  Surgeon: Jonathon Bellows, MD;  Location: [facility];  Service: Endoscopy;  Laterality: N/A;   EXPLORATORY LAPAROTOMY  10/18/2011   blockage small intestines   GIVENS CAPSULE STUDY N/A 05/11/2016   Procedure: GIVENS CAPSULE STUDY;  Surgeon: Jonathon Bellows, MD;  Location: [facility];  Service: Endoscopy;  Laterality: N/A;   Patient Active Problem List   Diagnosis Date Noted   Swelling of limb 03/05/2019   Pain in limb 03/05/2019   History of stroke 03/04/2019   Polyneuropathy 07/31/2018   Bilateral foot pain 03/06/2018   Chronic bilateral low back pain 03/06/2018   Neck pain 03/06/2018   Neuropathy 02/06/2018   History of nonmelanoma skin cancer 09/27/2016   Essential hypertension 09/02/2016   Hiatal hernia    Stricture of esophagus    Absolute anemia    Iron deficiency anemia due to chronic blood loss 03/23/2016   Generalized abdominal pain 03/23/2016   History of tachycardia 07/29/2015   SOB (shortness of breath) on exertion 07/29/2015   BCC (basal cell carcinoma), eyelid 02/11/2013    REFERRING DIAG:  Polyneuropathy, unspecified  R26.81 (ICD-10-CM) - Unsteadiness on feet  THERAPY DIAG:  Difficulty in walking, not elsewhere classified  Muscle weakness (generalized)  Unsteadiness on feet  Abnormality of gait and mobility  Rationale for Evaluation and Treatment Rehabilitation  PERTINENT HISTORY: Patient presents with referral for polyneuropathy/unsteadiness on feet. She endorses past medical history significant for HTN, skin cancer, Neuropathy with bilateral foot pain, Chronic back pain, OA.  PRECAUTIONS: falls  SUBJECTIVE:   Pt states she is ready to be discharged, as she is about to have surgery on the R wrist and will be in a cast over the next several weeks.  Pt  states she is under the impression she won't be able to get to her appointments due to the surgery and would like to be d/c at this time.     PAIN:  Are you having pain? 0/10 at rest, increases with walking    TODAY'S TREATMENT: 01/25/22 Neuro Re-Ed:   Goal assessment performed as part of discharge and is expressed in more detail below.  PATIENT EDUCATION: Education details: Pt educated throughout session about proper posture and technique with exercises. Improved exercise technique, movement at target joints, use of target muscles after min to mod verbal, visual, tactile cues. DOMS after session and cryotherapy.  Person educated: Patient Education method: Explanation, Demonstration, Tactile cues, and Verbal cues Education comprehension: verbalized understanding, returned demonstration, verbal cues required, tactile cues required, and needs further education   HOME EXERCISE PROGRAM:  8/31: planned to add eccentric heel raises (stated above) but unable to print out HEP due to internet issues  Access Code: [email or web address] URL: [email or web address] Date: 01/06/2022 Prepared by: Amalia Hailey  Exercises - Seated Upper Trapezius Stretch  - 1 x daily - 7 x weekly - 3 reps - 30 hold - Gentle Levator Scapulae Stretch  - 1 x daily - 7 x weekly - 3 reps - 30 hold - Seated Scapular Retraction  - 1 x daily - 7 x weekly - 2 sets - 10 reps     PT Short Term Goals -      PT SHORT TERM GOAL #1   Title Pt will be independent with initial HEP in order to improve strength and balance in order to decrease fall risk and improve function at home and work.    Baseline 09/02/2021= No formal HEP in place; 10/05/2021- Patient reports walking with walker at home and performing sitting exercises. She reports no questions with today.    Time 6    Period Weeks    Status Achieved    Target Date 10/14/21              PT Long Term Goals -        PT LONG TERM GOAL #1   Title Pt will be  independent with final HEP in order to improve strength and balance in order to decrease fall risk and improve function at home and work.    Baseline 09/02/2021= No formal HEP in place 8/3: compliant   Time 12    Period Weeks    Status On going   Target Date 02/15/22      PT LONG TERM GOAL #2   Title Pt will improve FOTO to target score of 53 to display perceived improvements in ability to complete ADL's    Baseline 09/02/2021= 50; 10/05/2021=62  8/3: 55%; 9/12: 53%   Time 12    Period Weeks    Status Achieved   Target Date 02/15/22     PT LONG TERM GOAL #3   Title  Pt will improve BERG by at least 5 points in order to demonstrate clinically significant improvement in balance.    Baseline 09/02/2021= 42/56; 10/05/2021=48/56 8/3: 51/56; 9/11:    Time 12    Period Weeks    Status On-going    Target Date 02/15/22     PT LONG TERM GOAL #4   Title Pt will decrease 5TSTS by at least 6 seconds in order to demonstrate clinically significant improvement in LE strength.    Baseline 09/02/2021= 22.63 sec without UE support; 10/05/2021= 17.85 sec without UE Support  8/3: 14.15 seconds no UE support;    Time 12    Period Weeks    Status MET   Target Date 02/15/22     PT LONG TERM GOAL #5   Title Pt will decrease TUG to below 14 seconds/decrease in order to demonstrate decreased fall risk.    Baseline 09/02/2021= 17.9 sec without an AD; 10/05/2021= 16.31 sec without an AD  8/3: 9.3 seconds no AD; 9/12: 13.66 sec    Time 12    Period Weeks    Status MET   Target Date 02/15/22     PT LONG TERM GOAL #6   Title Pt will increase 10MWT by at least 0.2 m/s in order to demonstrate clinically significant improvement in community ambulation    Baseline 09/02/2021=0.56 m/s without an AD; 10/05/2021= 0.62 m/s 8/3: 0.9 m/s; 9/12: .79 m/s   Time 12    Period Weeks    Status On-going    Target Date 02/15/22   PT LONG TERM GOAL #  Title Patient will increase six minute walk test distance to >1000 for progression to  community ambulator and improve gait ability  Baseline 8/3:  3 min 30 seconds with 285 ft no AD  9/12: 531 ft with no AD  Time 12   Period Weeks   Status On-going  Target Date 02/15/22              Plan    Clinical Impression Statement Pt discharging today due to upcoming surgery on her R hand/wrist in the upcoming weeks.  Pt has demonstrated increased balance overall, still not meeting certain goals, but self-reports she is doing well.  Pt has benefited from therapeutic exercise, manual therapy, neuromuscular re-education in order to improve overall balance and strength in the LE's.  Pt encouraged to reach out to clinic if any concerns arise.       Personal Factors and Comorbidities Comorbidity 2    Comorbidities HTN, OA    Examination-Activity Limitations Bend;Caring for Others;Carry;Lift;Squat;Stairs;Transfers;Stand    Examination-Participation Restrictions Community Activity;Shop;Yard Work    Merchant navy officer Evolving/Moderate complexity    Rehab Potential Good    PT Frequency 2x / week    PT Duration 12 weeks    PT Treatment/Interventions ADLs/Self Care Home Management;Canalith Repostioning;Cryotherapy;Iontophoresis 57m/ml Dexamethasone;Moist Heat;Gait training;Stair training;Functional mobility training;Therapeutic activities;Therapeutic exercise;Balance training;Neuromuscular re-education;Patient/family education;Manual techniques;Passive range of motion;Dry needling;Splinting;Vestibular    PT Next Visit Plan Instruct in balance training and LE strengthening    PT Home Exercise Plan  Standing balance exercises with peturbations, head turns, cognitive load, dynamic cross body tasks; address L DF and soft tissue restrictions   Consulted and Agree with Plan of Care Patient            JGwenlyn Saran PT, DPT 01/25/22, 11:04 AM Physical Therapist - [facility]

## 2022-01-27 ENCOUNTER — Ambulatory Visit: Payer: Medicare Other | Admitting: Physical Therapy

## 2022-02-01 ENCOUNTER — Ambulatory Visit: Payer: Medicare Other | Admitting: Physical Therapy

## 2022-02-03 ENCOUNTER — Ambulatory Visit: Payer: Medicare Other | Admitting: Physical Therapy

## 2022-02-07 ENCOUNTER — Ambulatory Visit (INDEPENDENT_AMBULATORY_CARE_PROVIDER_SITE_OTHER): Payer: Medicare Other | Admitting: Dermatology

## 2022-02-07 ENCOUNTER — Encounter: Payer: Self-pay | Admitting: Dermatology

## 2022-02-07 DIAGNOSIS — L821 Other seborrheic keratosis: Secondary | ICD-10-CM | POA: Diagnosis not present

## 2022-02-07 DIAGNOSIS — L578 Other skin changes due to chronic exposure to nonionizing radiation: Secondary | ICD-10-CM | POA: Diagnosis not present

## 2022-02-07 DIAGNOSIS — D692 Other nonthrombocytopenic purpura: Secondary | ICD-10-CM | POA: Diagnosis not present

## 2022-02-07 DIAGNOSIS — L820 Inflamed seborrheic keratosis: Secondary | ICD-10-CM

## 2022-02-07 NOTE — Patient Instructions (Addendum)
Cryotherapy Aftercare  Wash gently with soap and water everyday.   Apply Vaseline daily until healed.     Seborrheic Keratosis  What causes seborrheic keratoses? Seborrheic keratoses are harmless, common skin growths that first appear during adult life.  As time goes by, more growths appear.  Some people may develop a large number of them.  Seborrheic keratoses appear on both covered and uncovered body parts.  They are not caused by sunlight.  The tendency to develop seborrheic keratoses can be inherited.  They vary in color from skin-colored to gray, brown, or even black.  They can be either smooth or have a rough, warty surface.   Seborrheic keratoses are superficial and look as if they were stuck on the skin.  Under the microscope this type of keratosis looks like layers upon layers of skin.  That is why at times the top layer may seem to fall off, but the rest of the growth remains and re-grows.    Treatment Seborrheic keratoses do not need to be treated, but can easily be removed in the office.  Seborrheic keratoses often cause symptoms when they rub on clothing or jewelry.  Lesions can be in the way of shaving.  If they become inflamed, they can cause itching, soreness, or burning.  Removal of a seborrheic keratosis can be accomplished by freezing, burning, or surgery. If any spot bleeds, scabs, or grows rapidly, please return to have it checked, as these can be an indication of a skin cancer.   Recommend daily broad spectrum sunscreen SPF 30+ to sun-exposed areas, reapply every 2 hours as needed. Call for new or changing lesions.  Staying in the shade or wearing long sleeves, sun glasses (UVA+UVB protection) and wide brim hats (4-inch brim around the entire circumference of the hat) are also recommended for sun protection.    Due to recent changes in healthcare laws, you may see results of your pathology and/or laboratory studies on MyChart before the doctors have had a chance to review  them. We understand that in some cases there may be results that are confusing or concerning to you. Please understand that not all results are received at the same time and often the doctors may need to interpret multiple results in order to provide you with the best plan of care or course of treatment. Therefore, we ask that you please give [place] 2 business days to thoroughly review all your results before contacting the office for clarification. Should we see a critical lab result, you will be contacted sooner.   If You Need Anything After Your Visit  If you have any questions or concerns for your doctor, please call our main line at [phone] and press option 4 to reach your doctor's medical assistant. If no one answers, please leave a voicemail as directed and we will return your call as soon as possible. Messages left after 4 pm will be answered the following business day.   You may also send [place] a message via [place]. We typically respond to MyChart messages within 1-2 business days.  For prescription refills, please ask your pharmacy to contact our office. Our fax number is [phone].  If you have an urgent issue when the clinic is closed that cannot wait until the next business day, you can page your doctor at the number below.    Please note that while we do our best to be available for urgent issues outside of office hours, we are not available 24/7.  If you have an urgent issue and are unable to reach [place], you may choose to seek medical care at your doctor's office, retail clinic, urgent care center, or emergency room.  If you have a medical emergency, please immediately call 911 or go to the emergency department.  Pager Numbers  - Dr. Nehemiah Massed: [phone]  - Dr. Laurence Ferrari: [phone]  - Dr. Nicole Kindred: [phone]  In the event of inclement weather, please call our main line at [phone] for an update on the status of any delays or closures.  Dermatology Medication  Tips: Please keep the boxes that topical medications come in in order to help keep track of the instructions about where and how to use these. Pharmacies typically print the medication instructions only on the boxes and not directly on the medication tubes.   If your medication is too expensive, please contact our office at [phone] option 4 or send [place] a message through [place].   We are unable to tell what your co-pay for medications will be in advance as this is different depending on your insurance coverage. However, we may be able to find a substitute medication at lower cost or fill out paperwork to get insurance to cover a needed medication.   If a prior authorization is required to get your medication covered by your insurance company, please allow [place] 1-2 business days to complete this process.  Drug prices often vary depending on where the prescription is filled and some pharmacies may offer cheaper prices.  The website www.goodrx.com contains coupons for medications through different pharmacies. The prices here do not account for what the cost may be with help from insurance (it may be cheaper with your insurance), but the website can give you the price if you did not use any insurance.  - You can print the associated coupon and take it with your prescription to the pharmacy.  - You may also stop by our office during regular business hours and pick up a GoodRx coupon card.  - If you need your prescription sent electronically to a different pharmacy, notify our office through [facility] or by phone at [phone] option 4.     Si Usted Necesita Algo Despus de Su Visita  Tambin puede enviarnos un mensaje a travs de Pharmacist, community. Por lo general respondemos a los mensajes de MyChart en el transcurso de 1 a 2 das hbiles.  Para renovar recetas, por favor pida a su farmacia que se ponga en contacto con nuestra oficina. Harland Dingwall de fax es [place] [phone].  Si tiene un  asunto urgente cuando la clnica est cerrada y que no puede esperar hasta el siguiente da hbil, puede llamar/localizar a su doctor(a) al nmero que aparece a continuacin.   Por favor, tenga en cuenta que aunque hacemos todo lo posible para estar disponibles para asuntos urgentes fuera del horario de [place], no estamos disponibles las 24 horas del da, los 7 das de la [place].   Si tiene un problema urgente y no puede comunicarse con nosotros, puede optar por buscar atencin mdica  en el consultorio de su doctor(a), en una clnica privada, en un centro de atencin urgente o en una sala de emergencias.  Si tiene Engineering geologist, por favor llame inmediatamente al 911 o vaya a la sala de emergencias.  Nmeros de bper  - Dr. Nehemiah Massed: [phone]  - Dra. Moye: [phone]  - Dra. Nicole [phone]  En caso de inclemencias del tiempo, por favor llame a nuestra lnea principal  al [phone] para una actualizacin sobre el [place] de cualquier retraso o cierre.  Consejos para la medicacin en dermatologa: Por favor, guarde las cajas en las que vienen los medicamentos de uso tpico para ayudarle a seguir las instrucciones sobre dnde y cmo usarlos. Las farmacias generalmente imprimen las instrucciones del medicamento slo en las cajas y no directamente en los tubos del [place].   Si su medicamento es muy caro, por favor, pngase en contacto con Zigmund Daniel llamando al [phone] y presione la opcin 4 o envenos un mensaje a travs de Pharmacist, community.   No podemos decirle cul ser su copago por los medicamentos por adelantado ya que esto es diferente dependiendo de la cobertura de su seguro. Sin embargo, es posible que podamos encontrar un medicamento sustituto a Electrical engineer un formulario para que el seguro cubra el medicamento que se considera necesario.   Si se requiere una autorizacin previa para que su compaa de seguros [place] su medicamento, por favor  permtanos de 1 a 2 das hbiles para completar este proceso.  Los precios de los medicamentos varan con frecuencia dependiendo del Environmental consultant de dnde se surte la receta y alguna farmacias pueden ofrecer precios ms baratos.  El sitio web www.goodrx.com tiene cupones para medicamentos de Airline pilot. Los precios aqu no tienen en cuenta lo que podra costar con la ayuda del seguro (puede ser ms barato con su seguro), pero el sitio web puede darle el precio si no utiliz Research scientist (physical sciences).  - Puede imprimir el cupn correspondiente y llevarlo con su receta a la farmacia.  - Tambin puede pasar por nuestra oficina durante el horario de atencin regular y Charity fundraiser una tarjeta de cupones de GoodRx.  - Si necesita que su receta se enve electrnicamente a una farmacia diferente, informe a nuestra oficina a travs de MyChart de [place] o por telfono llamando al [phone] y presione la opcin 4.

## 2022-02-07 NOTE — Progress Notes (Unsigned)
   New Patient Visit  Subjective  Tracie Fisher is a 86 y.o. female who presents for the following: lesions (Right hand, back, behind left knee. Rough texture. Lesion on back itches). The patient has spots, moles and lesions to be evaluated, some may be new or changing and the patient has concerns that these could be cancer.  Review of Systems: No other skin or systemic complaints except as noted in HPI or Assessment and Plan.  Objective  Well appearing patient in no apparent distress; mood and affect are within normal limits.  A focused examination was performed including face, hands, back, left leg. Relevant physical exam findings are noted in the Assessment and Plan.  Right Hand Dorsum x2, right forearm x1, right popliteal x1, right inferior scapula x3 (7) Erythematous keratotic or waxy stuck-on papule or plaque.   Assessment & Plan  Inflamed seborrheic keratosis (7) Right Hand Dorsum x2, right forearm x1, right popliteal x1, right inferior scapula x3  Symptomatic, irritating, patient would like treated.  If back is not improving in 4 weeks call for Rx for itching. Will send to [facility].   Destruction of lesion - Right Hand Dorsum x2, right forearm x1, right popliteal x1, right inferior scapula x3 Complexity: simple   Destruction method: cryotherapy   Informed consent: discussed and consent obtained   Timeout:  patient name, date of birth, surgical site, and procedure verified Lesion destroyed using liquid nitrogen: Yes   Region frozen until ice ball extended beyond lesion: Yes   Outcome: patient tolerated procedure well with no complications   Post-procedure details: wound care instructions given   Additional details:  Prior to procedure, discussed risks of blister formation, small wound, skin dyspigmentation, or rare scar following cryotherapy. Recommend Vaseline ointment to treated areas while healing.   Actinic Damage - chronic, secondary to cumulative UV  radiation exposure/sun exposure over time - diffuse scaly erythematous macules with underlying dyspigmentation - Recommend daily broad spectrum sunscreen SPF 30+ to sun-exposed areas, reapply every 2 hours as needed.  - Recommend staying in the shade or wearing long sleeves, sun glasses (UVA+UVB protection) and wide brim hats (4-inch brim around the entire circumference of the hat). - Call for new or changing lesions.  Seborrheic Keratoses - Stuck-on, waxy, tan-brown papules and/or plaques  - Benign-appearing - Discussed benign etiology and prognosis. - Observe - Call for any changes  Purpura - Chronic; persistent and recurrent.  Treatable, but not curable. - Violaceous macules and patches - Benign - Related to trauma, age, sun damage and/or use of blood thinners, chronic use of topical and/or oral steroids - Observe - Can use OTC arnica containing moisturizer such as Dermend Bruise Formula if desired - Call for worsening or other concerns  Return if symptoms worsen or fail to improve.  I, Emelia Salisbury, CMA, am acting as scribe for Sarina Ser, MD. Documentation: I have reviewed the above documentation for accuracy and completeness, and I agree with the above.  Sarina Ser, MD

## 2022-02-08 ENCOUNTER — Encounter: Payer: Self-pay | Admitting: Dermatology

## 2022-02-08 ENCOUNTER — Ambulatory Visit: Payer: Medicare Other | Admitting: Physical Therapy

## 2022-02-10 ENCOUNTER — Ambulatory Visit: Payer: Medicare Other | Admitting: Physical Therapy

## 2022-02-15 ENCOUNTER — Ambulatory Visit: Payer: Medicare Other | Admitting: Physical Therapy

## 2022-02-17 ENCOUNTER — Ambulatory Visit: Payer: Medicare Other | Admitting: Physical Therapy

## 2022-02-22 ENCOUNTER — Ambulatory Visit: Payer: Medicare Other | Admitting: Physical Therapy

## 2022-02-24 ENCOUNTER — Ambulatory Visit: Payer: Medicare Other | Admitting: Physical Therapy

## 2022-03-01 ENCOUNTER — Ambulatory Visit: Payer: Medicare Other | Admitting: Physical Therapy

## 2022-03-01 DIAGNOSIS — E039 Hypothyroidism, unspecified: Secondary | ICD-10-CM | POA: Insufficient documentation

## 2022-03-03 ENCOUNTER — Ambulatory Visit: Payer: Medicare Other | Admitting: Physical Therapy

## 2022-03-03 DIAGNOSIS — N1832 Chronic kidney disease, stage 3b: Secondary | ICD-10-CM | POA: Insufficient documentation

## 2022-03-03 DIAGNOSIS — N184 Chronic kidney disease, stage 4 (severe): Secondary | ICD-10-CM | POA: Insufficient documentation

## 2022-03-08 ENCOUNTER — Ambulatory Visit: Payer: Medicare Other | Admitting: Physical Therapy

## 2022-03-10 ENCOUNTER — Ambulatory Visit: Payer: Medicare Other | Admitting: Physical Therapy

## 2022-03-15 ENCOUNTER — Ambulatory Visit: Payer: Medicare Other

## 2022-03-17 ENCOUNTER — Ambulatory Visit: Payer: Medicare Other

## 2022-03-24 ENCOUNTER — Ambulatory Visit: Payer: Medicare Other

## 2022-03-31 ENCOUNTER — Ambulatory Visit: Payer: Medicare Other

## 2022-04-05 ENCOUNTER — Ambulatory Visit: Payer: Medicare Other

## 2022-04-12 ENCOUNTER — Ambulatory Visit: Payer: Medicare Other

## 2022-04-14 ENCOUNTER — Ambulatory Visit: Payer: Medicare Other

## 2022-04-19 ENCOUNTER — Ambulatory Visit: Payer: Medicare Other | Admitting: Physical Therapy

## 2022-04-21 ENCOUNTER — Ambulatory Visit: Payer: Medicare Other | Admitting: Physical Therapy

## 2022-04-25 ENCOUNTER — Other Ambulatory Visit: Payer: Self-pay

## 2022-04-25 ENCOUNTER — Emergency Department: Payer: Medicare Other

## 2022-04-25 ENCOUNTER — Encounter: Payer: Self-pay | Admitting: Oncology

## 2022-04-25 ENCOUNTER — Emergency Department
Admission: EM | Admit: 2022-04-25 | Discharge: 2022-04-25 | Disposition: A | Discharge status: Home / Self Care | Payer: Medicare Other | Attending: Emergency Medicine | Admitting: Emergency Medicine

## 2022-04-25 DIAGNOSIS — J101 Influenza due to other identified influenza virus with other respiratory manifestations: Secondary | ICD-10-CM | POA: Insufficient documentation

## 2022-04-25 DIAGNOSIS — Z1152 Encounter for screening for COVID-19: Secondary | ICD-10-CM | POA: Insufficient documentation

## 2022-04-25 DIAGNOSIS — I10 Essential (primary) hypertension: Secondary | ICD-10-CM | POA: Insufficient documentation

## 2022-04-25 DIAGNOSIS — R059 Cough, unspecified: Secondary | ICD-10-CM | POA: Diagnosis present

## 2022-04-25 LAB — COMPREHENSIVE METABOLIC PANEL
ALT: 14 U/L (ref 0–44)
AST: 29 U/L (ref 15–41)
Albumin: 3.6 g/dL (ref 3.5–5.0)
Alkaline Phosphatase: 62 U/L (ref 38–126)
Anion gap: 11 (ref 5–15)
BUN: 46 mg/dL — ABNORMAL HIGH (ref 8–23)
CO2: 19 mmol/L — ABNORMAL LOW (ref 22–32)
Calcium: 9.1 mg/dL (ref 8.9–10.3)
Chloride: 104 mmol/L (ref 98–111)
Creatinine, Ser: 1.84 mg/dL — ABNORMAL HIGH (ref 0.44–1.00)
GFR, Estimated: 27 mL/min — ABNORMAL LOW (ref >60)
Glucose, Bld: 113 mg/dL — ABNORMAL HIGH (ref 70–99)
Potassium: 4.7 mmol/L (ref 3.5–5.1)
Sodium: 134 mmol/L — ABNORMAL LOW (ref 135–145)
Total Bilirubin: 0.7 mg/dL (ref 0.3–1.2)
Total Protein: 6.4 g/dL — ABNORMAL LOW (ref 6.5–8.1)

## 2022-04-25 LAB — RESP PANEL BY RT-PCR (RSV, FLU A&B, COVID)  RVPGX2
Influenza A by PCR: POSITIVE — AB
Influenza B by PCR: NEGATIVE
Resp Syncytial Virus by PCR: NEGATIVE
SARS Coronavirus 2 by RT PCR: NEGATIVE

## 2022-04-25 LAB — CBC WITH DIFFERENTIAL/PLATELET
Abs Immature Granulocytes: 0.05 10*3/uL (ref 0.00–0.07)
Basophils Absolute: 0 10*3/uL (ref 0.0–0.1)
Basophils Relative: 0 %
Eosinophils Absolute: 0.1 10*3/uL (ref 0.0–0.5)
Eosinophils Relative: 1 %
HCT: 40.4 % (ref 36.0–46.0)
Hemoglobin: 12.9 g/dL (ref 12.0–15.0)
Immature Granulocytes: 1 %
Lymphocytes Relative: 18 %
Lymphs Abs: 1.6 10*3/uL (ref 0.7–4.0)
MCH: 31.4 pg (ref 26.0–34.0)
MCHC: 31.9 g/dL (ref 30.0–36.0)
MCV: 98.3 fL (ref 80.0–100.0)
Monocytes Absolute: 1 10*3/uL (ref 0.1–1.0)
Monocytes Relative: 11 %
Neutro Abs: 6.2 10*3/uL (ref 1.7–7.7)
Neutrophils Relative %: 69 %
Platelets: 252 10*3/uL (ref 150–400)
RBC: 4.11 MIL/uL (ref 3.87–5.11)
RDW: 12.4 % (ref 11.5–15.5)
WBC: 8.9 10*3/uL (ref 4.0–10.5)
nRBC: 0 % (ref 0.0–0.2)

## 2022-04-25 MED ORDER — BENZONATATE 100 MG PO CAPS: 100.0000 mg | ORAL_CAPSULE | Freq: Four times a day (QID) | ORAL | 0 refills | Status: DC | PRN

## 2022-04-25 MED ORDER — GUAIFENESIN ER 600 MG PO TB12
600.0000 mg | ORAL_TABLET | Freq: Two times a day (BID) | ORAL | 0 refills | Status: DC
Start: 2022-04-25 — End: 2022-06-16

## 2022-04-25 MED ORDER — GUAIFENESIN ER 600 MG PO TB12: 600.0000 mg | ORAL_TABLET | Freq: Two times a day (BID) | ORAL | 0 refills | Status: DC

## 2022-04-25 MED ORDER — BENZONATATE 100 MG PO CAPS
100.0000 mg | ORAL_CAPSULE | Freq: Four times a day (QID) | ORAL | 0 refills | Status: DC | PRN
Start: 2022-04-25 — End: 2022-04-25

## 2022-04-25 NOTE — Discharge Instructions (Signed)
Please use your cough medication as needed for cough or at night before sleep.  Please take your guaifenesin/Mucinex during the daytime as prescribed.  You may also use Tylenol or ibuprofen as needed for fever/discomfort as written on the box.  Please call the number provided for GI medicine to arrange a follow-up appointment to discuss possible endoscopy.  Return to the emergency department for any shortness of breath or if you are unable to tolerate food or drink, or any other reason personally concerning to yourself.

## 2022-04-25 NOTE — ED Provider Notes (Signed)
[facility] Provider Note    Event Date/Time   First MD Initiated Contact with Patient 04/25/22 1149     (approximate)  History   Chief Complaint: Cough  HPI  Tracie Fisher is a 86 y.o. female with a past medical history of gastric reflux, hypertension, presents to the emergency department for cough.  According to the patient over the past 2 weeks she has had a cough now with worsening sputum production.  Saw her doctor for the same who put her on Robitussin but states no improvement so she came to the emergency department.  She also states this morning it felt like she could not get her pills down.  Denies choking on the pill but states she could not get it down and had to spit 1 back up.  Denies any chest pain.  Denies any shortness of breath but states continued cough with significant secretion/mucus.  No fever at any point.  Physical Exam   Triage Vital Signs: ED Triage Vitals [04/25/22 1128]  Enc Vitals Group     BP (!) 101/57     Pulse Rate 64     Resp 20     Temp (!) 97.5 F (36.4 C)     Temp Source Oral     SpO2 96 %     Weight 154 lb (69.9 kg)     Height '5\' 1"'$  (1.549 m)     Head Circumference      Peak Flow      Pain Score 0     Pain Loc      Pain Edu?      Excl. in [place]?     Most recent vital signs: Vitals:   04/25/22 1128  BP: (!) 101/57  Pulse: 64  Resp: 20  Temp: (!) 97.5 F (36.4 C)  SpO2: 96%    General: Awake, no distress.  Spitting secretions/mucus into a bag. CV:  Good peripheral perfusion.  Regular rate and rhythm  Resp:  Normal effort.  Equal breath sounds bilaterally.  Abd:  No distention.  Soft, nontender.  No rebound or guarding.  ED Results / Procedures / Treatments   RADIOLOGY  I have reviewed the chest x-ray images.  On my interpretation of the images patient appears to have a small left pleural effusion but no other findings no consolidations.   MEDICATIONS ORDERED IN ED: Medications - No data to  display   IMPRESSION / MDM / Ong / ED COURSE  I reviewed the triage vital signs and the nursing notes.  Patient's presentation is most consistent with acute presentation with potential threat to life or bodily function.  Patient presents emergency department for worsening cough and sputum/mucus secretions.  States she had some trouble swallowing her pills this morning as well.  But denies coughing on the medications. Patient's workup in the emergency department does show renal insufficiency with creatinine of 1.84 however no recent labs for comparison.  CBC overall reassuring.  We will check for COVID/flu/RSV.  Given the patient's continued cough for 2 weeks now with worsening sputum production x-ray appears to show a left pleural effusion we will obtain CT imaging of the chest to rule out occult pneumonia mass or tumor.  Patient agreeable to plan of care.  Patient's workup is positive for influenza A likely explaining the patient's symptoms.  CT scan does not show any consolidation but does show thickened airways suggesting bronchitis or reactive airway disease.  It does also  show fluid within the esophagus.  Patient states she is able to eat and drink today.  In fact she is drinking water during my evaluation with no issue.  I discussed this with the patient she states many years ago she estimates 20+ years ago she had a endoscopy and dilation.  Discussed with the patient she is to follow-up with GI medicine.  Patient agreeable to plan.  FINAL CLINICAL IMPRESSION(S) / ED DIAGNOSES   Cough Influenza A  Rx / DC Orders   Tessalon Guaifenesin  Note:  This document was prepared using Dragon voice recognition software and may include unintentional dictation errors.   Harvest Dark, MD 04/25/22 1359

## 2022-04-25 NOTE — ED Provider Triage Note (Signed)
Emergency Medicine Provider Triage Evaluation Note  Tracie Fisher , a 86 y.o. female  was evaluated in triage.  Pt complains of cough and congestion, no cp/sob, no weakness.  Review of Systems  Positive:  Negative:   Physical Exam  BP (!) 101/57   Pulse 64   Temp (!) 97.5 F (36.4 C) (Oral)   Resp 20   Ht '5\' 1"'$  (1.549 m)   Wt 69.9 kg   SpO2 96%   BMI 29.10 kg/m  Gen:   Awake, no distress  Resp:  Normal effort  MSK:   Moves extremities without difficulty  Other:    Medical Decision Making  Medically screening exam initiated at 11:39 AM.  Appropriate orders placed.  Tracie Fisher was informed that the remainder of the evaluation will be completed by another provider, this initial triage assessment does not replace that evaluation, and the importance of remaining in the ED until their evaluation is complete.     Versie Starks, PA-C 04/25/22 1140

## 2022-04-25 NOTE — ED Triage Notes (Signed)
Pt to ED from home, [facility]. Pt called out because was swallowing normal PO meds and some got "hung in throat". Per EMS, pt was having coughing fits with some yellow sputum noted,and has had cold this week. Pt is 98% on RA. Voice is clear, respirations unlabored. Pt denies SOB, CP. EMS VSS.   PA at bedside in triage.

## 2022-04-26 ENCOUNTER — Ambulatory Visit: Payer: Medicare Other

## 2022-04-28 ENCOUNTER — Ambulatory Visit: Payer: Medicare Other

## 2022-05-03 ENCOUNTER — Ambulatory Visit: Payer: Medicare Other

## 2022-05-05 ENCOUNTER — Ambulatory Visit: Payer: Medicare Other

## 2022-05-23 DIAGNOSIS — M19049 Primary osteoarthritis, unspecified hand: Secondary | ICD-10-CM | POA: Insufficient documentation

## 2022-05-27 ENCOUNTER — Encounter: Payer: Self-pay | Admitting: Internal Medicine

## 2022-05-27 DIAGNOSIS — Z1231 Encounter for screening mammogram for malignant neoplasm of breast: Secondary | ICD-10-CM

## 2022-06-01 ENCOUNTER — Encounter: Payer: Self-pay | Admitting: Oncology

## 2022-06-09 ENCOUNTER — Ambulatory Visit: Payer: Medicare Other | Admitting: Internal Medicine

## 2022-06-12 DIAGNOSIS — M85832 Other specified disorders of bone density and structure, left forearm: Secondary | ICD-10-CM | POA: Insufficient documentation

## 2022-06-12 DIAGNOSIS — M5481 Occipital neuralgia: Secondary | ICD-10-CM | POA: Insufficient documentation

## 2022-06-12 DIAGNOSIS — E782 Mixed hyperlipidemia: Secondary | ICD-10-CM | POA: Insufficient documentation

## 2022-06-12 DIAGNOSIS — R278 Other lack of coordination: Secondary | ICD-10-CM | POA: Insufficient documentation

## 2022-06-12 NOTE — Assessment & Plan Note (Signed)
Chronic, ongoing, last DEXA 02/15/2017.  Will order repeat DEXA today and recommend she attain at same time as her mammogram, she has number to call and schedule for both.  Recommend Vitamin D3 2000 units daily and adequate calcium intake.  Labs today.

## 2022-06-12 NOTE — Patient Instructions (Signed)

## 2022-06-16 ENCOUNTER — Encounter: Payer: Self-pay | Admitting: Oncology

## 2022-06-16 ENCOUNTER — Ambulatory Visit (INDEPENDENT_AMBULATORY_CARE_PROVIDER_SITE_OTHER): Payer: 59 | Admitting: Nurse Practitioner

## 2022-06-16 ENCOUNTER — Encounter: Payer: Self-pay | Admitting: Nurse Practitioner

## 2022-06-16 VITALS — BP 123/77 | HR 60 | Temp 97.6°F | Ht 60.98 in | Wt 151.6 lb

## 2022-06-16 DIAGNOSIS — R2681 Unsteadiness on feet: Secondary | ICD-10-CM

## 2022-06-16 DIAGNOSIS — I8393 Asymptomatic varicose veins of bilateral lower extremities: Secondary | ICD-10-CM

## 2022-06-16 DIAGNOSIS — Z1231 Encounter for screening mammogram for malignant neoplasm of breast: Secondary | ICD-10-CM

## 2022-06-16 DIAGNOSIS — I272 Pulmonary hypertension, unspecified: Secondary | ICD-10-CM

## 2022-06-16 DIAGNOSIS — E782 Mixed hyperlipidemia: Secondary | ICD-10-CM

## 2022-06-16 DIAGNOSIS — D500 Iron deficiency anemia secondary to blood loss (chronic): Secondary | ICD-10-CM

## 2022-06-16 DIAGNOSIS — G629 Polyneuropathy, unspecified: Secondary | ICD-10-CM

## 2022-06-16 DIAGNOSIS — D692 Other nonthrombocytopenic purpura: Secondary | ICD-10-CM

## 2022-06-16 DIAGNOSIS — R278 Other lack of coordination: Secondary | ICD-10-CM

## 2022-06-16 DIAGNOSIS — Z7689 Persons encountering health services in other specified circumstances: Secondary | ICD-10-CM

## 2022-06-16 DIAGNOSIS — I10 Essential (primary) hypertension: Secondary | ICD-10-CM | POA: Diagnosis not present

## 2022-06-16 DIAGNOSIS — R413 Other amnesia: Secondary | ICD-10-CM

## 2022-06-16 DIAGNOSIS — M5481 Occipital neuralgia: Secondary | ICD-10-CM

## 2022-06-16 DIAGNOSIS — F5101 Primary insomnia: Secondary | ICD-10-CM

## 2022-06-16 DIAGNOSIS — E039 Hypothyroidism, unspecified: Secondary | ICD-10-CM

## 2022-06-16 DIAGNOSIS — M85832 Other specified disorders of bone density and structure, left forearm: Secondary | ICD-10-CM

## 2022-06-16 DIAGNOSIS — Z8673 Personal history of transient ischemic attack (TIA), and cerebral infarction without residual deficits: Secondary | ICD-10-CM

## 2022-06-16 DIAGNOSIS — N1832 Chronic kidney disease, stage 3b: Secondary | ICD-10-CM

## 2022-06-16 DIAGNOSIS — G4700 Insomnia, unspecified: Secondary | ICD-10-CM | POA: Insufficient documentation

## 2022-06-16 LAB — MICROALBUMIN, URINE WAIVED
Creatinine, Urine Waived: 10 mg/dL (ref 10–300)
Microalb, Ur Waived: 10 mg/L (ref 0–19)

## 2022-06-16 NOTE — Assessment & Plan Note (Signed)
Chronic, ongoing.  Continues to be followed by neurology.  Will continue this collaboration and medication regimen as ordered by them.  Recent note reviewed.

## 2022-06-16 NOTE — Assessment & Plan Note (Signed)
Chronic, ongoing.  Followed by neurology and PT, continue this collaboration.  Recent notes reviewed.

## 2022-06-16 NOTE — Assessment & Plan Note (Signed)
Chronic, ongoing.  Followed by cardiology, continue current medication regimen as prescribed by them.  Recent notes reviewed.

## 2022-06-16 NOTE — Assessment & Plan Note (Signed)
Noted on past diagnoses.  Overall stable today.  No memory medications.  Continue collaboration with neurology.  Labs today.  MMSE next visit.

## 2022-06-16 NOTE — Assessment & Plan Note (Signed)
Chronic, ongoing.  Continue current medication regimen and adjust as needed.  Labs today.

## 2022-06-16 NOTE — Assessment & Plan Note (Signed)
Chronic, ongoing for years.  Previous PCP, Dr. Hall Busing, placed her on Xanax many years ago and she still takes, reports she can not sleep without it.  At this time will continue current regimen and adjust if possible.  Will discuss refills next visit and need for every 3 month visits + a controlled substance agreement and yearly UDS.

## 2022-06-16 NOTE — Assessment & Plan Note (Signed)
Chronic, ongoing.  Continue supplement at this time and recheck levels, adjust as needed.

## 2022-06-16 NOTE — Assessment & Plan Note (Signed)
Noted on past labs, educated patient on this as she had questions and had not heard about it.  Currently CKD 3b.  Educated her on diet and fluid intake + current medications.  Recheck labs today.  Urin ALB 25 June 2022.

## 2022-06-16 NOTE — Progress Notes (Signed)
New Patient Office Visit  Subjective    Patient ID: [name], female    DOB: 1936-03-13  Age: 87 y.o. MRN: [id number]  CC:  Chief Complaint  Patient presents with   New Patient (Initial Visit)    To establish care    HPI Tracie Fisher presents for new patient visit to establish care.  Introduced to Designer, jewellery role and practice setting.  All questions answered.  Discussed provider/patient relationship and expectations. Was seeing Dr. Hall Fisher, but no longer taking Medicare -- had been with him for years.  HYPERTENSION / HYPERLIPIDEMIA Continues on Metoprolol, Losartan, Lasix, Plavix, Spironolactone, and Pravastatin.  Moderate pulmonary hypertension. Is followed by cardiology, last saw Dr. Ubaldo Fisher 12/13/21, last echo June 2022 EF 55-60%  History of stroke years ago, last MRI on 03/17/20 did note chronic insults to left frontal lobe, right internal capsule, and left thalamus + right frontal vertex micro hemorrhage -- all unchanged from previous. Satisfied with current treatment? yes Duration of hypertension: chronic BP monitoring frequency: rarely BP range:  BP medication side effects: no Duration of hyperlipidemia: chronic Cholesterol medication side effects: no Cholesterol supplements: none Medication compliance: good compliance Aspirin: yes Recent stressors: no Recurrent headaches: no Visual changes: no Palpitations: no Dyspnea: no Chest pain: no Lower extremity edema: no Dizzy/lightheaded: no   CHRONIC KIDNEY DISEASE CKD status: stable Medications renally dose: yes Previous renal evaluation: no Pneumovax:  Up to Date Influenza Vaccine:  Up to Date   HYPOTHYROIDISM Continues on Levothyroxine 50 MCG daily.  Has been on for years. Thyroid control status:stable Satisfied with current treatment? yes Medication side effects: no Medication compliance: good compliance Etiology of hypothyroidism: unknown Recent dose adjustment:no Fatigue: no Cold intolerance:  no Heat intolerance: no Weight gain: no Weight loss: no Constipation: yes - Metamucil Diarrhea/loose stools: no Palpitations: no -- did have this initially when diagnosed Lower extremity edema: no Anxiety/depressed mood: no  OSTEOPENIA Last scan 2018. T-score -1.1. Adequate calcium & vitamin D: yes Weight bearing exercises: yes   INSOMNIA Has been on this for many years, Dr. Hall Fisher her previous PCP started years ago.  She wishes to remain on it.  Pt is aware of risks of psychoactive medication use to include increased sedation, respiratory suppression, falls, extrapyramidal movements,  dependence and cardiovascular events.  Pt would like to continue treatment as benefit determined to outweigh risk.    Duration: chronic Satisfied with sleep quality: yes Difficulty falling asleep: yes Difficulty staying asleep: no Waking a few hours after sleep onset: no Early morning awakenings: no Daytime hypersomnolence: no Wakes feeling refreshed: no Good sleep hygiene: yes Apnea: no Snoring: no Depressed/anxious mood: no Recent stress: no Restless legs/nocturnal leg cramps: no Chronic pain/arthritis: yes History of sleep study: no Treatments attempted:  Xanax and benadryl    NEUROPATHY Taking Pregabalin 75 MG TID + Plavix.  Tracie Fisher is performing bilateral occipital nerve blocks on patient for occipital neuralgia.  Has some sensory ataxia, uses walker for support and is in need of new walker order -- walker with wider seat order needs to be sent to [facility].  Was attending physical therapy recently, last visit on 05/23/22.  Saw vascular last for leg pain on 03/15/2019.  She has varicosities to lower legs and reports these often bleed easily.  Not wearing compression hose.  Gabapentin in past caused weight gain, Cymbalta caused drowsiness, Nortriptyline caused drowsiness. Neuropathy status: stable  Satisfied with current treatment?: yes Medication side effects: no Medication  compliance:  good  compliance Location: feet Frequency: constant Bilateral: yes Symmetric: yes Numbness: no Decreased sensation: yes Weakness: yes Context: stable Alleviating factors: as above Aggravating factors: walking Treatments attempted: as above     06/16/2022   11:27 AM  Depression screen PHQ 2/9  Decreased Interest 0  Down, Depressed, Hopeless 0  PHQ - 2 Score 0  Altered sleeping 0  Tired, decreased energy 0  Change in appetite 0  Feeling bad or failure about yourself  0  Trouble concentrating 0  Moving slowly or fidgety/restless 0  Suicidal thoughts 0  PHQ-9 Score 0  Difficult doing work/chores Not difficult at all       06/16/2022   11:28 AM  GAD 7 : Generalized Anxiety Score  Nervous, Anxious, on Edge 0  Control/stop worrying 0  Worry too much - different things 0  Trouble relaxing 0  Restless 0  Easily annoyed or irritable 0  Afraid - awful might happen 0  Total GAD 7 Score 0  Anxiety Difficulty Not difficult at all        10/13/2019    3:56 PM 04/25/2022   11:31 AM 06/16/2022   11:28 AM  Fall Risk  Falls in the past year?   1  Was there an injury with Fall?   0  Fall Risk Category Calculator   1  (RETIRED) Patient Fall Risk Level Low fall risk Moderate fall risk   Patient at Risk for Falls Due to   History of fall(s)  Fall risk Follow up   Education provided    Functional Status Survey: Is the patient deaf or have difficulty hearing?: No Does the patient have difficulty seeing, even when wearing glasses/contacts?: No Does the patient have difficulty concentrating, remembering, or making decisions?: No Does the patient have difficulty walking or climbing stairs?: No Does the patient have difficulty dressing or bathing?: No Does the patient have difficulty doing errands alone such as visiting a doctor's office or shopping?: No   Outpatient Encounter Medications as of 06/16/2022  Medication Sig   ALPRAZolam (XANAX) 1 MG tablet Take 0.5-1 mg by  mouth 2 (two) times daily. Take 0.5 mg in the morning and 1 mg at bedtime   clopidogrel (PLAVIX) 75 MG tablet Take by mouth.   cyanocobalamin (V-R VITAMIN B-12) 500 MCG tablet Take 500 mcg by mouth daily.    ferrous gluconate (FERGON) 240 (27 FE) MG tablet Take by mouth.   furosemide (LASIX) 20 MG tablet Take 20 mg by mouth 2 (two) times daily.   levothyroxine (SYNTHROID, LEVOTHROID) 50 MCG tablet Take 50 mcg by mouth daily before breakfast.    losartan (COZAAR) 50 MG tablet Take 50 mg daily by mouth.   metoprolol tartrate (LOPRESSOR) 25 MG tablet Take 25 mg by mouth 2 (two) times daily.   Multiple Vitamin (MULTIVITAMIN WITH MINERALS) TABS tablet Take 1 tablet by mouth daily.   omeprazole (PRILOSEC) 40 MG capsule Take 40 mg by mouth.   pravastatin (PRAVACHOL) 20 MG tablet Take by mouth.   pregabalin (LYRICA) 75 MG capsule Take 75 mg by mouth 3 (three) times daily.   Probiotic Product (PROBIOTIC DAILY PO) Take by mouth daily.   psyllium (METAMUCIL) 58.6 % powder Take 1 packet by mouth 3 (three) times daily.    spironolactone (ALDACTONE) 25 MG tablet Take 1 tablet by mouth daily.   [DISCONTINUED] dexlansoprazole (DEXILANT) 60 MG capsule Take 60 mg by mouth.   [DISCONTINUED] Difluprednate (DUREZOL) 0.05 % EMUL Place 1 drop into the left eye  2 (two) times daily. Starting after the procedure   [DISCONTINUED] DULoxetine (CYMBALTA) 30 MG capsule Take 30 mg by mouth.   [DISCONTINUED] gabapentin (NEURONTIN) 100 MG capsule Take by mouth.   [DISCONTINUED] guaiFENesin (MUCINEX) 600 MG 12 hr tablet Take 1 tablet (600 mg total) by mouth 2 (two) times daily.   [DISCONTINUED] benzonatate (TESSALON PERLES) 100 MG capsule Take 1 capsule (100 mg total) by mouth every 6 (six) hours as needed for cough.   [DISCONTINUED] metoprolol tartrate (LOPRESSOR) 25 MG tablet Take 25 mg by mouth 2 (two) times daily.    [DISCONTINUED] pregabalin (LYRICA) 50 MG capsule Take by mouth.   No facility-administered encounter  medications on file as of 06/16/2022.    Past Medical History:  Diagnosis Date   Cancer ([place])    SKIN   Complication of anesthesia    Depression    Dysrhythmia    GERD (gastroesophageal reflux disease)    Hiatal hernia    HOH (hard of hearing)    Hypertension    Hypothyroidism    PONV (postoperative nausea and vomiting)    Thyroid disease     Past Surgical History:  Procedure Laterality Date   APPENDECTOMY     BACK SURGERY     MULTIPLE/ROD IN BACK   BREAST CYST ASPIRATION Left "years ago"   CATARACT EXTRACTION W/PHACO Left 03/21/2017   Procedure: CATARACT EXTRACTION PHACO AND INTRAOCULAR LENS PLACEMENT ([place]);  Surgeon: Birder Robson, MD;  Location: [facility];  Service: Ophthalmology;  Laterality: Left;  [place] 00:48 AP% 17.8 CDE 8.57 Fluid pack lot # [id number]   CATARACT EXTRACTION W/PHACO Right 04/11/2017   Procedure: CATARACT EXTRACTION PHACO AND INTRAOCULAR LENS PLACEMENT ([facility]);  Surgeon: Birder Robson, MD;  Location: [facility];  Service: Ophthalmology;  Laterality: Right;  [place] 01:09.5 AP% 15.8 CDE 11.04 Fluid Pack lot # [id number]   CHOLECYSTECTOMY     COLON SURGERY     BOWEL OBSTRUCTION   COLONOSCOPY WITH PROPOFOL N/A 04/25/2016   Procedure: COLONOSCOPY WITH PROPOFOL;  Surgeon: Jonathon Bellows, MD;  Location: [facility];  Service: Endoscopy;  Laterality: N/A;   ESOPHAGOGASTRODUODENOSCOPY (EGD) WITH PROPOFOL N/A 04/25/2016   Procedure: ESOPHAGOGASTRODUODENOSCOPY (EGD) WITH PROPOFOL;  Surgeon: Jonathon Bellows, MD;  Location: [facility];  Service: Endoscopy;  Laterality: N/A;   EXPLORATORY LAPAROTOMY  10/18/2011   blockage small intestines   GIVENS CAPSULE STUDY N/A 05/11/2016   Procedure: GIVENS CAPSULE STUDY;  Surgeon: Jonathon Bellows, MD;  Location: [facility] END[facility];  Service: Endoscopy;  Laterality: N/A;    Family History  Problem Relation Age of Onset   Breast cancer Sister 43   Heart attack Brother    Heart disease Father     Social History   Socioeconomic History    Marital status: Single    Spouse name: Not on file   Number of children: Not on file   Years of education: Not on file   Highest education level: Not on file  Occupational History   Not on file  Tobacco Use   Smoking status: Former    Years: 20.00    Types: Cigarettes   Smokeless tobacco: Never  Vaping Use   Vaping Use: Never used  Substance and Sexual Activity   Alcohol use: Yes    Comment: 1 beer /day or 2 occ   Drug use: No   Sexual activity: Yes    Birth control/protection: Post-menopausal  Other Topics Concern   Not on file  Social History Narrative   Not on file  Social Determinants of Health   Financial Resource Strain: Low Risk  (06/16/2022)   Overall Financial Resource Strain (CARDIA)    Difficulty of Paying Living Expenses: Not hard at all  Food Insecurity: No Food Insecurity (06/16/2022)   Hunger Vital Sign    Worried About Running Out of Food in the Last Year: Never true    Ran Out of Food in the Last Year: Never true  Transportation Needs: No Transportation Needs (06/16/2022)   PRAPARE - Hydrologist (Medical): No    Lack of Transportation (Non-Medical): No  Physical Activity: Insufficiently Active (06/16/2022)   Exercise Vital Sign    Days of Exercise per Week: 2 days    Minutes of Exercise per Session: 20 min  Stress: No Stress Concern Present (06/16/2022)   [place]    Feeling of Stress : Not at all  Social Connections: Moderately Isolated (06/16/2022)   Social Connection and Isolation Panel [NHANES]    Frequency of Communication with Friends and Family: More than three times a week    Frequency of Social Gatherings with Friends and Family: More than three times a week    Attends Religious Services: More than 4 times per year    Active Member of [employer] or Organizations: No    Attends Archivist Meetings: Never    Marital Status: Widowed  Intimate  Partner Violence: Not At Risk (06/16/2022)   Humiliation, Afraid, Rape, and Kick questionnaire    Fear of Current or Ex-Partner: No    Emotionally Abused: No    Physically Abused: No    Sexually Abused: No   Review of Systems  Constitutional:  Negative for chills, diaphoresis, fever and weight loss.  Respiratory:  Negative for cough, shortness of breath and wheezing.   Cardiovascular:  Negative for chest pain, palpitations, orthopnea and leg swelling.  Neurological: Negative.   Endo/Heme/Allergies:  Bruises/bleeds easily.  Psychiatric/Behavioral:  Negative for depression and memory loss. The patient has insomnia. The patient is not nervous/anxious.       Objective    BP 123/77   Pulse 60   Temp 97.6 F (36.4 C) (Oral)   Ht 5' 0.98" (1.549 m)   Wt 151 lb 9.6 oz (68.8 kg)   SpO2 98%   BMI 28.66 kg/m   Physical Exam Vitals and nursing note reviewed.  Constitutional:      General: She is awake. She is not in acute distress.    Appearance: She is well-developed and well-groomed. She is not ill-appearing or toxic-appearing.  HENT:     Head: Normocephalic.     Right Ear: Hearing and external ear normal.     Left Ear: Hearing and external ear normal.     Nose: Nose normal.     Mouth/Throat:     Mouth: Mucous membranes are moist.  Eyes:     General: Lids are normal.        Right eye: No discharge.        Left eye: No discharge.     Conjunctiva/sclera: Conjunctivae normal.     Pupils: Pupils are equal, round, and reactive to light.  Neck:     Thyroid: No thyromegaly.     Vascular: No carotid bruit.  Cardiovascular:     Rate and Rhythm: Normal rate and regular rhythm.     Pulses:          Dorsalis pedis pulses are 1+ on the right  side and 1+ on the left side.       Posterior tibial pulses are 1+ on the right side and 1+ on the left side.     Heart sounds: Normal heart sounds. No murmur heard.    No gallop.  Pulmonary:     Effort: Pulmonary effort is normal. No accessory  muscle usage or respiratory distress.     Breath sounds: Normal breath sounds.  Abdominal:     General: Bowel sounds are normal.     Palpations: Abdomen is soft. There is no hepatomegaly or splenomegaly.  Musculoskeletal:     Cervical back: Normal range of motion and neck supple.     Right lower leg: No edema.     Left lower leg: No edema.  Feet:     Right foot:     Protective Sensation: 10 sites tested.  5 sites sensed.     Skin integrity: Dry skin present.     Toenail Condition: Right toenails are normal.     Left foot:     Protective Sensation: 10 sites tested.  5 sites sensed.     Skin integrity: Dry skin present.     Toenail Condition: Left toenails are normal.  Lymphadenopathy:     Cervical: No cervical adenopathy.  Skin:    General: Skin is warm and dry.     Findings: Bruising present.          Comments: Multiple scattered bruises to bilateral shins and forearms noted.  Neurological:     Mental Status: She is alert and oriented to person, place, and time.  Psychiatric:        Attention and Perception: Attention normal.        Mood and Affect: Mood normal.        Speech: Speech normal.        Behavior: Behavior normal. Behavior is cooperative.        Thought Content: Thought content normal.       Latest Ref Rng & Units 04/25/2022   11:33 AM 01/06/2022    9:47 AM 10/13/2019    3:57 PM  CBC  WBC 4.0 - 10.5 K/uL 8.9  5.3  6.4   Hemoglobin 12.0 - 15.0 g/dL 12.9  9.9  14.1   Hematocrit 36.0 - 46.0 % 40.4  31.2  41.3   Platelets 150 - 400 K/uL 252  272  239        Latest Ref Rng & Units 04/25/2022   11:33 AM 10/13/2019    7:45 PM 10/13/2019    3:57 PM  CMP  Glucose 70 - 99 mg/dL 113  79  88   BUN 8 - 23 mg/dL 46  27  28   Creatinine 0.44 - 1.00 mg/dL 1.84  1.14  1.06   Sodium 135 - 145 mmol/L 134  138  134   Potassium 3.5 - 5.1 mmol/L 4.7  5.5  5.6   Chloride 98 - 111 mmol/L 104  103  100   CO2 22 - 32 mmol/L '19  25  27   '$ Calcium 8.9 - 10.3 mg/dL 9.1  9.7  9.4    Total Protein 6.5 - 8.1 g/dL 6.4   7.2   Total Bilirubin 0.3 - 1.2 mg/dL 0.7   0.7   Alkaline Phos 38 - 126 U/L 62   62   AST 15 - 41 U/L 29   28   ALT 0 - 44 U/L 14   19  Assessment & Plan:   Problem List Items Addressed This Visit       Cardiovascular and Mediastinum   Essential hypertension    Chronic, ongoing with BP at goal today for her age.  Recommend she monitor BP at least a few mornings a week at home and document.  DASH diet at home.  Continue current medication regimen and adjust as needed + collaboration with cardiology - recent note reviewed.  Labs today: CBC, CMP, TSH, urine ALB.  Urine ALB 25 June 2022.       Relevant Medications   furosemide (LASIX) 20 MG tablet   spironolactone (ALDACTONE) 25 MG tablet   Other Relevant Orders   Comprehensive metabolic panel   Moderate pulmonary hypertension ([place]) - Primary    Chronic, ongoing.  Followed by cardiology, continue current medication regimen as prescribed by them.  Recent notes reviewed.      Relevant Medications   furosemide (LASIX) 20 MG tablet   spironolactone (ALDACTONE) 25 MG tablet   Other Relevant Orders   CBC with Differential/Platelet   Senile purpura ([place])    Noted to upper and lower extremities.  Recommended to patient gentle cleansing daily with soap and water + application of lotion.  Monitor closely for skin breakdown or wounds, immediately notify provider if present.      Relevant Medications   furosemide (LASIX) 20 MG tablet   spironolactone (ALDACTONE) 25 MG tablet   Varicose veins of both lower extremities without ulcer or inflammation    Chronic, ongoing.  Has not seen vascular since 2020 and to return as needed.  Not wearing compression, recommend she wear daily on during day and off at night.  Currently with one vessel that has ruptured, able to stop bleeding today and cover with dressing (pressure, no stick).  May need to return to vascular in future.      Relevant Medications    furosemide (LASIX) 20 MG tablet   spironolactone (ALDACTONE) 25 MG tablet     Endocrine   Hypothyroidism, adult    Chronic, ongoing.  Continue current medication regimen and adjust as needed based on labs.  TSH, Free T4, and antibody testing today.      Relevant Orders   TSH   Thyroid peroxidase antibody   T4, free     Nervous and Auditory   Bilateral occipital neuralgia    Chronic, ongoing.  Continues to be followed by neurology.  Will continue this collaboration and medication regimen as ordered by them.  Recent note reviewed.      Relevant Medications   pregabalin (LYRICA) 75 MG capsule   Neuropathy    Chronic, ongoing.  Continues to be followed by neurology.  Will continue this collaboration and medication regimen as ordered by them.  Recent note reviewed.        Musculoskeletal and Integument   Osteopenia of left forearm    Chronic, ongoing, last DEXA 02/15/2017.  Will order repeat DEXA today and recommend she attain at same time as her mammogram, she has number to call and schedule for both.  Recommend Vitamin D3 2000 units daily and adequate calcium intake.  Labs today.      Relevant Orders   DG Bone Density   VITAMIN D 25 Hydroxy (Vit-D Deficiency, Fractures)     Genitourinary   Stage 3b chronic kidney disease ([place])    Noted on past labs, educated patient on this as she had questions and had not heard about it.  Currently CKD 3b.  Educated her on diet and fluid intake + current medications.  Recheck labs today.  Urin ALB 25 June 2022.      Relevant Orders   Microalbumin, Urine Waived (Completed)   CBC with Differential/Platelet   Comprehensive metabolic panel     Other   History of stroke    History of CVA reported.  Continue collaboration with neurology and current medication regimen for prevention.  Labs today.      Insomnia    Chronic, ongoing for years.  Previous PCP, Dr. Hall Fisher, placed her on Xanax many years ago and she still takes, reports she can not  sleep without it.  At this time will continue current regimen and adjust if possible.  Will discuss refills next visit and need for every 3 month visits + a controlled substance agreement and yearly UDS.      Iron deficiency anemia due to chronic blood loss    Chronic, ongoing.  Continue supplement at this time and recheck levels, adjust as needed.      Relevant Orders   Iron Binding Cap (TIBC)([facility])   Ferritin   Vitamin B12   Loss of memory    Noted on past diagnoses.  Overall stable today.  No memory medications.  Continue collaboration with neurology.  Labs today.  MMSE next visit.      Mixed hyperlipidemia    Chronic, ongoing.  Continue current medication regimen and adjust as needed.  Labs today.      Relevant Medications   furosemide (LASIX) 20 MG tablet   spironolactone (ALDACTONE) 25 MG tablet   Other Relevant Orders   Lipid Panel w/o Chol/HDL Ratio   Sensory ataxia    Chronic, ongoing.  Followed by neurology and PT, continue this collaboration.  Recent notes reviewed.      Unsteady gait    Uses walker at baseline.  Will place an order for a new walker for her with a wider seat -- send to medical supply.      Other Visit Diagnoses     Encounter for screening mammogram for malignant neoplasm of breast       Mammogram ordered today.   Relevant Orders   MM 3D SCREEN BREAST BILATERAL   Encounter to establish care       Introduced to practice and provider.       Return in about 4 weeks (around 07/14/2022) for HTN/HLD, NEUROPATHY.   Venita Lick, NP

## 2022-06-16 NOTE — Assessment & Plan Note (Signed)
History of CVA reported.  Continue collaboration with neurology and current medication regimen for prevention.  Labs today.

## 2022-06-16 NOTE — Assessment & Plan Note (Signed)
Chronic, ongoing.  Continue current medication regimen and adjust as needed based on labs.  TSH, Free T4, and antibody testing today.

## 2022-06-16 NOTE — Assessment & Plan Note (Signed)
Uses walker at baseline.  Will place an order for a new walker for her with a wider seat -- send to medical supply.

## 2022-06-16 NOTE — Assessment & Plan Note (Signed)
Chronic, ongoing.  Has not seen vascular since 2020 and to return as needed.  Not wearing compression, recommend she wear daily on during day and off at night.  Currently with one vessel that has ruptured, able to stop bleeding today and cover with dressing (pressure, no stick).  May need to return to vascular in future.

## 2022-06-16 NOTE — Assessment & Plan Note (Signed)
Noted to upper and lower extremities.  Recommended to patient gentle cleansing daily with soap and water + application of lotion.  Monitor closely for skin breakdown or wounds, immediately notify provider if present.

## 2022-06-16 NOTE — Addendum Note (Signed)
Addended by: Venita Lick on: 06/16/2022 03:35 PM   Modules accepted: Orders

## 2022-06-16 NOTE — Assessment & Plan Note (Signed)
Chronic, ongoing with BP at goal today for her age.  Recommend she monitor BP at least a few mornings a week at home and document.  DASH diet at home.  Continue current medication regimen and adjust as needed + collaboration with cardiology - recent note reviewed.  Labs today: CBC, CMP, TSH, urine ALB.  Urine ALB 25 June 2022.

## 2022-06-17 ENCOUNTER — Telehealth: Payer: Self-pay | Admitting: Nurse Practitioner

## 2022-06-17 ENCOUNTER — Encounter: Payer: Self-pay | Admitting: Nurse Practitioner

## 2022-06-17 LAB — COMPREHENSIVE METABOLIC PANEL
ALT: 13 IU/L (ref 0–32)
AST: 22 IU/L (ref 0–40)
Albumin/Globulin Ratio: 2.4 — ABNORMAL HIGH (ref 1.2–2.2)
Albumin: 4.6 g/dL (ref 3.7–4.7)
Alkaline Phosphatase: 78 IU/L (ref 44–121)
BUN/Creatinine Ratio: 35 — ABNORMAL HIGH (ref 12–28)
BUN: 53 mg/dL — ABNORMAL HIGH (ref 8–27)
Bilirubin Total: 0.4 mg/dL (ref 0.0–1.2)
CO2: 21 mmol/L (ref 20–29)
Calcium: 9.7 mg/dL (ref 8.7–10.3)
Chloride: 101 mmol/L (ref 96–106)
Creatinine, Ser: 1.53 mg/dL — ABNORMAL HIGH (ref 0.57–1.00)
Globulin, Total: 1.9 g/dL (ref 1.5–4.5)
Glucose: 80 mg/dL (ref 70–99)
Potassium: 5 mmol/L (ref 3.5–5.2)
Sodium: 136 mmol/L (ref 134–144)
Total Protein: 6.5 g/dL (ref 6.0–8.5)
eGFR: 33 mL/min/{1.73_m2} — ABNORMAL LOW (ref >59)

## 2022-06-17 LAB — LIPID PANEL W/O CHOL/HDL RATIO
Cholesterol, Total: 180 mg/dL (ref 100–199)
HDL: 68 mg/dL (ref >39)
LDL Chol Calc (NIH): 93 mg/dL (ref 0–99)
Triglycerides: 109 mg/dL (ref 0–149)
VLDL Cholesterol Cal: 19 mg/dL (ref 5–40)

## 2022-06-17 LAB — CBC WITH DIFFERENTIAL/PLATELET
Basophils Absolute: 0 10*3/uL (ref 0.0–0.2)
Basos: 1 %
EOS (ABSOLUTE): 0.2 10*3/uL (ref 0.0–0.4)
Eos: 3 %
Hematocrit: 38.2 % (ref 34.0–46.6)
Hemoglobin: 13 g/dL (ref 11.1–15.9)
Immature Grans (Abs): 0 10*3/uL (ref 0.0–0.1)
Immature Granulocytes: 0 %
Lymphocytes Absolute: 1.7 10*3/uL (ref 0.7–3.1)
Lymphs: 26 %
MCH: 32.2 pg (ref 26.6–33.0)
MCHC: 34 g/dL (ref 31.5–35.7)
MCV: 95 fL (ref 79–97)
Monocytes Absolute: 0.6 10*3/uL (ref 0.1–0.9)
Monocytes: 9 %
Neutrophils Absolute: 3.9 10*3/uL (ref 1.4–7.0)
Neutrophils: 61 %
Platelets: 278 10*3/uL (ref 150–450)
RBC: 4.04 x10E6/uL (ref 3.77–5.28)
RDW: 12.7 % (ref 11.7–15.4)
WBC: 6.4 10*3/uL (ref 3.4–10.8)

## 2022-06-17 LAB — VITAMIN D 25 HYDROXY (VIT D DEFICIENCY, FRACTURES): Vit D, 25-Hydroxy: 37.8 ng/mL (ref 30.0–100.0)

## 2022-06-17 LAB — T4, FREE: Free T4: 1.45 ng/dL (ref 0.82–1.77)

## 2022-06-17 LAB — IRON AND TIBC
Iron Saturation: 22 % (ref 15–55)
Iron: 82 ug/dL (ref 27–139)
Total Iron Binding Capacity: 375 ug/dL (ref 250–450)
UIBC: 293 ug/dL (ref 118–369)

## 2022-06-17 LAB — VITAMIN B12: Vitamin B-12: 513 pg/mL (ref 232–1245)

## 2022-06-17 LAB — TSH: TSH: 2 u[IU]/mL (ref 0.450–4.500)

## 2022-06-17 LAB — FERRITIN: Ferritin: 74 ng/mL (ref 15–150)

## 2022-06-17 LAB — THYROID PEROXIDASE ANTIBODY: Thyroperoxidase Ab SerPl-aCnc: 13 IU/mL (ref 0–34)

## 2022-06-17 NOTE — Progress Notes (Signed)
Good morning, please let Tracie Fisher know her labs have returned and everything looks great with exception of ongoing kidney disease.  We will discuss further next visit and may discuss sending her to a kidney doctor for further recommendations and monitoring.  I want her to continue all current medications and ensure she is taking in good water daily.  Do not use any Ibuprofen products for pain or injury. Use Tylenol only.  Any questions? Keep being amazing!!  Thank you for allowing me to participate in your care.  I appreciate you. Kindest regards, Ksean Vale

## 2022-06-17 NOTE — Telephone Encounter (Signed)
Referrals should be reaching out to her about this

## 2022-06-17 NOTE — Telephone Encounter (Signed)
Explained the referrals process to the patient. She verbalized understanding and has no further questions at this time

## 2022-06-17 NOTE — Telephone Encounter (Signed)
Copied from [place] [phone]. Topic: Appointment Scheduling - Scheduling Inquiry for Clinic >> Jun 17, 2022 10:21 AM Chapman Fitch wrote: Reason for CRM: Pt called inquiring about the appt she is needing with a vein specialist  about the issue the provider noticed during her appt yesterday / please advise

## 2022-07-11 ENCOUNTER — Other Ambulatory Visit: Payer: Self-pay | Admitting: Nurse Practitioner

## 2022-07-11 ENCOUNTER — Telehealth: Payer: Self-pay | Admitting: Nurse Practitioner

## 2022-07-11 ENCOUNTER — Encounter: Payer: Self-pay | Admitting: Oncology

## 2022-07-11 DIAGNOSIS — D500 Iron deficiency anemia secondary to blood loss (chronic): Secondary | ICD-10-CM

## 2022-07-11 DIAGNOSIS — R1010 Upper abdominal pain, unspecified: Secondary | ICD-10-CM

## 2022-07-11 MED ORDER — LEVOTHYROXINE SODIUM 50 MCG PO TABS: 50.0000 ug | ORAL_TABLET | Freq: Every day | ORAL | 4 refills | Status: DC

## 2022-07-11 MED ORDER — ALPRAZOLAM 1 MG PO TABS: 0.5000 mg | ORAL_TABLET | Freq: Two times a day (BID) | ORAL | 2 refills | Status: DC

## 2022-07-11 NOTE — Telephone Encounter (Signed)
Medication Refill - Medication: levothyroxine (SYNTHROID, LEVOTHROID) 50 MCG tablet and ALPRAZolam (XANAX) 1 MG tablet   Has the patient contacted their pharmacy? Yes.   Pt told to contact provider  Preferred Pharmacy (with phone number or street name):  [facility], [address] Phone: [phone]  Fax: [phone]     Has the patient been seen for an appointment in the last year OR does the patient have an upcoming appointment? Yes.    Agent: Please be advised that RX refills may take up to 3 business days. We ask that you follow-up with your pharmacy.

## 2022-07-11 NOTE — Telephone Encounter (Signed)
Pt came to the office to see if these medications can be called in since she was here in the area.  Jolene is aware and will send the Rx's in.  Pt state that she doesn't mind waiting until they are sent in and she doesn't have anyone that can come with her to her appointment on Friday 07/15/2022 (son works 69 at [employer] and Sister is 23 and lives with her son and neither one of them are able to come with her) and she just got her driver's licenses renewed for another 5 years and she was so happy of that (b/c she is a very independent person).

## 2022-07-13 NOTE — Telephone Encounter (Signed)
Patient called and stated that she would like xanax refilled for 45 pills instead of 30 pills. Patient takes 0.5 mg in the morning and 1 mg at night. She states that she dose not have enough medication to last her the whole month. Please advise patient if prescription can be changed.

## 2022-07-14 MED ORDER — ALPRAZOLAM 1 MG PO TABS: ORAL_TABLET | ORAL | 0 refills | Status: DC

## 2022-07-14 NOTE — Addendum Note (Signed)
Addended by: Venita Lick on: 07/14/2022 03:57 PM   Modules accepted: Orders

## 2022-07-15 ENCOUNTER — Ambulatory Visit (INDEPENDENT_AMBULATORY_CARE_PROVIDER_SITE_OTHER): Payer: 59 | Admitting: Nurse Practitioner

## 2022-07-15 ENCOUNTER — Encounter: Payer: Self-pay | Admitting: Nurse Practitioner

## 2022-07-15 VITALS — BP 109/71 | HR 62 | Temp 97.6°F | Ht 60.98 in | Wt 154.8 lb

## 2022-07-15 DIAGNOSIS — R413 Other amnesia: Secondary | ICD-10-CM

## 2022-07-15 DIAGNOSIS — E782 Mixed hyperlipidemia: Secondary | ICD-10-CM | POA: Diagnosis not present

## 2022-07-15 DIAGNOSIS — F5101 Primary insomnia: Secondary | ICD-10-CM

## 2022-07-15 DIAGNOSIS — I10 Essential (primary) hypertension: Secondary | ICD-10-CM

## 2022-07-15 MED ORDER — ALPRAZOLAM 1 MG PO TABS: ORAL_TABLET | ORAL | 2 refills | Status: DC

## 2022-07-15 NOTE — Assessment & Plan Note (Signed)
Noted on past diagnoses, 6CIT 12 today, difficulty with recall and short term.  No current memory medications, may benefit from this in past.  Continue collaboration with neurology.  Labs up to date.  CCM referral place to assist in monitoring patient with PCP.  May benefit imaging in past.  Have requested she bring a family member or friend to visit, but she reports they all work and are unable to attend.  This may be beneficial to have at visits though to help her with recall.

## 2022-07-15 NOTE — Assessment & Plan Note (Signed)
Chronic, ongoing.  Continue current medication regimen and adjust as needed.  Labs up to date.

## 2022-07-15 NOTE — Patient Instructions (Signed)

## 2022-07-15 NOTE — Assessment & Plan Note (Signed)
Chronic, ongoing with BP at goal today for her age.  Recommend she monitor BP at least a few mornings a week at home and document.  DASH diet at home.  Continue current medication regimen and adjust as needed + collaboration with cardiology - recent note reviewed.  Labs today: up to date  Urine ALB 25 June 2022.

## 2022-07-15 NOTE — Progress Notes (Signed)
BP 109/71   Pulse 62   Temp 97.6 F (36.4 C) (Oral)   Ht 5' 0.98" (1.549 m)   Wt 154 lb 12.8 oz (70.2 kg)   SpO2 93%   BMI 29.26 kg/m    Subjective:    Patient ID: Tracie Fisher, female    DOB: Sep 28, 1935, 87 y.o.   MRN: [id number]  HPI: Tracie Fisher is a 87 y.o. female  Chief Complaint  Patient presents with   Hypertension   Hyperlipidemia   Anxiety   INSOMNIA Has been on Xanax for many years, Dr. Hall Busing her previous PCP started years ago. She wishes to remain on it -- takes 1/2 tablet Xanax in morning and 1 tablet at night.  Pt is aware of risks of psychoactive medication use to include increased sedation, respiratory suppression, falls, extrapyramidal movements,  dependence and cardiovascular events.  Pt would like to continue treatment as benefit determined to outweigh risk.  Last filled on 07/11/22. Duration: chronic Satisfied with sleep quality: yes Difficulty falling asleep: yes Difficulty staying asleep: no Waking a few hours after sleep onset: no Early morning awakenings: no Daytime hypersomnolence: no Wakes feeling refreshed: no Good sleep hygiene: yes Apnea: no Snoring: no Depressed/anxious mood: no Recent stress: no Restless legs/nocturnal leg cramps: no Chronic pain/arthritis: yes History of sleep study: no Treatments attempted:  Xanax and benadryl    HYPERTENSION / HYPERLIPIDEMIA Taking Metoprolol, Losartan, Lasix, Plavix, Spironolactone, and Pravastatin. Moderate pulmonary hypertension. Followed by cardiology, last saw Dr. Ubaldo Glassing 12/13/21, last echo June 2022 EF 55-60% -- returns 12/14/22 for visit.   History of stroke years ago, last MRI on 03/17/20 did note chronic insults to left frontal lobe, right internal capsule, and left thalamus + right frontal vertex micro hemorrhage -- all unchanged from previous.  Is scheduled to see neurology on 10/05/22. Satisfied with current treatment? yes Duration of hypertension: chronic BP monitoring frequency:  rarely BP range:  BP medication side effects: no Duration of hyperlipidemia: chronic Cholesterol medication side effects: no Cholesterol supplements: none Medication compliance: good compliance Aspirin: no Recent stressors: no Recurrent headaches: no Visual changes: no Palpitations: no Dyspnea: no Chest pain: no Lower extremity edema: no Dizzy/lightheaded: no     07/15/2022   11:31 AM  6CIT Screen  What Year? 0 points  What month? 0 points  What time? 0 points  Count back from 20 0 points  Months in reverse 4 points  Repeat phrase 8 points  Total Score 12 points   Relevant past medical, surgical, family and social history reviewed and updated as indicated. Interim medical history since our last visit reviewed. Allergies and medications reviewed and updated.  Review of Systems  Constitutional:  Negative for activity change, appetite change, diaphoresis, fatigue and fever.  Respiratory:  Negative for cough, chest tightness and shortness of breath.   Cardiovascular:  Negative for chest pain, palpitations and leg swelling.  Gastrointestinal: Negative.   Neurological: Negative.   Psychiatric/Behavioral:  Positive for sleep disturbance. Negative for decreased concentration, self-injury and suicidal ideas.     Per HPI unless specifically indicated above     Objective:    BP 109/71   Pulse 62   Temp 97.6 F (36.4 C) (Oral)   Ht 5' 0.98" (1.549 m)   Wt 154 lb 12.8 oz (70.2 kg)   SpO2 93%   BMI 29.26 kg/m   Wt Readings from Last 3 Encounters:  07/15/22 154 lb 12.8 oz (70.2 kg)  06/16/22 151 lb 9.6 oz (68.8  kg)  04/25/22 154 lb (69.9 kg)    Physical Exam Vitals and nursing note reviewed.  Constitutional:      General: She is awake. She is not in acute distress.    Appearance: She is well-developed and well-groomed. She is not ill-appearing or toxic-appearing.  HENT:     Head: Normocephalic.     Right Ear: Hearing and external ear normal.     Left Ear: Hearing and  external ear normal.  Eyes:     General: Lids are normal.        Right eye: No discharge.        Left eye: No discharge.     Conjunctiva/sclera: Conjunctivae normal.     Pupils: Pupils are equal, round, and reactive to light.  Neck:     Thyroid: No thyromegaly.     Vascular: No carotid bruit or JVD.  Cardiovascular:     Rate and Rhythm: Normal rate and regular rhythm.     Heart sounds: Normal heart sounds. No murmur heard.    No gallop.  Pulmonary:     Effort: Pulmonary effort is normal.     Breath sounds: Normal breath sounds.  Abdominal:     General: Bowel sounds are normal. There is no distension.     Palpations: Abdomen is soft.     Tenderness: There is no abdominal tenderness.  Musculoskeletal:     Cervical back: Normal range of motion and neck supple.     Right lower leg: No edema.     Left lower leg: No edema.  Lymphadenopathy:     Cervical: No cervical adenopathy.  Skin:    General: Skin is warm and dry.  Neurological:     Mental Status: She is alert.     Comments: Oriented to year, month, President.  Not to day of week.  Psychiatric:        Attention and Perception: Attention normal.        Mood and Affect: Mood normal.        Speech: Speech normal.        Behavior: Behavior normal. Behavior is cooperative.        Thought Content: Thought content normal.        Cognition and Memory: Memory is impaired.    Results for orders placed or performed in visit on 06/16/22  Microalbumin, Urine Waived  Result Value Ref Range   Microalb, Ur Waived 10 0 - 19 mg/L   Creatinine, Urine Waived 10 10 - 300 mg/dL   Microalb/Creat Ratio 30-300 (H) <30 mg/g  CBC with Differential/Platelet  Result Value Ref Range   WBC 6.4 3.4 - 10.8 x10E3/uL   RBC 4.04 3.77 - 5.28 x10E6/uL   Hemoglobin 13.0 11.1 - 15.9 g/dL   Hematocrit 38.2 34.0 - 46.6 %   MCV 95 79 - 97 fL   MCH 32.2 26.6 - 33.0 pg   MCHC 34.0 31.5 - 35.7 g/dL   RDW 12.7 11.7 - 15.4 %   Platelets 278 150 - 450 x10E3/uL    Neutrophils 61 Not Estab. %   Lymphs 26 Not Estab. %   Monocytes 9 Not Estab. %   Eos 3 Not Estab. %   Basos 1 Not Estab. %   Neutrophils Absolute 3.9 1.4 - 7.0 x10E3/uL   Lymphocytes Absolute 1.7 0.7 - 3.1 x10E3/uL   Monocytes Absolute 0.6 0.1 - 0.9 x10E3/uL   EOS (ABSOLUTE) 0.2 0.0 - 0.4 x10E3/uL   Basophils Absolute 0.0 0.0 -  0.2 x10E3/uL   Immature Granulocytes 0 Not Estab. %   Immature Grans (Abs) 0.0 0.0 - 0.1 x10E3/uL  Comprehensive metabolic panel  Result Value Ref Range   Glucose 80 70 - 99 mg/dL   BUN 53 (H) 8 - 27 mg/dL   Creatinine, Ser 1.53 (H) 0.57 - 1.00 mg/dL   eGFR 33 (L) >59 mL/min/1.73   BUN/Creatinine Ratio 35 (H) 12 - 28   Sodium 136 134 - 144 mmol/L   Potassium 5.0 3.5 - 5.2 mmol/L   Chloride 101 96 - 106 mmol/L   CO2 21 20 - 29 mmol/L   Calcium 9.7 8.7 - 10.3 mg/dL   Total Protein 6.5 6.0 - 8.5 g/dL   Albumin 4.6 3.7 - 4.7 g/dL   Globulin, Total 1.9 1.5 - 4.5 g/dL   Albumin/Globulin Ratio 2.4 (H) 1.2 - 2.2   Bilirubin Total 0.4 0.0 - 1.2 mg/dL   Alkaline Phosphatase 78 44 - 121 IU/L   AST 22 0 - 40 IU/L   ALT 13 0 - 32 IU/L  Lipid Panel w/o Chol/HDL Ratio  Result Value Ref Range   Cholesterol, Total 180 100 - 199 mg/dL   Triglycerides 109 0 - 149 mg/dL   HDL 68 >39 mg/dL   VLDL Cholesterol Cal 19 5 - 40 mg/dL   LDL Chol Calc (NIH) 93 0 - 99 mg/dL  TSH  Result Value Ref Range   TSH 2.000 0.450 - 4.500 uIU/mL  Thyroid peroxidase antibody  Result Value Ref Range   Thyroperoxidase Ab SerPl-aCnc 13 0 - 34 IU/mL  T4, free  Result Value Ref Range   Free T4 1.45 0.82 - 1.77 ng/dL  VITAMIN D 25 Hydroxy (Vit-D Deficiency, Fractures)  Result Value Ref Range   Vit D, 25-Hydroxy 37.8 30.0 - 100.0 ng/mL  Iron Binding Cap (TIBC)([facility])  Result Value Ref Range   Total Iron Binding Capacity 375 250 - 450 ug/dL   UIBC 293 118 - 369 ug/dL   Iron 82 27 - 139 ug/dL   Iron Saturation 22 15 - 55 %  Ferritin  Result Value Ref Range   Ferritin 74  15 - 150 ng/mL  Vitamin B12  Result Value Ref Range   Vitamin B-12 513 232 - 1,245 pg/mL      Assessment & Plan:   Problem List Items Addressed This Visit       Cardiovascular and Mediastinum   Essential hypertension    Chronic, ongoing with BP at goal today for her age.  Recommend she monitor BP at least a few mornings a week at home and document.  DASH diet at home.  Continue current medication regimen and adjust as needed + collaboration with cardiology - recent note reviewed.  Labs today: up to date  Urine ALB 25 June 2022.         Other   Insomnia    Chronic, ongoing for years.  Previous PCP, Dr. Hall Busing, placed her on Xanax many years ago and she still takes, reports she can not sleep without it.  At this time will continue current regimen and adjust if possible.  Will discuss need for every 3 month visits + a controlled substance agreement and yearly UDS at next visit.  Return in 3 months.      Relevant Medications   ALPRAZolam (XANAX) 1 MG tablet (Start on 08/09/2022)   Loss of memory - Primary    Noted on past diagnoses, 6CIT 12 today, difficulty with recall and short term.  No current memory medications, may benefit from this in past.  Continue collaboration with neurology.  Labs up to date.  CCM referral place to assist in monitoring patient with PCP.  May benefit imaging in past.  Have requested she bring a family member or friend to visit, but she reports they all work and are unable to attend.  This may be beneficial to have at visits though to help her with recall.      Relevant Orders   AMB Referral to Chronic Care Management Services   Mixed hyperlipidemia    Chronic, ongoing.  Continue current medication regimen and adjust as needed.  Labs up to date.        Follow up plan: Return in about 3 months (around 10/15/2022) for INSOMNIA, MEMORY CHANGES, HTN/HLD.

## 2022-07-15 NOTE — Assessment & Plan Note (Signed)
Chronic, ongoing for years.  Previous PCP, Dr. Hall Busing, placed her on Xanax many years ago and she still takes, reports she can not sleep without it.  At this time will continue current regimen and adjust if possible.  Will discuss need for every 3 month visits + a controlled substance agreement and yearly UDS at next visit.  Return in 3 months.

## 2022-07-21 ENCOUNTER — Telehealth: Payer: Self-pay

## 2022-07-21 NOTE — Addendum Note (Signed)
Addended by: Marnee Guarneri T on: 07/21/2022 11:58 AM   Modules accepted: Orders

## 2022-07-21 NOTE — Progress Notes (Signed)
  Chronic Care Management   Note  [id number] Name: Tracie Fisher MRN: [id number] DOB: [id number]  Tracie Fisher is a 87 y.o. year old female who is a primary care patient of Cannady, Barbaraann Faster, NP. I reached out to [name] by phone today in response to a referral sent by [name] PCP.  The first contact attempt was unsuccessful.   Follow up plan: Additional outreach attempts will be made.  [name], [place], [place] [address] Direct Dial: [phone] [name]

## 2022-07-22 ENCOUNTER — Telehealth: Payer: Self-pay | Admitting: Nurse Practitioner

## 2022-07-22 NOTE — Telephone Encounter (Signed)
Copied from [place] [id number]. Topic: General - Inquiry >> Jul 21, 2022  4:31 PM Penni Bombard wrote: Reason for CRM: Helene Kelp with [employer] saying they cannot provide the Rollator because she has had one within 5 years and medicaid will not pay .  They will advise patient.

## 2022-07-27 ENCOUNTER — Encounter: Payer: Self-pay | Admitting: Oncology

## 2022-07-28 NOTE — Progress Notes (Signed)
  Chronic Care Management   Note  1/59/4585 Name: KRISTILYN COLTRANE MRN: [id number] DOB: [birth year]  HARTLEE AMEDEE is a 87 y.o. year old female who is a primary care patient of Cannady, Barbaraann Faster, NP. I reached out to [name] by phone today in response to a referral sent by Ms. [place] PCP.  Ms. Blackstock was given information about Chronic Care Management services today including:  CCM service includes personalized support from designated clinical staff supervised by the physician, including individualized plan of care and coordination with other care providers 24/7 contact phone numbers for assistance for urgent and routine care needs. Service will only be billed when office clinical staff spend 20 minutes or more in a month to coordinate care. Only one practitioner may furnish and bill the service in a calendar month. The patient may stop CCM services at amy time (effective at the end of the month) by phone call to the office staff. The patient will be responsible for cost sharing (co-pay) or up to 20% of the service fee (after annual deductible is met)  Ms. [name]  agreedto scheduling an appointment with the CCM RN Case Manager   Follow up plan: Patient agreed to scheduled appointment with RN Case Manager on 08/02/2022(date/time).   [name], [place], [place] [address] Direct Dial: [phone] [email or web address]

## 2022-07-31 ENCOUNTER — Encounter: Payer: Self-pay | Admitting: Oncology

## 2022-08-02 ENCOUNTER — Ambulatory Visit (INDEPENDENT_AMBULATORY_CARE_PROVIDER_SITE_OTHER): Payer: 59

## 2022-08-02 ENCOUNTER — Telehealth: Payer: 59

## 2022-08-02 DIAGNOSIS — I272 Pulmonary hypertension, unspecified: Secondary | ICD-10-CM

## 2022-08-02 DIAGNOSIS — R413 Other amnesia: Secondary | ICD-10-CM

## 2022-08-02 DIAGNOSIS — I10 Essential (primary) hypertension: Secondary | ICD-10-CM

## 2022-08-02 NOTE — Chronic Care Management (AMB) (Signed)
Chronic Care Management   CCM RN Visit Note  [id number] Name: Tracie Fisher MRN: [id number] DOB: [id number]  Subjective: Tracie Fisher is a 87 y.o. year old female who is a primary care patient of Cannady, Barbaraann Faster, NP. The patient was referred to the Chronic Care Management team for assistance with care management needs subsequent to provider initiation of CCM services and plan of care.    Today's Visit:  Engaged with patient by telephone for initial visit.     SDOH Interventions Today    Flowsheet Row Most Recent Value  SDOH Interventions   Food Insecurity Interventions Intervention Not Indicated  Transportation Interventions Intervention Not Indicated  Utilities Interventions Intervention Not Indicated  Alcohol Usage Interventions Intervention Not Indicated (Score <7)  Financial Strain Interventions Intervention Not Indicated  Physical Activity Interventions Intervention Not Indicated, Other (Comments)  [encouraged activity to help with strengthening]  Stress Interventions Intervention Not Indicated  Social Connections Interventions Intervention Not Indicated  [has good support from her family per the patient]         Goals Addressed             This Visit's Progress    CCM Expected Outcome:  Monitor, Self-Manage and Reduce Symptoms of: Memory changes       Current Barriers:  Chronic Disease Management support and education needs related to memory changes   Planned Interventions: Evaluation of current treatment plan related to dementia or memory changes and patient's adherence to plan as established by provider Advised patient to call the office for changes in memory, increased anxiety or acute changes in mental status Provided education to patient re: safety in the home, falls preventions, support system, and medication safety Reviewed medications with patient and discussed compliance. The patient keeps her medications in their original bottles and she has them set  up where she remembers to take them each morning. She denies any issues with making sure she takes her medications as directed. Review of the ability for pharmacy support if needed. The patient denies any needs at this time related to medication needs. Will continue to monitor.  Reviewed scheduled/upcoming provider appointments including 10-18-2022 at 73 am Discussed plans with patient for ongoing care management follow up and provided patient with direct contact information for care management team Advised patient to discuss changes in her memory, new questions or concerns about her health and well being with provider Screening for signs and symptoms of depression related to chronic disease state  Assessed social determinant of health barriers The patient denies any falls at this time, she uses a rollator walker, does have to pace her activity and rest at times, uses a wheelchair sometimes when she goes to different places. Did lose her food stamp card and went earlier today to get assistance with a replacement. She still drives and takes care of herself. She states her family is available if needed. Her son Kasandra Knudsen always comes on Sundays and they go out to eat or she fixes something for them to eat. She also has 2 sisters that she talks to on a regular basis. She has several friends at the apartment that she lives at. She feels she is doing well to be 87 years old. Review of the availability of the RNCM to help support her with her health and well being. The patient has the [facility] number to call for needs in between outreaches.   Symptom Management: Take medications as prescribed   Attend all scheduled provider appointments Call  provider office for new concerns or questions  call the Suicide and Crisis Lifeline: 988 call the [place] National Suicide Prevention Lifeline: (617)042-2612 or TTY: (806) 266-1757 TTY 709-146-5815) to talk to a trained counselor call 1-800-273-TALK (toll free, 24 hour hotline) if  experiencing a Mental Health or [place]   Follow Up Plan: Telephone follow up appointment with care management team member scheduled for: 09-05-2022 at 230 pm       CCM Expected Outcome:  Monitor, Self-Manage, and Reduce Symptoms of Hypertension       Current Barriers:  Chronic Disease Management support and education needs related to effective management of HTN  Planned Interventions: Evaluation of current treatment plan related to hypertension self management and patient's adherence to plan as established by provider. Denies any issues with her blood pressures or heart health;   Provided education to patient re: stroke prevention, s/s of heart attack and stroke; Reviewed prescribed diet heart healthy diet Reviewed medications with patient and discussed importance of compliance;  Discussed plans with patient for ongoing care management follow up and provided patient with direct contact information for care management team; Advised patient, providing education and rationale, to monitor blood pressure daily and record, calling PCP for findings outside established parameters;  Reviewed scheduled/upcoming provider appointments including: 10-18-2022 at 54 am Advised patient to discuss changes in HTN or heart health with provider; Provided education on prescribed diet heart healthy diet;  Discussed complications of poorly controlled blood pressure such as heart disease, stroke, circulatory complications, vision complications, kidney impairment, sexual dysfunction;  Screening for signs and symptoms of depression related to chronic disease state;  Assessed social determinant of health barriers;   Symptom Management: Take medications as prescribed   Attend all scheduled provider appointments Call provider office for new concerns or questions  call the Suicide and Crisis Lifeline: 988 call the [place] National Suicide Prevention Lifeline: (901) 125-2261 or TTY: 215 093 3137 TTY  (351)765-8064) to talk to a trained counselor call 1-800-273-TALK (toll free, 24 hour hotline) if experiencing a Mental Health or [place]  check blood pressure weekly learn about high blood pressure call doctor for signs and symptoms of high blood pressure develop an action plan for high blood pressure keep all doctor appointments take medications for blood pressure exactly as prescribed report new symptoms to your doctor  Follow Up Plan: Telephone follow up appointment with care management team member scheduled for: 09-05-2022 at 230 pm       CCM:  Maintain, Monitor and Self-Manage Symptoms of COPD       Current Barriers:  Chronic Disease Management support and education needs related to effective management of COPD  Planned Interventions: Provided patient with basic written and verbal COPD education on self care/management/and exacerbation prevention. The patient states she can tell when her allergies and sinuses are acting up. Denies any acute changes at this time. Advised patient to track and manage COPD triggers. Review of triggers that may exacerbate her conditions. She paces her activity and rest when she needs to do so. Provided written and verbal instructions on pursed lip breathing and utilized returned demonstration as teach back Provided instruction about proper use of medications used for management of COPD including inhalers Advised patient to self assesses COPD action plan zone and make appointment with provider if in the yellow zone for 48 hours without improvement Provided education about and advised patient to utilize infection prevention strategies to reduce risk of respiratory infection Discussed the importance of adequate rest and management of  fatigue with COPD Screening for signs and symptoms of depression related to chronic disease state  Assessed social determinant of health barriers  Symptom Management: Take medications as prescribed   Attend  all scheduled provider appointments Call provider office for new concerns or questions  call the Suicide and Crisis Lifeline: 988 call the [place] National Suicide Prevention Lifeline: 810-546-6611 or TTY: 204-743-8680 TTY 3104307793) to talk to a trained counselor call 1-800-273-TALK (toll free, 24 hour hotline) if experiencing a Mental Health or [place]  avoid second hand smoke eliminate smoking in my home identify and remove indoor air pollutants limit outdoor activity during cold weather listen for public air quality announcements every day do breathing exercises every day develop a rescue plan follow rescue plan if symptoms flare-up  Follow Up Plan: Telephone follow up appointment with care management team member scheduled for: 09-05-2022 at 230 pm          Plan:Telephone follow up appointment with care management team member scheduled for:  09-05-2022 at 6 pm  [place], MSN, CCM RN Care Manager  Chronic Care Management Direct Number: [phone]

## 2022-08-02 NOTE — Plan of Care (Signed)
Chronic Care Management Provider Comprehensive Care Plan    [id number] Name: Tracie Fisher MRN: [id number] DOB: 08/01/1935  Referral to Chronic Care Management (CCM) services was placed by Provider:  Marnee Guarneri, NP on Date: 07-15-2022.  Chronic Condition 1: COPD Provider Assessment and Plan  Chronic, ongoing.  Followed by cardiology, continue current medication regimen as prescribed by them.  Recent notes reviewed.         Relevant Medications    furosemide (LASIX) 20 MG tablet    spironolactone (ALDACTONE) 25 MG tablet    Other Relevant Orders    CBC with Differential/Platelet     Expected Outcome/Goals Addressed This Visit (Provider CCM goals/Provider Assessment and plan    CCM (COPD) EXPECTED OUTCOME: MONITOR, SELF-MANAGE AND REDUCE SYMPTOMS OF COPD    Symptom Management Condition 1: Take all medications as prescribed Attend all scheduled provider appointments Call provider office for new concerns or questions  call the Suicide and Crisis Lifeline: 988 call the [place] National Suicide Prevention Lifeline: 631 671 7174 or TTY: 340-863-0205 TTY 418-765-6963) to talk to a trained counselor call 1-800-273-TALK (toll free, 24 hour hotline) if experiencing a Mental Health or [place]  identify and remove indoor air pollutants limit outdoor activity during cold weather listen for public air quality announcements every day do breathing exercises every day follow rescue plan if symptoms flare-up  Chronic Condition 2: HTN Provider Assessment and Plan  Chronic, ongoing with BP at goal today for her age.  Recommend she monitor BP at least a few mornings a week at home and document.  DASH diet at home.  Continue current medication regimen and adjust as needed + collaboration with cardiology - recent note reviewed.  Labs today: up to date  Urine ALB 25 June 2022.              Expected Outcome/Goals Addressed This Visit (Provider CCM goals/Provider  Assessment and plan     CCM (HYPERTENSION)  EXPECTED OUTCOME:  MONITOR,SELF- MANAGE AND REDUCE SYMPTOMS OF HYPERTENSION    Symptom Management Condition 2: Take all medications as prescribed Attend all scheduled provider appointments Call provider office for new concerns or questions  call the Suicide and Crisis Lifeline: 988 call the [place] National Suicide Prevention Lifeline: 807-839-9772 or TTY: (956) 417-6630 TTY 515-852-1141) to talk to a trained counselor call 1-800-273-TALK (toll free, 24 hour hotline) if experiencing a Mental Health or [place]  check blood pressure weekly learn about high blood pressure keep all doctor appointments take medications for blood pressure exactly as prescribed report new symptoms to your doctor  Chronic Condition 3: Memory Changes/Dementia Provider Assessment and Plan  Noted on past diagnoses, 6CIT 12 today, difficulty with recall and short term.  No current memory medications, may benefit from this in past.  Continue collaboration with neurology.  Labs up to date.  CCM referral place to assist in monitoring patient with PCP.  May benefit imaging in past.  Have requested she bring a family member or friend to visit, but she reports they all work and are unable to attend.  This may be beneficial to have at visits though to help her with recall.         Relevant Orders    AMB Referral to Chronic Care Management Services     Expected Outcome/Goals Addressed This Visit (Provider CCM goals/Provider Assessment and plan   CCM (Memory Changes/Dementia) EXPECTED OUTCOME: MONITOR, SELF-MANAGE AND REDUCE SYMPTOMS OF Memory Changes/Dementia   Symptom Management Condition 2: Take all  medications as prescribed Attend all scheduled provider appointments Call provider office for new concerns or questions  call the Suicide and Crisis Lifeline: 988 call the [place] National Suicide Prevention Lifeline: 2165027853 or TTY: 581-063-1025 TTY  ([phone])048-3803) to talk to a trained counselor call 1-800-273-TALK (toll free, 24 hour hotline) if experiencing a Mental Health or [place] [facility]   Problem List Patient Active Problem List   Diagnosis Date Noted   Senile purpura ([place]) 06/16/2022   Insomnia 06/16/2022   Varicose veins of both lower extremities without ulcer or inflammation 06/16/2022   Mixed hyperlipidemia 06/12/2022   Bilateral occipital neuralgia 06/12/2022   Sensory ataxia 06/12/2022   Osteopenia of left forearm 06/12/2022   CMC arthritis 05/23/2022   Stage 3b chronic kidney disease (CKD) ([place]) 03/03/2022   Hypothyroidism, adult 03/01/2022   Loss of memory 07/26/2021   Moderate pulmonary hypertension ([place] [place]) 05/20/2021   Primary osteoarthritis involving multiple joints 03/24/2021   Unsteady gait 10/16/2019   History of stroke 03/04/2019   Bilateral foot pain 03/06/2018   Chronic bilateral low back pain 03/06/2018   Neck pain 03/06/2018   Neuropathy 02/06/2018   History of nonmelanoma skin cancer 09/27/2016   Essential hypertension 09/02/2016   Hiatal hernia    Stricture of esophagus    Iron deficiency anemia due to chronic blood loss 03/23/2016   History of tachycardia 07/29/2015    Medication Management  Current Outpatient Medications:    [START ON 08/09/2022] ALPRAZolam (XANAX) 1 MG tablet, Take 0.5 mg (1/2 tablet) by mouth in the morning and 1 mg (1 tablet) by mouth at bedtime., Disp: 45 tablet, Rfl: 2   clopidogrel (PLAVIX) 75 MG tablet, Take by mouth., Disp: , Rfl:    cyanocobalamin (V-R VITAMIN B-12) 500 MCG tablet, Take 500 mcg by mouth daily. , Disp: , Rfl:    ferrous gluconate (FERGON) 240 (27 FE) MG tablet, Take by mouth., Disp: , Rfl:    furosemide (LASIX) 20 MG tablet, Take 20 mg by mouth 2 (two) times daily., Disp: , Rfl:    levothyroxine (SYNTHROID) 50 MCG tablet, Take 1 tablet (50 mcg total) by mouth daily before breakfast., Disp: 90 tablet, Rfl: 4   losartan (COZAAR) 50 MG tablet,  Take 50 mg daily by mouth., Disp: , Rfl:    metoprolol tartrate (LOPRESSOR) 25 MG tablet, Take 25 mg by mouth 2 (two) times daily., Disp: , Rfl:    Multiple Vitamin (MULTIVITAMIN WITH MINERALS) TABS tablet, Take 1 tablet by mouth daily., Disp: , Rfl:    omeprazole (PRILOSEC) 40 MG capsule, Take 40 mg by mouth., Disp: , Rfl:    pravastatin (PRAVACHOL) 20 MG tablet, Take by mouth., Disp: , Rfl:    pregabalin (LYRICA) 75 MG capsule, Take 75 mg by mouth 3 (three) times daily., Disp: , Rfl:    Probiotic Product (PROBIOTIC DAILY PO), Take by mouth daily., Disp: , Rfl:    psyllium (METAMUCIL) 58.6 % powder, Take 1 packet by mouth 3 (three) times daily. , Disp: , Rfl:    spironolactone (ALDACTONE) 25 MG tablet, Take 1 tablet by mouth daily., Disp: , Rfl:   Cognitive Assessment Identity Confirmed: : Name; DOB Cognitive Status: Normal   Functional Assessment Hearing Difficulty or Deaf: yes Hearing Management: hard of hearing, does not wear hearing aides Wear Glasses or Blind: yes Vision Management: wears glasses Concentrating, Remembering or Making Decisions Difficulty (CP): no (the patient states she has no concerns with memory- lost her food stamp card) Difficulty Communicating: no Difficulty Eating/Swallowing:  no Walking or Climbing Stairs Difficulty: yes Walking or Climbing Stairs: ambulation difficulty, requires equipment Mobility Management: has a rollator walker, uses a wheelchair at times Dressing/Bathing Difficulty: no Doing Errands Independently Difficulty (such as shopping) (CP): no   Caregiver Assessment  Primary Source of Support/Comfort: child(ren); sibling(s) Name of Support/Comfort Primary Source: sister- Geni Bers and son-Danny People in Home: alone Family Caregiver if Needed: sibling(s); child(ren), adult Family Caregiver Names: Geni Bers- sister and Kasandra Knudsen- son Primary Roles/Responsibilities: retired   Planned Interventions  Provided patient with basic written and  verbal COPD education on self care/management/and exacerbation prevention. The patient states she can tell when her allergies and sinuses are acting up. Denies any acute changes at this time. Advised patient to track and manage COPD triggers. Review of triggers that may exacerbate her conditions. She paces her activity and rest when she needs to do so. Provided written and verbal instructions on pursed lip breathing and utilized returned demonstration as teach back Provided instruction about proper use of medications used for management of COPD including inhalers Advised patient to self assesses COPD action plan zone and make appointment with provider if in the yellow zone for 48 hours without improvement Provided education about and advised patient to utilize infection prevention strategies to reduce risk of respiratory infection Discussed the importance of adequate rest and management of fatigue with COPD Screening for signs and symptoms of depression related to chronic disease state  Assessed social determinant of health barriers Evaluation of current treatment plan related to hypertension self management and patient's adherence to plan as established by provider. Denies any issues with her blood pressures or heart health;   Provided education to patient re: stroke prevention, s/s of heart attack and stroke; Reviewed prescribed diet heart healthy diet Reviewed medications with patient and discussed importance of compliance;  Discussed plans with patient for ongoing care management follow up and provided patient with direct contact information for care management team; Advised patient, providing education and rationale, to monitor blood pressure daily and record, calling PCP for findings outside established parameters;  Reviewed scheduled/upcoming provider appointments including: 10-18-2022 at 81 am Advised patient to discuss changes in HTN or heart health with provider; Provided education on  prescribed diet heart healthy diet;  Discussed complications of poorly controlled blood pressure such as heart disease, stroke, circulatory complications, vision complications, kidney impairment, sexual dysfunction;  Screening for signs and symptoms of depression related to chronic disease state;  Assessed social determinant of health barriers;  Evaluation of current treatment plan related to dementia or memory changes and patient's adherence to plan as established by provider Advised patient to call the office for changes in memory, increased anxiety or acute changes in mental status Provided education to patient re: safety in the home, falls preventions, support system, and medication safety Reviewed medications with patient and discussed compliance. The patient keeps her medications in their original bottles and she has them set up where she remembers to take them each morning. She denies any issues with making sure she takes her medications as directed. Review of the ability for pharmacy support if needed. The patient denies any needs at this time related to medication needs. Will continue to monitor.  Reviewed scheduled/upcoming provider appointments including 10-18-2022 at 1040 am Discussed plans with patient for ongoing care management follow up and provided patient with direct contact information for care management team Advised patient to discuss changes in her memory, new questions or concerns about her health and well being with provider Screening for  signs and symptoms of depression related to chronic disease state  Assessed social determinant of health barriers The patient denies any falls at this time, she uses a rollator walker, does have to pace her activity and rest at times, uses a wheelchair sometimes when she goes to different places. Did lose her food stamp card and went earlier today to get assistance with a replacement. She still drives and takes care of herself. She states her  family is available if needed. Her son Kasandra Knudsen always comes on Sundays and they go out to eat or she fixes something for them to eat. She also has 2 sisters that she talks to on a regular basis. She has several friends at the apartment that she lives at. She feels she is doing well to be 87 years old. Review of the availability of the RNCM to help support her with her health and well being. The patient has the [facility] number to call for needs in between outreaches.        Interaction and coordination with outside resources, practitioners, and providers See CCM Referral  Care Plan: Printed and mailed to patient

## 2022-08-02 NOTE — Patient Instructions (Signed)
Please call the care guide team at [phone] if you need to cancel or reschedule your appointment.   If you are experiencing a Mental Health or [place] or need someone to talk to, please call the Suicide and Crisis Lifeline: 988 call the [place] National Suicide Prevention Lifeline: 2143000196 or TTY: (940) 455-3834 TTY 667 841 2191) to talk to a trained counselor call 1-800-273-TALK (toll free, 24 hour hotline)   Following is a copy of the CCM Program Consent:  CCM service includes personalized support from designated clinical staff supervised by the physician, including individualized plan of care and coordination with other care providers 24/7 contact phone numbers for assistance for urgent and routine care needs. Service will only be billed when office clinical staff spend 20 minutes or more in a month to coordinate care. Only one practitioner may furnish and bill the service in a calendar month. The patient may stop CCM services at amy time (effective at the end of the month) by phone call to the office staff. The patient will be responsible for cost sharing (co-pay) or up to 20% of the service fee (after annual deductible is met)  Following is a copy of your full provider care plan:   Goals Addressed             This Visit's Progress    CCM Expected Outcome:  Monitor, Self-Manage and Reduce Symptoms of: Memory changes       Current Barriers:  Chronic Disease Management support and education needs related to memory changes   Planned Interventions: Evaluation of current treatment plan related to dementia or memory changes and patient's adherence to plan as established by provider Advised patient to call the office for changes in memory, increased anxiety or acute changes in mental status Provided education to patient re: safety in the home, falls preventions, support system, and medication safety Reviewed medications with patient and discussed compliance. The  patient keeps her medications in their original bottles and she has them set up where she remembers to take them each morning. She denies any issues with making sure she takes her medications as directed. Review of the ability for pharmacy support if needed. The patient denies any needs at this time related to medication needs. Will continue to monitor.  Reviewed scheduled/upcoming provider appointments including 10-18-2022 at 15 am Discussed plans with patient for ongoing care management follow up and provided patient with direct contact information for care management team Advised patient to discuss changes in her memory, new questions or concerns about her health and well being with provider Screening for signs and symptoms of depression related to chronic disease state  Assessed social determinant of health barriers The patient denies any falls at this time, she uses a rollator walker, does have to pace her activity and rest at times, uses a wheelchair sometimes when she goes to different places. Did lose her food stamp card and went earlier today to get assistance with a replacement. She still drives and takes care of herself. She states her family is available if needed. Her son Kasandra Knudsen always comes on Sundays and they go out to eat or she fixes something for them to eat. She also has 2 sisters that she talks to on a regular basis. She has several friends at the apartment that she lives at. She feels she is doing well to be 87 years old. Review of the availability of the RNCM to help support her with her health and well being. The patient has the  RNCM number to call for needs in between outreaches.   Symptom Management: Take medications as prescribed   Attend all scheduled provider appointments Call provider office for new concerns or questions  call the Suicide and Crisis Lifeline: 988 call the [place] National Suicide Prevention Lifeline: 256-485-5789 or TTY: (320)231-1936 TTY 763-145-6252) to talk  to a trained counselor call 1-800-273-TALK (toll free, 24 hour hotline) if experiencing a Mental Health or [place]   Follow Up Plan: Telephone follow up appointment with care management team member scheduled for: 09-05-2022 at 230 pm       CCM Expected Outcome:  Monitor, Self-Manage, and Reduce Symptoms of Hypertension       Current Barriers:  Chronic Disease Management support and education needs related to effective management of HTN  Planned Interventions: Evaluation of current treatment plan related to hypertension self management and patient's adherence to plan as established by provider. Denies any issues with her blood pressures or heart health;   Provided education to patient re: stroke prevention, s/s of heart attack and stroke; Reviewed prescribed diet heart healthy diet Reviewed medications with patient and discussed importance of compliance;  Discussed plans with patient for ongoing care management follow up and provided patient with direct contact information for care management team; Advised patient, providing education and rationale, to monitor blood pressure daily and record, calling PCP for findings outside established parameters;  Reviewed scheduled/upcoming provider appointments including: 10-18-2022 at 1 am Advised patient to discuss changes in HTN or heart health with provider; Provided education on prescribed diet heart healthy diet;  Discussed complications of poorly controlled blood pressure such as heart disease, stroke, circulatory complications, vision complications, kidney impairment, sexual dysfunction;  Screening for signs and symptoms of depression related to chronic disease state;  Assessed social determinant of health barriers;   Symptom Management: Take medications as prescribed   Attend all scheduled provider appointments Call provider office for new concerns or questions  call the Suicide and Crisis Lifeline: 988 call the [place] National  Suicide Prevention Lifeline: 419-420-7366 or TTY: 860-061-6153 TTY 847-238-2325) to talk to a trained counselor call 1-800-273-TALK (toll free, 24 hour hotline) if experiencing a Mental Health or [place]  check blood pressure weekly learn about high blood pressure call doctor for signs and symptoms of high blood pressure develop an action plan for high blood pressure keep all doctor appointments take medications for blood pressure exactly as prescribed report new symptoms to your doctor  Follow Up Plan: Telephone follow up appointment with care management team member scheduled for: 09-05-2022 at 230 pm       CCM:  Maintain, Monitor and Self-Manage Symptoms of COPD       Current Barriers:  Chronic Disease Management support and education needs related to effective management of COPD  Planned Interventions: Provided patient with basic written and verbal COPD education on self care/management/and exacerbation prevention. The patient states she can tell when her allergies and sinuses are acting up. Denies any acute changes at this time. Advised patient to track and manage COPD triggers. Review of triggers that may exacerbate her conditions. She paces her activity and rest when she needs to do so. Provided written and verbal instructions on pursed lip breathing and utilized returned demonstration as teach back Provided instruction about proper use of medications used for management of COPD including inhalers Advised patient to self assesses COPD action plan zone and make appointment with provider if in the yellow zone for 48 hours without improvement Provided  education about and advised patient to utilize infection prevention strategies to reduce risk of respiratory infection Discussed the importance of adequate rest and management of fatigue with COPD Screening for signs and symptoms of depression related to chronic disease state  Assessed social determinant of health  barriers  Symptom Management: Take medications as prescribed   Attend all scheduled provider appointments Call provider office for new concerns or questions  call the Suicide and Crisis Lifeline: 988 call the [place] National Suicide Prevention Lifeline: 7180940793 or TTY: 727-683-0471 TTY 303-118-5966) to talk to a trained counselor call 1-800-273-TALK (toll free, 24 hour hotline) if experiencing a Mental Health or [place]  avoid second hand smoke eliminate smoking in my home identify and remove indoor air pollutants limit outdoor activity during cold weather listen for public air quality announcements every day do breathing exercises every day develop a rescue plan follow rescue plan if symptoms flare-up  Follow Up Plan: Telephone follow up appointment with care management team member scheduled for: 09-05-2022 at 230 pm          The patient verbalized understanding of instructions, educational materials, and care plan provided today and agreed to receive a mailed copy of patient instructions, educational materials, and care plan.   Telephone follow up appointment with care management team member scheduled for: 09-05-2022 at 230 pm

## 2022-08-03 ENCOUNTER — Ambulatory Visit
Admission: RE | Admit: 2022-08-03 | Discharge: 2022-08-03 | Disposition: A | Discharge status: Home / Self Care | Payer: 59 | Source: Ambulatory Visit | Attending: Nurse Practitioner | Admitting: Nurse Practitioner

## 2022-08-03 DIAGNOSIS — Z1231 Encounter for screening mammogram for malignant neoplasm of breast: Secondary | ICD-10-CM | POA: Insufficient documentation

## 2022-08-03 DIAGNOSIS — M85832 Other specified disorders of bone density and structure, left forearm: Secondary | ICD-10-CM | POA: Diagnosis present

## 2022-08-03 NOTE — Progress Notes (Signed)
Good day, please let Tracie Fisher know her bone density has returned and shows thinning of the bone (osteopenia), but not brittle bone (osteoporosis).  For this we recommend taking Vitamin D3 2000 units daily and ensuring good calcium intake via diet and multivitamin daily.  We will repeat scan in 5 years.  Have a good day:)

## 2022-08-04 ENCOUNTER — Observation Stay
Admission: EM | Admit: 2022-08-04 | Discharge: 2022-08-05 | Disposition: A | Discharge status: Home Health | Payer: 59 | Attending: Internal Medicine | Admitting: Internal Medicine

## 2022-08-04 ENCOUNTER — Emergency Department: Payer: 59

## 2022-08-04 ENCOUNTER — Inpatient Hospital Stay: Payer: 59

## 2022-08-04 ENCOUNTER — Other Ambulatory Visit: Payer: Self-pay

## 2022-08-04 DIAGNOSIS — R41841 Cognitive communication deficit: Secondary | ICD-10-CM | POA: Diagnosis not present

## 2022-08-04 DIAGNOSIS — Z87891 Personal history of nicotine dependence: Secondary | ICD-10-CM | POA: Diagnosis not present

## 2022-08-04 DIAGNOSIS — H349 Unspecified retinal vascular occlusion: Secondary | ICD-10-CM

## 2022-08-04 DIAGNOSIS — H547 Unspecified visual loss: Secondary | ICD-10-CM | POA: Diagnosis not present

## 2022-08-04 DIAGNOSIS — I639 Cerebral infarction, unspecified: Secondary | ICD-10-CM | POA: Diagnosis not present

## 2022-08-04 DIAGNOSIS — Z7902 Long term (current) use of antithrombotics/antiplatelets: Secondary | ICD-10-CM | POA: Insufficient documentation

## 2022-08-04 DIAGNOSIS — I129 Hypertensive chronic kidney disease with stage 1 through stage 4 chronic kidney disease, or unspecified chronic kidney disease: Secondary | ICD-10-CM | POA: Diagnosis not present

## 2022-08-04 DIAGNOSIS — G9389 Other specified disorders of brain: Secondary | ICD-10-CM | POA: Diagnosis not present

## 2022-08-04 DIAGNOSIS — Z8673 Personal history of transient ischemic attack (TIA), and cerebral infarction without residual deficits: Secondary | ICD-10-CM | POA: Diagnosis not present

## 2022-08-04 DIAGNOSIS — R2689 Other abnormalities of gait and mobility: Secondary | ICD-10-CM | POA: Insufficient documentation

## 2022-08-04 DIAGNOSIS — D500 Iron deficiency anemia secondary to blood loss (chronic): Secondary | ICD-10-CM | POA: Insufficient documentation

## 2022-08-04 DIAGNOSIS — M6281 Muscle weakness (generalized): Secondary | ICD-10-CM | POA: Diagnosis not present

## 2022-08-04 DIAGNOSIS — I672 Cerebral atherosclerosis: Secondary | ICD-10-CM | POA: Insufficient documentation

## 2022-08-04 DIAGNOSIS — E039 Hypothyroidism, unspecified: Secondary | ICD-10-CM | POA: Insufficient documentation

## 2022-08-04 DIAGNOSIS — H5461 Unqualified visual loss, right eye, normal vision left eye: Secondary | ICD-10-CM | POA: Diagnosis present

## 2022-08-04 DIAGNOSIS — N1832 Chronic kidney disease, stage 3b: Secondary | ICD-10-CM | POA: Diagnosis not present

## 2022-08-04 DIAGNOSIS — R519 Headache, unspecified: Secondary | ICD-10-CM | POA: Diagnosis present

## 2022-08-04 DIAGNOSIS — R262 Difficulty in walking, not elsewhere classified: Secondary | ICD-10-CM | POA: Insufficient documentation

## 2022-08-04 DIAGNOSIS — E785 Hyperlipidemia, unspecified: Secondary | ICD-10-CM | POA: Diagnosis not present

## 2022-08-04 DIAGNOSIS — Z79899 Other long term (current) drug therapy: Secondary | ICD-10-CM | POA: Insufficient documentation

## 2022-08-04 DIAGNOSIS — I6782 Cerebral ischemia: Secondary | ICD-10-CM | POA: Insufficient documentation

## 2022-08-04 DIAGNOSIS — N184 Chronic kidney disease, stage 4 (severe): Secondary | ICD-10-CM | POA: Diagnosis present

## 2022-08-04 DIAGNOSIS — Z85828 Personal history of other malignant neoplasm of skin: Secondary | ICD-10-CM | POA: Diagnosis not present

## 2022-08-04 DIAGNOSIS — F418 Other specified anxiety disorders: Secondary | ICD-10-CM | POA: Diagnosis present

## 2022-08-04 DIAGNOSIS — I10 Essential (primary) hypertension: Secondary | ICD-10-CM

## 2022-08-04 DIAGNOSIS — E663 Overweight: Secondary | ICD-10-CM | POA: Insufficient documentation

## 2022-08-04 DIAGNOSIS — D509 Iron deficiency anemia, unspecified: Secondary | ICD-10-CM | POA: Diagnosis present

## 2022-08-04 LAB — COMPREHENSIVE METABOLIC PANEL
ALT: 14 U/L (ref 0–44)
AST: 25 U/L (ref 15–41)
Albumin: 4 g/dL (ref 3.5–5.0)
Alkaline Phosphatase: 67 U/L (ref 38–126)
Anion gap: 10 (ref 5–15)
BUN: 51 mg/dL — ABNORMAL HIGH (ref 8–23)
CO2: 25 mmol/L (ref 22–32)
Calcium: 9.3 mg/dL (ref 8.9–10.3)
Chloride: 102 mmol/L (ref 98–111)
Creatinine, Ser: 1.7 mg/dL — ABNORMAL HIGH (ref 0.44–1.00)
GFR, Estimated: 29 mL/min — ABNORMAL LOW (ref >60)
Glucose, Bld: 73 mg/dL (ref 70–99)
Potassium: 4.5 mmol/L (ref 3.5–5.1)
Sodium: 137 mmol/L (ref 135–145)
Total Bilirubin: 0.6 mg/dL (ref 0.3–1.2)
Total Protein: 7.3 g/dL (ref 6.5–8.1)

## 2022-08-04 LAB — URINE DRUG SCREEN, QUALITATIVE (ARMC ONLY)
Amphetamines, Ur Screen: NOT DETECTED
Barbiturates, Ur Screen: NOT DETECTED
Benzodiazepine, Ur Scrn: POSITIVE — AB
Cannabinoid 50 Ng, Ur ~~LOC~~: NOT DETECTED
Cocaine Metabolite,Ur ~~LOC~~: NOT DETECTED
MDMA (Ecstasy)Ur Screen: NOT DETECTED
Methadone Scn, Ur: NOT DETECTED
Opiate, Ur Screen: NOT DETECTED
Phencyclidine (PCP) Ur S: NOT DETECTED
Tricyclic, Ur Screen: NOT DETECTED

## 2022-08-04 LAB — DIFFERENTIAL
Abs Immature Granulocytes: 0.03 10*3/uL (ref 0.00–0.07)
Basophils Absolute: 0.1 10*3/uL (ref 0.0–0.1)
Basophils Relative: 1 %
Eosinophils Absolute: 0.3 10*3/uL (ref 0.0–0.5)
Eosinophils Relative: 4 %
Immature Granulocytes: 0 %
Lymphocytes Relative: 23 %
Lymphs Abs: 1.6 10*3/uL (ref 0.7–4.0)
Monocytes Absolute: 0.9 10*3/uL (ref 0.1–1.0)
Monocytes Relative: 13 %
Neutro Abs: 4.1 10*3/uL (ref 1.7–7.7)
Neutrophils Relative %: 59 %

## 2022-08-04 LAB — APTT: aPTT: 29 seconds (ref 24–36)

## 2022-08-04 LAB — SEDIMENTATION RATE: Sed Rate: 29 mm/hr (ref 0–30)

## 2022-08-04 LAB — PROTIME-INR
INR: 1 (ref 0.8–1.2)
Prothrombin Time: 13.1 seconds (ref 11.4–15.2)

## 2022-08-04 LAB — URINALYSIS, ROUTINE W REFLEX MICROSCOPIC
Bilirubin Urine: NEGATIVE
Glucose, UA: NEGATIVE mg/dL
Hgb urine dipstick: NEGATIVE
Ketones, ur: NEGATIVE mg/dL
Leukocytes,Ua: NEGATIVE
Nitrite: NEGATIVE
Protein, ur: NEGATIVE mg/dL
Specific Gravity, Urine: <1.005 — ABNORMAL LOW (ref 1.005–1.030)
pH: 5.5 (ref 5.0–8.0)

## 2022-08-04 LAB — CBG MONITORING, ED: Glucose-Capillary: 63 mg/dL — ABNORMAL LOW (ref 70–99)

## 2022-08-04 LAB — CBC
HCT: 37.6 % (ref 36.0–46.0)
Hemoglobin: 12 g/dL (ref 12.0–15.0)
MCH: 32.2 pg (ref 26.0–34.0)
MCHC: 31.9 g/dL (ref 30.0–36.0)
MCV: 100.8 fL — ABNORMAL HIGH (ref 80.0–100.0)
Platelets: 300 10*3/uL (ref 150–400)
RBC: 3.73 MIL/uL — ABNORMAL LOW (ref 3.87–5.11)
RDW: 13.7 % (ref 11.5–15.5)
WBC: 7 10*3/uL (ref 4.0–10.5)
nRBC: 0 % (ref 0.0–0.2)

## 2022-08-04 LAB — ETHANOL: Alcohol, Ethyl (B): <10 mg/dL (ref <10)

## 2022-08-04 LAB — HEMOGLOBIN A1C
Hgb A1c MFr Bld: 5.6 % (ref 4.8–5.6)
Mean Plasma Glucose: 114 mg/dL

## 2022-08-04 MED ORDER — CLOPIDOGREL BISULFATE 75 MG PO TABS
75.0000 mg | ORAL_TABLET | Freq: Every day | ORAL | Status: DC
  Administered 2022-08-05: 75 mg via ORAL
  Filled 2022-08-04: qty 1

## 2022-08-04 MED ORDER — CYANOCOBALAMIN 500 MCG PO TABS
500.0000 ug | ORAL_TABLET | Freq: Every day | ORAL | Status: DC
  Administered 2022-08-05: 500 ug via ORAL
  Filled 2022-08-04: qty 1

## 2022-08-04 MED ORDER — ACETAMINOPHEN 650 MG RE SUPP: 650.0000 mg | RECTAL | Status: DC | PRN

## 2022-08-04 MED ORDER — ACETAMINOPHEN 325 MG PO TABS
650.0000 mg | ORAL_TABLET | ORAL | Status: DC | PRN
  Administered 2022-08-05: 650 mg via ORAL
  Filled 2022-08-04: qty 2

## 2022-08-04 MED ORDER — ENOXAPARIN SODIUM 30 MG/0.3ML IJ SOSY
30.0000 mg | PREFILLED_SYRINGE | INTRAMUSCULAR | Status: DC
  Administered 2022-08-04: 30 mg via SUBCUTANEOUS
  Filled 2022-08-04: qty 0.3

## 2022-08-04 MED ORDER — HYDRALAZINE HCL 20 MG/ML IJ SOLN: 5.0000 mg | INTRAMUSCULAR | Status: DC | PRN

## 2022-08-04 MED ORDER — STROKE: EARLY STAGES OF RECOVERY BOOK: Freq: Once | Status: AC

## 2022-08-04 MED ORDER — PREGABALIN 75 MG PO CAPS
75.0000 mg | ORAL_CAPSULE | Freq: Three times a day (TID) | ORAL | Status: DC
  Administered 2022-08-04 – 2022-08-05 (×2): 75 mg via ORAL
  Filled 2022-08-04 (×3): qty 1

## 2022-08-04 MED ORDER — LEVOTHYROXINE SODIUM 50 MCG PO TABS
50.0000 ug | ORAL_TABLET | Freq: Every day | ORAL | Status: DC
  Administered 2022-08-05: 50 ug via ORAL
  Filled 2022-08-04: qty 1

## 2022-08-04 MED ORDER — PRAVASTATIN SODIUM 20 MG PO TABS
20.0000 mg | ORAL_TABLET | Freq: Every day | ORAL | Status: DC
  Administered 2022-08-05: 20 mg via ORAL
  Filled 2022-08-04: qty 1

## 2022-08-04 MED ORDER — ALPRAZOLAM 0.5 MG PO TABS
0.5000 mg | ORAL_TABLET | Freq: Two times a day (BID) | ORAL | Status: DC
  Administered 2022-08-04 (×2): 0.5 mg via ORAL
  Administered 2022-08-05: 1 mg via ORAL
  Filled 2022-08-04: qty 1
  Filled 2022-08-04: qty 2
  Filled 2022-08-04: qty 1

## 2022-08-04 MED ORDER — FERROUS GLUCONATE 324 (38 FE) MG PO TABS
324.0000 mg | ORAL_TABLET | Freq: Two times a day (BID) | ORAL | Status: DC
  Administered 2022-08-04 – 2022-08-05 (×2): 324 mg via ORAL
  Filled 2022-08-04 (×3): qty 1

## 2022-08-04 MED ORDER — DEXTROSE 50 % IV SOLN: 50.0000 mL | INTRAVENOUS | Status: DC | PRN

## 2022-08-04 MED ORDER — SENNOSIDES-DOCUSATE SODIUM 8.6-50 MG PO TABS: 1.0000 | ORAL_TABLET | Freq: Every evening | ORAL | Status: DC | PRN

## 2022-08-04 MED ORDER — ONDANSETRON HCL 4 MG/2ML IJ SOLN: 4.0000 mg | Freq: Three times a day (TID) | INTRAMUSCULAR | Status: DC | PRN

## 2022-08-04 MED ORDER — ACETAMINOPHEN 160 MG/5ML PO SOLN: 650.0000 mg | ORAL | Status: DC | PRN

## 2022-08-04 NOTE — Evaluation (Signed)
Physical Therapy Evaluation Patient Details Name: Tracie Fisher MRN: [id number] DOB: 05/20/1935 Today's Date: 08/04/2022  History of Present Illness  Tracie Fisher is a 87 y.o. female with medical history significant of hypertension, hyperlipidemia, stroke, hypothyroidism, depression with anxiety, CKD-3B, anemia, varicose vein, chronic right-sided headache, who presents with right vision loss.  Patient has direct vision loss for 2 days.  Seen by ophthalmologist and sent to ER for possible stroke.  MRI for brain is positive for stroke.    Clinical Impression  Pt received in Semi-Fowler's position and agreeable to therapy.  Pt states she has been up a couple of times with the nursing staff to go to the restroom and no use of AD, but feels more comfortable with a walker.  Pt states she typically utilizes the walker at home and when going out.  Pt has decreased strength in the R UE and the B LE's since coming to OPPT/OPOT.  Pt still able to ambulate well with the walker however, and would benefit from continued therapy during hospital stay and home health as necessary before being referred back to the outpatient therapies.  Pt left in bed with all needs met and calbell within reach.  .         Recommendations for follow up therapy are one component of a multi-disciplinary discharge planning process, led by the attending physician.  Recommendations may be updated based on patient status, additional functional criteria and insurance authorization.  Follow Up Recommendations Home health PT      Assistance Recommended at Discharge PRN  Patient can return home with the following  A little help with walking and/or transfers;A little help with bathing/dressing/bathroom;Assist for transportation;Help with stairs or ramp for entrance    Equipment Recommendations None recommended by PT  Recommendations for Other Services       Functional Status Assessment Patient has had a recent decline in their  functional status and demonstrates the ability to make significant improvements in function in a reasonable and predictable amount of time.     Precautions / Restrictions Restrictions Weight Bearing Restrictions: No      Mobility  Bed Mobility Overal bed mobility: Modified Independent             General bed mobility comments: Pt has limited difficulty getting back into bed, likely due to the height of the ED bed.    Transfers Overall transfer level: Modified independent Equipment used: Rolling walker (2 wheels)               General transfer comment: Pt able to perform transfer with use of the RW.    Ambulation/Gait Ambulation/Gait assistance: Supervision Gait Distance (Feet): 200 Feet Assistive device: Rolling walker (2 wheels) Gait Pattern/deviations: Step-through pattern Gait velocity: decreased     General Gait Details: Pt ambulates well, could likely ambulate without the AD due to already doing that with the nurse to go to the commode, but she feels more safe with the walker.  Stairs            Wheelchair Mobility    Modified Rankin (Stroke Patients Only)       Balance Overall balance assessment: No apparent balance deficits (not formally assessed)                                           Pertinent Vitals/Pain Pain Assessment Pain Assessment: No/denies  pain    Home Living Family/patient expects to be discharged to:: Private residence Living Arrangements: Alone Available Help at Discharge: Family;Available PRN/intermittently Type of Home: Apartment Home Access: Level entry       Home Layout: One level Home Equipment: Rollator (4 wheels);Grab bars - tub/shower      Prior Function Prior Level of Function : Independent/Modified Independent                     Hand Dominance   Dominant Hand: Right    Extremity/Trunk Assessment   Upper Extremity Assessment Upper Extremity Assessment: Generalized  weakness    Lower Extremity Assessment Lower Extremity Assessment: Generalized weakness       Communication   Communication: No difficulties  Cognition Arousal/Alertness: Awake/alert Behavior During Therapy: WFL for tasks assessed/performed Overall Cognitive Status: Within Functional Limits for tasks assessed                                          General Comments      Exercises Other Exercises Other Exercises: Pt educated on roles of therapy and services provided during hospital stay.  Pt also educated on importance of physical acivity in order to prevent muscle wasting.   Assessment/Plan    PT Assessment Patient needs continued PT services  PT Problem List Decreased strength;Decreased activity tolerance;Decreased balance;Decreased mobility;Decreased safety awareness       PT Treatment Interventions DME instruction    PT Goals (Current goals can be found in the Care Plan section)  Acute Rehab PT Goals Patient Stated Goal: to get stronger and get back to PT/OT in outpatient setting. PT Goal Formulation: With patient/family Time For Goal Achievement: 08/18/22 Potential to Achieve Goals: Good    Frequency Min 2X/week     Co-evaluation               AM-PAC PT "6 Clicks" Mobility  Outcome Measure Help needed turning from your back to your side while in a flat bed without using bedrails?: A Little Help needed moving from lying on your back to sitting on the side of a flat bed without using bedrails?: A Little Help needed moving to and from a bed to a chair (including a wheelchair)?: A Little Help needed standing up from a chair using your arms (e.g., wheelchair or bedside chair)?: A Little Help needed to walk in hospital room?: A Little Help needed climbing 3-5 steps with a railing? : A Little 6 Click Score: 18    End of Session Equipment Utilized During Treatment: Gait belt Activity Tolerance: Patient tolerated treatment well Patient  left: in bed;with call bell/phone within reach;with family/visitor present Nurse Communication: Mobility status PT Visit Diagnosis: Unsteadiness on feet (R26.81);Other abnormalities of gait and mobility (R26.89);Muscle weakness (generalized) (M62.81);Difficulty in walking, not elsewhere classified (R26.2)    Time: [id number] PT Time Calculation (min) (ACUTE ONLY): 34 min   Charges:   PT Evaluation $PT Eval Low Complexity: 1 Low PT Treatments $Gait Training: 8-22 mins        Gwenlyn Saran, PT, DPT Physical Therapist - [facility]  08/04/22, 3:45 PM

## 2022-08-04 NOTE — ED Notes (Signed)
Opthalmology called in reference to pt needing a stroke workup. 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

## 2022-08-04 NOTE — ED Triage Notes (Signed)
Pt. To ED via POV, sent by ophthalmologist for possible occlusion of retinal artery. Pt. Experiencing black x2 days, that migrates around eye. Dr. Ellender Hose notified.

## 2022-08-04 NOTE — H&P (Signed)
History and Physical    Tracie Fisher [id number] DOB: 1936/02/06 DOA: 08/04/2022  Referring MD/NP/PA:   PCP: Venita Lick, NP   Patient coming from:  The patient is coming from home.   Chief Complaint: right vision loss  HPI: Tracie Fisher is a 87 y.o. female with medical history significant of hypertension, hyperlipidemia, stroke, hypothyroidism, depression with anxiety, CKD-3B, anemia, varicose vein, chronic right-sided headache, who presents with right vision loss.   Patient reports that for the past 2 days she has noticed a black spot in her vision in the right eye.  She was seen by ophthalmologist earlier today and was sent to ED due to concerning of retinal artery occlusion.  Patient does not have unilateral numbness or weakness in extremities.  She states that she has chronic bilateral leg weakness, which has not changed.  No facial droop or slurred speech.  Patient does not have chest pain, cough, short of breath.  No fever or chills.  No nausea, vomiting, diarrhea or abdominal pain.  No symptoms of UTI.  Patient states that she has chronic right sided headache.  Data reviewed independently and ED Course: pt was found to have WBC 7.0, negative urinalysis, renal function close to baseline, temperature normal, blood pressure 130/78, heart rate 90, RR 18, oxygen saturation 99% on room air, temperature normal.  Patient is admitted to telemetry bed as inpatient. Dr. Cheral Marker of neurology is consulted.  MRI-brain and orbit 1. Acute infarcts in the right aspect of the body of the corpus callosum, right frontal cortex, right parietal cortex, and right occipital cortex and white matter. No associated hemorrhage or mass effect. Given multiple vascular territories, a central embolic etiology is suspected. 2. No acute orbital process.  EKG: I have personally reviewed.  Sinus rhythm, QTc 380, early R wave progression, low voltage, Q-wave only in lead III   Review of Systems:    General: no fevers, chills, no body weight gain, fatigue HEENT: no hearing changes or sore throat. Has right sided HA Respiratory: no dyspnea, coughing, wheezing CV: no chest pain, no palpitations GI: no nausea, vomiting, abdominal pain, diarrhea, constipation GU: no dysuria, burning on urination, increased urinary frequency, hematuria  Ext: has trace leg edema Neuro: no unilateral weakness, numbness, or tingling, no hearing loss. Has vision loss in right eye Skin: no rash, no skin tear. MSK: No muscle spasm, no deformity, no limitation of range of movement in spin Heme: No easy bruising.  Travel history: No recent long distant travel.   Allergy:  Allergies  Allergen Reactions   Aspirin Itching   Penicillins Other (See Comments)    Unknown reaction Has patient had a PCN reaction causing immediate rash, facial/tongue/throat swelling, SOB or lightheadedness with hypotension: Unknown Has patient had a PCN reaction causing severe rash involving mucus membranes or skin necrosis: Unknown Has patient had a PCN reaction that required hospitalization: Unknown Has patient had a PCN reaction occurring within the last 10 years: No If all of the above answers are "NO", then may proceed with Cephalosporin use. Patient does not remember the reaction to penicillin.   Penicillin G     Unknown reaction Has patient had a PCN reaction causing immediate rash, facial/tongue/throat swelling, SOB or lightheadedness with hypotension: Unknown Has patient had a PCN reaction causing severe rash involving mucus membranes or skin necrosis: Unknown Has patient had a PCN reaction that required hospitalization: Unknown Has patient had a PCN reaction occurring within the last 10 years: No If all  of the above answers are "NO", then may proceed with Cephalosporin use.     Past Medical History:  Diagnosis Date   Cancer ([place])    SKIN   Complication of anesthesia    Depression    Dysrhythmia    GERD  (gastroesophageal reflux disease)    Hiatal hernia    HOH (hard of hearing)    Hypertension    Hypothyroidism    PONV (postoperative nausea and vomiting)    Thyroid disease     Past Surgical History:  Procedure Laterality Date   APPENDECTOMY     BACK SURGERY     MULTIPLE/ROD IN BACK   BREAST CYST ASPIRATION Left "years ago"   CATARACT EXTRACTION W/PHACO Left 03/21/2017   Procedure: CATARACT EXTRACTION PHACO AND INTRAOCULAR LENS PLACEMENT ([place]);  Surgeon: Birder Robson, MD;  Location: [facility] ORS;  Service: Ophthalmology;  Laterality: Left;  [place] 00:48 AP% 17.8 CDE 8.57 Fluid pack lot # [id number]   CATARACT EXTRACTION W/PHACO Right 04/11/2017   Procedure: CATARACT EXTRACTION PHACO AND INTRAOCULAR LENS PLACEMENT ([facility]);  Surgeon: Birder Robson, MD;  Location: [facility] ORS;  Service: Ophthalmology;  Laterality: Right;  [place] 01:09.5 AP% 15.8 CDE 11.04 Fluid Pack lot # [id number]   CHOLECYSTECTOMY     COLON SURGERY     BOWEL OBSTRUCTION   COLONOSCOPY WITH PROPOFOL N/A 04/25/2016   Procedure: COLONOSCOPY WITH PROPOFOL;  Surgeon: Jonathon Bellows, MD;  Location: [facility] ENDOSCOPY;  Service: Endoscopy;  Laterality: N/A;   ESOPHAGOGASTRODUODENOSCOPY (EGD) WITH PROPOFOL N/A 04/25/2016   Procedure: ESOPHAGOGASTRODUODENOSCOPY (EGD) WITH PROPOFOL;  Surgeon: Jonathon Bellows, MD;  Location: [facility] ENDOSCOPY;  Service: Endoscopy;  Laterality: N/A;   EXPLORATORY LAPAROTOMY  10/18/2011   blockage small intestines   GIVENS CAPSULE STUDY N/A 05/11/2016   Procedure: GIVENS CAPSULE STUDY;  Surgeon: Jonathon Bellows, MD;  Location: [facility] ENDOSCOPY;  Service: Endoscopy;  Laterality: N/A;    Social History:  reports that she has quit smoking. Her smoking use included cigarettes. She has never used smokeless tobacco. She reports current alcohol use. She reports that she does not use drugs.  Family History:  Family History  Problem Relation Age of Onset   Breast cancer Sister 5   Heart attack Brother    Heart disease Father       Prior to Admission medications   Medication Sig Start Date End Date Taking? Authorizing Provider  ALPRAZolam Duanne Moron) 1 MG tablet Take 0.5 mg (1/2 tablet) by mouth in the morning and 1 mg (1 tablet) by mouth at bedtime. 08/09/22   Marnee Guarneri T, NP  clopidogrel (PLAVIX) 75 MG tablet Take by mouth. 11/21/18   [provider]  cyanocobalamin (V-R VITAMIN B-12) 500 MCG tablet Take 500 mcg by mouth daily.     [provider]  ferrous gluconate (FERGON) 240 (27 FE) MG tablet Take by mouth.    [provider]  furosemide (LASIX) 20 MG tablet Take 20 mg by mouth 2 (two) times daily.    [provider]  levothyroxine (SYNTHROID) 50 MCG tablet Take 1 tablet (50 mcg total) by mouth daily before breakfast. 07/11/22   Cannady, Henrine Screws T, NP  losartan (COZAAR) 50 MG tablet Take 50 mg daily by mouth.    [provider]  metoprolol tartrate (LOPRESSOR) 25 MG tablet Take 25 mg by mouth 2 (two) times daily.    [provider]  Multiple Vitamin (MULTIVITAMIN WITH MINERALS) TABS tablet Take 1 tablet by mouth daily.    [provider]  omeprazole (  PRILOSEC) 40 MG capsule Take 40 mg by mouth. 06/03/16   [provider]  pravastatin (PRAVACHOL) 20 MG tablet Take by mouth. 11/21/18   [provider]  pregabalin (LYRICA) 75 MG capsule Take 75 mg by mouth 3 (three) times daily.    [provider]  Probiotic Product (PROBIOTIC DAILY PO) Take by mouth daily.    [provider]  psyllium (METAMUCIL) 58.6 % powder Take 1 packet by mouth 3 (three) times daily.     [provider]  spironolactone (ALDACTONE) 25 MG tablet Take 1 tablet by mouth daily. 10/21/19   [provider]    Physical Exam: Vitals:   08/04/22 1015 08/04/22 1305 08/04/22 1619 08/04/22 1751  BP: 130/78 119/65 (!) 120/58 139/85  Pulse: 90 60 (!) 58 62  Resp: 18 19 16    Temp: 98 F (36.7 C)  97.9 F (36.6 C) 97.6 F (36.4 C)  TempSrc: Oral      SpO2: 99% 98% 96%   Weight:      Height:       General: Not in acute distress HEENT:       Eyes: PERRL, EOMI, no scleral icterus.       ENT: No discharge from the ears and nose, no pharynx injection, no tonsillar enlargement.        Neck: No JVD, no bruit, no mass felt. Heme: No neck lymph node enlargement. Cardiac: S1/S2, RRR, No murmurs, No gallops or rubs. Respiratory: No rales, wheezing, rhonchi or rubs. GI: Soft, nondistended, nontender, no rebound pain, no organomegaly, BS present. GU: No hematuria Ext: has trace leg edema bilaterally. 1+DP/PT pulse bilaterally. Musculoskeletal: No joint deformities, No joint redness or warmth, no limitation of ROM in spin. Skin: No rashes.  Neuro: Alert, oriented X3, cranial nerves II-XII grossly intact except for right eye vision loss, moves all extremities normally. Muscle strength 5/5 in all extremities, sensation to light touch intact. Brachial reflex 2+ bilaterally.  Psych: Patient is not psychotic, no suicidal or hemocidal ideation.  Labs on Admission: I have personally reviewed following labs and imaging studies  CBC: Recent Labs  Lab 08/04/22 1009  WBC 7.0  NEUTROABS 4.1  HGB 12.0  HCT 37.6  MCV 100.8*  PLT [id number]   Basic Metabolic Panel: Recent Labs  Lab 08/04/22 1009  NA 137  K 4.5  CL 102  CO2 25  GLUCOSE 73  BUN 51*  CREATININE 1.70*  CALCIUM 9.3   GFR: Estimated Creatinine Clearance: 21.5 mL/min (A) (by C-G formula based on SCr of 1.7 mg/dL (H)). Liver Function Tests: Recent Labs  Lab 08/04/22 1009  AST 25  ALT 14  ALKPHOS 67  BILITOT 0.6  PROT 7.3  ALBUMIN 4.0   No results for input(s): "LIPASE", "AMYLASE" in the last 168 hours. No results for input(s): "AMMONIA" in the last 168 hours. Coagulation Profile: Recent Labs  Lab 08/04/22 1009  INR 1.0   Cardiac Enzymes: No results for input(s): "CKTOTAL", "CKMB", "CKMBINDEX", "TROPONINI" in the last 168 hours. BNP (last 3 results) No results for  input(s): "PROBNP" in the last 8760 hours. HbA1C: No results for input(s): "HGBA1C" in the last 72 hours. CBG: Recent Labs  Lab 08/04/22 1009  GLUCAP 63*   Lipid Profile: No results for input(s): "CHOL", "HDL", "LDLCALC", "TRIG", "CHOLHDL", "LDLDIRECT" in the last 72 hours. Thyroid Function Tests: No results for input(s): "TSH", "T4TOTAL", "FREET4", "T3FREE", "THYROIDAB" in the last 72 hours. Anemia Panel: No results for input(s): "VITAMINB12", "FOLATE", "FERRITIN", "TIBC", "  IRON", "RETICCTPCT" in the last 72 hours. Urine analysis:    Component Value Date/Time   COLORURINE YELLOW 08/04/2022 1009   APPEARANCEUR CLEAR 08/04/2022 1009   APPEARANCEUR Clear 02/13/2013 2241   LABSPEC <1.005 (L) 08/04/2022 1009   LABSPEC 1.016 02/13/2013 2241   PHURINE 5.5 08/04/2022 1009   GLUCOSEU NEGATIVE 08/04/2022 1009   GLUCOSEU Negative 02/13/2013 2241   HGBUR NEGATIVE 08/04/2022 1009   BILIRUBINUR NEGATIVE 08/04/2022 1009   BILIRUBINUR Negative 02/13/2013 2241   KETONESUR NEGATIVE 08/04/2022 1009   PROTEINUR NEGATIVE 08/04/2022 1009   NITRITE NEGATIVE 08/04/2022 1009   LEUKOCYTESUR NEGATIVE 08/04/2022 1009   LEUKOCYTESUR Negative 02/13/2013 2241   Sepsis Labs: @LABRCNTIP (procalcitonin:4,lacticidven:4) )No results found for this or any previous visit (from the past 240 hour(s)).   Radiological Exams on Admission: MM 3D SCREEN BREAST BILATERAL  Result Date: 08/04/2022 CLINICAL DATA:  Screening. EXAM: DIGITAL SCREENING BILATERAL MAMMOGRAM WITH TOMOSYNTHESIS AND CAD TECHNIQUE: Bilateral screening digital craniocaudal and mediolateral oblique mammograms were obtained. Bilateral screening digital breast tomosynthesis was performed. The images were evaluated with computer-aided detection. COMPARISON:  Previous exam(s). ACR Breast Density Category b: There are scattered areas of fibroglandular density. FINDINGS: There are no findings suspicious for malignancy. IMPRESSION: No mammographic evidence  of malignancy. A result letter of this screening mammogram will be mailed directly to the patient. RECOMMENDATION: Screening mammogram in one year. (Code:SM-B-01Y) BI-RADS CATEGORY  1: Negative. Electronically Signed   By: Ammie Ferrier M.D.   On: 08/04/2022 14:32   MR BRAIN WO CONTRAST  Result Date: 08/04/2022 CLINICAL DATA:  Possible occlusion of the retinal artery, visual symptoms, stroke suspected EXAM: MRI HEAD AND ORBITS WITHOUT CONTRAST TECHNIQUE: Multiplanar, multi-echo pulse sequences of the brain and surrounding structures were acquired without intravenous contrast. Multiplanar, multi-echo pulse sequences of the orbits and surrounding structures were acquired including fat saturation techniques, without intravenous contrast administration. COMPARISON:  03/17/2020 MRI head FINDINGS: MRI HEAD FINDINGS Brain: Restricted diffusion with ADC correlate in the right aspect of the body of the corpus callosum (series 5, image 36), with additional smaller foci of restricted diffusion with ADC correlates in the right frontal cortex (series 5, image 41), right parietal cortex (series 5, image 40), and right occipital cortex and white matter (series 5, images 26-27). These areas are associated with increased T2 hyperintense signal. No acute hemorrhage, mass, mass effect, or midline shift. No hydrocephalus or extra-axial collection. Normal craniocervical junction. Normal cerebral volume for age. Left frontal lobe encephalomalacia from remote infarct. Remote lacunar infarcts in the right internal capsule and left thalamus. Scattered T2 hyperintense signal in the periventricular white matter, likely the sequela of mild to moderate chronic small vessel ischemic disease. Vascular: Normal arterial flow voids. Skull and upper cervical spine: Normal marrow signal. Other: Trace fluid in left mastoid air cells. MRI ORBITS FINDINGS Orbits: No orbital mass or evidence of inflammation. Status post bilateral lens  replacements. Otherwise normal appearance of the globes, optic nerve-sheath complexes, extraocular muscles, orbital fat and lacrimal glands. Visualized sinuses: Clear paranasal sinuses. Soft tissues: Negative. IMPRESSION: 1. Acute infarcts in the right aspect of the body of the corpus callosum, right frontal cortex, right parietal cortex, and right occipital cortex and white matter. No associated hemorrhage or mass effect. Given multiple vascular territories, a central embolic etiology is suspected. 2. No acute orbital process. These results were called by telephone at the time of interpretation on 08/04/2022 at 12:55 pm to provider JESSUP , who verbally acknowledged these results. Electronically Signed   By: Bryson Ha  Vasan M.D.   On: 08/04/2022 12:57   MR ORBITS WO CONTRAST  Result Date: 08/04/2022 CLINICAL DATA:  Possible occlusion of the retinal artery, visual symptoms, stroke suspected EXAM: MRI HEAD AND ORBITS WITHOUT CONTRAST TECHNIQUE: Multiplanar, multi-echo pulse sequences of the brain and surrounding structures were acquired without intravenous contrast. Multiplanar, multi-echo pulse sequences of the orbits and surrounding structures were acquired including fat saturation techniques, without intravenous contrast administration. COMPARISON:  03/17/2020 MRI head FINDINGS: MRI HEAD FINDINGS Brain: Restricted diffusion with ADC correlate in the right aspect of the body of the corpus callosum (series 5, image 36), with additional smaller foci of restricted diffusion with ADC correlates in the right frontal cortex (series 5, image 41), right parietal cortex (series 5, image 40), and right occipital cortex and white matter (series 5, images 26-27). These areas are associated with increased T2 hyperintense signal. No acute hemorrhage, mass, mass effect, or midline shift. No hydrocephalus or extra-axial collection. Normal craniocervical junction. Normal cerebral volume for age. Left frontal lobe encephalomalacia  from remote infarct. Remote lacunar infarcts in the right internal capsule and left thalamus. Scattered T2 hyperintense signal in the periventricular white matter, likely the sequela of mild to moderate chronic small vessel ischemic disease. Vascular: Normal arterial flow voids. Skull and upper cervical spine: Normal marrow signal. Other: Trace fluid in left mastoid air cells. MRI ORBITS FINDINGS Orbits: No orbital mass or evidence of inflammation. Status post bilateral lens replacements. Otherwise normal appearance of the globes, optic nerve-sheath complexes, extraocular muscles, orbital fat and lacrimal glands. Visualized sinuses: Clear paranasal sinuses. Soft tissues: Negative. IMPRESSION: 1. Acute infarcts in the right aspect of the body of the corpus callosum, right frontal cortex, right parietal cortex, and right occipital cortex and white matter. No associated hemorrhage or mass effect. Given multiple vascular territories, a central embolic etiology is suspected. 2. No acute orbital process. These results were called by telephone at the time of interpretation on 08/04/2022 at 12:55 pm to provider JESSUP , who verbally acknowledged these results. Electronically Signed   By: Merilyn Baba M.D.   On: 08/04/2022 12:57   DG Bone Density  Result Date: 08/03/2022 EXAM: DUAL X-RAY ABSORPTIOMETRY (DXA) FOR BONE MINERAL DENSITY IMPRESSION: Dear Dr Ned Card, Your patient KENZLEI RUNIONS completed a FRAX assessment on 08/03/2022 using the [place] (analysis version: 14.10) manufactured by [employer]. The following summarizes the results of our evaluation. PATIENT BIOGRAPHICAL: Name: Shellyann, Wandrey Patient ID: [id number] Birth Date: 1936/04/21 Height:    60.0 in. Gender:     Female    [age]       Weight:    154.8 lbs. Ethnicity:  White                            Exam Date: 08/03/2022 FRAX* RESULTS:  (version: 3.5) 10-year Probability of Fracture1 Major Osteoporotic Fracture2 Hip Fracture  13.1% 3.3% Population: [place] (Caucasian) Risk Factors: None Based on Femur (Right) Neck BMD 1 -The 10-year probability of fracture may be lower than reported if the patient has received treatment. 2 -Major Osteoporotic Fracture: Clinical Spine, Forearm, Hip or Shoulder *FRAX is a Materials engineer of the [employer] for Metabolic Bone Disease, a [place] (WHO) [facility]. ASSESSMENT: The probability of a major osteoporotic fracture is 13.1% within the next ten years. The probability of a hip fracture is 3.3% within the next  ten years. Your patient Chameka Gunner completed a BMD test on 08/03/2022 using the [place] (software version: 14.10) manufactured by [employer]. The following summarizes the results of our evaluation. Technologist: MTB PATIENT BIOGRAPHICAL: Name: Aleja, Phifer Patient ID: [id number] Birth Date: Oct 30, 1935 Height: 60.0 in. Gender: Female Exam Date: 08/03/2022 Weight: 154.8 lbs. Indications: Caucasian, Hypothyroid, Hysterectomy, Oophorectomy Bilateral, Postmenopausal Fractures: Treatments DENSITOMETRY RESULTS: Site         Region     Measured Date Measured Age WHO Classification Young Adult T-score BMD         %Change vs. Previous Significant Change (*) DualFemur Neck Right 08/03/20[age].0 Osteopenia -1.4 0.839 g/cm2 -6.3% Yes DualFemur Neck Right 02/15/2017 79.5 Normal -1.0 0.895 g/cm2 - - DualFemur Total Mean 08/03/2022 85.0 Normal -0.2 0.982 g/cm2 -2.1% Yes DualFemur Total Mean 02/15/2017 79.5 Normal 0.0 1.003 g/cm2 - - Left Forearm Radius 33% 08/03/2022 85.0 Osteopenia -1.7 0.723 g/cm2 -7.2% Yes Left Forearm Radius 33% 02/15/2017 79.5 Osteopenia -1.1 0.779 g/cm2 - - ASSESSMENT: The BMD measured at Forearm Radius 33% is 0.723 g/cm2 with a T-score of -1.7. This patient is considered osteopenic according to [facility]) criteria. The scan quality is good. Lumbar spine was not utilized due to  advanced degenerative changes. Compared with prior study, there has been significant decrease in the total hip. World Pharmacologist [facility]) criteria for post-menopausal, Caucasian Women: Normal:                   T-score at or above -1 SD Osteopenia/low bone mass: T-score between -1 and -2.5 SD Osteoporosis:             T-score at or below -2.5 SD RECOMMENDATIONS: 1. All patients should optimize calcium and vitamin D intake. 2. Consider FDA-approved medical therapies in postmenopausal women and men aged 60 years and older, based on the following: a. A hip or vertebral(clinical or morphometric) fracture b. T-score < -2.5 at the femoral neck or spine after appropriate evaluation to exclude secondary causes c. Low bone mass (T-score between -1.0 and -2.5 at the femoral neck or spine) and a 10-year probability of a hip fracture > 3% or a 10-year probability of a major osteoporosis-related fracture > 20% based on the [place]-adapted WHO algorithm 3. Clinician judgment and/or patient preferences may indicate treatment for people with 10-year fracture probabilities above or below these levels FOLLOW-UP: People with diagnosed cases of osteoporosis or at high risk for fracture should have regular bone mineral density tests. For patients eligible for Medicare, routine testing is allowed once every 2 years. The testing frequency can be increased to one year for patients who have rapidly progressing disease, those who are receiving or discontinuing medical therapy to restore bone mass, or have additional risk factors. I have reviewed this report, and agree with the above findings. Mark A. Thornton Papas, M.D. [facility] Electronically Signed   By: Lavonia Dana M.D.   On: 08/03/2022 14:05      Assessment/Plan Principal Problem:   Stroke [facility]) Active Problems:   Essential hypertension   HLD (hyperlipidemia)   Hypothyroidism, adult   Stage 3b chronic kidney disease (CKD) (HCC)   Iron deficiency anemia due to  chronic blood loss   Headache   Depression with anxiety   Assessment and Plan:   Stroke [facility]): MRI showed Acute infarcts in the right aspect of the body of the corpus callosum, right frontal cortex, right parietal cortex, and right occipital cortex and  white matter, no retinal artery occlusion.  Consulted Dr. Cheral Marker of neurology  -Admit to tele med bed as inpatient - Check carotid dopplers, MRA of brain - will hold oral Bp meds to allow permissive HTN,  - Plavix (pt is allergic to ASA) - Statin: Pravastatin - fasting lipid panel and HbA1c  - 2D transthoracic echocardiography  - swallowing screen. If fails, will get SLP - PT/OT consult  Essential hypertension -Hold home blood pressure medications -As needed hydralazine for SBP>220 or dBP>110  HLD (hyperlipidemia) -Pravastatin  Hypothyroidism, adult -Synthroid  Stage 3b chronic kidney disease (CKD) ([place]): Renal function close to baseline.  Baseline creatinine 1.5-1.8 recently.  Her creatinine is 1.70, BUN 51, GFR 29. -Follow-up renal function with BMP  Iron deficiency anemia due to chronic blood loss: Hemoglobin 12.0 today -Continue iron supplement  Headache: Patient reports chronic right headache, etiology is not clear. -Check ESR and CRP -As needed Tylenol  Depression with anxiety -Continue home medications     DVT ppx:  SQ Lovenox  Code Status:  DNR (I discussed with patient in the presence of her son and explained the meaning of CODE STATUS. Patient wants to be DNR)  Family Communication:   Yes, patient's son   at bed side.   Disposition Plan:  Anticipate discharge back to previous environment  Consults called:  Dr. Cheral Marker of neurology is consulted.  Admission status and Level of care: Telemetry Medical:  as inpt      Dispo: The patient is from: Home              Anticipated d/c is to: Home              Anticipated d/c date is: 2 days              Patient currently is not medically stable to  d/c.    Severity of Illness:  The appropriate patient status for this patient is INPATIENT. Inpatient status is judged to be reasonable and necessary in order to provide the required intensity of service to ensure the patient's safety. The patient's presenting symptoms, physical exam findings, and initial radiographic and laboratory data in the context of their chronic comorbidities is felt to place them at high risk for further clinical deterioration. Furthermore, it is not anticipated that the patient will be medically stable for discharge from the hospital within 2 midnights of admission.   * I certify that at the point of admission it is my clinical judgment that the patient will require inpatient hospital care spanning beyond 2 midnights from the point of admission due to high intensity of service, high risk for further deterioration and high frequency of surveillance required.*       Date of Service 08/04/2022    Ivor Costa [facility]   If 7PM-7AM, please contact night-coverage [email or web address] 08/04/2022, 6:32 PM

## 2022-08-04 NOTE — Consult Note (Addendum)
NEURO HOSPITALIST CONSULT NOTE   Requesting physician: Dr. Blaine Hamper  Reason for Consult: Multifocal acute strokes seen on MRI   History obtained from:   Patient and Chart     HPI:                                                                                                                                          Tracie Fisher is an 87 y.o. female with a PMHx of skin cancer, HTN, HLD, iron deficiency anemia, CKD, depression, dysrhythmia, HTN and hypothyroidism who presented to the ED this morning from her Ophthalmologist's office for stroke work up after a BRAO was seen on exam there. The patient had been experiencing a black spot blocking the vision in her right eye "that migrates around the eye". She went to Dr. George Ina, her Ophthalmologist, who diagnosed her with a right BRAO. Neurology was consulted to further evaluate.   Home medications include Plavix and pravastatin.   Past Medical History:  Diagnosis Date   Cancer ([place])    SKIN   Complication of anesthesia    Depression    Dysrhythmia    GERD (gastroesophageal reflux disease)    Hiatal hernia    HOH (hard of hearing)    Hypertension    Hypothyroidism    PONV (postoperative nausea and vomiting)    Thyroid disease     Past Surgical History:  Procedure Laterality Date   APPENDECTOMY     BACK SURGERY     MULTIPLE/ROD IN BACK   BREAST CYST ASPIRATION Left "years ago"   CATARACT EXTRACTION W/PHACO Left 03/21/2017   Procedure: CATARACT EXTRACTION PHACO AND INTRAOCULAR LENS PLACEMENT ([place]);  Surgeon: Birder Robson, MD;  Location: [facility] OR[facility];  Service: Ophthalmology;  Laterality: Left;  [place] 00:48 AP% 17.8 CDE 8.57 Fluid pack lot # [id number]   CATARACT EXTRACTION W/PHACO Right 04/11/2017   Procedure: CATARACT EXTRACTION PHACO AND INTRAOCULAR LENS PLACEMENT ([facility]);  Surgeon: Birder Robson, MD;  Location: [facility];  Service: Ophthalmology;  Laterality: Right;  [place] 01:09.5 AP% 15.8 CDE 11.04 Fluid Pack  lot # [id number]   CHOLECYSTECTOMY     COLON SURGERY     BOWEL OBSTRUCTION   COLONOSCOPY WITH PROPOFOL N/A 04/25/2016   Procedure: COLONOSCOPY WITH PROPOFOL;  Surgeon: Jonathon Bellows, MD;  Location: [facility];  Service: Endoscopy;  Laterality: N/A;   ESOPHAGOGASTRODUODENOSCOPY (EGD) WITH PROPOFOL N/A 04/25/2016   Procedure: ESOPHAGOGASTRODUODENOSCOPY (EGD) WITH PROPOFOL;  Surgeon: Jonathon Bellows, MD;  Location: [facility];  Service: Endoscopy;  Laterality: N/A;   EXPLORATORY LAPAROTOMY  10/18/2011   blockage small intestines   GIVENS CAPSULE STUDY N/A 05/11/2016   Procedure: GIVENS CAPSULE STUDY;  Surgeon: Jonathon Bellows, MD;  Location: [facility] END[facility];  Service: Endoscopy;  Laterality: N/A;    Family History  Problem Relation Age of Onset   Breast cancer Sister 43   Heart attack Brother    Heart disease Father              Social History:  reports that she has quit smoking. Her smoking use included cigarettes. She has never used smokeless tobacco. She reports current alcohol use. She reports that she does not use drugs.  Allergies  Allergen Reactions   Aspirin Itching   Penicillins Other (See Comments)    Unknown reaction Has patient had a PCN reaction causing immediate rash, facial/tongue/throat swelling, SOB or lightheadedness with hypotension: Unknown Has patient had a PCN reaction causing severe rash involving mucus membranes or skin necrosis: Unknown Has patient had a PCN reaction that required hospitalization: Unknown Has patient had a PCN reaction occurring within the last 10 years: No If all of the above answers are "NO", then may proceed with Cephalosporin use. Patient does not remember the reaction to penicillin.   Penicillin G     Unknown reaction Has patient had a PCN reaction causing immediate rash, facial/tongue/throat swelling, SOB or lightheadedness with hypotension: Unknown Has patient had a PCN reaction causing severe rash involving mucus membranes or skin necrosis:  Unknown Has patient had a PCN reaction that required hospitalization: Unknown Has patient had a PCN reaction occurring within the last 10 years: No If all of the above answers are "NO", then may proceed with Cephalosporin use.     MEDICATIONS:                                                                                                                     Prior to Admission:  Medications Prior to Admission  Medication Sig Dispense Refill Last Dose   [START ON 08/09/2022] ALPRAZolam (XANAX) 1 MG tablet Take 0.5 mg (1/2 tablet) by mouth in the morning and 1 mg (1 tablet) by mouth at bedtime. 45 tablet 2 08/03/2022 at 2200   clopidogrel (PLAVIX) 75 MG tablet Take by mouth.   08/04/2022 at 0600   cyanocobalamin (V-R VITAMIN B-12) 500 MCG tablet Take 500 mcg by mouth daily.    08/04/2022 at 0600   ferrous gluconate (FERGON) 240 (27 FE) MG tablet Take 240 mg by mouth 2 (two) times daily.   08/04/2022 at 0600   furosemide (LASIX) 20 MG tablet Take 20 mg by mouth 2 (two) times daily.   08/04/2022 at 0600   levothyroxine (SYNTHROID) 50 MCG tablet Take 1 tablet (50 mcg total) by mouth daily before breakfast. 90 tablet 4 08/04/2022 at 0600   losartan (COZAAR) 50 MG tablet Take 50 mg daily by mouth.   08/04/2022 at 0600   metoprolol tartrate (LOPRESSOR) 25 MG tablet Take 25 mg by mouth 2 (two) times daily.   08/04/2022 at 0600   pravastatin (PRAVACHOL) 20 MG tablet Take by mouth.   08/04/2022 at 0600   pregabalin (LYRICA) 75 MG capsule Take 75 mg by mouth 3 (three) times daily.   08/04/2022 at 0600  Probiotic Product (PROBIOTIC DAILY PO) Take 1 capsule by mouth daily as needed.   unk   spironolactone (ALDACTONE) 25 MG tablet Take 1 tablet by mouth daily.   08/04/2022 at 0600   Scheduled:  [START ON 08/05/2022]  stroke: early stages of recovery book   Does not apply Once   ALPRAZolam  0.5-1 mg Oral BID   [START ON 08/05/2022] clopidogrel  75 mg Oral Daily   [START ON 08/05/2022] cyanocobalamin  500 mcg Oral Daily    enoxaparin (LOVENOX) injection  30 mg Subcutaneous Q24H   ferrous gluconate  324 mg Oral BID   [START ON 08/05/2022] levothyroxine  50 mcg Oral QAC breakfast   [START ON 08/05/2022] pravastatin  20 mg Oral Daily   pregabalin  75 mg Oral TID     ROS:                                                                                                                                       Positive for chronic posterior headache and neck pain since January. No speech changes, limb weakness, fevers, cough, SOB, facial droop, numbness, CP, dysuria, or hematuria. Other ROS as per HPI.    Blood pressure 130/78, pulse 90, temperature 98 F (36.7 C), temperature source Oral, resp. rate 18, height 5\' 1"  (1.549 m), weight 69.4 kg, SpO2 99 %.   General Examination:                                                                                                        Physical Exam HEENT-  [place]/AT    Lungs- Respirations unlabored Extremities- Warm and well-perfused  Neurological Examination Mental Status: Awake and alert. Fully oriented x 5. Thought content appropriate.  Speech fluent with intact naming and comprehension.  Able to follow all commands without difficulty. Cranial Nerves: II: Visual fields intact in all 4 quadrants of left eye.  Right eye: Patchy vision loss and overall decreased visual acuity. Temporal visual fields intact to motion; nasal visual fields with a crescentic defect to moving fingers. Pupils are dilated and poorly reactive from probable recent pharmacological dilation at [place] office. III,IV, VI: Left sided ptosis is noted. EOMI. No nystagmus. V: Temp sensation equal bilaterally VII: Smile symmetric VIII: Hearing intact to voice IX,X: No hypophonia or hoarseness XI: Symmetric XII: Midline tongue extension Motor: RUE: 5/5 RLE: 5/5 LUE: 4+/5 LLE: 4+/5 Sensory: Temp and FT intact x 4. No extinction to DSS. Deep Tendon Reflexes: 2+ and  symmetric bilateral  biceps, brachioradialis and patellae Plantars: Right: downgoingLeft: downgoing Cerebellar: No ataxia with FNF bilaterally, but action tremor is noted to RUE and RLE Gait: Deferred    Lab Results: Basic Metabolic Panel: Recent Labs  Lab 08/04/22 1009  NA 137  K 4.5  CL 102  CO2 25  GLUCOSE 73  BUN 51*  CREATININE 1.70*  CALCIUM 9.3    CBC: Recent Labs  Lab 08/04/22 1009  WBC 7.0  NEUTROABS 4.1  HGB 12.0  HCT 37.6  MCV 100.8*  PLT 300    Cardiac Enzymes: No results for input(s): "CKTOTAL", "CKMB", "CKMBINDEX", "TROPONINI" in the last 168 hours.  Lipid Panel: No results for input(s): "CHOL", "TRIG", "HDL", "CHOLHDL", "VLDL", "LDLCALC" in the last 168 hours.  Imaging: MR BRAIN WO CONTRAST  Result Date: 08/04/2022 CLINICAL DATA:  Possible occlusion of the retinal artery, visual symptoms, stroke suspected EXAM: MRI HEAD AND ORBITS WITHOUT CONTRAST TECHNIQUE: Multiplanar, multi-echo pulse sequences of the brain and surrounding structures were acquired without intravenous contrast. Multiplanar, multi-echo pulse sequences of the orbits and surrounding structures were acquired including fat saturation techniques, without intravenous contrast administration. COMPARISON:  03/17/2020 MRI head FINDINGS: MRI HEAD FINDINGS Brain: Restricted diffusion with ADC correlate in the right aspect of the body of the corpus callosum (series 5, image 36), with additional smaller foci of restricted diffusion with ADC correlates in the right frontal cortex (series 5, image 41), right parietal cortex (series 5, image 40), and right occipital cortex and white matter (series 5, images 26-27). These areas are associated with increased T2 hyperintense signal. No acute hemorrhage, mass, mass effect, or midline shift. No hydrocephalus or extra-axial collection. Normal craniocervical junction. Normal cerebral volume for age. Left frontal lobe encephalomalacia from remote infarct. Remote lacunar infarcts in the  right internal capsule and left thalamus. Scattered T2 hyperintense signal in the periventricular white matter, likely the sequela of mild to moderate chronic small vessel ischemic disease. Vascular: Normal arterial flow voids. Skull and upper cervical spine: Normal marrow signal. Other: Trace fluid in left mastoid air cells. MRI ORBITS FINDINGS Orbits: No orbital mass or evidence of inflammation. Status post bilateral lens replacements. Otherwise normal appearance of the globes, optic nerve-sheath complexes, extraocular muscles, orbital fat and lacrimal glands. Visualized sinuses: Clear paranasal sinuses. Soft tissues: Negative. IMPRESSION: 1. Acute infarcts in the right aspect of the body of the corpus callosum, right frontal cortex, right parietal cortex, and right occipital cortex and white matter. No associated hemorrhage or mass effect. Given multiple vascular territories, a central embolic etiology is suspected. 2. No acute orbital process. These results were called by telephone at the time of interpretation on 08/04/2022 at 12:55 pm to provider JESSUP , who verbally acknowledged these results. Electronically Signed   By: Merilyn Baba M.D.   On: 08/04/2022 12:57   MR ORBITS WO CONTRAST  Result Date: 08/04/2022 CLINICAL DATA:  Possible occlusion of the retinal artery, visual symptoms, stroke suspected EXAM: MRI HEAD AND ORBITS WITHOUT CONTRAST TECHNIQUE: Multiplanar, multi-echo pulse sequences of the brain and surrounding structures were acquired without intravenous contrast. Multiplanar, multi-echo pulse sequences of the orbits and surrounding structures were acquired including fat saturation techniques, without intravenous contrast administration. COMPARISON:  03/17/2020 MRI head FINDINGS: MRI HEAD FINDINGS Brain: Restricted diffusion with ADC correlate in the right aspect of the body of the corpus callosum (series 5, image 36), with additional smaller foci of restricted diffusion with ADC correlates in  the right frontal cortex (series 5, image 41), right  parietal cortex (series 5, image 40), and right occipital cortex and white matter (series 5, images 26-27). These areas are associated with increased T2 hyperintense signal. No acute hemorrhage, mass, mass effect, or midline shift. No hydrocephalus or extra-axial collection. Normal craniocervical junction. Normal cerebral volume for age. Left frontal lobe encephalomalacia from remote infarct. Remote lacunar infarcts in the right internal capsule and left thalamus. Scattered T2 hyperintense signal in the periventricular white matter, likely the sequela of mild to moderate chronic small vessel ischemic disease. Vascular: Normal arterial flow voids. Skull and upper cervical spine: Normal marrow signal. Other: Trace fluid in left mastoid air cells. MRI ORBITS FINDINGS Orbits: No orbital mass or evidence of inflammation. Status post bilateral lens replacements. Otherwise normal appearance of the globes, optic nerve-sheath complexes, extraocular muscles, orbital fat and lacrimal glands. Visualized sinuses: Clear paranasal sinuses. Soft tissues: Negative. IMPRESSION: 1. Acute infarcts in the right aspect of the body of the corpus callosum, right frontal cortex, right parietal cortex, and right occipital cortex and white matter. No associated hemorrhage or mass effect. Given multiple vascular territories, a central embolic etiology is suspected. 2. No acute orbital process. These results were called by telephone at the time of interpretation on 08/04/2022 at 12:55 pm to provider JESSUP , who verbally acknowledged these results. Electronically Signed   By: Merilyn Baba M.D.   On: 08/04/2022 12:57   DG Bone Density  Result Date: 08/03/2022 EXAM: DUAL X-RAY ABSORPTIOMETRY (DXA) FOR BONE MINERAL DENSITY IMPRESSION: Dear Dr Ned Card, Your patient MELITTA FERRITTO completed a FRAX assessment on 08/03/2022 using the [place] (analysis version: 14.10) manufactured  by [employer]. The following summarizes the results of our evaluation. PATIENT BIOGRAPHICAL: Name: Ruvi, Crivelli Patient ID: [id number] Birth Date: April 09, 1936 Height:    60.0 in. Gender:     Female    [age]       Weight:    154.8 lbs. Ethnicity:  White                            Exam Date: 08/03/2022 FRAX* RESULTS:  (version: 3.5) 10-year Probability of Fracture1 Major Osteoporotic Fracture2 Hip Fracture 13.1% 3.3% Population: [place] (Caucasian) Risk Factors: None Based on Femur (Right) Neck BMD 1 -The 10-year probability of fracture may be lower than reported if the patient has received treatment. 2 -Major Osteoporotic Fracture: Clinical Spine, Forearm, Hip or Shoulder *FRAX is a Materials engineer of the [employer] for Metabolic Bone Disease, a [place] (WHO) [facility]. ASSESSMENT: The probability of a major osteoporotic fracture is 13.1% within the next ten years. The probability of a hip fracture is 3.3% within the next ten years. Your patient Truby Kilson completed a BMD test on 08/03/2022 using the [place] (software version: 14.10) manufactured by [employer]. The following summarizes the results of our evaluation. Technologist: MTB PATIENT BIOGRAPHICAL: Name: Gwenda, Kisiel Patient ID: [id number] Birth Date: January 10, 1936 Height: 60.0 in. Gender: Female Exam Date: 08/03/2022 Weight: 154.8 lbs. Indications: Caucasian, Hypothyroid, Hysterectomy, Oophorectomy Bilateral, Postmenopausal Fractures: Treatments DENSITOMETRY RESULTS: Site         Region     Measured Date Measured Age WHO Classification Young Adult T-score BMD         %Change vs. Previous Significant Change (*) DualFemur Neck Right 08/03/20[age] Osteopenia -1.4 0.839 g/cm2 -6.3% Yes DualFemur Neck Right 02/15/2017  79.5 Normal -1.0 0.895 g/cm2 - - DualFemur Total Mean 08/03/2022 85.0 Normal -0.2 0.982 g/cm2 -2.1% Yes DualFemur Total Mean 02/15/2017 79.5  Normal 0.0 1.003 g/cm2 - - Left Forearm Radius 33% 08/03/2022 85.0 Osteopenia -1.7 0.723 g/cm2 -7.2% Yes Left Forearm Radius 33% 02/15/2017 79.5 Osteopenia -1.1 0.779 g/cm2 - - ASSESSMENT: The BMD measured at Forearm Radius 33% is 0.723 g/cm2 with a T-score of -1.7. This patient is considered osteopenic according to [facility]) criteria. The scan quality is good. Lumbar spine was not utilized due to advanced degenerative changes. Compared with prior study, there has been significant decrease in the total hip. World Pharmacologist [facility]) criteria for post-menopausal, Caucasian Women: Normal:                   T-score at or above -1 SD Osteopenia/low bone mass: T-score between -1 and -2.5 SD Osteoporosis:             T-score at or below -2.5 SD RECOMMENDATIONS: 1. All patients should optimize calcium and vitamin D intake. 2. Consider FDA-approved medical therapies in postmenopausal women and men aged 36 years and older, based on the following: a. A hip or vertebral(clinical or morphometric) fracture b. T-score < -2.5 at the femoral neck or spine after appropriate evaluation to exclude secondary causes c. Low bone mass (T-score between -1.0 and -2.5 at the femoral neck or spine) and a 10-year probability of a hip fracture > 3% or a 10-year probability of a major osteoporosis-related fracture > 20% based on the [place]-adapted WHO algorithm 3. Clinician judgment and/or patient preferences may indicate treatment for people with 10-year fracture probabilities above or below these levels FOLLOW-UP: People with diagnosed cases of osteoporosis or at high risk for fracture should have regular bone mineral density tests. For patients eligible for Medicare, routine testing is allowed once every 2 years. The testing frequency can be increased to one year for patients who have rapidly progressing disease, those who are receiving or discontinuing medical therapy to restore bone mass, or have additional risk  factors. I have reviewed this report, and agree with the above findings. Mark A. Thornton Papas, M.D. [facility] Electronically Signed   By: Lavonia Dana M.D.   On: 08/03/2022 14:05     Assessment: 87 y.o. female with a PMHx of skin cancer, HTN, HLD, iron deficiency anemia, CKD, depression, dysrhythmia, HTN and hypothyroidism who presented to the ED this morning from her Ophthalmologist's office for stroke work up after a right BRAO was seen on exam there. The patient had been experiencing a black spot blocking the vision in her right eye "that migrates around the eye". Neurology was consulted to further evaluate. - Exam reveals pharmacologically dilated pupils and severe patchy vision loss in right eye. Exam otherwise unremarkable except for action tremor to BUE.  - MRI brain without contrast: Several punctate acute infarcts are seen as follows: A cluster of punctate infarcts in the right aspect of the body of the corpus callosum and individual punctate infarcts in the right frontal cortex, right parietal cortex, right occipital cortex and white matter. No associated hemorrhage or mass effect. Given multiple vascular territories, a central embolic etiology is suspected.  - MRI orbits without contrast: No acute orbital process. - Will need full stroke work up to assess for probable cardioembolic source. DDx includes valvular vegetation, mural thrombus, large PFO and PAF.   Recommendations: - HgbA1c, fasting lipid panel - Given low eGFR, for assessment of neck and  head vasculature would obtain a carotid ultrasound and MRA of head - PT consult, OT consult, Speech consult - TTE. If negative, may need TEE but would carefully weigh risks/benefits given the patient's advanced age.  - Continue home Plavix and pravastatin - Unable to add ASA due to allergy - Risk factor modification - Telemetry monitoring - Frequent neuro checks - NPO until passes stroke swallow screen - May need Zio patch if no  PAF seen on telemetry   Electronically signed: Dr. Kerney Elbe 08/04/2022, 2:14 PM

## 2022-08-04 NOTE — ED Provider Notes (Signed)
[facility] Provider Note    Event Date/Time   First MD Initiated Contact with Patient 08/04/22 1024     (approximate)   History   Chief Complaint Eye Problem (Pt. To ED via POV, sent by ophthalmologist for possible occlusion of retinal artery. Pt. Experiencing black x2 days, that migrates around eye. Dr. Ellender Hose notified.)   HPI  Tracie Fisher is a 87 y.o. female with past medical history of hypertension, hyperlipidemia, iron deficiency anemia, and CKD who presents to the ED complaining of eye problem.  Patient reports that for the past 2 days she has noticed a black spot in her vision in the right eye.  She went to follow-up with ophthalmology earlier today and was seen by Dr. George Ina.  She was diagnosed with a retinal artery occlusion and referred to the ED for further stroke workup.  Patient reports that she has been dealing with chronic posterior headache and neck pain since January, but denies any other symptoms.  She has not had any speech changes, facial droop, numbness, or weakness in her extremities.  She denies any fevers, cough, chest pain, shortness of breath, dysuria, or hematuria.     Physical Exam   Triage Vital Signs: ED Triage Vitals  Enc Vitals Group     BP --      Pulse --      Resp --      Temp --      Temp src --      SpO2 --      Weight 08/04/22 1000 153 lb (69.4 kg)     Height 08/04/22 1000 5\' 1"  (1.549 m)     Head Circumference --      Peak Flow --      Pain Score 08/04/22 0959 5     Pain Loc --      Pain Edu? --      Excl. in [place]? --     Most recent vital signs: Vitals:   08/04/22 1015  BP: 130/78  Pulse: 90  Resp: 18  Temp: 98 F (36.7 C)  SpO2: 99%    Constitutional: Alert and oriented. Eyes: Conjunctivae are normal.  Pupils dilated bilaterally. Head: Atraumatic. Nose: No congestion/rhinnorhea. Mouth/Throat: Mucous membranes are moist.  Cardiovascular: Normal rate, regular rhythm. Grossly normal heart  sounds.  2+ radial pulses bilaterally. Respiratory: Normal respiratory effort.  No retractions. Lungs CTAB. Gastrointestinal: Soft and nontender. No distention. Musculoskeletal: No lower extremity tenderness nor edema.  Neurologic:  Normal speech and language. No gross focal neurologic deficits are appreciated.    ED Results / Procedures / Treatments   Labs (all labs ordered are listed, but only abnormal results are displayed) Labs Reviewed  CBC - Abnormal; Notable for the following components:      Result Value   RBC 3.73 (*)    MCV 100.8 (*)    All other components within normal limits  COMPREHENSIVE METABOLIC PANEL - Abnormal; Notable for the following components:   BUN 51 (*)    Creatinine, Ser 1.70 (*)    GFR, Estimated 29 (*)    All other components within normal limits  URINE DRUG SCREEN, QUALITATIVE ([facility] ONLY) - Abnormal; Notable for the following components:   Benzodiazepine, Ur Scrn POSITIVE (*)    All other components within normal limits  URINALYSIS, ROUTINE W REFLEX MICROSCOPIC - Abnormal; Notable for the following components:   Specific Gravity, Urine <1.005 (*)    All other components within normal  limits  CBG MONITORING, ED - Abnormal; Notable for the following components:   Glucose-Capillary 63 (*)    All other components within normal limits  PROTIME-INR  APTT  DIFFERENTIAL  ETHANOL     EKG  ED ECG REPORT I, Blake Divine, the attending physician, personally viewed and interpreted this ECG.   Date: 08/04/2022  EKG Time: 10:18  Rate: 57  Rhythm: sinus bradycardia  Axis: Normal  Intervals:none  ST&T Change: None  PROCEDURES:  Critical Care performed: No  Procedures   MEDICATIONS ORDERED IN ED: Medications - No data to display   IMPRESSION / MDM / [place] / ED COURSE  I reviewed the triage vital signs and the nursing notes.                              87 y.o. female with past medical history of hypertension,  hyperlipidemia, CKD, and iron deficiency anemia who presents to the ED complaining of black spot in her visual field of the right eye for the past 2 days.  Patient's presentation is most consistent with acute presentation with potential threat to life or bodily function.  Differential diagnosis includes, but is not limited to, retinal artery occlusion, stroke, electrolyte abnormality, anemia.  Patient nontoxic-appearing and in no acute distress, vital signs are unremarkable.  She has no focal neurologic deficits other than the black spot of vision in her right eye, consistent with a diagnosis of central retinal artery occlusion.  She was referred from ophthalmology for further stroke workup including MRI, which has been ordered.  Do not feel CT is indicated at this time given no concerns for intracranial hemorrhage.  Labs thus far show no significant anemia or leukocytosis, additional labs are pending.  Labs show stable CKD with no acute electrolyte abnormality, coags are unremarkable.  Case discussed with hospitalist for admission for further stroke workup.      FINAL CLINICAL IMPRESSION(S) / ED DIAGNOSES   Final diagnoses:  Retinal artery occlusion     Rx / DC Orders   ED Discharge Orders     None        Note:  This document was prepared using Dragon voice recognition software and may include unintentional dictation errors.   Blake Divine, MD 08/04/22 [phone]

## 2022-08-04 NOTE — ED Notes (Signed)
Pt ambulated to room toilet and back to bed by self with steady gait.

## 2022-08-05 ENCOUNTER — Inpatient Hospital Stay
Admit: 2022-08-05 | Discharge: 2022-08-05 | Disposition: A | Discharge status: Home / Self Care | Payer: 59 | Attending: Internal Medicine | Admitting: Internal Medicine

## 2022-08-05 ENCOUNTER — Observation Stay: Payer: 59

## 2022-08-05 DIAGNOSIS — E663 Overweight: Secondary | ICD-10-CM | POA: Insufficient documentation

## 2022-08-05 DIAGNOSIS — I639 Cerebral infarction, unspecified: Secondary | ICD-10-CM | POA: Diagnosis not present

## 2022-08-05 DIAGNOSIS — I10 Essential (primary) hypertension: Secondary | ICD-10-CM | POA: Diagnosis not present

## 2022-08-05 DIAGNOSIS — I6310 Cerebral infarction due to embolism of unspecified precerebral artery: Secondary | ICD-10-CM

## 2022-08-05 DIAGNOSIS — N1832 Chronic kidney disease, stage 3b: Secondary | ICD-10-CM | POA: Diagnosis not present

## 2022-08-05 LAB — BASIC METABOLIC PANEL
Anion gap: 17 — ABNORMAL HIGH (ref 5–15)
BUN: 44 mg/dL — ABNORMAL HIGH (ref 8–23)
CO2: 23 mmol/L (ref 22–32)
Calcium: 9.4 mg/dL (ref 8.9–10.3)
Chloride: 100 mmol/L (ref 98–111)
Creatinine, Ser: 1.57 mg/dL — ABNORMAL HIGH (ref 0.44–1.00)
GFR, Estimated: 32 mL/min — ABNORMAL LOW (ref >60)
Glucose, Bld: 80 mg/dL (ref 70–99)
Potassium: 4.4 mmol/L (ref 3.5–5.1)
Sodium: 140 mmol/L (ref 135–145)

## 2022-08-05 LAB — ECHOCARDIOGRAM COMPLETE
AR max vel: 2.34 cm2
AV Area VTI: 2.16 cm2
AV Area mean vel: 1.92 cm2
AV Mean grad: 4 mmHg
AV Peak grad: 9.1 mmHg
Ao pk vel: 1.51 m/s
Area-P 1/2: 2.72 cm2
Height: 61 in
MV VTI: 2.78 cm2
S' Lateral: 2.5 cm
Weight: 2448 oz

## 2022-08-05 LAB — LIPID PANEL
Cholesterol: 169 mg/dL (ref 0–200)
HDL: 53 mg/dL (ref >40)
LDL Cholesterol: 90 mg/dL (ref 0–99)
Total CHOL/HDL Ratio: 3.2 RATIO
Triglycerides: 131 mg/dL (ref <150)
VLDL: 26 mg/dL (ref 0–40)

## 2022-08-05 LAB — C-REACTIVE PROTEIN: CRP: <0.5 mg/dL (ref <1.0)

## 2022-08-05 NOTE — Progress Notes (Signed)
Physical Therapy Treatment Patient Details Name: Tracie Fisher MRN: [id number] DOB: Feb 28, 1936 Today's Date: 08/05/2022   History of Present Illness Tracie Fisher is a 87 y.o. female with medical history significant of hypertension, hyperlipidemia, stroke, hypothyroidism, depression with anxiety, CKD-3B, anemia, varicose vein, chronic right-sided headache, who presents with right vision loss.  Patient has direct vision loss for 2 days.  Seen by ophthalmologist and sent to ER for possible stroke.  MRI for brain is positive for stroke.    PT Comments    Pt received in Semi-Fowler's position and agreeable to therapy.  Pt stating that she is supposed to discharge later this afternoon, however would still like to ambulate with therapist due to not being up much today.  Pt continues to perform well with the tasks given and requires little to no verbal cuing for bed mobilization.  Pt states she feels more secure with the walker at this time, so FWW was utilized throughout the session.  Pt able to ambulate around the nursing station and back to the room where she was left with all needs met and call bell within reach.     Recommendations for follow up therapy are one component of a multi-disciplinary discharge planning process, led by the attending physician.  Recommendations may be updated based on patient status, additional functional criteria and insurance authorization.  Follow Up Recommendations  Home health PT     Assistance Recommended at Discharge PRN  Patient can return home with the following A little help with walking and/or transfers;A little help with bathing/dressing/bathroom;Assist for transportation;Help with stairs or ramp for entrance   Equipment Recommendations  None recommended by PT    Recommendations for Other Services       Precautions / Restrictions Precautions Precautions: Fall Restrictions Weight Bearing Restrictions: No     Mobility  Bed Mobility                     Transfers                        Ambulation/Gait Ambulation/Gait assistance: Supervision Gait Distance (Feet): 200 Feet Assistive device: Rolling walker (2 wheels) Gait Pattern/deviations: Step-through pattern Gait velocity: decreased     General Gait Details: Pt still feels more stable with walker at this time.   Stairs             Wheelchair Mobility    Modified Rankin (Stroke Patients Only)       Balance Overall balance assessment: No apparent balance deficits (not formally assessed)                                          Cognition                                                Exercises      General Comments General comments (skin integrity, edema, etc.): Pt states she feels like she is at baseline.      Pertinent Vitals/Pain      Home Living Family/patient expects to be discharged to:: Private residence Living Arrangements: Alone Available Help at Discharge: Family;Available PRN/intermittently Type of Home: Apartment Home Access: Level entry       Home  Layout: One level Home Equipment: Rollator (4 wheels);Grab bars - tub/shower      Prior Function            PT Goals (current goals can now be found in the care plan section) Acute Rehab PT Goals Patient Stated Goal: to get stronger and get back to PT/OT in outpatient setting. PT Goal Formulation: With patient/family Time For Goal Achievement: 08/18/22 Potential to Achieve Goals: Good Progress towards PT goals: Progressing toward goals    Frequency    Min 2X/week      PT Plan      Co-evaluation              AM-PAC PT "6 Clicks" Mobility   Outcome Measure  Help needed turning from your back to your side while in a flat bed without using bedrails?: A Little Help needed moving from lying on your back to sitting on the side of a flat bed without using bedrails?: A Little Help needed moving to and from a  bed to a chair (including a wheelchair)?: A Little Help needed standing up from a chair using your arms (e.g., wheelchair or bedside chair)?: A Little Help needed to walk in hospital room?: A Little Help needed climbing 3-5 steps with a railing? : A Little 6 Click Score: 18    End of Session Equipment Utilized During Treatment: Gait belt Activity Tolerance: Patient tolerated treatment well Patient left: in bed;with call bell/phone within reach;with family/visitor present Nurse Communication: Mobility status PT Visit Diagnosis: Unsteadiness on feet (R26.81);Other abnormalities of gait and mobility (R26.89);Muscle weakness (generalized) (M62.81);Difficulty in walking, not elsewhere classified (R26.2)     Time: [id number] PT Time Calculation (min) (ACUTE ONLY): 12 min  Charges:  $Gait Training: 8-22 mins                     Gwenlyn Saran, PT, DPT Physical Therapist - [facility]  08/05/22, 4:33 PM

## 2022-08-05 NOTE — TOC Transition Note (Addendum)
Transition of Care [facility]) - CM/SW Discharge Note   Patient Details  Name: Tracie Fisher MRN: [id number] Date of Birth: 1935/06/29  [facility] CM/SW Contact:  Candie Chroman, LCSW Phone Number: 08/05/2022, 4:54 PM   Clinical Narrative:   Patient has orders to discharge home today. [place] liaison is aware. No further concerns. CSW signing off.  5:05 pm: Start of care for home health will be Monday 3/25. Patient and MD are aware and agreeable.  Final next level of care: [place] Barriers to Discharge: Barriers Resolved   Patient Goals and CMS Choice   Choice offered to / list presented to : Patient  Discharge Placement                  Patient to be transferred to facility by: Son   Patient and family notified of of transfer: 08/05/22  Discharge Plan and Services Additional resources added to the After Visit Summary for       Post Acute Care Choice: Home Health                    HH Arranged: PT, OT, Speech Therapy [place] Agency: [place] ([place]) Date [facility] Agency Contacted: 08/05/22   Representative spoke with at [place]: Floydene Flock  Social Determinants of Health (SDOH) Interventions SDOH Screenings   Food Insecurity: No Food Insecurity (08/04/2022)  Housing: Low Risk  (08/04/2022)  Transportation Needs: No Transportation Needs (08/04/2022)  Utilities: Not At Risk (08/04/2022)  Alcohol Screen: Low Risk  (08/02/2022)  Depression (PHQ2-9): Low Risk  (07/15/2022)  Financial Resource Strain: Low Risk  (08/02/2022)  Physical Activity: Insufficiently Active (08/02/2022)  Social Connections: Moderately Isolated (08/02/2022)  Stress: No Stress Concern Present (08/02/2022)  Tobacco Use: Medium Risk (08/04/2022)     Readmission Risk Interventions     No data to display

## 2022-08-05 NOTE — Hospital Course (Addendum)
Tracie Fisher is a 87 y.o. female with medical history significant of hypertension, hyperlipidemia, stroke, hypothyroidism, depression with anxiety, CKD-3B, anemia, varicose vein, chronic right-sided headache, who presents with right vision loss.  MRI brain showed:  Acute infarcts in the right aspect of the body of the corpus callosum, right frontal cortex, right parietal cortex, and right occipital cortex and white matter. Patient is seen by neurology, not able to start aspirin due to allergy, continue Plavix and atorvastatin. Echo showed EF [id number], grade 1 diastolic dysfunction, mild pulmonary hypertension, no source of thrombosis observed. Zio is placed.

## 2022-08-05 NOTE — TOC Initial Note (Addendum)
Transition of Care [facility]) - Initial/Assessment Note    Patient Details  Name: Tracie Fisher MRN: [id number] Date of Birth: [birth year]  [facility]) CM/SW Contact:    Candie Chroman, LCSW Phone Number: 08/05/2022, 2:13 PM  Clinical Narrative:  CSW met with patient. No supports at bedside. CSW introduced role and explained that PT recommendations would be discussed. Patient is agreeable to home health services. CSW starting search. If unable to secure home health, she prefers outpatient PT and OT at [facility]. No further concerns. CSW encouraged patient to contact CSW as needed. CSW will continue to follow patient for support and facilitate return home once stable. Son will transport her home at discharge.            2:26 pm: [facility] can accept patient for PT, OT, ST.      Expected Discharge Plan: [place] Barriers to Discharge: Continued Medical Work up   Patient Goals and CMS Choice            Expected Discharge Plan and Services     Post Acute Care Choice: [place] arrangements for the past 2 months: Apartment                                      Prior Living Arrangements/Services Living arrangements for the past 2 months: Apartment Lives with:: Self Patient language and need for interpreter reviewed:: Yes Do you feel safe going back to the place where you live?: Yes      Need for Family Participation in Patient Care: Yes (Comment) Care giver support system in place?: Yes (comment) Current home services: DME Criminal Activity/Legal Involvement Pertinent to Current Situation/Hospitalization: No - Comment as needed  Activities of Daily Living Home Assistive Devices/Equipment: Eyeglasses ADL Screening (condition at time of admission) Patient's cognitive ability adequate to safely complete daily activities?: Yes Is the patient deaf or have difficulty hearing?: No Does the patient have difficulty  seeing, even when wearing glasses/contacts?: No Does the patient have difficulty concentrating, remembering, or making decisions?: No Patient able to express need for assistance with ADLs?: Yes Does the patient have difficulty dressing or bathing?: No Independently performs ADLs?: Yes (appropriate for developmental age) Does the patient have difficulty walking or climbing stairs?: No Weakness of Legs: None Weakness of Arms/Hands: None  Permission Sought/Granted Permission sought to share information with : Facility Art therapist granted to share information with : Yes, Verbal Permission Granted     Permission granted to share info w AGENCY: Home health agencies        Emotional Assessment Appearance:: Appears stated age Attitude/Demeanor/Rapport: Engaged, Gracious Affect (typically observed): Accepting, Appropriate, Calm, Pleasant Orientation: : Oriented to Self, Oriented to Place, Oriented to  Time, Oriented to Situation Alcohol / Substance Use: Not Applicable Psych Involvement: No (comment)  Admission diagnosis:  Vision loss [H54.7] Retinal artery occlusion [H34.9] Stroke [facility]) [I63.9] Patient Active Problem List   Diagnosis Date Noted   Stroke ([place]) 08/04/2022   Vision loss 08/04/2022   HLD (hyperlipidemia) 08/04/2022   Depression with anxiety 08/04/2022   Headache 08/04/2022   Senile purpura ([place] ) 06/16/2022   Insomnia 06/16/2022   Varicose veins of both lower extremities without ulcer or inflammation 06/16/2022   Mixed hyperlipidemia 06/12/2022   Bilateral occipital neuralgia 06/12/2022   Sensory ataxia 06/12/2022   Osteopenia of left forearm 06/12/2022  [place] arthritis 05/23/2022   Stage 3b chronic kidney disease (CKD) ([place]) 03/03/2022   Hypothyroidism, adult 03/01/2022   Loss of memory 07/26/2021   Moderate pulmonary hypertension ([place]) 05/20/2021   Primary osteoarthritis involving multiple joints 03/24/2021   Unsteady gait 10/16/2019    History of stroke 03/04/2019   Bilateral foot pain 03/06/2018   Chronic bilateral low back pain 03/06/2018   Neck pain 03/06/2018   Neuropathy 02/06/2018   History of nonmelanoma skin cancer 09/27/2016   Essential hypertension 09/02/2016   Hiatal hernia    Stricture of esophagus    Iron deficiency anemia due to chronic blood loss 03/23/2016   History of tachycardia 07/29/2015   PCP:  Venita Lick, NP Pharmacy:   [facility], [place] [address] [place] [address] Phone: [phone] Fax: [facility], [place] [address] Phone: [phone] Fax: [phone]     Social Determinants of Health (SDOH) Social History: SDOH Screenings   Food Insecurity: No Food Insecurity (08/04/2022)  Housing: Low Risk  (08/04/2022)  Transportation Needs: No Transportation Needs (08/04/2022)  Utilities: Not At Risk (08/04/2022)  Alcohol Screen: Low Risk  (08/02/2022)  Depression (PHQ2-9): Low Risk  (07/15/2022)  Financial Resource Strain: Low Risk  (08/02/2022)  Physical Activity: Insufficiently Active (08/02/2022)  Social Connections: Moderately Isolated (08/02/2022)  Stress: No Stress Concern Present (08/02/2022)  Tobacco Use: Medium Risk (08/04/2022)   SDOH Interventions:     Readmission Risk Interventions     No data to display

## 2022-08-05 NOTE — Discharge Summary (Signed)
Physician Discharge Summary   Patient: Tracie Fisher MRN: [id number] DOB: 02-17-1936  Admit date:     08/04/2022  Discharge date: 08/05/22  Discharge Physician: Sharen Hones   PCP: Venita Lick, NP   Recommendations at discharge:   Follow-up with PCP in 1 week. Follow-up with cardiology in 1 month. Follow-up with neurology in 1 month.  Discharge Diagnoses: Principal Problem:   Stroke [facility]) Active Problems:   Essential hypertension   HLD (hyperlipidemia)   Hypothyroidism, adult   Stage 3b chronic kidney disease (CKD) (HCC)   Iron deficiency anemia due to chronic blood loss   Headache   Depression with anxiety   Overweight (BMI 25.0-29.9)  Resolved Problems:   * No resolved hospital problems. *  Hospital Course: [place] Fisher is a 87 y.o. female with medical history significant of hypertension, hyperlipidemia, stroke, hypothyroidism, depression with anxiety, CKD-3B, anemia, varicose vein, chronic right-sided headache, who presents with right vision loss.  MRI brain showed:  Acute infarcts in the right aspect of the body of the corpus callosum, right frontal cortex, right parietal cortex, and right occipital cortex and white matter. Patient is seen by neurology, not able to start aspirin due to allergy, continue Plavix and atorvastatin. Echo showed EF [id number], grade 1 diastolic dysfunction, mild pulmonary hypertension, no source of thrombosis observed. Zio is placed.   Assessment and Plan: Stroke [facility]): MRI showed Acute infarcts in the right aspect of the body of the corpus callosum, right frontal cortex, right parietal cortex, and right occipital cortex and white matter, no retinal artery occlusion.   Patient is seen by neurology, recommended Plavix in addition to statin.  Patient is allergic to aspirin.  Echocardiogram performed, no source of thrombosis revealed.  ZIO has been placed, patient will follow-up with cardiology in 1 month to determine on anticoagulation.    Essential hypertension Resume home medicines.   HLD (hyperlipidemia) Pravastatin   Hypothyroidism, adult Synthroid   Stage 3b chronic kidney disease (CKD) ([place]):  With PCP as outpatient. Iron deficiency anemia due to chronic blood loss:   Stable.  Headache: Patient reports chronic right headache, etiology is not clear. Follow-up with PCP as outpatient.   Depression with anxiety -Continue home medications        Consultants: Neurology. Procedures performed: None  Disposition: Home health Diet recommendation:  Discharge Diet Orders (From admission, onward)     Start     Ordered   08/05/22 0000  Diet - low sodium heart healthy        08/05/22 1558           Cardiac diet DISCHARGE MEDICATION: Allergies as of 08/05/2022       Reactions   Aspirin Itching   Penicillins Other (See Comments)   Unknown reaction Has patient had a PCN reaction causing immediate rash, facial/tongue/throat swelling, SOB or lightheadedness with hypotension: Unknown Has patient had a PCN reaction causing severe rash involving mucus membranes or skin necrosis: Unknown Has patient had a PCN reaction that required hospitalization: Unknown Has patient had a PCN reaction occurring within the last 10 years: No If all of the above answers are "NO", then may proceed with Cephalosporin use. Patient does not remember the reaction to penicillin.   Penicillin G    Unknown reaction Has patient had a PCN reaction causing immediate rash, facial/tongue/throat swelling, SOB or lightheadedness with hypotension: Unknown Has patient had a PCN reaction causing severe rash involving mucus membranes or skin necrosis: Unknown Has patient had a PCN  reaction that required hospitalization: Unknown Has patient had a PCN reaction occurring within the last 10 years: No If all of the above answers are "NO", then may proceed with Cephalosporin use.        Medication List     TAKE these medications     ALPRAZolam 1 MG tablet Commonly known as: XANAX Take 0.5 mg (1/2 tablet) by mouth in the morning and 1 mg (1 tablet) by mouth at bedtime. Start taking on: August 09, 2022   clopidogrel 75 MG tablet Commonly known as: PLAVIX Take by mouth.   ferrous gluconate 240 (27 FE) MG tablet Commonly known as: FERGON Take 240 mg by mouth 2 (two) times daily.   furosemide 20 MG tablet Commonly known as: LASIX Take 20 mg by mouth 2 (two) times daily.   levothyroxine 50 MCG tablet Commonly known as: SYNTHROID Take 1 tablet (50 mcg total) by mouth daily before breakfast.   losartan 50 MG tablet Commonly known as: COZAAR Take 50 mg daily by mouth.   metoprolol tartrate 25 MG tablet Commonly known as: LOPRESSOR Take 25 mg by mouth 2 (two) times daily.   pravastatin 20 MG tablet Commonly known as: PRAVACHOL Take by mouth.   pregabalin 75 MG capsule Commonly known as: LYRICA Take 75 mg by mouth 3 (three) times daily.   PROBIOTIC DAILY PO Take 1 capsule by mouth daily as needed.   spironolactone 25 MG tablet Commonly known as: ALDACTONE Take 1 tablet by mouth daily.   V-R VITAMIN B-12 500 MCG tablet Generic drug: cyanocobalamin Take 500 mcg by mouth daily.        Follow-up Information     Paraschos, Alexander, MD. Go in 1 month(s).   Specialty: Cardiology Contact information: [facility] [place] [address] [address] [place] Follow up.   Why: They will follow up with you for your home health therapy needs. Start of care Monday 3/25.        Marnee Guarneri T, NP Follow up in 1 week(s).   Specialty: Nurse Practitioner Contact information: [place] [address] [phone]         [facility] Follow up in 1 month(s).   Contact information: [place] [phone]               Discharge  Exam: Danley Danker Weights   08/04/22 1000  Weight: 69.4 kg   General exam: Appears calm and comfortable  Respiratory system: Clear to auscultation. Respiratory effort normal. Cardiovascular system: S1 & S2 heard, RRR. No JVD, murmurs, rubs, gallops or clicks. No pedal edema. Gastrointestinal system: Abdomen is nondistended, soft and nontender. No organomegaly or masses felt. Normal bowel sounds heard. Central nervous system: Alert and oriented. No focal neurological deficits. Extremities: Symmetric 5 x 5 power. Skin: No rashes, lesions or ulcers Psychiatry: Judgement and insight appear normal. Mood & affect appropriate.    Condition at discharge: fair  The results of significant diagnostics from this hospitalization (including imaging, microbiology, ancillary and laboratory) are listed below for reference.   Imaging Studies: ECHOCARDIOGRAM COMPLETE  Result Date: 08/05/2022    ECHOCARDIOGRAM REPORT   Patient Name:   EMELYNE BONCZEK Date of Exam: 08/05/2022 Medical Rec #:  [id number]       Height:       61.0 in Accession #:    [id number]  Weight:       153.0 lb Date of Birth:  April 19, 1936        BSA:          1.686 m Patient Age:    18 years        BP:           122/61 mmHg Patient Gender: F               HR:           70 bpm. Exam Location:  [facility] Procedure: 2D Echo, Cardiac Doppler and Color Doppler Indications:     Stroke  History:         Patient has no prior history of Echocardiogram examinations.                  Stroke and Pulmonary HTN, Arrythmias:Tachycardia; Risk                  Factors:Hypertension and Dyslipidemia.  Sonographer:     Wenda Low Referring Phys:  [place] Diagnosing Phys: Isaias Cowman MD IMPRESSIONS  1. Left ventricular ejection fraction, by estimation, is 65 to 70%. The left ventricle has normal function. The left ventricle has no regional wall motion abnormalities. Left ventricular diastolic parameters are consistent with Grade I diastolic dysfunction  (impaired relaxation).  2. Right ventricular systolic function is normal. The right ventricular size is normal. There is mildly elevated pulmonary artery systolic pressure.  3. The mitral valve is normal in structure. Mild mitral valve regurgitation. No evidence of mitral stenosis.  4. The aortic valve is normal in structure. Aortic valve regurgitation is trivial. No aortic stenosis is present.  5. The inferior vena cava is normal in size with greater than 50% respiratory variability, suggesting right atrial pressure of 3 mmHg. FINDINGS  Left Ventricle: Left ventricular ejection fraction, by estimation, is 65 to 70%. The left ventricle has normal function. The left ventricle has no regional wall motion abnormalities. The left ventricular internal cavity size was normal in size. There is  no left ventricular hypertrophy. Left ventricular diastolic parameters are consistent with Grade I diastolic dysfunction (impaired relaxation). Right Ventricle: The right ventricular size is normal. No increase in right ventricular wall thickness. Right ventricular systolic function is normal. There is mildly elevated pulmonary artery systolic pressure. The tricuspid regurgitant velocity is 2.89  m/s, and with an assumed right atrial pressure of 8 mmHg, the estimated right ventricular systolic pressure is 123456 mmHg. Left Atrium: Left atrial size was normal in size. Right Atrium: Right atrial size was normal in size. Pericardium: There is no evidence of pericardial effusion. Mitral Valve: The mitral valve is normal in structure. Mild mitral valve regurgitation. No evidence of mitral valve stenosis. MV peak gradient, 3.3 mmHg. The mean mitral valve gradient is 1.0 mmHg. Tricuspid Valve: The tricuspid valve is normal in structure. Tricuspid valve regurgitation is mild . No evidence of tricuspid stenosis. Aortic Valve: The aortic valve is normal in structure. Aortic valve regurgitation is trivial. No aortic stenosis is present. Aortic  valve mean gradient measures 4.0 mmHg. Aortic valve peak gradient measures 9.1 mmHg. Aortic valve area, by VTI measures 2.16 cm. Pulmonic Valve: The pulmonic valve was normal in structure. Pulmonic valve regurgitation is not visualized. No evidence of pulmonic stenosis. Aorta: The aortic root is normal in size and structure. Venous: The inferior vena cava is normal in size with greater than 50% respiratory variability, suggesting right atrial pressure of 3 mmHg. IAS/Shunts:  No atrial level shunt detected by color flow Doppler.  LEFT VENTRICLE PLAX 2D LVIDd:         4.20 cm   Diastology LVIDs:         2.50 cm   LV e' medial:    7.07 cm/s LV PW:         1.00 cm   LV E/e' medial:  8.5 LV IVS:        0.80 cm   LV e' lateral:   7.07 cm/s LVOT diam:     2.10 cm   LV E/e' lateral: 8.5 LV SV:         67 LV SV Index:   39 LVOT Area:     3.46 cm  RIGHT VENTRICLE RV Basal diam:  3.15 cm RV Mid diam:    2.90 cm RV S prime:     15.80 cm/s TAPSE (M-mode): 2.3 cm LEFT ATRIUM             Index        RIGHT ATRIUM           Index LA diam:        2.60 cm 1.54 cm/m   RA Area:     18.00 cm LA Vol (A2C):   30.5 ml 18.10 ml/m  RA Volume:   54.00 ml  32.04 ml/m LA Vol (A4C):   29.5 ml 17.50 ml/m LA Biplane Vol: 32.0 ml 18.99 ml/m  AORTIC VALVE                    PULMONIC VALVE AV Area (Vmax):    2.34 cm     PV Vmax:       0.90 m/s AV Area (Vmean):   1.92 cm     PV Peak grad:  3.2 mmHg AV Area (VTI):     2.16 cm AV Vmax:           151.00 cm/s AV Vmean:          97.000 cm/s AV VTI:            0.308 m AV Peak Grad:      9.1 mmHg AV Mean Grad:      4.0 mmHg LVOT Vmax:         102.00 cm/s LVOT Vmean:        53.800 cm/s LVOT VTI:          0.192 m LVOT/AV VTI ratio: 0.62  AORTA Ao Root diam: 3.10 cm Ao Asc diam:  3.00 cm MITRAL VALVE               TRICUSPID VALVE MV Area (PHT): 2.72 cm    TR Peak grad:   33.4 mmHg MV Area VTI:   2.78 cm    TR Vmax:        289.00 cm/s MV Peak grad:  3.3 mmHg MV Mean grad:  1.0 mmHg    SHUNTS MV Vmax:        0.91 m/s    Systemic VTI:  0.19 m MV Vmean:      51.2 cm/s   Systemic Diam: 2.10 cm MV Decel Time: 279 msec MV E velocity: 60.30 cm/s MV A velocity: 81.80 cm/s MV E/A ratio:  0.74 Isaias Cowman MD Electronically signed by Isaias Cowman MD Signature Date/Time: 08/05/2022/3:33:17 PM    Final    MR ANGIO HEAD WO CONTRAST  Result Date: 08/05/2022 CLINICAL DATA:  Follow-up examination for stroke. EXAM: MRA HEAD  WITHOUT CONTRAST TECHNIQUE: Angiographic images of the Circle of Willis were acquired using MRA technique without intravenous contrast. COMPARISON:  Prior MRI from earlier the same day. FINDINGS: Anterior circulation: Visualized distal cervical segments of the internal carotid arteries are widely patent with antegrade flow. Mild atheromatous irregularity about the carotid siphons bilaterally. Resultant mild stenosis at the supraclinoid right ICA (series 1032, image 9). No significant stenosis about the contralateral left carotid siphon. A1 segments patent bilaterally. Left ACA mildly irregular but patent to its distal aspect without significant stenosis. There is a focal severe right A2 stenosis (series 1032, image 12). This is also seen on source time-of-flight sequence (series 5, image 158). No M1 stenosis or occlusion. No proximal MCA branch occlusion. Short-segment mild-to-moderate stenosis noted at the right M2 segment, inferior division (series 1032, image 10). Distal MCA branches well perfused and fairly symmetric. Posterior circulation: Both vertebral arteries are patent to the vertebrobasilar junction without stenosis. Left vertebral artery dominant. Both PICA grossly patent at their origins. Basilar patent without stenosis. Superior cerebral arteries patent bilaterally. Both PCAs primarily supplied via the basilar. Left PCA patent to its distal aspect without stenosis. Atheromatous change with associated mild to moderate multifocal stenoses noted involving the proximal right P1 and  P2 segments (series 1044, image 7). Anatomic variants: None significant. Other: No intracranial aneurysm. IMPRESSION: 1. Negative intracranial MRA for large vessel occlusion. 2. Intracranial atherosclerotic disease, with most notable finding consisting of a focal severe right A2 stenosis. Additional mild to moderate narrowing at the supraclinoid right ICA, right M2 segment, and proximal right PCA as above. Electronically Signed   By: Jeannine Boga M.D.   On: 08/05/2022 00:38   MM 3D SCREEN BREAST BILATERAL  Result Date: 08/04/2022 CLINICAL DATA:  Screening. EXAM: DIGITAL SCREENING BILATERAL MAMMOGRAM WITH TOMOSYNTHESIS AND CAD TECHNIQUE: Bilateral screening digital craniocaudal and mediolateral oblique mammograms were obtained. Bilateral screening digital breast tomosynthesis was performed. The images were evaluated with computer-aided detection. COMPARISON:  Previous exam(s). ACR Breast Density Category b: There are scattered areas of fibroglandular density. FINDINGS: There are no findings suspicious for malignancy. IMPRESSION: No mammographic evidence of malignancy. A result letter of this screening mammogram will be mailed directly to the patient. RECOMMENDATION: Screening mammogram in one year. (Code:SM-B-01Y) BI-RADS CATEGORY  1: Negative. Electronically Signed   By: Ammie Ferrier M.D.   On: 08/04/2022 14:32   MR BRAIN WO CONTRAST  Result Date: 08/04/2022 CLINICAL DATA:  Possible occlusion of the retinal artery, visual symptoms, stroke suspected EXAM: MRI HEAD AND ORBITS WITHOUT CONTRAST TECHNIQUE: Multiplanar, multi-echo pulse sequences of the brain and surrounding structures were acquired without intravenous contrast. Multiplanar, multi-echo pulse sequences of the orbits and surrounding structures were acquired including fat saturation techniques, without intravenous contrast administration. COMPARISON:  03/17/2020 MRI head FINDINGS: MRI HEAD FINDINGS Brain: Restricted diffusion with ADC  correlate in the right aspect of the body of the corpus callosum (series 5, image 36), with additional smaller foci of restricted diffusion with ADC correlates in the right frontal cortex (series 5, image 41), right parietal cortex (series 5, image 40), and right occipital cortex and white matter (series 5, images 26-27). These areas are associated with increased T2 hyperintense signal. No acute hemorrhage, mass, mass effect, or midline shift. No hydrocephalus or extra-axial collection. Normal craniocervical junction. Normal cerebral volume for age. Left frontal lobe encephalomalacia from remote infarct. Remote lacunar infarcts in the right internal capsule and left thalamus. Scattered T2 hyperintense signal in the periventricular white matter, likely the sequela  of mild to moderate chronic small vessel ischemic disease. Vascular: Normal arterial flow voids. Skull and upper cervical spine: Normal marrow signal. Other: Trace fluid in left mastoid air cells. MRI ORBITS FINDINGS Orbits: No orbital mass or evidence of inflammation. Status post bilateral lens replacements. Otherwise normal appearance of the globes, optic nerve-sheath complexes, extraocular muscles, orbital fat and lacrimal glands. Visualized sinuses: Clear paranasal sinuses. Soft tissues: Negative. IMPRESSION: 1. Acute infarcts in the right aspect of the body of the corpus callosum, right frontal cortex, right parietal cortex, and right occipital cortex and white matter. No associated hemorrhage or mass effect. Given multiple vascular territories, a central embolic etiology is suspected. 2. No acute orbital process. These results were called by telephone at the time of interpretation on 08/04/2022 at 12:55 pm to provider JESSUP , who verbally acknowledged these results. Electronically Signed   By: Merilyn Baba M.D.   On: 08/04/2022 12:57   MR ORBITS WO CONTRAST  Result Date: 08/04/2022 CLINICAL DATA:  Possible occlusion of the retinal artery, visual  symptoms, stroke suspected EXAM: MRI HEAD AND ORBITS WITHOUT CONTRAST TECHNIQUE: Multiplanar, multi-echo pulse sequences of the brain and surrounding structures were acquired without intravenous contrast. Multiplanar, multi-echo pulse sequences of the orbits and surrounding structures were acquired including fat saturation techniques, without intravenous contrast administration. COMPARISON:  03/17/2020 MRI head FINDINGS: MRI HEAD FINDINGS Brain: Restricted diffusion with ADC correlate in the right aspect of the body of the corpus callosum (series 5, image 36), with additional smaller foci of restricted diffusion with ADC correlates in the right frontal cortex (series 5, image 41), right parietal cortex (series 5, image 40), and right occipital cortex and white matter (series 5, images 26-27). These areas are associated with increased T2 hyperintense signal. No acute hemorrhage, mass, mass effect, or midline shift. No hydrocephalus or extra-axial collection. Normal craniocervical junction. Normal cerebral volume for age. Left frontal lobe encephalomalacia from remote infarct. Remote lacunar infarcts in the right internal capsule and left thalamus. Scattered T2 hyperintense signal in the periventricular white matter, likely the sequela of mild to moderate chronic small vessel ischemic disease. Vascular: Normal arterial flow voids. Skull and upper cervical spine: Normal marrow signal. Other: Trace fluid in left mastoid air cells. MRI ORBITS FINDINGS Orbits: No orbital mass or evidence of inflammation. Status post bilateral lens replacements. Otherwise normal appearance of the globes, optic nerve-sheath complexes, extraocular muscles, orbital fat and lacrimal glands. Visualized sinuses: Clear paranasal sinuses. Soft tissues: Negative. IMPRESSION: 1. Acute infarcts in the right aspect of the body of the corpus callosum, right frontal cortex, right parietal cortex, and right occipital cortex and white matter. No  associated hemorrhage or mass effect. Given multiple vascular territories, a central embolic etiology is suspected. 2. No acute orbital process. These results were called by telephone at the time of interpretation on 08/04/2022 at 12:55 pm to provider JESSUP , who verbally acknowledged these results. Electronically Signed   By: Merilyn Baba M.D.   On: 08/04/2022 12:57   DG Bone Density  Result Date: 08/03/2022 EXAM: DUAL X-RAY ABSORPTIOMETRY (DXA) FOR BONE MINERAL DENSITY IMPRESSION: Dear Dr Ned Card, Your patient ROSETTE SUTHERLIN completed a FRAX assessment on 08/03/2022 using the [place] (analysis version: 14.10) manufactured by [employer]. The following summarizes the results of our evaluation. PATIENT BIOGRAPHICAL: Name: Nuriya, Lazier Patient ID: [id number] Birth Date: July 23, 1935 Height:    60.0 in. Gender:     Female    [age]  [age]       Weight:    154.8 lbs. Ethnicity:  White                            Exam Date: 08/03/2022 FRAX* RESULTS:  (version: 3.5) 10-year Probability of Fracture1 Major Osteoporotic Fracture2 Hip Fracture 13.1% 3.3% Population: [place] (Caucasian) Risk Factors: None Based on Femur (Right) Neck BMD 1 -The 10-year probability of fracture may be lower than reported if the patient has received treatment. 2 -Major Osteoporotic Fracture: Clinical Spine, Forearm, Hip or Shoulder *FRAX is a Materials engineer of the [employer] for Metabolic Bone Disease, a [place] (WHO) [facility]. ASSESSMENT: The probability of a major osteoporotic fracture is 13.1% within the next ten years. The probability of a hip fracture is 3.3% within the next ten years. Your patient Monchelle Zaccagnino completed a BMD test on 08/03/2022 using the [place] (software version: 14.10) manufactured by [employer]. The following summarizes the results of our evaluation. Technologist: MTB PATIENT BIOGRAPHICAL: Name: Rasheeta, Hodel Patient ID: [id number] Birth Date: 1935-10-10 Height: 60.0 in. Gender: Female Exam Date: 08/03/2022 Weight: 154.8 lbs. Indications: Caucasian, Hypothyroid, Hysterectomy, Oophorectomy Bilateral, Postmenopausal Fractures: Treatments DENSITOMETRY RESULTS: Site         Region     Measured Date Measured Age WHO Classification Young Adult T-score BMD         %Change vs. Previous Significant Change (*) DualFemur Neck Right 08/03/20[age].0 Osteopenia -1.4 0.839 g/cm2 -6.3% Yes DualFemur Neck Right 02/15/2017 79.5 Normal -1.0 0.895 g/cm2 - - DualFemur Total Mean 08/03/2022 85.0 Normal -0.2 0.982 g/cm2 -2.1% Yes DualFemur Total Mean 02/15/2017 79.5 Normal 0.0 1.003 g/cm2 - - Left Forearm Radius 33% 08/03/2022 85.0 Osteopenia -1.7 0.723 g/cm2 -7.2% Yes Left Forearm Radius 33% 02/15/2017 79.5 Osteopenia -1.1 0.779 g/cm2 - - ASSESSMENT: The BMD measured at Forearm Radius 33% is 0.723 g/cm2 with a T-score of -1.7. This patient is considered osteopenic according to [place] [facility]) criteria. The scan quality is good. Lumbar spine was not utilized due to advanced degenerative changes. Compared with prior study, there has been significant decrease in the total hip. World Pharmacologist [facility]) criteria for post-menopausal, Caucasian Women: Normal:                   T-score at or above -1 SD Osteopenia/low bone mass: T-score between -1 and -2.5 SD Osteoporosis:             T-score at or below -2.5 SD RECOMMENDATIONS: 1. All patients should optimize calcium and vitamin D intake. 2. Consider FDA-approved medical therapies in postmenopausal women and men aged 18 years and older, based on the following: a. A hip or vertebral(clinical or morphometric) fracture b. T-score < -2.5 at the femoral neck or spine after appropriate evaluation to exclude secondary causes c. Low bone mass (T-score between -1.0 and -2.5 at the femoral neck or spine) and a 10-year probability of a hip fracture > 3% or a 10-year probability of a  major osteoporosis-related fracture > 20% based on the [place]-adapted WHO algorithm 3. Clinician judgment and/or patient preferences may indicate treatment for people with 10-year fracture probabilities above or below these levels FOLLOW-UP: People with diagnosed cases of osteoporosis or at high risk for fracture should have regular bone mineral density tests. For patients eligible for Medicare, routine testing is allowed once every 2 years. The testing frequency  can be increased to one year for patients who have rapidly progressing disease, those who are receiving or discontinuing medical therapy to restore bone mass, or have additional risk factors. I have reviewed this report, and agree with the above findings. Mark A. Thornton Papas, M.D. [facility] Electronically Signed   By: Lavonia Dana M.D.   On: 08/03/2022 14:05    Microbiology: Results for orders placed or performed during the hospital encounter of 04/25/22  Resp panel by RT-PCR (RSV, Flu A&B, Covid) Anterior Nasal Swab     Status: Abnormal   Collection Time: 04/25/22 11:33 AM   Specimen: Anterior Nasal Swab  Result Value Ref Range Status   SARS Coronavirus 2 by RT PCR NEGATIVE NEGATIVE Final    Comment: (NOTE) SARS-CoV-2 target nucleic acids are NOT DETECTED.  The SARS-CoV-2 RNA is generally detectable in upper respiratory specimens during the acute phase of infection. The lowest concentration of SARS-CoV-2 viral copies this assay can detect is 138 copies/mL. A negative result does not preclude SARS-Cov-2 infection and should not be used as the sole basis for treatment or other patient management decisions. A negative result may occur with  improper specimen collection/handling, submission of specimen other than nasopharyngeal swab, presence of viral mutation(s) within the areas targeted by this assay, and inadequate number of viral copies(<138 copies/mL). A negative result must be combined with clinical observations, patient  history, and epidemiological information. The expected result is Negative.  Fact Sheet for Patients:  EntrepreneurPulse.com.au  Fact Sheet for Healthcare Providers:  IncredibleEmployment.be  This test is no t yet approved or cleared by the [place] FDA and  has been authorized for detection and/or diagnosis of SARS-CoV-2 by FDA under an Emergency Use Authorization (EUA). This EUA will remain  in effect (meaning this test can be used) for the duration of the COVID-19 declaration under Section 564(b)(1) of the Act, 21 U.S.C.section 360bbb-3(b)(1), unless the authorization is terminated  or revoked sooner.       Influenza A by PCR POSITIVE (A) NEGATIVE Final   Influenza B by PCR NEGATIVE NEGATIVE Final    Comment: (NOTE) The Xpert Xpress SARS-CoV-2/FLU/RSV plus assay is intended as an aid in the diagnosis of influenza from Nasopharyngeal swab specimens and should not be used as a sole basis for treatment. Nasal washings and aspirates are unacceptable for Xpert Xpress SARS-CoV-2/FLU/RSV testing.  Fact Sheet for Patients: EntrepreneurPulse.com.au  Fact Sheet for Healthcare Providers: IncredibleEmployment.be  This test is not yet approved or cleared by the [place] FDA and has been authorized for detection and/or diagnosis of SARS-CoV-2 by FDA under an Emergency Use Authorization (EUA). This EUA will remain in effect (meaning this test can be used) for the duration of the COVID-19 declaration under Section 564(b)(1) of the Act, 21 U.S.C. section 360bbb-3(b)(1), unless the authorization is terminated or revoked.     Resp Syncytial Virus by PCR NEGATIVE NEGATIVE Final    Comment: (NOTE) Fact Sheet for Patients: EntrepreneurPulse.com.au  Fact Sheet for Healthcare Providers: IncredibleEmployment.be  This test is not yet approved or cleared by the [place]  FDA and has been authorized for detection and/or diagnosis of SARS-CoV-2 by FDA under an Emergency Use Authorization (EUA). This EUA will remain in effect (meaning this test can be used) for the duration of the COVID-19 declaration under Section 564(b)(1) of the Act, 21 U.S.C. section 360bbb-3(b)(1), unless the authorization is terminated or revoked.  Performed at [facility], [address] [place], [place] [address]  Labs: CBC: Recent Labs  Lab 08/04/22 1009  WBC 7.0  NEUTROABS 4.1  HGB 12.0  HCT 37.6  MCV 100.8*  PLT [id number]   Basic Metabolic Panel: Recent Labs  Lab 08/04/22 1009 08/05/22 0704  NA 137 140  K 4.5 4.4  CL 102 100  CO2 25 23  GLUCOSE 73 80  BUN 51* 44*  CREATININE 1.70* 1.57*  CALCIUM 9.3 9.4   Liver Function Tests: Recent Labs  Lab 08/04/22 1009  AST 25  ALT 14  ALKPHOS 67  BILITOT 0.6  PROT 7.3  ALBUMIN 4.0   CBG: Recent Labs  Lab 08/04/22 1009  GLUCAP 63*    Discharge time spent: greater than 30 minutes.  Signed: Sharen Hones, MD [facility] 08/05/2022

## 2022-08-05 NOTE — Care Management Obs Status (Signed)
[facility] NOTIFICATION   Patient Details  Name: ANSHIKA RAFFENSPERGER MRN: [id number] Date of Birth: 03-Feb-1936   Medicare Observation Status Notification Given:  Yes    Candie Chroman, LCSW 08/05/2022, 4:53 PM

## 2022-08-05 NOTE — Evaluation (Signed)
Occupational Therapy Evaluation Patient Details Name: Tracie Fisher MRN: [id number] DOB: 01-Jun-1935 Today's Date: 08/05/2022   History of Present Illness Tracie Fisher is a 87 y.o. female with medical history significant of hypertension, hyperlipidemia, stroke, hypothyroidism, depression with anxiety, CKD-3B, anemia, varicose vein, chronic right-sided headache, who presents with right vision loss.  Patient has direct vision loss for 2 days.  Seen by ophthalmologist and sent to ER for possible stroke.  MRI for brain is positive for stroke.   Clinical Impression   Patient received for OT evaluation. See flowsheet below for details of function. Generally, patient (I) for bed mobility, MOD (I) with RW for functional mobility, and set up for ADLs. Has R visual field deficit from CVA; would benefit from further therapy to ensure safety in home environment. Patient will benefit from continued OT while in acute care.       Recommendations for follow up therapy are one component of a multi-disciplinary discharge planning process, led by the attending physician.  Recommendations may be updated based on patient status, additional functional criteria and insurance authorization.   Follow Up Recommendations  Home health OT     Assistance Recommended at Discharge Set up Supervision/Assistance  Patient can return home with the following Assist for transportation    Functional Status Assessment  Patient has had a recent decline in their functional status and demonstrates the ability to make significant improvements in function in a reasonable and predictable amount of time.  Equipment Recommendations  None recommended by OT    Recommendations for Other Services       Precautions / Restrictions Precautions Precautions: Fall Restrictions Weight Bearing Restrictions: No      Mobility Bed Mobility Overal bed mobility: Modified Independent                  Transfers Overall transfer  level: Modified independent Equipment used: Rolling walker (2 wheels)               General transfer comment: No assistance      Balance Overall balance assessment: No apparent balance deficits (not formally assessed)                                         ADL either performed or assessed with clinical judgement   ADL Overall ADL's : At baseline                                       General ADL Comments: Pt donned shoes at EOB independently; walked to sink with RW without assistance; able to perform oral hygiene independently; required cue for having toothpaste left on her chin (low lighting, as sink light not working well).     Vision Baseline Vision/History: 1 Wears glasses Vision Assessment?: Yes Additional Comments: Pt has been seen by eye provider prior to hospitalization and has R field cut in R eye; states no deficits in her L eye. Is able to find items on countertop on R side without issue. Would recommend pt be further evaluated by vision rehab specialist or opthalmologist prior to resuming driving.     Perception     Praxis      Pertinent Vitals/Pain Pain Assessment Pain Assessment: No/denies pain     Hand Dominance Right   Extremity/Trunk Assessment Upper Extremity  Assessment Upper Extremity Assessment: Overall WFL for tasks assessed   Lower Extremity Assessment Lower Extremity Assessment: Overall WFL for tasks assessed       Communication Communication Communication: No difficulties   Cognition Arousal/Alertness: Awake/alert Behavior During Therapy: WFL for tasks assessed/performed Overall Cognitive Status: Within Functional Limits for tasks assessed                                 General Comments: Pt has dx of memory deficits in her chart; is able to provide all information today; oriented today.     General Comments  Pt states she feels like she is at baseline.    Exercises     Shoulder  Instructions      Home Living Family/patient expects to be discharged to:: Private residence Living Arrangements: Alone Available Help at Discharge: Family;Available PRN/intermittently Type of Home: Apartment Home Access: Level entry     Home Layout: One level     Bathroom Shower/Tub: Tub/shower unit (pt states she sponge bathes 2/2 being afraid to step into her shower.)   Bathroom Toilet: Standard Bathroom Accessibility: Yes How Accessible: Accessible via walker Home Equipment: Rollator (4 wheels);Grab bars - tub/shower      Lives With: Alone    Prior Functioning/Environment Prior Level of Function : Independent/Modified Independent             Mobility Comments: Pt states that she uses her rollator in the community or to take her trash out; otherwise, she does not use rollator in her home or going to the mailbox. ADLs Comments: Pt states she drives to the grocery store; is (I) with medications. (I) ADL/IADL. States she lives in a senior apartment building. Denies falls.        OT Problem List: Impaired vision/perception      OT Treatment/Interventions: Self-care/ADL training;Therapeutic activities;Visual/perceptual remediation/compensation    OT Goals(Current goals can be found in the care plan section) Acute Rehab OT Goals Patient Stated Goal: Go home OT Goal Formulation: With patient Time For Goal Achievement: 08/19/22 Potential to Achieve Goals: Good ADL Goals Pt Will Perform Grooming: with modified independence;standing Pt Will Perform Lower Body Dressing: with modified independence;sit to/from stand Pt Will Transfer to Toilet: with modified independence;ambulating Pt Will Perform Tub/Shower Transfer: with modified independence;grab bars  OT Frequency: Min 2X/week    Co-evaluation              AM-PAC OT "6 Clicks" Daily Activity     Outcome Measure Help from another person eating meals?: None Help from another person taking care of personal  grooming?: None Help from another person toileting, which includes using toliet, bedpan, or urinal?: None Help from another person bathing (including washing, rinsing, drying)?: None Help from another person to put on and taking off regular upper body clothing?: None Help from another person to put on and taking off regular lower body clothing?: None 6 Click Score: 24   End of Session Equipment Utilized During Treatment: Rolling walker (2 wheels) Nurse Communication: Mobility status  Activity Tolerance: Patient tolerated treatment well Patient left: in bed;with call bell/phone within reach;with bed alarm set  OT Visit Diagnosis: Other symptoms and signs involving the nervous system [id number])                Time: [id number] OT Time Calculation (min): 20 min Charges:  OT General Charges $OT Visit: 1 Visit OT Evaluation $OT Eval Moderate Complexity: 1 Mod  Waymon Amato, MS, OTR/L  Vania Rea 08/05/2022, 4:27 PM

## 2022-08-05 NOTE — Evaluation (Addendum)
Speech Language Pathology Evaluation Patient Details Name: MEHJABEEN TORRIS MRN: [id number] DOB: 1935-05-24 Today's Date: 08/05/2022 Time: [id number] SLP Time Calculation (min) (ACUTE ONLY): 33 min  Problem List:  Patient Active Problem List   Diagnosis Date Noted   Stroke ([place]) 08/04/2022   Vision loss 08/04/2022   HLD (hyperlipidemia) 08/04/2022   Depression with anxiety 08/04/2022   Headache 08/04/2022   Senile purpura ([place]) 06/16/2022   Insomnia 06/16/2022   Varicose veins of both lower extremities without ulcer or inflammation 06/16/2022   Mixed hyperlipidemia 06/12/2022   Bilateral occipital neuralgia 06/12/2022   Sensory ataxia 06/12/2022   Osteopenia of left forearm 06/12/2022   CMC arthritis 05/23/2022   Stage 3b chronic kidney disease (CKD) ([place]) 03/03/2022   Hypothyroidism, adult 03/01/2022   Loss of memory 07/26/2021   Moderate pulmonary hypertension ([place]) 05/20/2021   Primary osteoarthritis involving multiple joints 03/24/2021   Unsteady gait 10/16/2019   History of stroke 03/04/2019   Bilateral foot pain 03/06/2018   Chronic bilateral low back pain 03/06/2018   Neck pain 03/06/2018   Neuropathy 02/06/2018   History of nonmelanoma skin cancer 09/27/2016   Essential hypertension 09/02/2016   Hiatal hernia    Stricture of esophagus    Iron deficiency anemia due to chronic blood loss 03/23/2016   History of tachycardia 07/29/2015   Past Medical History:  Past Medical History:  Diagnosis Date   Cancer ([place])    SKIN   Complication of anesthesia    Depression    Dysrhythmia    GERD (gastroesophageal reflux disease)    Hiatal hernia    HOH (hard of hearing)    Hypertension    Hypothyroidism    PONV (postoperative nausea and vomiting)    Thyroid disease    Past Surgical History:  Past Surgical History:  Procedure Laterality Date   APPENDECTOMY     BACK SURGERY     MULTIPLE/ROD IN BACK   BREAST CYST ASPIRATION Left "years ago"   CATARACT EXTRACTION  W/PHACO Left 03/21/2017   Procedure: CATARACT EXTRACTION PHACO AND INTRAOCULAR LENS PLACEMENT ([place]);  Surgeon: Birder Robson, MD;  Location: [facility] ORS;  Service: Ophthalmology;  Laterality: Left;  [place] 00:48 AP% 17.8 CDE 8.57 Fluid pack lot # [id number]   CATARACT EXTRACTION W/PHACO Right 04/11/2017   Procedure: CATARACT EXTRACTION PHACO AND INTRAOCULAR LENS PLACEMENT ([facility]);  Surgeon: Birder Robson, MD;  Location: [facility] ORS;  Service: Ophthalmology;  Laterality: Right;  [place] 01:09.5 AP% 15.8 CDE 11.04 Fluid Pack lot # [id number]   CHOLECYSTECTOMY     COLON SURGERY     BOWEL OBSTRUCTION   COLONOSCOPY WITH PROPOFOL N/A 04/25/2016   Procedure: COLONOSCOPY WITH PROPOFOL;  Surgeon: Jonathon Bellows, MD;  Location: [facility] ENDOSCOPY;  Service: Endoscopy;  Laterality: N/A;   ESOPHAGOGASTRODUODENOSCOPY (EGD) WITH PROPOFOL N/A 04/25/2016   Procedure: ESOPHAGOGASTRODUODENOSCOPY (EGD) WITH PROPOFOL;  Surgeon: Jonathon Bellows, MD;  Location: [facility] ENDOSCOPY;  Service: Endoscopy;  Laterality: N/A;   EXPLORATORY LAPAROTOMY  10/18/2011   blockage small intestines   GIVENS CAPSULE STUDY N/A 05/11/2016   Procedure: GIVENS CAPSULE STUDY;  Surgeon: Jonathon Bellows, MD;  Location: [facility] ENDOSCOPY;  Service: Endoscopy;  Laterality: N/A;   HPI:  Pt is an 87 y.o. female with a PMHx of skin cancer, HTN, GERD, hiatal hernia, HLD, iron deficiency anemia, CKD, depression, dysrhythmia, HTN and hypothyroidism who presented to the ED this morning from her Ophthalmologist's office for stroke work up after a BRAO was seen on exam there. The patient had been experiencing headaches  and a black spot blocking the vision in her right eye "that migrates around the eye". She went to Dr. George Ina, her Ophthalmologist, who diagnosed her with a right BRAO.  MRI brain without contrast: Several punctate acute infarcts are seen as follows: A cluster of punctate infarcts in the right aspect of the body of the corpus callosum and individual punctate infarcts in  the right frontal cortex, right parietal cortex, right occipital cortex and white matter. No associated hemorrhage or mass effect.   Assessment / Plan / Recommendation Clinical Impression   Patient seen for informal assessment of communication skills/abilities this morning. Family present in room. Patient awake, verbal and verbally engaged w/ this slp and family in room to make wants/needs known. Soft voice; min HOH at baseline.   Patient appears to present w/ functional cognitive communication skills and abilities w/ no overt, gross impairments during informal bedside assessment. Pt exhibited adequate attention to conversation/task, recall of morning information re: her meal/visitors, and some problem-solving strategies re: eating sitting in a chair and avoiding straws since she does not use them at home. Speech fluent with intact naming and comprehension. Able to follow general commands without difficulty. No overt expressive/receptive language deficits noted; no motor speech deficits noted.  Reviewed her notes w/ other disciplines noting appropriate engagement and follow-through during tasks.  Recommend potential check-in/follow up post discharge home to known environment performing her ADLs as needed. If any deficit/change from baseline noted, recommend f/u w/ PCP for referral to home health/outpatient speech services for full, objective evaluation of cognitive-linguistic skills.  Discussed general aspiration precautions and diet/food prep/recommendations w/ pt and family in setting of pt's GERD and Hiatal hernia; pt stated agreement w/ general precautions including sitting in chair or EOB for meals given support as needed. Pt in agreement w/ above and POC.       SLP Assessment  SLP Recommendation/Assessment: All further Speech Lanaguage Pathology  needs can be addressed in the next venue of care (if any noted upon return to her setting w/ ADLs) SLP Visit Diagnosis: Cognitive communication deficit  (R41.841)    Recommendations for follow up therapy are one component of a multi-disciplinary discharge planning process, led by the attending physician.  Recommendations may be updated based on patient status, additional functional criteria and insurance authorization.    Follow Up Recommendations  Follow physician's recommendations for discharge plan and follow up therapies    Assistance Recommended at Discharge  Set up Supervision/Assistance  Functional Status Assessment Patient has had a recent decline in their functional status and demonstrates the ability to make significant improvements in function in a reasonable and predictable amount of time.  Frequency and Duration  (n/a)   (n/a)      SLP Evaluation Cognition  Overall Cognitive Status: Within Functional Limits for tasks assessed Arousal/Alertness: Awake/alert Orientation Level: Oriented X4 Attention: Focused Focused Attention: Appears intact Memory: Appears intact (to breakfast meal items from earlier) Awareness: Appears intact Problem Solving: Appears intact Executive Function: Reasoning Reasoning: Appears intact (functional) Behaviors:  (no overt) Safety/Judgment: Appears intact (grossly) Comments: "I know it is best that I sit up in a chair to eat my meals"       Comprehension  Auditory Comprehension Overall Auditory Comprehension: Appears within functional limits for tasks assessed Yes/No Questions: Within Functional Limits (few) Commands: Within Functional Limits (few) Conversation: Simple Other Conversation Comments: soft Environmental education officer Discrimination: Not tested Reading Comprehension Reading Status: Not tested    Expression Expression Primary Mode of Expression: Verbal  Verbal Expression Overall Verbal Expression: Appears within functional limits for tasks assessed Initiation: No impairment Level of Generative/Spontaneous Verbalization: Sentence Naming: No  impairment Pragmatics: No impairment Non-Verbal Means of Communication: Not applicable Other Verbal Expression Comments: soft voice Written Expression Dominant Hand: Right Written Expression: Not tested   Oral / Motor  Oral Motor/Sensory Function Overall Oral Motor/Sensory Function: Within functional limits Motor Speech Overall Motor Speech: Appears within functional limits for tasks assessed               Orinda Kenner, MS, CCC-SLP Speech Language Pathologist Rehab Services; [place] [phone] (ascom) Elanah Osmanovic 08/05/2022, 1:20 PM

## 2022-08-05 NOTE — Progress Notes (Signed)
*  PRELIMINARY RESULTS* Echocardiogram 2D Echocardiogram has been performed.  Tracie Fisher 08/05/2022, 1:27 PM

## 2022-08-05 NOTE — Care Management CC44 (Signed)
Condition Code 44 Documentation Completed  Patient Details  Name: TICARA CUBA MRN: [id number] Date of Birth: 02/26/1936   Condition Code 44 given:  Yes Patient signature on Condition Code 44 notice:  Yes Documentation of 2 MD's agreement:  Yes Code 44 added to claim:  Yes    Candie Chroman, LCSW 08/05/2022, 4:53 PM

## 2022-08-08 ENCOUNTER — Telehealth: Payer: Self-pay | Admitting: Nurse Practitioner

## 2022-08-08 NOTE — Telephone Encounter (Signed)
[place] given verbal orders per [employer]

## 2022-08-08 NOTE — Telephone Encounter (Signed)
Home Health Verbal Orders - Caller/Agency: [facility] Bells from [facility] Number:  [phone] Requesting PT Frequency: 1x9

## 2022-08-09 ENCOUNTER — Encounter: Payer: Self-pay | Admitting: Oncology

## 2022-08-12 ENCOUNTER — Encounter: Payer: Self-pay | Admitting: Nurse Practitioner

## 2022-08-12 ENCOUNTER — Telehealth: Payer: Self-pay | Admitting: Nurse Practitioner

## 2022-08-12 ENCOUNTER — Ambulatory Visit (INDEPENDENT_AMBULATORY_CARE_PROVIDER_SITE_OTHER): Payer: 59 | Admitting: Nurse Practitioner

## 2022-08-12 VITALS — BP 100/56 | HR 58 | Temp 97.6°F | Ht 60.98 in | Wt 155.7 lb

## 2022-08-12 DIAGNOSIS — D692 Other nonthrombocytopenic purpura: Secondary | ICD-10-CM | POA: Diagnosis not present

## 2022-08-12 DIAGNOSIS — I272 Pulmonary hypertension, unspecified: Secondary | ICD-10-CM | POA: Diagnosis not present

## 2022-08-12 DIAGNOSIS — N1832 Chronic kidney disease, stage 3b: Secondary | ICD-10-CM

## 2022-08-12 DIAGNOSIS — E782 Mixed hyperlipidemia: Secondary | ICD-10-CM

## 2022-08-12 DIAGNOSIS — I6310 Cerebral infarction due to embolism of unspecified precerebral artery: Secondary | ICD-10-CM | POA: Diagnosis not present

## 2022-08-12 DIAGNOSIS — R413 Other amnesia: Secondary | ICD-10-CM

## 2022-08-12 DIAGNOSIS — I10 Essential (primary) hypertension: Secondary | ICD-10-CM

## 2022-08-12 DIAGNOSIS — E039 Hypothyroidism, unspecified: Secondary | ICD-10-CM

## 2022-08-12 NOTE — Progress Notes (Signed)
BP (!) 100/56 (BP Location: Left Arm, Patient Position: Sitting, Cuff Size: Normal)   Pulse (!) 58   Temp 97.6 F (36.4 C) (Oral)   Ht 5' 0.98" (1.549 m)   Wt 155 lb 11.2 oz (70.6 kg)   BMI 29.43 kg/m    Subjective:    Patient ID: DENEA GRAFTON, female    DOB: 05/15/1936, 87 y.o.   MRN: [id number]  HPI: RHYANN SENAT is a 87 y.o. female  Chief Complaint  Patient presents with   Hospitalization Follow-up    PT had Stroke.    [facility] Follow up.  Went into ER on 08/04/22 due to black spot in her vision on right eye, was seen by ophthalmologist and they told her to go to ER due to concern for retinal artery occlusion. She is followed by neurology currently for neuropathy and will be following up with Dr. Melrose Nakayama for her recent stroke -- scheduled 09/05/22.  Will also be seeing cardiology at [facility] on 09/01/22.  Per her imaging she was noted on MRI to have acute infarcts in the right aspect of the body of the corpus callosum, right frontal cortex, right parietal cortex, and right occipital cortex and white matter.  Continues to live on her own in small apartment at [place].  No recent falls.  Her son comes every Sunday and stays with her.  Sisters check on her every day.  Reports she does have an emergency button in place at her house if needed.  Continues to have mild black cloud to right eye, returns to eye doctor upcoming.  She is scheduled to have physical and occupational therapy in home.  "Hospital Course: JANAIS DEGLER is a 87 y.o. female with medical history significant of hypertension, hyperlipidemia, stroke, hypothyroidism, depression with anxiety, CKD-3B, anemia, varicose vein, chronic right-sided headache, who presents with right vision loss.  MRI brain showed:  Acute infarcts in the right aspect of the body of the corpus callosum, right frontal cortex, right parietal cortex, and right occipital cortex and white matter. Patient is seen by  neurology, not able to start aspirin due to allergy, continue Plavix and atorvastatin. Echo showed EF Q000111Q, grade 1 diastolic dysfunction, mild pulmonary hypertension, no source of thrombosis observed. Zio is placed.     Assessment and Plan: Stroke [facility]): MRI showed Acute infarcts in the right aspect of the body of the corpus callosum, right frontal cortex, right parietal cortex, and right occipital cortex and white matter, no retinal artery occlusion.   Patient is seen by neurology, recommended Plavix in addition to statin.  Patient is allergic to aspirin.  Echocardiogram performed, no source of thrombosis revealed.  ZIO has been placed, patient will follow-up with cardiology in 1 month to determine on anticoagulation.   Essential hypertension Resume home medicines.   HLD (hyperlipidemia) Pravastatin   Hypothyroidism, adult Synthroid   Stage 3b chronic kidney disease (CKD) ([place]):  With PCP as outpatient. Iron deficiency anemia due to chronic blood loss:    Stable.   Headache: Patient reports chronic right headache, etiology is not clear. Follow-up with PCP as outpatient.   Depression with anxiety -Continue home medications"  Hospital/Facility: [facility] D/C Physician: Dr. Roosevelt Locks D/C Date: 08/05/22  Records Requested: 08/12/22 Records Received: 08/12/22 Records Reviewed: 08/12/22  Diagnoses on Discharge: Stroke  Date of interactive Contact within 48 hours of discharge:  Contact was through: none  Date of 7 day or 14 day face-to-face visit:    within  7 days  Outpatient Encounter Medications as of 08/12/2022  Medication Sig   ALPRAZolam (XANAX) 1 MG tablet Take 0.5 mg (1/2 tablet) by mouth in the morning and 1 mg (1 tablet) by mouth at bedtime.   clopidogrel (PLAVIX) 75 MG tablet Take by mouth.   cyanocobalamin (V-R VITAMIN B-12) 500 MCG tablet Take 500 mcg by mouth daily.    ferrous gluconate (FERGON) 240 (27 FE) MG tablet Take 240 mg by mouth 2 (two) times daily.    furosemide (LASIX) 20 MG tablet Take 20 mg by mouth 2 (two) times daily.   levothyroxine (SYNTHROID) 50 MCG tablet Take 1 tablet (50 mcg total) by mouth daily before breakfast.   losartan (COZAAR) 50 MG tablet Take 50 mg daily by mouth.   metoprolol tartrate (LOPRESSOR) 25 MG tablet Take 25 mg by mouth 2 (two) times daily.   pravastatin (PRAVACHOL) 20 MG tablet Take by mouth.   pregabalin (LYRICA) 75 MG capsule Take 75 mg by mouth 3 (three) times daily.   Probiotic Product (PROBIOTIC DAILY PO) Take 1 capsule by mouth daily as needed.   [DISCONTINUED] spironolactone (ALDACTONE) 25 MG tablet Take 1 tablet by mouth daily.   No facility-administered encounter medications on file as of 08/12/2022.   Diagnostic Tests Reviewed/Disposition: Reviewed on chart both imaging and labs.  Consults:  Discharge Instructions: Follow-up with PCP in 1 week. Follow-up with cardiology in 1 month. Follow-up with neurology in 1 month.  Disease/illness Education: Reviewed with patient in office today  Home Health/Community Services Discussions/Referrals: Scheduled upcoming to have PT and OT  Establishment or re-establishment of referral orders for community resources: None  Discussion with other health care providers: Reviewed all recent notes in chart  Assessment and Support of treatment regimen adherence: Reviewed with patient in office today  Appointments Coordinated with: Reviewed with patient in office today  Education for self-management, independent living, and ADLs:  Reviewed with patient in office today  INSOMNIA Has been on Xanax for many years, Dr. Hall Busing her previous PCP started years ago. She wishes to remain on it -- takes 1/2 tablet Xanax in morning and 1 tablet at night.  Pt is aware of risks of psychoactive medication use to include increased sedation, respiratory suppression, falls, extrapyramidal movements,  dependence and cardiovascular events.  Pt would like to continue treatment as  benefit determined to outweigh risk.  Last filled on 08/10/22. Duration: chronic Satisfied with sleep quality: yes Difficulty falling asleep: yes Difficulty staying asleep: no Waking a few hours after sleep onset: no Early morning awakenings: no Daytime hypersomnolence: no Wakes feeling refreshed: no Good sleep hygiene: yes Apnea: no Snoring: no Depressed/anxious mood: no Recent stress: no Restless legs/nocturnal leg cramps: no Chronic pain/arthritis: yes History of sleep study: no Treatments attempted:  Xanax and benadryl     HYPERTENSION / HYPERLIPIDEMIA Taking Metoprolol, Losartan, Lasix, Plavix, Spironolactone, and Pravastatin. Moderate pulmonary hypertension. Follows with cardiology, last saw Dr. Ubaldo Glassing 12/14/22, last echo March 2024 Q000111Q, Grade diastolic I dysfunction. There is mildly elevated pulmonary artery systolic pressure.  Mild MR.   History of stroke years ago and then recent acute stroke on 08/04/22 as noted above. Satisfied with current treatment? yes Duration of hypertension: chronic BP monitoring frequency: rarely BP range:  BP medication side effects: no Duration of hyperlipidemia: chronic Cholesterol medication side effects: no Cholesterol supplements: none Medication compliance: good compliance Aspirin: no Recent stressors: no Recurrent headaches: no Visual changes: no Palpitations: no Dyspnea: no Chest pain: no Lower extremity edema: no  Dizzy/lightheaded: no     07/15/2022   11:31 AM  6CIT Screen  What Year? 0 points  What month? 0 points  What time? 0 points  Count back from 20 0 points  Months in reverse 4 points  Repeat phrase 8 points  Total Score 12 points   Relevant past medical, surgical, family and social history reviewed and updated as indicated. Interim medical history since our last visit reviewed. Allergies and medications reviewed and updated.  Review of Systems  Constitutional:  Negative for activity change, appetite change,  chills, fatigue and fever.  Respiratory:  Negative for cough, chest tightness, shortness of breath and wheezing.   Cardiovascular:  Negative for chest pain, palpitations and leg swelling.  Gastrointestinal: Negative.   Endocrine: Negative for cold intolerance and heat intolerance.  Neurological:  Positive for weakness. Negative for dizziness, tremors, syncope, speech difficulty, light-headedness, numbness and headaches.  Psychiatric/Behavioral:  Negative for decreased concentration, self-injury, sleep disturbance and suicidal ideas. The patient is not nervous/anxious.     Per HPI unless specifically indicated above     Objective:    BP (!) 100/56 (BP Location: Left Arm, Patient Position: Sitting, Cuff Size: Normal)   Pulse (!) 58   Temp 97.6 F (36.4 C) (Oral)   Ht 5' 0.98" (1.549 m)   Wt 155 lb 11.2 oz (70.6 kg)   BMI 29.43 kg/m   Wt Readings from Last 3 Encounters:  08/12/22 155 lb 11.2 oz (70.6 kg)  08/04/22 153 lb (69.4 kg)  07/15/22 154 lb 12.8 oz (70.2 kg)    Physical Exam Vitals and nursing note reviewed.  Constitutional:      General: She is awake. She is not in acute distress.    Appearance: She is well-developed and well-groomed. She is not ill-appearing or toxic-appearing.  HENT:     Head: Normocephalic.     Right Ear: Hearing and external ear normal.     Left Ear: Hearing and external ear normal.  Eyes:     General: Lids are normal.        Right eye: No discharge.        Left eye: No discharge.     Conjunctiva/sclera: Conjunctivae normal.     Pupils: Pupils are equal, round, and reactive to light.  Neck:     Thyroid: No thyromegaly.     Vascular: No carotid bruit or JVD.  Cardiovascular:     Rate and Rhythm: Normal rate and regular rhythm.     Heart sounds: Normal heart sounds. No murmur heard.    No gallop.  Pulmonary:     Effort: Pulmonary effort is normal.     Breath sounds: Normal breath sounds.  Abdominal:     General: Bowel sounds are normal.  There is no distension.     Palpations: Abdomen is soft.     Tenderness: There is no abdominal tenderness.  Musculoskeletal:     Cervical back: Normal range of motion and neck supple.     Right lower leg: No edema.     Left lower leg: No edema.  Lymphadenopathy:     Cervical: No cervical adenopathy.  Skin:    General: Skin is warm and dry.  Neurological:     Mental Status: She is alert.     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    Psychiatric:        Attention and Perception: Attention normal.        Mood and Affect:  Mood normal.        Speech: Speech normal.        Behavior: Behavior normal. Behavior is cooperative.        Thought Content: Thought content normal.    Results for orders placed or performed during the hospital encounter of 08/04/22  Protime-INR  Result Value Ref Range   Prothrombin Time 13.1 11.4 - 15.2 seconds   INR 1.0 0.8 - 1.2  APTT  Result Value Ref Range   aPTT 29 24 - 36 seconds  CBC  Result Value Ref Range   WBC 7.0 4.0 - 10.5 K/uL   RBC 3.73 (L) 3.87 - 5.11 MIL/uL   Hemoglobin 12.0 12.0 - 15.0 g/dL   HCT 37.6 36.0 - 46.0 %   MCV 100.8 (H) 80.0 - 100.0 fL   MCH 32.2 26.0 - 34.0 pg   MCHC 31.9 30.0 - 36.0 g/dL   RDW 13.7 11.5 - 15.5 %   Platelets 300 150 - 400 K/uL   nRBC 0.0 0.0 - 0.2 %  Differential  Result Value Ref Range   Neutrophils Relative % 59 %   Neutro Abs 4.1 1.7 - 7.7 K/uL   Lymphocytes Relative 23 %   Lymphs Abs 1.6 0.7 - 4.0 K/uL   Monocytes Relative 13 %   Monocytes Absolute 0.9 0.1 - 1.0 K/uL   Eosinophils Relative 4 %   Eosinophils Absolute 0.3 0.0 - 0.5 K/uL   Basophils Relative 1 %   Basophils Absolute 0.1 0.0 - 0.1 K/uL   Immature Granulocytes 0 %   Abs Immature Granulocytes 0.03 0.00 - 0.07 K/uL  Comprehensive metabolic panel  Result Value Ref Range   Sodium 137 135 - 145 mmol/L   Potassium 4.5 3.5 - 5.1 mmol/L   Chloride 102 98 - 111 mmol/L   CO2 25 22 - 32 mmol/L   Glucose, Bld 73 70  - 99 mg/dL   BUN 51 (H) 8 - 23 mg/dL   Creatinine, Ser 1.70 (H) 0.44 - 1.00 mg/dL   Calcium 9.3 8.9 - 10.3 mg/dL   Total Protein 7.3 6.5 - 8.1 g/dL   Albumin 4.0 3.5 - 5.0 g/dL   AST 25 15 - 41 U/L   ALT 14 0 - 44 U/L   Alkaline Phosphatase 67 38 - 126 U/L   Total Bilirubin 0.6 0.3 - 1.2 mg/dL   GFR, Estimated 29 (L) >60 mL/min   Anion gap 10 5 - 15  Urine Drug Screen, Qualitative  Result Value Ref Range   Tricyclic, Ur Screen NONE DETECTED NONE DETECTED   Amphetamines, Ur Screen NONE DETECTED NONE DETECTED   MDMA (Ecstasy)Ur Screen NONE DETECTED NONE DETECTED   Cocaine Metabolite,Ur [place] NONE DETECTED NONE DETECTED   Opiate, Ur Screen NONE DETECTED NONE DETECTED   Phencyclidine (PCP) Ur S NONE DETECTED NONE DETECTED   Cannabinoid 50 Ng, Ur [place] NONE DETECTED NONE DETECTED   Barbiturates, Ur Screen NONE DETECTED NONE DETECTED   Benzodiazepine, Ur Scrn POSITIVE (A) NONE DETECTED   Methadone Scn, Ur NONE DETECTED NONE DETECTED  Urinalysis, Routine w reflex microscopic -Urine, Clean Catch  Result Value Ref Range   Color, Urine YELLOW YELLOW   APPearance CLEAR CLEAR   Specific Gravity, Urine <1.005 (L) 1.005 - 1.030   pH 5.5 5.0 - 8.0   Glucose, UA NEGATIVE NEGATIVE mg/dL   Hgb urine dipstick NEGATIVE NEGATIVE   Bilirubin Urine NEGATIVE NEGATIVE   Ketones, ur NEGATIVE NEGATIVE mg/dL   Protein, ur NEGATIVE  NEGATIVE mg/dL   Nitrite NEGATIVE NEGATIVE   Leukocytes,Ua NEGATIVE NEGATIVE  Ethanol  Result Value Ref Range   Alcohol, Ethyl (B) <10 <10 mg/dL  Hemoglobin A1c  Result Value Ref Range   Hgb A1c MFr Bld 5.6 4.8 - 5.6 %   Mean Plasma Glucose 114 mg/dL  Lipid panel  Result Value Ref Range   Cholesterol 169 0 - 200 mg/dL   Triglycerides 131 <150 mg/dL   HDL 53 >40 mg/dL   Total CHOL/HDL Ratio 3.2 RATIO   VLDL 26 0 - 40 mg/dL   LDL Cholesterol 90 0 - 99 mg/dL  Basic metabolic panel  Result Value Ref Range   Sodium 140 135 - 145 mmol/L   Potassium 4.4 3.5 - 5.1 mmol/L    Chloride 100 98 - 111 mmol/L   CO2 23 22 - 32 mmol/L   Glucose, Bld 80 70 - 99 mg/dL   BUN 44 (H) 8 - 23 mg/dL   Creatinine, Ser 1.57 (H) 0.44 - 1.00 mg/dL   Calcium 9.4 8.9 - 10.3 mg/dL   GFR, Estimated 32 (L) >60 mL/min   Anion gap 17 (H) 5 - 15  Sedimentation rate  Result Value Ref Range   Sed Rate 29 0 - 30 mm/hr  C-reactive protein  Result Value Ref Range   CRP <0.5 <1.0 mg/dL  CBG monitoring, ED  Result Value Ref Range   Glucose-Capillary 63 (L) 70 - 99 mg/dL  ECHOCARDIOGRAM COMPLETE  Result Value Ref Range   Weight 2,448 oz   Height 61 in   BP 104/69 mmHg   Ao pk vel 1.51 m/s   AV Area VTI 2.16 cm2   AR max vel 2.34 cm2   AV Mean grad 4.0 mmHg   AV Peak grad 9.1 mmHg   S' Lateral 2.50 cm   AV Area mean vel 1.92 cm2   Area-P 1/2 2.72 cm2   MV VTI 2.78 cm2   Est EF 65 - 70%       Assessment & Plan:   Problem List Items Addressed This Visit       Cardiovascular and Mediastinum   Essential hypertension    Chronic, ongoing.  BP on lower side today for age and concern for falls with this.  Recommend she monitor BP at least a few mornings a week at home and document.  DASH diet at home.  Recommend we stop Spironolactone at this time and continue Lasix, Metoprolol, and Losartan.  Collaboration with cardiology - recent note reviewed.  Labs today: CBC and CMP.  Urine ALB 25 June 2022.  Plan for return in 2 weeks to recheck BP.       Moderate pulmonary hypertension (HCC)    Chronic, ongoing.  Followed by cardiology, continue current medication regimen as prescribed by them with exception of Spironolactone, refer to HTN plan of care.  Recent notes reviewed.      Senile purpura (HCC)    Noted to upper and lower extremities.  Recommended to patient gentle cleansing daily with soap and water + application of lotion.  Monitor closely for skin breakdown or wounds, immediately notify provider if present.      Relevant Orders   CBC with Differential/Platelet   Stroke  [facility] - Primary    History of CVA with recent event 08/04/22.  She is followed by neurology, continue this collaboration.  Recommend continue current medication regimen + collaboration with ophthalmology too.  Obtain labs today.  Endocrine   Hypothyroidism, adult    Chronic, ongoing.  Continue current medication regimen and adjust as needed based on labs.  TSH, Free T4 today.      Relevant Orders   TSH   T4, free     Genitourinary   Stage 3b chronic kidney disease (CKD) (HCC)    Chronic, ongoing.  Currently CKD 3b.  Educated her on diet and fluid intake + current medications.  Recheck labs today.  Urin ALB 25 June 2022.        Other   Loss of memory    Noted on past diagnoses, 6CIT 12 beginning of March, difficulty with recall.  No current memory medications, may benefit from this in future.  Continue collaboration with neurology.  Will get CCM involvement.  May benefit imaging in future.  Have requested she bring a family member or friend to visit, but she reports they all work and are unable to attend.  This may be beneficial to have at visits though to help her with recall.      Mixed hyperlipidemia    Chronic, ongoing.  Continue current medication regimen and adjust as needed.  Labs up to date.      Relevant Orders   Comprehensive metabolic panel     Follow up plan: Return in about 2 weeks (around 08/26/2022) for HTN -- recheck with stopping Spironolactone.

## 2022-08-12 NOTE — Assessment & Plan Note (Signed)
History of CVA with recent event 08/04/22.  She is followed by neurology, continue this collaboration.  Recommend continue current medication regimen + collaboration with ophthalmology too.  Obtain labs today.

## 2022-08-12 NOTE — Assessment & Plan Note (Signed)
Noted on past diagnoses, 6CIT 12 beginning of March, difficulty with recall.  No current memory medications, may benefit from this in future.  Continue collaboration with neurology.  Will get CCM involvement.  May benefit imaging in future.  Have requested she bring a family member or friend to visit, but she reports they all work and are unable to attend.  This may be beneficial to have at visits though to help her with recall.

## 2022-08-12 NOTE — Assessment & Plan Note (Addendum)
Chronic, ongoing.  Followed by cardiology, continue current medication regimen as prescribed by them with exception of Spironolactone, refer to HTN plan of care.  Recent notes reviewed.

## 2022-08-12 NOTE — Assessment & Plan Note (Signed)
Noted to upper and lower extremities.  Recommended to patient gentle cleansing daily with soap and water + application of lotion.  Monitor closely for skin breakdown or wounds, immediately notify provider if present. 

## 2022-08-12 NOTE — Assessment & Plan Note (Signed)
Chronic, ongoing.  Continue current medication regimen and adjust as needed based on labs.  TSH, Free T4 today.

## 2022-08-12 NOTE — Assessment & Plan Note (Signed)
Chronic, ongoing.  BP on lower side today for age and concern for falls with this.  Recommend she monitor BP at least a few mornings a week at home and document.  DASH diet at home.  Recommend we stop Spironolactone at this time and continue Lasix, Metoprolol, and Losartan.  Collaboration with cardiology - recent note reviewed.  Labs today: CBC and CMP.  Urine ALB 25 June 2022.  Plan for return in 2 weeks to recheck BP.

## 2022-08-12 NOTE — Assessment & Plan Note (Signed)
Chronic, ongoing.  Continue current medication regimen and adjust as needed.  Labs up to date. 

## 2022-08-12 NOTE — Telephone Encounter (Signed)
Verbal orders given per Jolene 

## 2022-08-12 NOTE — Assessment & Plan Note (Signed)
Chronic, ongoing.  Currently CKD 3b.  Educated her on diet and fluid intake + current medications.  Recheck labs today.  Urin ALB 25 June 2022.

## 2022-08-12 NOTE — Patient Instructions (Signed)
Stroke Prevention Some medical conditions and lifestyle choices can lead to a higher risk for a stroke. You can help to prevent a stroke by eating healthy foods and exercising. It also helps to not smoke and to manage any health problems you may have. How can this condition affect me? A stroke is an emergency. It should be treated right away. A stroke can lead to brain damage or threaten your life. There is a better chance of surviving and getting better after a stroke if you get medical help right away. What can increase my risk? The following medical conditions may increase your risk of a stroke: Diseases of the heart and blood vessels (cardiovascular disease). High blood pressure (hypertension). Diabetes. High cholesterol. Sickle cell disease. Problems with blood clotting. Being very overweight. Sleeping problems (obstructivesleep apnea). Other risk factors include: Being older than age 60. A history of blood clots, stroke, or mini-stroke (TIA). Race, ethnic background, or a family history of stroke. Smoking or using tobacco products. Taking birth control pills, especially if you smoke. Heavy alcohol and drug use. Not being active. What actions can I take to prevent this? Manage your health conditions High cholesterol. Eat a healthy diet. If this is not enough to manage your cholesterol, you may need to take medicines. Take medicines as told by your doctor. High blood pressure. Try to keep your blood pressure below 130/80. If your blood pressure cannot be managed through a healthy diet and regular exercise, you may need to take medicines. Take medicines as told by your doctor. Ask your doctor if you should check your blood pressure at home. Have your blood pressure checked every year. Diabetes. Eat a healthy diet and get regular exercise. If your blood sugar (glucose) cannot be managed through diet and exercise, you may need to take medicines. Take medicines as told by your  doctor. Talk to your doctor about getting checked for sleeping problems. Signs of a problem can include: Snoring a lot. Feeling very tired. Make sure that you manage any other conditions you have. Nutrition  Follow instructions from your doctor about what to eat or drink. You may be told to: Eat and drink fewer calories each day. Limit how much salt (sodium) you use to 1,500 milligrams (mg) each day. Use only healthy fats for cooking, such as olive oil, canola oil, and sunflower oil. Eat healthy foods. To do this: Choose foods that are high in fiber. These include whole grains, and fresh fruits and vegetables. Eat at least 5 servings of fruits and vegetables a day. Try to fill one-half of your plate with fruits and vegetables at each meal. Choose low-fat (lean) proteins. These include low-fat cuts of meat, chicken without skin, fish, tofu, beans, and nuts. Eat low-fat dairy products. Avoid foods that: Are high in salt. Have saturated fat. Have trans fat. Have cholesterol. Are processed or pre-made. Count how many carbohydrates you eat and drink each day. Lifestyle If you drink alcohol: Limit how much you have to: 0-1 drink a day for women who are not pregnant. 0-2 drinks a day for men. Know how much alcohol is in your drink. In the [place] one drink equals one 12 oz bottle of beer (355mL), one 5 oz glass of wine (148mL), or one 1 oz glass of hard liquor (44mL). Do not smoke or use any products that have nicotine or tobacco. If you need help quitting, ask your doctor. Avoid secondhand smoke. Do not use drugs. Activity  Try to stay at a   healthy weight. Get at least 30 minutes of exercise on most days, such as: Fast walking. Biking. Swimming. Medicines Take over-the-counter and prescription medicines only as told by your doctor. Avoid taking birth control pills. Talk to your doctor about the risks of taking birth control pills if: You are over 35 years old. You smoke. You get  very bad headaches. You have had a blood clot. Where to find more information American Stroke Association: www.strokeassociation.org Get help right away if: You or a loved one has any signs of a stroke. "BE FAST" is an easy way to remember the warning signs: B - Balance. Dizziness, sudden trouble walking, or loss of balance. E - Eyes. Trouble seeing or a change in how you see. F - Face. Sudden weakness or loss of feeling of the face. The face or eyelid may droop on one side. A - Arms. Weakness or loss of feeling in an arm. This happens all of a sudden and most often on one side of the body. S - Speech. Sudden trouble speaking, slurred speech, or trouble understanding what people say. T - Time. Time to call emergency services. Write down what time symptoms started. You or a loved one has other signs of a stroke, such as: A sudden, very bad headache with no known cause. Feeling like you may vomit (nausea). Vomiting. A seizure. These symptoms may be an emergency. Get help right away. Call your local emergency services (911 in the [place] Do not wait to see if the symptoms will go away. Do not drive yourself to the hospital. Summary You can help to prevent a stroke by eating healthy, exercising, and not smoking. It also helps to manage any health problems you have. Do not smoke or use any products that contain nicotine or tobacco. Get help right away if you or a loved one has any signs of a stroke. This information is not intended to replace advice given to you by your health care provider. Make sure you discuss any questions you have with your health care provider. Document Revised: 11/14/2019 Document Reviewed: 12/02/2019 Elsevier Patient Education  2023 Elsevier Inc.  

## 2022-08-12 NOTE — Telephone Encounter (Signed)
Home Health Verbal Orders - Caller/Agency: [facility] Number: [phone] Requesting OT/PT/Skilled Nursing/Social Work/Speech Therapy: OT Frequency:   1w3

## 2022-08-13 ENCOUNTER — Other Ambulatory Visit: Payer: Self-pay | Admitting: Nurse Practitioner

## 2022-08-13 DIAGNOSIS — N184 Chronic kidney disease, stage 4 (severe): Secondary | ICD-10-CM

## 2022-08-13 LAB — CBC WITH DIFFERENTIAL/PLATELET
Basophils Absolute: 0 10*3/uL (ref 0.0–0.2)
Basos: 1 %
EOS (ABSOLUTE): 0.3 10*3/uL (ref 0.0–0.4)
Eos: 4 %
Hematocrit: 34.4 % (ref 34.0–46.6)
Hemoglobin: 10.8 g/dL — ABNORMAL LOW (ref 11.1–15.9)
Immature Grans (Abs): 0 10*3/uL (ref 0.0–0.1)
Immature Granulocytes: 0 %
Lymphocytes Absolute: 1.7 10*3/uL (ref 0.7–3.1)
Lymphs: 28 %
MCH: 30.6 pg (ref 26.6–33.0)
MCHC: 31.4 g/dL — ABNORMAL LOW (ref 31.5–35.7)
MCV: 98 fL — ABNORMAL HIGH (ref 79–97)
Monocytes Absolute: 0.8 10*3/uL (ref 0.1–0.9)
Monocytes: 12 %
Neutrophils Absolute: 3.4 10*3/uL (ref 1.4–7.0)
Neutrophils: 55 %
Platelets: 231 10*3/uL (ref 150–450)
RBC: 3.53 x10E6/uL — ABNORMAL LOW (ref 3.77–5.28)
RDW: 12.9 % (ref 11.7–15.4)
WBC: 6.2 10*3/uL (ref 3.4–10.8)

## 2022-08-13 LAB — TSH: TSH: 1.05 u[IU]/mL (ref 0.450–4.500)

## 2022-08-13 LAB — COMPREHENSIVE METABOLIC PANEL
ALT: 14 IU/L (ref 0–32)
AST: 21 IU/L (ref 0–40)
Albumin/Globulin Ratio: 2.3 — ABNORMAL HIGH (ref 1.2–2.2)
Albumin: 4.1 g/dL (ref 3.7–4.7)
Alkaline Phosphatase: 76 IU/L (ref 44–121)
BUN/Creatinine Ratio: 27 (ref 12–28)
BUN: 48 mg/dL — ABNORMAL HIGH (ref 8–27)
Bilirubin Total: 0.3 mg/dL (ref 0.0–1.2)
CO2: 21 mmol/L (ref 20–29)
Calcium: 9.2 mg/dL (ref 8.7–10.3)
Chloride: 99 mmol/L (ref 96–106)
Creatinine, Ser: 1.79 mg/dL — ABNORMAL HIGH (ref 0.57–1.00)
Globulin, Total: 1.8 g/dL (ref 1.5–4.5)
Glucose: 85 mg/dL (ref 70–99)
Potassium: 5 mmol/L (ref 3.5–5.2)
Sodium: 135 mmol/L (ref 134–144)
Total Protein: 5.9 g/dL — ABNORMAL LOW (ref 6.0–8.5)
eGFR: 27 mL/min/{1.73_m2} — ABNORMAL LOW (ref >59)

## 2022-08-13 LAB — T4, FREE: Free T4: 1.45 ng/dL (ref 0.82–1.77)

## 2022-08-13 NOTE — Progress Notes (Signed)
Good morning, please let Kandi know her labs have returned: - CBC is showing some mild anemia and kidney function continues to show stage 4 kidney disease.  I am placing a referral to the kidney doctor to assess further and offer any recommendations to help keep kidneys happy.  They will call you to schedule a visit, if you do not hear from them over the next week let us know. - Thyroid labs are normal.  Continue current Levothyroxine dosing.  Any questions? Keep being amazing!!  Thank you for allowing me to participate in your care.  I appreciate you. Kindest regards, Vada Swift

## 2022-08-14 DIAGNOSIS — J449 Chronic obstructive pulmonary disease, unspecified: Secondary | ICD-10-CM

## 2022-08-14 DIAGNOSIS — I10 Essential (primary) hypertension: Secondary | ICD-10-CM

## 2022-08-21 NOTE — Patient Instructions (Addendum)
Kidney provider is at [address] [place], [place] [address] ([address]) -- you are scheduled May 13th, 2024 at 2:20 pm  DASH Eating Plan DASH stands for Dietary Approaches to Stop Hypertension. The DASH eating plan is a healthy eating plan that has been shown to: Reduce high blood pressure (hypertension). Reduce your risk for type 2 diabetes, heart disease, and stroke. Help with weight loss. What are tips for following this plan? Reading food labels Check food labels for the amount of salt (sodium) per serving. Choose foods with less than 5 percent of the Daily Value of sodium. Generally, foods with less than 300 milligrams (mg) of sodium per serving fit into this eating plan. To find whole grains, look for the word "whole" as the first word in the ingredient list. Shopping Buy products labeled as "low-sodium" or "no salt added." Buy fresh foods. Avoid canned foods and pre-made or frozen meals. Cooking Avoid adding salt when cooking. Use salt-free seasonings or herbs instead of table salt or sea salt. Check with your health care provider or pharmacist before using salt substitutes. Do not fry foods. Cook foods using healthy methods such as baking, boiling, grilling, roasting, and broiling instead. Cook with heart-healthy oils, such as olive, canola, avocado, soybean, or sunflower oil. Meal planning  Eat a balanced diet that includes: 4 or more servings of fruits and 4 or more servings of vegetables each day. Try to fill one-half of your plate with fruits and vegetables. 6-8 servings of whole grains each day. Less than 6 oz (170 g) of lean meat, poultry, or fish each day. A 3-oz (85-g) serving of meat is about the same size as a deck of cards. One egg equals 1 oz (28 g). 2-3 servings of low-fat dairy each day. One serving is 1 cup (237 mL). 1 serving of nuts, seeds, or beans 5 times each week. 2-3 servings of heart-healthy fats. Healthy fats called omega-3 fatty acids are found in  foods such as walnuts, flaxseeds, fortified milks, and eggs. These fats are also found in cold-water fish, such as sardines, salmon, and mackerel. Limit how much you eat of: Canned or prepackaged foods. Food that is high in trans fat, such as some fried foods. Food that is high in saturated fat, such as fatty meat. Desserts and other sweets, sugary drinks, and other foods with added sugar. Full-fat dairy products. Do not salt foods before eating. Do not eat more than 4 egg yolks a week. Try to eat at least 2 vegetarian meals a week. Eat more home-cooked food and less restaurant, buffet, and fast food. Lifestyle When eating at a restaurant, ask that your food be prepared with less salt or no salt, if possible. If you drink alcohol: Limit how much you use to: 0-1 drink a day for women who are not pregnant. 0-2 drinks a day for men. Be aware of how much alcohol is in your drink. In the [place] one drink equals one 12 oz bottle of beer (355 mL), one 5 oz glass of wine (148 mL), or one 1 oz glass of hard liquor (44 mL). General information Avoid eating more than 2,300 mg of salt a day. If you have hypertension, you may need to reduce your sodium intake to 1,500 mg a day. Work with your health care provider to maintain a healthy body weight or to lose weight. Ask what an ideal weight is for you. Get at least 30 minutes of exercise that causes your heart to beat  faster (aerobic exercise) most days of the week. Activities may include walking, swimming, or biking. Work with your health care provider or dietitian to adjust your eating plan to your individual calorie needs. What foods should I eat? Fruits All fresh, dried, or frozen fruit. Canned fruit in natural juice (without added sugar). Vegetables Fresh or frozen vegetables (raw, steamed, roasted, or grilled). Low-sodium or reduced-sodium tomato and vegetable juice. Low-sodium or reduced-sodium tomato sauce and tomato paste. Low-sodium or  reduced-sodium canned vegetables. Grains Whole-grain or whole-wheat bread. Whole-grain or whole-wheat pasta. Brown rice. [place]. Bulgur. Whole-grain and low-sodium cereals. Pita bread. Low-fat, low-sodium crackers. Whole-wheat flour tortillas. Meats and other proteins Skinless chicken or [place]. Ground chicken or [place]. Pork with fat trimmed off. Fish and seafood. Egg whites. Dried beans, peas, or lentils. Unsalted nuts, nut butters, and seeds. Unsalted canned beans. Lean cuts of beef with fat trimmed off. Low-sodium, lean precooked or cured meat, such as sausages or meat loaves. Dairy Low-fat (1%) or fat-free (skim) milk. Reduced-fat, low-fat, or fat-free cheeses. Nonfat, low-sodium ricotta or cottage cheese. Low-fat or nonfat yogurt. Low-fat, low-sodium cheese. Fats and oils Soft margarine without trans fats. Vegetable oil. Reduced-fat, low-fat, or light mayonnaise and salad dressings (reduced-sodium). Canola, safflower, olive, avocado, soybean, and sunflower oils. Avocado. Seasonings and condiments Herbs. Spices. Seasoning mixes without salt. Other foods Unsalted popcorn and pretzels. Fat-free sweets. The items listed above may not be a complete list of foods and beverages you can eat. Contact a dietitian for more information. What foods should I avoid? Fruits Canned fruit in a light or heavy syrup. Fried fruit. Fruit in cream or butter sauce. Vegetables Creamed or fried vegetables. Vegetables in a cheese sauce. Regular canned vegetables (not low-sodium or reduced-sodium). Regular canned tomato sauce and paste (not low-sodium or reduced-sodium). Regular tomato and vegetable juice (not low-sodium or reduced-sodium). Angie Fava. Olives. Grains Baked goods made with fat, such as croissants, muffins, or some breads. Dry pasta or rice meal packs. Meats and other proteins Fatty cuts of meat. Ribs. Fried meat. Berniece Salines. Bologna, salami, and other precooked or cured meats, such as sausages or  meat loaves. Fat from the back of a pig (fatback). Bratwurst. Salted nuts and seeds. Canned beans with added salt. Canned or smoked fish. Whole eggs or egg yolks. Chicken or [place] with skin. Dairy Whole or 2% milk, cream, and half-and-half. Whole or full-fat cream cheese. Whole-fat or sweetened yogurt. Full-fat cheese. Nondairy creamers. Whipped toppings. Processed cheese and cheese spreads. Fats and oils Butter. Stick margarine. Lard. Shortening. Ghee. Bacon fat. Tropical oils, such as coconut, palm kernel, or palm oil. Seasonings and condiments Onion salt, garlic salt, seasoned salt, table salt, and sea salt. Worcestershire sauce. Tartar sauce. Barbecue sauce. Teriyaki sauce. Soy sauce, including reduced-sodium. Steak sauce. Canned and packaged gravies. Fish sauce. Oyster sauce. Cocktail sauce. Store-bought horseradish. Ketchup. Mustard. Meat flavorings and tenderizers. Bouillon cubes. Hot sauces. Pre-made or packaged marinades. Pre-made or packaged taco seasonings. Relishes. Regular salad dressings. Other foods Salted popcorn and pretzels. The items listed above may not be a complete list of foods and beverages you should avoid. Contact a dietitian for more information. Where to find more information National Heart, Lung, and Blood Institute: https://wilson-eaton.com/ American Heart Association: www.heart.org Academy of Nutrition and Dietetics: www.eatright.[place]: www.kidney.org Summary The DASH eating plan is a healthy eating plan that has been shown to reduce high blood pressure (hypertension). It may also reduce your risk for type 2 diabetes, heart disease, and stroke. When on  the DASH eating plan, aim to eat more fresh fruits and vegetables, whole grains, lean proteins, low-fat dairy, and heart-healthy fats. With the DASH eating plan, you should limit salt (sodium) intake to 2,300 mg a day. If you have hypertension, you may need to reduce your sodium intake to 1,500 mg a  day. Work with your health care provider or dietitian to adjust your eating plan to your individual calorie needs. This information is not intended to replace advice given to you by your health care provider. Make sure you discuss any questions you have with your health care provider. Document Revised: 04/05/2019 Document Reviewed: 04/05/2019 Elsevier Patient Education  [place].

## 2022-08-26 ENCOUNTER — Encounter: Payer: Self-pay | Admitting: Nurse Practitioner

## 2022-08-26 ENCOUNTER — Ambulatory Visit (INDEPENDENT_AMBULATORY_CARE_PROVIDER_SITE_OTHER): Payer: 59 | Admitting: Nurse Practitioner

## 2022-08-26 ENCOUNTER — Telehealth: Payer: Self-pay

## 2022-08-26 VITALS — BP 138/84 | HR 60 | Temp 97.6°F | Ht 60.98 in | Wt 158.5 lb

## 2022-08-26 DIAGNOSIS — R413 Other amnesia: Secondary | ICD-10-CM

## 2022-08-26 DIAGNOSIS — N184 Chronic kidney disease, stage 4 (severe): Secondary | ICD-10-CM | POA: Diagnosis not present

## 2022-08-26 DIAGNOSIS — Z8673 Personal history of transient ischemic attack (TIA), and cerebral infarction without residual deficits: Secondary | ICD-10-CM

## 2022-08-26 DIAGNOSIS — M79671 Pain in right foot: Secondary | ICD-10-CM | POA: Diagnosis not present

## 2022-08-26 DIAGNOSIS — M79672 Pain in left foot: Secondary | ICD-10-CM

## 2022-08-26 NOTE — Assessment & Plan Note (Signed)
Scheduled upcoming to see podiatry, will continue this collaboration.

## 2022-08-26 NOTE — Assessment & Plan Note (Signed)
Noted on past diagnoses, 6CIT 12 beginning of March, difficulty with recall.  No current memory medications, may benefit from this in future.  Continue collaboration with neurology.  CCM team involved.  May benefit imaging in future.  Have requested she bring a family member or friend to visit, but she reports they all work and are unable to attend.  This may be beneficial to have at visits though to help her with recall.

## 2022-08-26 NOTE — Assessment & Plan Note (Signed)
Chronic, ongoing.  Currently CKD 4.  Educated her on diet and fluid intake + current medications. She is scheduled to see nephrology upcoming for initial visit, which will be beneficial with current mild decline.  Urine ALB 25 June 2022.  Recommend continue all current medications and wear compression hose at home on during day and off at night.

## 2022-08-26 NOTE — Progress Notes (Signed)
BP 138/84   Pulse 60   Temp 97.6 F (36.4 C) (Oral)   Ht 5' 0.98" (1.549 m)   Wt 158 lb 8 oz (71.9 kg)   SpO2 99%   BMI 29.96 kg/m    Subjective:    Patient ID: Tracie Fisher, female    DOB: Aug 21, 1935, 87 y.o.   MRN: [id number]  HPI: Tracie Fisher is a 87 y.o. female  Chief Complaint  Patient presents with   Cerebrovascular Accident    Here for follow up    Foot Pain    Right heel pain patient states that she has an appt with Dr. Ether Griffins next week   HYPERTENSION & H/O CVA History of CVA on 08/04/22, she has PT and OT working with her at home.  Currently they come out once a week.  Follows with neurology, last saw 06/08/22.  Continues on Plavix, Lasix, Losartan, Metoprolol, Pravastatin, and Spironolactone.    Currently having some right heel pain, but is scheduled for injection with Dr. Ether Griffins next week. Hypertension status: stable  Satisfied with current treatment? yes Duration of hypertension: chronic BP monitoring frequency:  a few times a week BP range: 120-130/80 BP medication side effects:  no Medication compliance: good compliance Aspirin:  Plavix Recurrent headaches:  occasional due to her chronic neck pain Visual changes: no Palpitations: no Dyspnea: no Chest pain: no Lower extremity edema: yes, to both legs at baseline -- sits a lot, not wearing compression hose Dizzy/lightheaded: no     07/15/2022   11:31 AM  6CIT Screen  What Year? 0 points  What month? 0 points  What time? 0 points  Count back from 20 0 points  Months in reverse 4 points  Repeat phrase 8 points  Total Score 12 points   CHRONIC KIDNEY DISEASE Scheduled for initial nephrology visit on 09/26/22. CKD status: stable Medications renally dose: yes Previous renal evaluation: no Pneumovax:  Up to Date Influenza Vaccine:  Up to Date   Relevant past medical, surgical, family and social history reviewed and updated as indicated. Interim medical history since our last visit  reviewed. Allergies and medications reviewed and updated.  Review of Systems  Constitutional:  Negative for activity change, appetite change, diaphoresis, fatigue and fever.  Respiratory:  Negative for cough, chest tightness and shortness of breath.   Cardiovascular:  Positive for leg swelling. Negative for chest pain and palpitations.  Gastrointestinal: Negative.   Musculoskeletal:  Positive for arthralgias.  Neurological: Negative.   Psychiatric/Behavioral:  Negative for decreased concentration, self-injury, sleep disturbance and suicidal ideas.     Per HPI unless specifically indicated above     Objective:    BP 138/84   Pulse 60   Temp 97.6 F (36.4 C) (Oral)   Ht 5' 0.98" (1.549 m)   Wt 158 lb 8 oz (71.9 kg)   SpO2 99%   BMI 29.96 kg/m   Wt Readings from Last 3 Encounters:  08/26/22 158 lb 8 oz (71.9 kg)  08/12/22 155 lb 11.2 oz (70.6 kg)  08/04/22 153 lb (69.4 kg)    Physical Exam Vitals and nursing note reviewed.  Constitutional:      General: She is awake. She is not in acute distress.    Appearance: She is well-developed and well-groomed. She is not ill-appearing or toxic-appearing.  HENT:     Head: Normocephalic.     Right Ear: Hearing and external ear normal.     Left Ear: Hearing and external ear normal.  Eyes:     General: Lids are normal.        Right eye: No discharge.        Left eye: No discharge.     Conjunctiva/sclera: Conjunctivae normal.     Pupils: Pupils are equal, round, and reactive to light.  Neck:     Thyroid: No thyromegaly.     Vascular: No carotid bruit or JVD.  Cardiovascular:     Rate and Rhythm: Normal rate and regular rhythm.     Pulses:          Dorsalis pedis pulses are 1+ on the right side and 1+ on the left side.       Posterior tibial pulses are 1+ on the right side and 1+ on the left side.     Heart sounds: Normal heart sounds. No murmur heard.    No gallop.  Pulmonary:     Effort: Pulmonary effort is normal.      Breath sounds: Normal breath sounds.  Abdominal:     General: Bowel sounds are normal. There is no distension.     Palpations: Abdomen is soft.     Tenderness: There is no abdominal tenderness.  Musculoskeletal:     Cervical back: Normal range of motion and neck supple.     Right lower leg: No edema.     Left lower leg: No edema.  Feet:     Right foot:     Protective Sensation: 10 sites tested.  7 sites sensed.     Skin integrity: Skin integrity normal.     Left foot:     Protective Sensation: 10 sites tested.  8 sites sensed.     Skin integrity: Skin integrity normal.     Comments: Right heel tenderness. Lymphadenopathy:     Cervical: No cervical adenopathy.  Skin:    General: Skin is warm and dry.  Neurological:     Mental Status: She is alert.     Comments: Oriented to year, month, President.  Not to day of week.  Psychiatric:        Attention and Perception: Attention normal.        Mood and Affect: Mood normal.        Speech: Speech normal.        Behavior: Behavior normal. Behavior is cooperative.        Thought Content: Thought content normal.        Cognition and Memory: Memory is impaired.    Results for orders placed or performed in visit on 08/12/22  CBC with Differential/Platelet  Result Value Ref Range   WBC 6.2 3.4 - 10.8 x10E3/uL   RBC 3.53 (L) 3.77 - 5.28 x10E6/uL   Hemoglobin 10.8 (L) 11.1 - 15.9 g/dL   Hematocrit 16.1 09.6 - 46.6 %   MCV 98 (H) 79 - 97 fL   MCH 30.6 26.6 - 33.0 pg   MCHC 31.4 (L) 31.5 - 35.7 g/dL   RDW 04.5 40.9 - 81.1 %   Platelets 231 150 - 450 x10E3/uL   Neutrophils 55 Not Estab. %   Lymphs 28 Not Estab. %   Monocytes 12 Not Estab. %   Eos 4 Not Estab. %   Basos 1 Not Estab. %   Neutrophils Absolute 3.4 1.4 - 7.0 x10E3/uL   Lymphocytes Absolute 1.7 0.7 - 3.1 x10E3/uL   Monocytes Absolute 0.8 0.1 - 0.9 x10E3/uL   EOS (ABSOLUTE) 0.3 0.0 - 0.4 x10E3/uL   Basophils Absolute 0.0 0.0 -  0.2 x10E3/uL   Immature Granulocytes 0 Not  Estab. %   Immature Grans (Abs) 0.0 0.0 - 0.1 x10E3/uL  Comprehensive metabolic panel  Result Value Ref Range   Glucose 85 70 - 99 mg/dL   BUN 48 (H) 8 - 27 mg/dL   Creatinine, Ser 1.61 (H) 0.57 - 1.00 mg/dL   eGFR 27 (L) >09 UE/AVW/0.98   BUN/Creatinine Ratio 27 12 - 28   Sodium 135 134 - 144 mmol/L   Potassium 5.0 3.5 - 5.2 mmol/L   Chloride 99 96 - 106 mmol/L   CO2 21 20 - 29 mmol/L   Calcium 9.2 8.7 - 10.3 mg/dL   Total Protein 5.9 (L) 6.0 - 8.5 g/dL   Albumin 4.1 3.7 - 4.7 g/dL   Globulin, Total 1.8 1.5 - 4.5 g/dL   Albumin/Globulin Ratio 2.3 (H) 1.2 - 2.2   Bilirubin Total 0.3 0.0 - 1.2 mg/dL   Alkaline Phosphatase 76 44 - 121 IU/L   AST 21 0 - 40 IU/L   ALT 14 0 - 32 IU/L  TSH  Result Value Ref Range   TSH 1.050 0.450 - 4.500 uIU/mL  T4, free  Result Value Ref Range   Free T4 1.45 0.82 - 1.77 ng/dL      Assessment & Plan:   Problem List Items Addressed This Visit       Genitourinary   CKD (chronic kidney disease) stage 4, GFR 15-29 ml/min    Chronic, ongoing.  Currently CKD 4.  Educated her on diet and fluid intake + current medications. She is scheduled to see nephrology upcoming for initial visit, which will be beneficial with current mild decline.  Urine ALB 25 June 2022.  Recommend continue all current medications and wear compression hose at home on during day and off at night.        Other   Bilateral foot pain    Scheduled upcoming to see podiatry, will continue this collaboration.      History of stroke - Primary    History of CVA, most recent on 08/04/22.  Continue collaboration with neurology and current medication regimen for prevention.  Labs up to date.  Continue OT/PT in home.  Close monitoring.      Loss of memory    Noted on past diagnoses, 6CIT 12 beginning of March, difficulty with recall.  No current memory medications, may benefit from this in future.  Continue collaboration with neurology.  CCM team involved.  May benefit imaging in  future.  Have requested she bring a family member or friend to visit, but she reports they all work and are unable to attend.  This may be beneficial to have at visits though to help her with recall.        Follow up plan: Return in about 2 months (around 10/26/2022) for as scheduled June 4th.

## 2022-08-26 NOTE — Telephone Encounter (Signed)
Copied from CRM [id number]. Topic: General - Other >> Aug 26, 2022  4:07 PM Clide Dales wrote: Home Health Verbal Orders - Caller/Agency: [facility] Callback Number: [phone] Requesting OT/PT/Skilled Nursing/Social Work/Speech Therapy: OT Frequency: 1w5

## 2022-08-26 NOTE — Assessment & Plan Note (Signed)
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  Continue collaboration with neurology and current medication regimen for prevention.  Labs up to date.  Continue OT/PT in home.  Close monitoring.

## 2022-08-29 ENCOUNTER — Telehealth: Payer: Self-pay | Admitting: Nurse Practitioner

## 2022-08-29 NOTE — Telephone Encounter (Signed)
Verbal orders given per Jolene 

## 2022-08-29 NOTE — Telephone Encounter (Signed)
Contacted Tracie Fisher to schedule their annual wellness visit. Appointment made for 09/13/2022.  Verlee Rossetti; Care Guide Ambulatory Clinical Support [place] [facility] Direct Dial: [phone]

## 2022-09-05 ENCOUNTER — Telehealth: Payer: 59

## 2022-09-05 ENCOUNTER — Telehealth: Payer: Self-pay

## 2022-09-05 NOTE — Telephone Encounter (Signed)
   CCM RN Visit Note   09-05-2022 Name: YALDA HERD MRN: [id number]      DOB: 1935/07/14  Subjective: Tracie Fisher is a 87 y.o. year old female who is a primary care patient of Aura Dials, NP The patient was referred to the Chronic Care Management team for assistance with care management needs subsequent to provider initiation of CCM services and plan of care.      An unsuccessful telephone outreach was attempted today to contact the patient about Chronic Care Management needs.    Plan:A HIPAA compliant phone message was left for the patient providing contact information and requesting a return call.  Alto Denver RN, MSN, CCM RN Care Manager  Chronic Care Management Direct Number: [phone]

## 2022-09-06 ENCOUNTER — Telehealth: Payer: Self-pay | Admitting: Nurse Practitioner

## 2022-09-06 NOTE — Telephone Encounter (Unsigned)
Copied from CRM [id number]. Topic: General - Inquiry >> Sep 06, 2022 10:09 AM De Blanch wrote: Reason for CRM: Megan from [facility] stated Dr. wants PCP to get the ball rolling with pt's medications being bubble pack or pill packed pt is confused on when to take her medications or what she is supposed to be taking. Stated they manage Plavix and Lyrica.  Please advise.

## 2022-09-08 ENCOUNTER — Telehealth: Payer: 59

## 2022-09-08 ENCOUNTER — Telehealth: Payer: Self-pay

## 2022-09-08 NOTE — Telephone Encounter (Signed)
LVM for patient to return call about her pharmacy and starting pill pack

## 2022-09-08 NOTE — Telephone Encounter (Signed)
   CCM RN Visit Note  09-08-2022 Name: Tracie Fisher MRN: [id number]      DOB: Oct 10, 1935  Subjective: Tracie Fisher is a 87 y.o. year old female who is a primary care patient of Aura Dials, NP. The patient was referred to the Chronic Care Management team for assistance with care management needs subsequent to provider initiation of CCM services and plan of care.      Second unsuccessful telephone outreach was attempted today to contact the patient about Chronic Care Management needs.    Plan:The care management team will reach out to the patient again over the next 30 days.  Alto Denver RN, MSN, CCM RN Care Manager  Chronic Care Management Direct Number: [phone]

## 2022-09-13 ENCOUNTER — Ambulatory Visit (INDEPENDENT_AMBULATORY_CARE_PROVIDER_SITE_OTHER): Payer: 59

## 2022-09-13 VITALS — BP 122/62 | Ht 60.5 in | Wt 156.4 lb

## 2022-09-13 DIAGNOSIS — Z0000 Encounter for general adult medical examination without abnormal findings: Secondary | ICD-10-CM

## 2022-09-13 NOTE — Progress Notes (Signed)
Subjective:   Tracie Fisher is a 87 y.o. female who presents for an Initial Medicare Annual Wellness Visit.  Review of Systems     Cardiac Risk Factors include: advanced age (>71men, >62 women);hypertension     Objective:    Today's Vitals   09/13/22 1055 09/13/22 1057  BP: 122/62   Weight: 156 lb 6.4 oz (70.9 kg)   Height: 5' 0.5" (1.537 m)   PainSc:  4    Body mass index is 30.04 kg/m.     09/13/2022   11:14 AM 08/04/2022    1:00 PM 08/04/2022   10:01 AM 04/25/2022   11:30 AM 09/02/2021    3:44 PM 10/13/2019    3:56 PM 03/21/2017    7:34 AM  Advanced Directives  Does Patient Have a Medical Advance Directive? No No No No No No No  Would patient like information on creating a medical advance directive? No - Patient declined No - Patient declined   No - Patient declined No - Patient declined No - Patient declined    Current Medications (verified) Outpatient Encounter Medications as of 09/13/2022  Medication Sig   ALPRAZolam (XANAX) 1 MG tablet Take 0.5 mg (1/2 tablet) by mouth in the morning and 1 mg (1 tablet) by mouth at bedtime.   clopidogrel (PLAVIX) 75 MG tablet Take by mouth.   cyanocobalamin (V-R VITAMIN B-12) 500 MCG tablet Take 500 mcg by mouth daily.    ferrous gluconate (FERGON) 240 (27 FE) MG tablet Take 240 mg by mouth 2 (two) times daily.   levothyroxine (SYNTHROID) 50 MCG tablet Take 1 tablet (50 mcg total) by mouth daily before breakfast.   losartan (COZAAR) 50 MG tablet Take 50 mg daily by mouth.   metoprolol tartrate (LOPRESSOR) 25 MG tablet Take 25 mg by mouth 2 (two) times daily.   pravastatin (PRAVACHOL) 20 MG tablet Take by mouth.   pregabalin (LYRICA) 75 MG capsule Take 75 mg by mouth 3 (three) times daily.   Probiotic Product (PROBIOTIC DAILY PO) Take 1 capsule by mouth daily as needed.   spironolactone (ALDACTONE) 25 MG tablet Take 25 mg by mouth daily.   furosemide (LASIX) 20 MG tablet Take 20 mg by mouth 2 (two) times daily. (Patient not  taking: Reported on 09/13/2022)   No facility-administered encounter medications on file as of 09/13/2022.    Allergies (verified) Aspirin, Penicillins, and Penicillin g   History: Past Medical History:  Diagnosis Date   Cancer (HCC)    SKIN   Complication of anesthesia    Depression    Dysrhythmia    GERD (gastroesophageal reflux disease)    Hiatal hernia    HOH (hard of hearing)    Hypertension    Hypothyroidism    PONV (postoperative nausea and vomiting)    Thyroid disease    Past Surgical History:  Procedure Laterality Date   APPENDECTOMY     BACK SURGERY     MULTIPLE/ROD IN BACK   BREAST CYST ASPIRATION Left "years ago"   CATARACT EXTRACTION W/PHACO Left 03/21/2017   Procedure: CATARACT EXTRACTION PHACO AND INTRAOCULAR LENS PLACEMENT (IOC);  Surgeon: Galen Manila, MD;  Location: [facility] ORS;  Service: Ophthalmology;  Laterality: Left;  [place] 00:48 AP% 17.8 CDE 8.57 Fluid pack lot # [id number]   CATARACT EXTRACTION W/PHACO Right 04/11/2017   Procedure: CATARACT EXTRACTION PHACO AND INTRAOCULAR LENS PLACEMENT (IOC);  Surgeon: Galen Manila, MD;  Location: [facility] ORS;  Service: Ophthalmology;  Laterality: Right;  [place] 01:09.5 AP% 15.8  CDE 11.04 Fluid Pack lot # [id number]   CHOLECYSTECTOMY     COLON SURGERY     BOWEL OBSTRUCTION   COLONOSCOPY WITH PROPOFOL N/A 04/25/2016   Procedure: COLONOSCOPY WITH PROPOFOL;  Surgeon: Wyline Mood, MD;  Location: [facility] ENDOSCOPY;  Service: Endoscopy;  Laterality: N/A;   ESOPHAGOGASTRODUODENOSCOPY (EGD) WITH PROPOFOL N/A 04/25/2016   Procedure: ESOPHAGOGASTRODUODENOSCOPY (EGD) WITH PROPOFOL;  Surgeon: Wyline Mood, MD;  Location: [facility] END[facility]COPY;  Service: Endoscopy;  Laterality: N/A;   EXPLORATORY LAPAROTOMY  10/18/2011   blockage small intestines   GIVENS CAPSULE STUDY N/A 05/11/2016   Procedure: GIVENS CAPSULE STUDY;  Surgeon: Wyline Mood, MD;  Location: [facility] ENDOSCOPY;  Service: Endoscopy;  Laterality: N/A;   Family History  Problem  Relation Age of Onset   Breast cancer Sister 92   Heart attack Brother    Heart disease Father    Social History   Socioeconomic History   Marital status: Single    Spouse name: Not on file   Number of children: Not on file   Years of education: Not on file   Highest education level: Not on file  Occupational History   Not on file  Tobacco Use   Smoking status: Former    Years: 20    Types: Cigarettes   Smokeless tobacco: Never  Vaping Use   Vaping Use: Never used  Substance and Sexual Activity   Alcohol use: Yes    Comment: 1 beer /day or 2 occ   Drug use: No   Sexual activity: Yes    Birth control/protection: Post-menopausal  Other Topics Concern   Not on file  Social History Narrative   Not on file   Social Determinants of Health   Financial Resource Strain: Low Risk  (09/13/2022)   Overall Financial Resource Strain (CARDIA)    Difficulty of Paying Living Expenses: Not hard at all  Food Insecurity: No Food Insecurity (09/13/2022)   Hunger Vital Sign    Worried About Running Out of Food in the Last Year: Never true    Ran Out of Food in the Last Year: Never true  Transportation Needs: No Transportation Needs (09/13/2022)   PRAPARE - Administrator, Civil Service (Medical): No    Lack of Transportation (Non-Medical): No  Physical Activity: Insufficiently Active (09/13/2022)   Exercise Vital Sign    Days of Exercise per Week: 2 days    Minutes of Exercise per Session: 20 min  Stress: No Stress Concern Present (09/13/2022)   [employer] of Occupational Health - Occupational Stress Questionnaire    Feeling of Stress : Not at all  Social Connections: Socially Isolated (09/13/2022)   Social Connection and Isolation Panel [NHANES]    Frequency of Communication with Friends and Family: More than three times a week    Frequency of Social Gatherings with Friends and Family: More than three times a week    Attends Religious Services: Never    Loss adjuster, chartered or Organizations: No    Attends Banker Meetings: Never    Marital Status: Widowed    Tobacco Counseling Counseling given: Not Answered   Clinical Intake:  Pre-visit preparation completed: Yes  Pain : 0-10 Pain Score: 4  Pain Type: Chronic pain Pain Location: Heel Pain Orientation: Left     Nutritional Risks: None Diabetes: No  How often do you need to have someone help you when you read instructions, pamphlets, or other written materials from your doctor or pharmacy?: 1 -  Never  Diabetic?no  Interpreter Needed?: No  Information entered by :: Kennedy Bucker, LPN   Activities of Daily Living    09/13/2022   11:15 AM 08/04/2022    1:00 PM  In your present state of health, do you have any difficulty performing the following activities:  Hearing? 1 0  Vision? 0 0  Difficulty concentrating or making decisions? 0 0  Walking or climbing stairs? 1 0  Dressing or bathing? 0 0  Doing errands, shopping? 0 0  Preparing Food and eating ? N   Using the Toilet? N   In the past six months, have you accidently leaked urine? N   Do you have problems with loss of bowel control? N   Managing your Medications? N   Managing your Finances? N   Housekeeping or managing your Housekeeping? N     Patient Care Team: Marjie Skiff, NP as PCP - General (Nurse Practitioner) Dalia Heading, MD as Consulting Physician (Cardiology) Lemar Livings Merrily Pew, MD (General Surgery) Marlowe Sax, RN as Case Manager (General Practice)  Indicate any recent Medical Services you may have received from other than [facility] providers in the past year (date may be approximate).     Assessment:   This is a routine wellness examination for [place].  Hearing/Vision screen Hearing Screening - Comments:: No aids Vision Screening - Comments:: Wears glasses- [facility]  Dietary issues and exercise activities discussed: Current Exercise Habits: Home exercise routine, Type of exercise:  walking, Time (Minutes): 20, Frequency (Times/Week): 2, Weekly Exercise (Minutes/Week): 40, Intensity: Mild   Goals Addressed             This Visit's Progress    DIET - EAT MORE FRUITS AND VEGETABLES         Depression Screen    09/13/2022   11:11 AM 08/26/2022   11:22 AM 07/15/2022   10:43 AM 06/16/2022   11:27 AM  PHQ 2/9 Scores  PHQ - 2 Score 0 0 0 0  PHQ- 9 Score 0 1 0 0    Fall Risk    09/13/2022   11:15 AM 08/26/2022   11:22 AM 08/02/2022    2:39 PM 07/15/2022   11:31 AM 07/15/2022   10:43 AM  Fall Risk   Falls in the past year? 0 0 0 0 0  Number falls in past yr: 0 0 0 0 0  Injury with Fall? 0 0 0 0 0  Risk for fall due to : No Fall Risks No Fall Risks No Fall Risks No Fall Risks No Fall Risks  Follow up Falls prevention discussed;Falls evaluation completed Falls evaluation completed Falls evaluation completed;Education provided;Falls prevention discussed Education provided Falls evaluation completed    FALL RISK PREVENTION PERTAINING TO THE HOME:  Any stairs in or around the home? No  If so, are there any without handrails? No  Home free of loose throw rugs in walkways, pet beds, electrical cords, etc? Yes  Adequate lighting in your home to reduce risk of falls? Yes   ASSISTIVE DEVICES UTILIZED TO PREVENT FALLS:  Life alert? No  Use of a cane, walker or w/c? No  Grab bars in the bathroom? Yes  Shower chair or bench in shower? No  Elevated toilet seat or a handicapped toilet? Yes   TIMED UP AND GO:  Was the test performed? Yes .  Length of time to ambulate 10 feet: 6 sec.   Gait slow and steady without use of assistive device  Cognitive Function:        09/13/2022   11:20 AM 07/15/2022   11:31 AM  6CIT Screen  What Year? 0 points 0 points  What month? 0 points 0 points  What time? 0 points 0 points  Count back from 20 4 points 0 points  Months in reverse 4 points 4 points  Repeat phrase 6 points 8 points  Total Score 14 points 12 points     Immunizations  There is no immunization history on file for this patient.  TDAP status: Due, Education has been provided regarding the importance of this vaccine. Advised may receive this vaccine at local pharmacy or Health Dept. Aware to provide a copy of the vaccination record if obtained from local pharmacy or Health Dept. Verbalized acceptance and understanding.  Flu Vaccine status: Declined, Education has been provided regarding the importance of this vaccine but patient still declined. Advised may receive this vaccine at local pharmacy or Health Dept. Aware to provide a copy of the vaccination record if obtained from local pharmacy or Health Dept. Verbalized acceptance and understanding.  Pneumococcal vaccine status: Declined,  Education has been provided regarding the importance of this vaccine but patient still declined. Advised may receive this vaccine at local pharmacy or Health Dept. Aware to provide a copy of the vaccination record if obtained from local pharmacy or Health Dept. Verbalized acceptance and understanding.   Covid-19 vaccine status: Declined, Education has been provided regarding the importance of this vaccine but patient still declined. Advised may receive this vaccine at local pharmacy or Health Dept.or vaccine clinic. Aware to provide a copy of the vaccination record if obtained from local pharmacy or Health Dept. Verbalized acceptance and understanding.  Qualifies for Shingles Vaccine? Yes   Zostavax completed No   Shingrix Completed?: No.    Education has been provided regarding the importance of this vaccine. Patient has been advised to call insurance company to determine out of pocket expense if they have not yet received this vaccine. Advised may also receive vaccine at local pharmacy or Health Dept. Verbalized acceptance and understanding.  Screening Tests Health Maintenance  Topic Date Due   COVID-19 Vaccine (1) Never done   DTaP/Tdap/Td (1 - Tdap) Never  done   Zoster Vaccines- Shingrix (1 of 2) Never done   Pneumonia Vaccine 30+ Years old (1 of 1 - PCV) Never done   INFLUENZA VACCINE  12/15/2022   Medicare Annual Wellness (AWV)  09/13/2023   DEXA SCAN  08/03/2027   HPV VACCINES  Aged Out    Health Maintenance  Health Maintenance Due  Topic Date Due   COVID-19 Vaccine (1) Never done   DTaP/Tdap/Td (1 - Tdap) Never done   Zoster Vaccines- Shingrix (1 of 2) Never done   Pneumonia Vaccine 65+ Years old (1 of 1 - PCV) Never done    Colorectal cancer screening: No longer required.   Mammogram status: No longer required due to age.  Lung Cancer Screening: (Low Dose CT Chest recommended if Age 18-80 years, 30 pack-year currently smoking OR have quit w/in 15years.) does not qualify.   Additional Screening:  Hepatitis C Screening: does not qualify; Completed no  Vision Screening: Recommended annual ophthalmology exams for early detection of glaucoma and other disorders of the eye. Is the patient up to date with their annual eye exam?  Yes  Who is the provider or what is the name of the office in which the patient attends annual eye exams? [facility] If pt  is not established with a provider, would they like to be referred to a provider to establish care? No .   Dental Screening: Recommended annual dental exams for proper oral hygiene  Community Resource Referral / Chronic Care Management: CRR required this visit?  No   CCM required this visit?  No      Plan:     I have personally reviewed and noted the following in the patient's chart:   Medical and social history Use of alcohol, tobacco or illicit drugs  Current medications and supplements including opioid prescriptions. Patient is not currently taking opioid prescriptions. Functional ability and status Nutritional status Physical activity Advanced directives List of other physicians Hospitalizations, surgeries, and ER visits in previous 12 months Vitals Screenings  to include cognitive, depression, and falls Referrals and appointments  In addition, I have reviewed and discussed with patient certain preventive protocols, quality metrics, and best practice recommendations. A written personalized care plan for preventive services as well as general preventive health recommendations were provided to patient.     Hal Hope, LPN   1/61/0960   Nurse Notes: none

## 2022-09-13 NOTE — Patient Instructions (Signed)
Tracie Fisher , Thank you for taking time to come for your Medicare Wellness Visit. I appreciate your ongoing commitment to your health goals. Please review the following plan we discussed and let me know if I can assist you in the future.   These are the goals we discussed:  Goals      CCM Expected Outcome:  Monitor, Self-Manage and Reduce Symptoms of: Memory changes     Current Barriers:  Chronic Disease Management support and education needs related to memory changes   Planned Interventions: Evaluation of current treatment plan related to dementia or memory changes and patient's adherence to plan as established by provider Advised patient to call the office for changes in memory, increased anxiety or acute changes in mental status Provided education to patient re: safety in the home, falls preventions, support system, and medication safety Reviewed medications with patient and discussed compliance. The patient keeps her medications in their original bottles and she has them set up where she remembers to take them each morning. She denies any issues with making sure she takes her medications as directed. Review of the ability for pharmacy support if needed. The patient denies any needs at this time related to medication needs. Will continue to monitor.  Reviewed scheduled/upcoming provider appointments including 10-18-2022 at 1040 am Discussed plans with patient for ongoing care management follow up and provided patient with direct contact information for care management team Advised patient to discuss changes in her memory, new questions or concerns about her health and well being with provider Screening for signs and symptoms of depression related to chronic disease state  Assessed social determinant of health barriers The patient denies any falls at this time, she uses a rollator walker, does have to pace her activity and rest at times, uses a wheelchair sometimes when she goes to different  places. Did lose her food stamp card and went earlier today to get assistance with a replacement. She still drives and takes care of herself. She states her family is available if needed. Her son Tracie Fisher always comes on Sundays and they go out to eat or she fixes something for them to eat. She also has 2 sisters that she talks to on a regular basis. She has several friends at the apartment that she lives at. She feels she is doing well to be 87 years old. Review of the availability of the RNCM to help support her with her health and well being. The patient has the [facility] number to call for needs in between outreaches.   Symptom Management: Take medications as prescribed   Attend all scheduled provider appointments Call provider office for new concerns or questions  call the Suicide and Crisis Lifeline: 988 call the [place] National Suicide Prevention Lifeline: 484-604-9988 or TTY: (339)684-7933 TTY 587-753-1635) to talk to a trained counselor call 1-800-273-TALK (toll free, 24 hour hotline) if experiencing a Mental Health or Behavioral Health Crisis   Follow Up Plan: Telephone follow up appointment with care management team member scheduled for: 09-05-2022 at 230 pm       CCM Expected Outcome:  Monitor, Self-Manage, and Reduce Symptoms of Hypertension     Current Barriers:  Chronic Disease Management support and education needs related to effective management of HTN  Planned Interventions: Evaluation of current treatment plan related to hypertension self management and patient's adherence to plan as established by provider. Denies any issues with her blood pressures or heart health;   Provided education to patient re: stroke prevention,  s/s of heart attack and stroke; Reviewed prescribed diet heart healthy diet Reviewed medications with patient and discussed importance of compliance;  Discussed plans with patient for ongoing care management follow up and provided patient with direct contact  information for care management team; Advised patient, providing education and rationale, to monitor blood pressure daily and record, calling PCP for findings outside established parameters;  Reviewed scheduled/upcoming provider appointments including: 10-18-2022 at 1040 am Advised patient to discuss changes in HTN or heart health with provider; Provided education on prescribed diet heart healthy diet;  Discussed complications of poorly controlled blood pressure such as heart disease, stroke, circulatory complications, vision complications, kidney impairment, sexual dysfunction;  Screening for signs and symptoms of depression related to chronic disease state;  Assessed social determinant of health barriers;   Symptom Management: Take medications as prescribed   Attend all scheduled provider appointments Call provider office for new concerns or questions  call the Suicide and Crisis Lifeline: 988 call the [place] National Suicide Prevention Lifeline: (640)085-1656 or TTY: 367-839-3601 TTY 325 280 1037) to talk to a trained counselor call 1-800-273-TALK (toll free, 24 hour hotline) if experiencing a Mental Health or Behavioral Health Crisis  check blood pressure weekly learn about high blood pressure call doctor for signs and symptoms of high blood pressure develop an action plan for high blood pressure keep all doctor appointments take medications for blood pressure exactly as prescribed report new symptoms to your doctor  Follow Up Plan: Telephone follow up appointment with care management team member scheduled for: 09-05-2022 at 230 pm       CCM:  Maintain, Monitor and Self-Manage Symptoms of COPD     Current Barriers:  Chronic Disease Management support and education needs related to effective management of COPD  Planned Interventions: Provided patient with basic written and verbal COPD education on self care/management/and exacerbation prevention. The patient states she can tell when  her allergies and sinuses are acting up. Denies any acute changes at this time. Advised patient to track and manage COPD triggers. Review of triggers that may exacerbate her conditions. She paces her activity and rest when she needs to do so. Provided written and verbal instructions on pursed lip breathing and utilized returned demonstration as teach back Provided instruction about proper use of medications used for management of COPD including inhalers Advised patient to self assesses COPD action plan zone and make appointment with provider if in the yellow zone for 48 hours without improvement Provided education about and advised patient to utilize infection prevention strategies to reduce risk of respiratory infection Discussed the importance of adequate rest and management of fatigue with COPD Screening for signs and symptoms of depression related to chronic disease state  Assessed social determinant of health barriers  Symptom Management: Take medications as prescribed   Attend all scheduled provider appointments Call provider office for new concerns or questions  call the Suicide and Crisis Lifeline: 988 call the [place] National Suicide Prevention Lifeline: (985) 177-9303 or TTY: 234 251 4651 TTY 423-711-6809) to talk to a trained counselor call 1-800-273-TALK (toll free, 24 hour hotline) if experiencing a Mental Health or Behavioral Health Crisis  avoid second hand smoke eliminate smoking in my home identify and remove indoor air pollutants limit outdoor activity during cold weather listen for public air quality announcements every day do breathing exercises every day develop a rescue plan follow rescue plan if symptoms flare-up  Follow Up Plan: Telephone follow up appointment with care management team member scheduled for: 09-05-2022 at 230 pm  DIET - EAT MORE FRUITS AND VEGETABLES        This is a list of the screening recommended for you and due dates:  Health  Maintenance  Topic Date Due   COVID-19 Vaccine (1) Never done   DTaP/Tdap/Td vaccine (1 - Tdap) Never done   Zoster (Shingles) Vaccine (1 of 2) Never done   Pneumonia Vaccine (1 of 1 - PCV) Never done   Flu Shot  12/15/2022   Medicare Annual Wellness Visit  09/13/2023   DEXA scan (bone density measurement)  08/03/2027   HPV Vaccine  Aged Out    Advanced directives: no  Conditions/risks identified: none  Next appointment: Follow up in one year for your annual wellness visit 09/19/23 @ 10:30 am in person   Preventive Care 65 Years and Older, Female Preventive care refers to lifestyle choices and visits with your health care provider that can promote health and wellness. What does preventive care include? A yearly physical exam. This is also called an annual well check. Dental exams once or twice a year. Routine eye exams. Ask your health care provider how often you should have your eyes checked. Personal lifestyle choices, including: Daily care of your teeth and gums. Regular physical activity. Eating a healthy diet. Avoiding tobacco and drug use. Limiting alcohol use. Practicing safe sex. Taking low-dose aspirin every day. Taking vitamin and mineral supplements as recommended by your health care provider. What happens during an annual well check? The services and screenings done by your health care provider during your annual well check will depend on your age, overall health, lifestyle risk factors, and family history of disease. Counseling  Your health care provider may ask you questions about your: Alcohol use. Tobacco use. Drug use. Emotional well-being. Home and relationship well-being. Sexual activity. Eating habits. History of falls. Memory and ability to understand (cognition). Work and work Astronomer. Reproductive health. Screening  You may have the following tests or measurements: Height, weight, and BMI. Blood pressure. Lipid and cholesterol levels.  These may be checked every 5 years, or more frequently if you are over 9 years old. Skin check. Lung cancer screening. You may have this screening every year starting at age 48 if you have a 30-pack-year history of smoking and currently smoke or have quit within the past 15 years. Fecal occult blood test (FOBT) of the stool. You may have this test every year starting at age 36. Flexible sigmoidoscopy or colonoscopy. You may have a sigmoidoscopy every 5 years or a colonoscopy every 10 years starting at age 10. Hepatitis C blood test. Hepatitis B blood test. Sexually transmitted disease (STD) testing. Diabetes screening. This is done by checking your blood sugar (glucose) after you have not eaten for a while (fasting). You may have this done every 1-3 years. Bone density scan. This is done to screen for osteoporosis. You may have this done starting at age 61. Mammogram. This may be done every 1-2 years. Talk to your health care provider about how often you should have regular mammograms. Talk with your health care provider about your test results, treatment options, and if necessary, the need for more tests. Vaccines  Your health care provider may recommend certain vaccines, such as: Influenza vaccine. This is recommended every year. Tetanus, diphtheria, and acellular pertussis (Tdap, Td) vaccine. You may need a Td booster every 10 years. Zoster vaccine. You may need this after age 40. Pneumococcal 13-valent conjugate (PCV13) vaccine. One dose is recommended after age 43. Pneumococcal polysaccharide (  PPSV23) vaccine. One dose is recommended after age 26. Talk to your health care provider about which screenings and vaccines you need and how often you need them. This information is not intended to replace advice given to you by your health care provider. Make sure you discuss any questions you have with your health care provider. Document Released: 05/29/2015 Document Revised: 01/20/2016 Document  Reviewed: 03/03/2015 Elsevier Interactive Patient Education  2017 [employer].  Fall Prevention in the Home Falls can cause injuries. They can happen to people of all ages. There are many things you can do to make your home safe and to help prevent falls. What can I do on the outside of my home? Regularly fix the edges of walkways and driveways and fix any cracks. Remove anything that might make you trip as you walk through a door, such as a raised step or threshold. Trim any bushes or trees on the path to your home. Use bright outdoor lighting. Clear any walking paths of anything that might make someone trip, such as rocks or tools. Regularly check to see if handrails are loose or broken. Make sure that both sides of any steps have handrails. Any raised decks and porches should have guardrails on the edges. Have any leaves, snow, or ice cleared regularly. Use sand or salt on walking paths during winter. Clean up any spills in your garage right away. This includes oil or grease spills. What can I do in the bathroom? Use night lights. Install grab bars by the toilet and in the tub and shower. Do not use towel bars as grab bars. Use non-skid mats or decals in the tub or shower. If you need to sit down in the shower, use a plastic, non-slip stool. Keep the floor dry. Clean up any water that spills on the floor as soon as it happens. Remove soap buildup in the tub or shower regularly. Attach bath mats securely with double-sided non-slip rug tape. Do not have throw rugs and other things on the floor that can make you trip. What can I do in the bedroom? Use night lights. Make sure that you have a light by your bed that is easy to reach. Do not use any sheets or blankets that are too big for your bed. They should not hang down onto the floor. Have a firm chair that has side arms. You can use this for support while you get dressed. Do not have throw rugs and other things on the floor that can  make you trip. What can I do in the kitchen? Clean up any spills right away. Avoid walking on wet floors. Keep items that you use a lot in easy-to-reach places. If you need to reach something above you, use a strong step stool that has a grab bar. Keep electrical cords out of the way. Do not use floor polish or wax that makes floors slippery. If you must use wax, use non-skid floor wax. Do not have throw rugs and other things on the floor that can make you trip. What can I do with my stairs? Do not leave any items on the stairs. Make sure that there are handrails on both sides of the stairs and use them. Fix handrails that are broken or loose. Make sure that handrails are as long as the stairways. Check any carpeting to make sure that it is firmly attached to the stairs. Fix any carpet that is loose or worn. Avoid having throw rugs at the top or  bottom of the stairs. If you do have throw rugs, attach them to the floor with carpet tape. Make sure that you have a light switch at the top of the stairs and the bottom of the stairs. If you do not have them, ask someone to add them for you. What else can I do to help prevent falls? Wear shoes that: Do not have high heels. Have rubber bottoms. Are comfortable and fit you well. Are closed at the toe. Do not wear sandals. If you use a stepladder: Make sure that it is fully opened. Do not climb a closed stepladder. Make sure that both sides of the stepladder are locked into place. Ask someone to hold it for you, if possible. Clearly mark and make sure that you can see: Any grab bars or handrails. First and last steps. Where the edge of each step is. Use tools that help you move around (mobility aids) if they are needed. These include: Canes. Walkers. Scooters. Crutches. Turn on the lights when you go into a dark area. Replace any light bulbs as soon as they burn out. Set up your furniture so you have a clear path. Avoid moving your furniture  around. If any of your floors are uneven, fix them. If there are any pets around you, be aware of where they are. Review your medicines with your doctor. Some medicines can make you feel dizzy. This can increase your chance of falling. Ask your doctor what other things that you can do to help prevent falls. This information is not intended to replace advice given to you by your health care provider. Make sure you discuss any questions you have with your health care provider. Document Released: 02/26/2009 Document Revised: 10/08/2015 Document Reviewed: 06/06/2014 Elsevier Interactive Patient Education  2017 [employer].

## 2022-09-14 ENCOUNTER — Encounter: Payer: Self-pay | Admitting: Nurse Practitioner

## 2022-09-20 ENCOUNTER — Telehealth: Payer: Self-pay

## 2022-09-20 NOTE — Progress Notes (Signed)
  Chronic Care Management Note  09/20/2022 Name: [name] MRN: [id number] DOB: 1935-10-30  Tracie Fisher is a 87 y.o. year old female who is a primary care patient of Marjie Skiff, NP and is actively engaged with the Chronic Care Management team. I reached out to Archie Endo by phone today to assist with re-scheduling a follow up visit with the RN Case Manager  Follow up plan: Unsuccessful telephone outreach attempt made. A HIPAA compliant phone message was left for the patient providing contact information and requesting a return call.  The care management team will reach out to the patient again over the next 7 days.  If patient returns call to provider office, please advise to call CCM Care Guide Penne Lash  at [phone]  Penne Lash, RMA Care Guide [facility]  [place], [place] [address] Direct Dial: [phone] [email or web address]

## 2022-09-26 DIAGNOSIS — E875 Hyperkalemia: Secondary | ICD-10-CM | POA: Insufficient documentation

## 2022-09-28 NOTE — Progress Notes (Signed)
  Chronic Care Management Note  09/28/2022 Name: Tracie Fisher MRN: [id number] DOB: 13-Mar-1936  Tracie Fisher is a 87 y.o. year old female who is a primary care patient of Marjie Skiff, NP and is actively engaged with the Chronic Care Management team. I reached out to Archie Endo by phone today to assist with re-scheduling a follow up visit with the RN Case Manager  Follow up plan: Telephone appointment with care management team member scheduled for:10/07/2022  Penne Lash, RMA Care Guide [facility]  [place], [place] [address] Direct Dial: [phone] [email or web address]

## 2022-10-07 ENCOUNTER — Telehealth: Payer: 59

## 2022-10-07 ENCOUNTER — Telehealth: Payer: Self-pay | Admitting: Nurse Practitioner

## 2022-10-07 NOTE — Telephone Encounter (Signed)
Copied from CRM [id number]. Topic: General - Other >> Oct 07, 2022  3:27 PM Epimenio Foot F wrote: Reason for CRM: Gerri Spore with [facility] is calling to extend PT orders. Gerri Spore would like to see pt 1 time a week for 4 weeks. Gerri Spore can be reached at [phone].

## 2022-10-07 NOTE — Telephone Encounter (Signed)
Verbal orders given per Jolene 

## 2022-10-18 ENCOUNTER — Ambulatory Visit: Payer: 59 | Admitting: Nurse Practitioner

## 2022-10-19 ENCOUNTER — Telehealth: Payer: 59

## 2022-10-19 ENCOUNTER — Telehealth: Payer: Self-pay

## 2022-10-19 NOTE — Telephone Encounter (Signed)
   CCM RN Visit Note   10-19-2022 Name: Tracie Fisher MRN: [id number]      DOB: 11-26-1935  Subjective: Tracie Fisher is a 87 y.o. year old female who is a primary care patient of Aura Dials, NP. The patient was referred to the Chronic Care Management team for assistance with care management needs subsequent to provider initiation of CCM services and plan of care.      Second unsuccessful telephone outreach was attempted today to contact the patient about Chronic Care Management needs.    Plan:A HIPAA compliant phone message was left for the patient providing contact information and requesting a return call.  Alto Denver RN, MSN, CCM RN Care Manager  Chronic Care Management Direct Number: [phone]

## 2022-10-23 NOTE — Patient Instructions (Signed)
Food Basics for Chronic Kidney Disease Chronic kidney disease (CKD) is when your kidneys are not working well. They cannot remove waste, fluids, and other substances from your blood the way they should. These substances can build up, which can worsen kidney damage and affect how your body works. Eating certain foods can lead to a buildup of these substances. Changing your diet can help prevent more kidney damage. Diet changes may also delay dialysis or even keep you from needing it. What nutrients should I limit? Work with your treatment team and a food expert (dietitian) to make a meal plan that's right for you. Foods you can eat and foods you should limit or avoid will depend on the stage of your kidney disease and any other health conditions you have. The items listed below are not a complete list. Talk with your dietitian to learn what is best for you. Potassium Potassium affects how steadily your heart beats. Too much potassium in your blood can cause an irregular heartbeat or even a heart attack. You may need to limit foods that are high in potassium, such as: Liquid milk and soy milk. Salt substitutes that contain potassium. Fruits like bananas, apricots, nectarines, melon, prunes, raisins, kiwi, and oranges. Vegetables, such as potatoes, sweet potatoes, yams, tomatoes, leafy greens, beets, avocado, pumpkin, and winter squash. Beans, like lima beans. Nuts. Phosphorus Phosphorus is a mineral found in your bones. You need a balance between calcium and phosphorus to build and maintain healthy bones. Too much added phosphorus from the foods you eat can pull calcium from your bones. Losing calcium can make your bones weak and more likely to break. Too much phosphorus can also make your skin itch. You may need to limit foods that are high in phosphorus or that have added phosphorus, such as: Liquid milk and dairy products. Dark-colored sodas or soft drinks. Bran cereals and  oatmeal. Protein  Protein helps you make and keep muscle. Protein also helps to repair your body's cells and tissues. One of the natural breakdown products of protein is a waste product called urea. When your kidneys are not working well, they cannot remove types of waste like urea. Reducing protein in your diet can help keep urea from building up in your blood. Depending on your stage of kidney disease, you may need to eat smaller portions of foods that are high in protein. Sources of animal protein include: Meat (all types). Fish and seafood. Poultry. Eggs. Dairy. Other protein foods include: Beans and legumes. Nuts and nut butter. Soy, like tofu.  Sodium Salt (sodium) helps to keep a healthy balance of fluids in your body. Too much salt can increase your blood pressure, which can harm your heart and lungs. Extra salt can also cause your body to keep too much fluid, making your kidneys work harder. You may need to limit or avoid foods that are high in salt, such as: Salt seasonings. Soy and teriyaki sauce. Packaged, precooked, cured, or processed meats, such as sausages or meat loaves. Sardines. Salted crackers and snack foods. Fast food. Canned soups and most canned foods. Pickled foods. Vegetable juice. Boxed mixes or ready-to-eat boxed meals and side dishes. Bottled dressings, sauces, and marinades. Talk with your dietitian about how much potassium, phosphorus, protein, and salt you may have each day. Helpful tips Read food labels  Check the amount of salt in foods. Limit foods that have salt or sodium listed among the first five ingredients. Try to eat low-salt foods. Check the ingredient list   for added phosphorus or potassium. "Phos" in an ingredient is a sign that phosphorus has been added. Do not buy foods that are calcium-enriched or that have calcium added to them (are fortified). Buy canned vegetables and beans that say "no salt added" and rinse them before  eating. Lifestyle Limit the amount of protein you eat from animal sources each day. Focus on protein from plant sources, like tofu and dried beans, peas, and lentils. Do not add salt to food when cooking or before eating. Do not eat star fruit. It can be toxic for people with kidney problems. Talk with your health care provider before taking any vitamin or mineral supplements. If told by your health care provider, track how much liquid you drink so you can avoid drinking too much. You may need to include foods you eat that are made mostly from water, like gelatin, ice cream, soups, and juicy fruits and vegetables. If you have diabetes: If you have diabetes (diabetes mellitus) and CKD, you need to keep your blood sugar (glucose) in the target range recommended by your health care provider. Follow your diabetes management plan. This may include: Checking your blood glucose regularly. Taking medicines by mouth, or taking insulin, or both. Exercising for at least 30 minutes on 5 or more days each week, or as told by your health care provider. Tracking how many servings of carbohydrates you eat at each meal. Not using orange juice to treat low blood sugars. Instead, use apple juice, cranberry juice, or clear soda. You may be given guidelines on what foods and nutrients you may eat, and how much you can have each day. This depends on your stage of kidney disease and whether you have high blood pressure (hypertension). Follow the meal plan your dietitian gives you. To learn more: National Institute of Diabetes and Digestive and Kidney Diseases: niddk.nih.gov National Kidney Foundation: kidney.org Summary Chronic kidney disease (CKD) is when your kidneys are not working well. They cannot remove waste, fluids, and other substances from your blood the way they should. These substances can build up, which can worsen kidney damage and affect how your body works. Changing your diet can help prevent more  kidney damage. Diet changes may also delay dialysis or even keep you from needing it. Diet changes are different for each person with CKD. Work with a dietitian to set up a meal plan that is right for you. This information is not intended to replace advice given to you by your health care provider. Make sure you discuss any questions you have with your health care provider. Document Revised: 08/20/2021 Document Reviewed: 08/26/2019 Elsevier Patient Education  2024 Elsevier Inc.  

## 2022-10-26 ENCOUNTER — Encounter: Payer: Self-pay | Admitting: Nurse Practitioner

## 2022-10-26 ENCOUNTER — Ambulatory Visit (INDEPENDENT_AMBULATORY_CARE_PROVIDER_SITE_OTHER): Payer: 59 | Admitting: Nurse Practitioner

## 2022-10-26 VITALS — BP 128/70 | HR 73 | Temp 97.6°F | Ht 60.98 in | Wt 153.6 lb

## 2022-10-26 DIAGNOSIS — D692 Other nonthrombocytopenic purpura: Secondary | ICD-10-CM | POA: Diagnosis not present

## 2022-10-26 DIAGNOSIS — Z8673 Personal history of transient ischemic attack (TIA), and cerebral infarction without residual deficits: Secondary | ICD-10-CM | POA: Diagnosis not present

## 2022-10-26 DIAGNOSIS — N184 Chronic kidney disease, stage 4 (severe): Secondary | ICD-10-CM | POA: Diagnosis not present

## 2022-10-26 DIAGNOSIS — I10 Essential (primary) hypertension: Secondary | ICD-10-CM

## 2022-10-26 DIAGNOSIS — I272 Pulmonary hypertension, unspecified: Secondary | ICD-10-CM | POA: Diagnosis not present

## 2022-10-26 DIAGNOSIS — E782 Mixed hyperlipidemia: Secondary | ICD-10-CM

## 2022-10-26 DIAGNOSIS — R413 Other amnesia: Secondary | ICD-10-CM

## 2022-10-26 DIAGNOSIS — Z23 Encounter for immunization: Secondary | ICD-10-CM

## 2022-10-26 DIAGNOSIS — E875 Hyperkalemia: Secondary | ICD-10-CM

## 2022-10-26 DIAGNOSIS — F5101 Primary insomnia: Secondary | ICD-10-CM

## 2022-10-26 DIAGNOSIS — L03115 Cellulitis of right lower limb: Secondary | ICD-10-CM | POA: Insufficient documentation

## 2022-10-26 MED ORDER — ALPRAZOLAM 1 MG PO TABS: ORAL_TABLET | ORAL | 2 refills | Status: DC

## 2022-10-26 MED ORDER — DOXYCYCLINE HYCLATE 100 MG PO CAPS: 100.0000 mg | ORAL_CAPSULE | Freq: Two times a day (BID) | ORAL | 0 refills | Status: DC

## 2022-10-26 MED ORDER — MUPIROCIN 2 % EX OINT: 1.0000 | TOPICAL_OINTMENT | Freq: Two times a day (BID) | CUTANEOUS | 0 refills | Status: DC

## 2022-10-26 NOTE — Assessment & Plan Note (Signed)
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  Continue collaboration with neurology and current medication regimen for prevention.  Labs up to date.  Close monitoring.

## 2022-10-26 NOTE — Assessment & Plan Note (Signed)
Noted to upper and lower extremities.  Recommended to patient gentle cleansing daily with soap and water + application of lotion.  Monitor closely for skin breakdown or wounds, immediately notify provider if present. 

## 2022-10-26 NOTE — Assessment & Plan Note (Signed)
Chronic, ongoing.  Followed by cardiology, continue current medication regimen as prescribed by them and collaboration.  Recent notes reviewed.

## 2022-10-26 NOTE — Assessment & Plan Note (Signed)
Recent level improved with medication changes. Continue to monitor.

## 2022-10-26 NOTE — Assessment & Plan Note (Signed)
Noted on past diagnoses, 6CIT 14 April, difficulty with recall.  No current memory medications, may benefit from this in future.  Continue collaboration with neurology.  CCM team involved.  May benefit imaging in future.  Have requested she bring a family member or friend to visit, but she reports they all work and are unable to attend.  This may be beneficial to have at visits though to help her with recall.

## 2022-10-26 NOTE — Assessment & Plan Note (Signed)
Chronic, ongoing.  Continue current medication regimen and adjust as needed.  Labs up to date. 

## 2022-10-26 NOTE — Assessment & Plan Note (Signed)
Chronic, ongoing for years.  Previous PCP, Dr. Arlana Pouch, placed her on Xanax many years ago and she still takes, reports she cannot sleep without it.  At this time will continue current regimen and adjust if possible.  Aware of need for every 3 month visits + a controlled substance agreement at next visit and UDS up to date (due next 08/04/23).  Return in 3 months.

## 2022-10-26 NOTE — Assessment & Plan Note (Signed)
Chronic, ongoing.  BP at goal for age today.  Recommend she monitor BP at least a few mornings a week at home and document.  DASH diet at home.  Continue Torsemide only.  Collaboration with cardiology - recent note reviewed and medication changes.  Labs today: up to date.  Urine ALB 25 June 2022.

## 2022-10-26 NOTE — Assessment & Plan Note (Signed)
Chronic, ongoing.  Is being followed by nephrology, has recent visit.  Currently CKD 4.  Educated her on diet and fluid intake + current medications. Urine ALB 25 June 2022.  Recommend continue all current medications and wear compression hose at home on during day and off at night.

## 2022-10-26 NOTE — Assessment & Plan Note (Addendum)
Acute, is taking Doxycycline.  Will extend this for 4 more days + send in Mupirocin to place on wound area.  Td vaccine updated today.  Recommend she monitor area closely and if any worsening symptoms immediately alert provider.  Monitor temperature at home.  Return in one week.

## 2022-10-26 NOTE — Progress Notes (Signed)
BP 128/70 (BP Location: Left Arm, Patient Position: Sitting, Cuff Size: Normal)   Pulse 73   Temp 97.6 F (36.4 C) (Oral)   Ht 5' 0.98" (1.549 m)   Wt 153 lb 9.6 oz (69.7 kg)   SpO2 97%   BMI 29.04 kg/m    Subjective:    Patient ID: Tracie Fisher, female    DOB: [birth year], 87 y.o.   MRN: [id number]  HPI: Tracie Fisher is a 87 y.o. female  Chief Complaint  Patient presents with   Insomnia   Memory Changes   Hypertension    Has not taken medication today   Hyperlipidemia   Open Wound    Right lower leg, about a week ago with redness and swelling beneath bandage    INSOMNIA Has been on Xanax for many years, Dr. Arlana Pouch her previous PCP started years ago. She wishes to remain on it -- takes 1/2 tablet Xanax in morning and 1 tablet at night.  Pt is aware of risks of psychoactive medication use to include increased sedation, respiratory suppression, falls, extrapyramidal movements, dependence and cardiovascular events.  Pt would like to continue treatment as benefit determined to outweigh risk.  Last filled on 10/24/22. Duration: chronic Satisfied with sleep quality: yes Difficulty falling asleep: yes Difficulty staying asleep: no Waking a few hours after sleep onset: no Early morning awakenings: no Daytime hypersomnolence: no Wakes feeling refreshed: no Good sleep hygiene: yes Apnea: no Snoring: no Depressed/anxious mood: no Recent stress: no Restless legs/nocturnal leg cramps: no Chronic pain/arthritis: yes History of sleep study: no Treatments attempted:  Xanax and benadryl    SKIN INFECTION Has skin tear to lower right leg.  Was seen in urgent care for this on 10/19/22 and treated with Doxycycline BID + applied steri strips. Hit her shin on a plastic piece passenger seat of car after leaving laundry mat.  She has one dose of abx left. Duration: days Location: right lower leg History of trauma in area: yes Pain:  mild when moves Quality: yes Severity:  mild Redness: yes Swelling: yes Oozing: a small amount of blood Pus: no Fevers: no Nausea/vomiting: no Status: stable Treatments attempted:antibiotics  Tetanus: Not UTD -- will provide today  CHRONIC KIDNEY DISEASE Had initial visit with Dr. Thedore Mins, nephrology, on 09/26/22 -- returns on 03/29/23.  Labs CRT 1.4 and eGFR 37 on recent labs.  He recommends to reduce Spironolactone to 3 times a week if ongoing elevation in K+. CKD status: stable Medications renally dose: yes Previous renal evaluation: yes Pneumovax:  Not up to Date Influenza Vaccine:  Up to Date    HYPERTENSION / HYPERLIPIDEMIA Taking Torsemide, Plavix, and Pravastatin. Moderate pulmonary hypertension. Follows with cardiology, last saw Dr. Juliann Pares 10/21/22, last echo March 2024 65-70%, Grade diastolic I dysfunction. There is mildly elevated pulmonary artery systolic pressure.  Mild MR.  At visit with cardiology 10/13/2022 they stopped Spironolactone and Losartan due to hyperkalemia and reduce renal function.  To continue Torsemide.  She reports she was told to stop Metoprolol recent visit and Lasix.   History of stroke years ago and then recent acute stroke on 08/04/22. Last neurology visit 09/27/22. Satisfied with current treatment? yes Duration of hypertension: chronic BP monitoring frequency: rarely BP range:  BP medication side effects: no Duration of hyperlipidemia: chronic Cholesterol medication side effects: no Cholesterol supplements: none Medication compliance: good compliance Aspirin: no Recent stressors: no Recurrent headaches: no Visual changes: no Palpitations: no Dyspnea: has improved with medication changes Chest pain:  no Lower extremity edema: occasional Dizzy/lightheaded: no     09/13/2022   11:20 AM 07/15/2022   11:31 AM  6CIT Screen  What Year? 0 points 0 points  What month? 0 points 0 points  What time? 0 points 0 points  Count back from 20 4 points 0 points  Months in reverse 4 points 4 points   Repeat phrase 6 points 8 points  Total Score 14 points 12 points    Relevant past medical, surgical, family and social history reviewed and updated as indicated. Interim medical history since our last visit reviewed. Allergies and medications reviewed and updated.  Review of Systems  Constitutional:  Negative for activity change, appetite change, diaphoresis, fatigue and fever.  Respiratory:  Negative for cough, chest tightness and shortness of breath.   Cardiovascular:  Positive for leg swelling. Negative for chest pain and palpitations.  Gastrointestinal: Negative.   Skin:  Positive for wound.  Neurological: Negative.   Psychiatric/Behavioral:  Negative for decreased concentration, self-injury, sleep disturbance and suicidal ideas.     Per HPI unless specifically indicated above     Objective:    BP 128/70 (BP Location: Left Arm, Patient Position: Sitting, Cuff Size: Normal)   Pulse 73   Temp 97.6 F (36.4 C) (Oral)   Ht 5' 0.98" (1.549 m)   Wt 153 lb 9.6 oz (69.7 kg)   SpO2 97%   BMI 29.04 kg/m   Wt Readings from Last 3 Encounters:  10/26/22 153 lb 9.6 oz (69.7 kg)  09/13/22 156 lb 6.4 oz (70.9 kg)  08/26/22 158 lb 8 oz (71.9 kg)    Physical Exam Vitals and nursing note reviewed.  Constitutional:      General: She is awake. She is not in acute distress.    Appearance: She is well-developed and well-groomed. She is not ill-appearing or toxic-appearing.  HENT:     Head: Normocephalic.     Right Ear: Hearing and external ear normal.     Left Ear: Hearing and external ear normal.  Eyes:     General: Lids are normal.        Right eye: No discharge.        Left eye: No discharge.     Conjunctiva/sclera: Conjunctivae normal.     Pupils: Pupils are equal, round, and reactive to light.  Neck:     Thyroid: No thyromegaly.     Vascular: No carotid bruit.  Cardiovascular:     Rate and Rhythm: Normal rate and regular rhythm.     Heart sounds: Normal heart sounds. No  murmur heard.    No gallop.  Pulmonary:     Effort: Pulmonary effort is normal. No accessory muscle usage or respiratory distress.     Breath sounds: Normal breath sounds.  Abdominal:     General: Bowel sounds are normal. There is no distension.     Palpations: Abdomen is soft.     Tenderness: There is no abdominal tenderness.  Musculoskeletal:     Cervical back: Normal range of motion and neck supple.     Right lower leg: No edema.     Left lower leg: No edema.  Lymphadenopathy:     Cervical: No cervical adenopathy.  Skin:    General: Skin is warm and dry.     Findings: Wound present.          Comments: Multiple scattered bruises to BUE.  Neurological:     Mental Status: She is alert and oriented to person,  place, and time.     Deep Tendon Reflexes: Reflexes are normal and symmetric.     Reflex Scores:      Brachioradialis reflexes are 2+ on the right side and 2+ on the left side.      Patellar reflexes are 2+ on the right side and 2+ on the left side.    Comments: Oriented to location, date, time, and provider today.  Psychiatric:        Attention and Perception: Attention normal.        Mood and Affect: Mood normal.        Speech: Speech normal.        Behavior: Behavior normal. Behavior is cooperative.        Thought Content: Thought content normal.     Results for orders placed or performed in visit on 08/12/22  CBC with Differential/Platelet  Result Value Ref Range   WBC 6.2 3.4 - 10.8 x10E3/uL   RBC 3.53 (L) 3.77 - 5.28 x10E6/uL   Hemoglobin 10.8 (L) 11.1 - 15.9 g/dL   Hematocrit 40.9 81.1 - 46.6 %   MCV 98 (H) 79 - 97 fL   MCH 30.6 26.6 - 33.0 pg   MCHC 31.4 (L) 31.5 - 35.7 g/dL   RDW 91.4 78.2 - 95.6 %   Platelets 231 150 - 450 x10E3/uL   Neutrophils 55 Not Estab. %   Lymphs 28 Not Estab. %   Monocytes 12 Not Estab. %   Eos 4 Not Estab. %   Basos 1 Not Estab. %   Neutrophils Absolute 3.4 1.4 - 7.0 x10E3/uL   Lymphocytes Absolute 1.7 0.7 - 3.1 x10E3/uL    Monocytes Absolute 0.8 0.1 - 0.9 x10E3/uL   EOS (ABSOLUTE) 0.3 0.0 - 0.4 x10E3/uL   Basophils Absolute 0.0 0.0 - 0.2 x10E3/uL   Immature Granulocytes 0 Not Estab. %   Immature Grans (Abs) 0.0 0.0 - 0.1 x10E3/uL  Comprehensive metabolic panel  Result Value Ref Range   Glucose 85 70 - 99 mg/dL   BUN 48 (H) 8 - 27 mg/dL   Creatinine, Ser 2.13 (H) 0.57 - 1.00 mg/dL   eGFR 27 (L) >08 MV/HQI/6.96   BUN/Creatinine Ratio 27 12 - 28   Sodium 135 134 - 144 mmol/L   Potassium 5.0 3.5 - 5.2 mmol/L   Chloride 99 96 - 106 mmol/L   CO2 21 20 - 29 mmol/L   Calcium 9.2 8.7 - 10.3 mg/dL   Total Protein 5.9 (L) 6.0 - 8.5 g/dL   Albumin 4.1 3.7 - 4.7 g/dL   Globulin, Total 1.8 1.5 - 4.5 g/dL   Albumin/Globulin Ratio 2.3 (H) 1.2 - 2.2   Bilirubin Total 0.3 0.0 - 1.2 mg/dL   Alkaline Phosphatase 76 44 - 121 IU/L   AST 21 0 - 40 IU/L   ALT 14 0 - 32 IU/L  TSH  Result Value Ref Range   TSH 1.050 0.450 - 4.500 uIU/mL  T4, free  Result Value Ref Range   Free T4 1.45 0.82 - 1.77 ng/dL      Assessment & Plan:   Problem List Items Addressed This Visit       Cardiovascular and Mediastinum   Essential hypertension    Chronic, ongoing.  BP at goal for age today.  Recommend she monitor BP at least a few mornings a week at home and document.  DASH diet at home.  Continue Torsemide only.  Collaboration with cardiology - recent note reviewed and medication changes.  Labs today: up to date.  Urine ALB 25 June 2022.         Relevant Medications   torsemide (DEMADEX) 20 MG tablet   Moderate pulmonary hypertension (HCC)    Chronic, ongoing.  Followed by cardiology, continue current medication regimen as prescribed by them and collaboration.  Recent notes reviewed.      Relevant Medications   torsemide (DEMADEX) 20 MG tablet   Senile purpura (HCC)    Noted to upper and lower extremities.  Recommended to patient gentle cleansing daily with soap and water + application of lotion.  Monitor closely  for skin breakdown or wounds, immediately notify provider if present.      Relevant Medications   torsemide (DEMADEX) 20 MG tablet     Genitourinary   CKD (chronic kidney disease) stage 4, GFR 15-29 ml/min (HCC) - Primary    Chronic, ongoing.  Is being followed by nephrology, has recent visit.  Currently CKD 4.  Educated her on diet and fluid intake + current medications. Urine ALB 25 June 2022.  Recommend continue all current medications and wear compression hose at home on during day and off at night.        Other   Hyperkalemia (Chronic)    Recent level improved with medication changes. Continue to monitor.      Cellulitis of right lower extremity    Acute, is taking Doxycycline.  Will extend this for 4 more days + send in Mupirocin to place on wound area.  Td vaccine updated today.  Recommend she monitor area closely and if any worsening symptoms immediately alert provider.  Monitor temperature at home.  Return in one week.      History of stroke    History of CVA, most recent on 08/04/22.  Continue collaboration with neurology and current medication regimen for prevention.  Labs up to date.  Close monitoring.      Insomnia    Chronic, ongoing for years.  Previous PCP, Dr. Arlana Pouch, placed her on Xanax many years ago and she still takes, reports she cannot sleep without it.  At this time will continue current regimen and adjust if possible.  Aware of need for every 3 month visits + a controlled substance agreement at next visit and UDS up to date (due next 08/04/23).  Return in 3 months.      Relevant Medications   ALPRAZolam (XANAX) 1 MG tablet (Start on 11/22/2022)   Loss of memory    Noted on past diagnoses, 6CIT 14 April, difficulty with recall.  No current memory medications, may benefit from this in future.  Continue collaboration with neurology.  CCM team involved.  May benefit imaging in future.  Have requested she bring a family member or friend to visit, but she reports they  all work and are unable to attend.  This may be beneficial to have at visits though to help her with recall.      Mixed hyperlipidemia    Chronic, ongoing.  Continue current medication regimen and adjust as needed.  Labs up to date.      Relevant Medications   torsemide (DEMADEX) 20 MG tablet   Other Visit Diagnoses     Need for Td vaccine       Td vaccine in office today, educated patient.   Relevant Orders   Td vaccine greater than or equal to 7yo preservative free IM (Completed)        Follow up plan: Return in about 1 week (  around 11/02/2022) for WOUND CHECK.

## 2022-10-30 NOTE — Patient Instructions (Incomplete)
Cellulitis, Adult  Cellulitis is a skin infection. The infected area is often warm, red, swollen, and sore. It occurs most often on the legs, feet, and toes, but can happen on any part of the body. This condition can be life-threatening without treatment. It is very important to get treated right away. What are the causes? This condition is caused by bacteria. The bacteria enter through a break in the skin, such as: A cut. A burn. A bug bite. An animal bite. An open sore. A crack. What increases the risk? Having a weak body's defense system (immune system). Being older than 85 years old. Having a blood sugar problem (diabetes). Having a long-term liver disease (cirrhosis) or kidney disease. Being very overweight (obese). Having a skin problem, such as: An itchy rash. A rash caused by a fungus. A rash with blisters. Slow movement of blood in the veins (venous stasis). Fluid buildup below the skin (edema). This condition is more likely to occur in people who: Have open cuts, burns, bites, or scrapes on the skin. Have been treated with high-energy rays (radiation). Use IV drugs. What are the signs or symptoms? Skin that: Looks red or purple, or slightly darker than your usual skin color. Has streaks. Has spots. Is swollen. Is sore or painful when you touch it. Is warm. A fever. Chills. Blisters. Tiredness (fatigue). How is this treated? Medicines to treat infections or allergies. Rest. Placing cold or warm cloths on the skin. Staying in the hospital, if the condition is very bad. You may need medicines through an IV. Follow these instructions at home: Medicines Take over-the-counter and prescription medicines only as told by your doctor. If you were prescribed antibiotics, take them as told by your doctor. Do not stop using them even if you start to feel better. General instructions Drink enough fluid to keep your pee (urine) pale yellow. Do not touch or rub the  infected area. Raise (elevate) the infected area above the level of your heart while you are sitting or lying down. Return to your normal activities when your doctor says that it is safe. Place cold or warm cloths on the area as told by your doctor. Keep all follow-up visits. Your doctor will need to make sure that a more serious infection is not developing. Contact a doctor if: You have a fever. You do not start to get better after 1-2 days of treatment. Your bone or joint under the infected area starts to hurt after the skin has healed. Your infection comes back in the same area or another area. Signs of this may include: You have a swollen bump in the area. Your red area gets larger, turns dark in color, or hurts more. You have more fluid coming from the wound. Pus or a bad smell develops in your infected area. You have more pain. You feel sick and have muscle aches and weakness. You develop vomiting or watery poop that will not go away. Get help right away if: You see red streaks coming from the area. You notice the skin turns purple or black and falls off. These symptoms may be an emergency. Get help right away. Call 911. Do not wait to see if the symptoms will go away. Do not drive yourself to the hospital. This information is not intended to replace advice given to you by your health care provider. Make sure you discuss any questions you have with your health care provider. Document Revised: 12/28/2021 Document Reviewed: 12/28/2021 Elsevier Patient Education    2024 Elsevier Inc.  

## 2022-11-03 ENCOUNTER — Ambulatory Visit (INDEPENDENT_AMBULATORY_CARE_PROVIDER_SITE_OTHER): Payer: 59 | Admitting: Nurse Practitioner

## 2022-11-03 ENCOUNTER — Encounter: Payer: Self-pay | Admitting: Nurse Practitioner

## 2022-11-03 VITALS — BP 136/81 | HR 78 | Temp 97.6°F | Ht 60.98 in | Wt 155.0 lb

## 2022-11-03 DIAGNOSIS — L03115 Cellulitis of right lower limb: Secondary | ICD-10-CM | POA: Diagnosis not present

## 2022-11-03 NOTE — Progress Notes (Signed)
BP 136/81   Pulse 78   Temp 97.6 F (36.4 C) (Oral)   Ht 5' 0.98" (1.549 m)   Wt 155 lb (70.3 kg)   SpO2 98%   BMI 29.30 kg/m    Subjective:    Patient ID: Tracie Fisher, female    DOB: 1936-01-05, 87 y.o.   MRN: [id number]  HPI: Tracie Fisher is a 87 y.o. female  Chief Complaint  Patient presents with   Wound Check    Right lower leg   SKIN INFECTION Follow-up for wound check, initially seen one week ago and extended Doxycycline which she has completed.  Has skin tear to lower right leg.  Saw urgent care for this on 10/19/22 and treated with Doxycycline BID + applied steri strips. Hit her shin on a plastic piece passenger seat of car after leaving laundry mat.   Duration: days Location: right lower leg History of trauma in area: yes Pain: none Redness: improving Swelling: improving Oozing: a scant amount Pus: no Fevers: no Nausea/vomiting: no Status: some improvement Treatments attempted:antibiotics  Tetanus: UTD  Relevant past medical, surgical, family and social history reviewed and updated as indicated. Interim medical history since our last visit reviewed. Allergies and medications reviewed and updated.  Review of Systems  Constitutional:  Negative for activity change, appetite change, diaphoresis, fatigue and fever.  Respiratory:  Negative for cough, chest tightness and shortness of breath.   Cardiovascular:  Positive for leg swelling. Negative for chest pain and palpitations.  Gastrointestinal: Negative.   Skin:  Positive for wound.  Neurological: Negative.   Psychiatric/Behavioral:  Negative for decreased concentration, self-injury, sleep disturbance and suicidal ideas.    Per HPI unless specifically indicated above     Objective:    BP 136/81   Pulse 78   Temp 97.6 F (36.4 C) (Oral)   Ht 5' 0.98" (1.549 m)   Wt 155 lb (70.3 kg)   SpO2 98%   BMI 29.30 kg/m   Wt Readings from Last 3 Encounters:  11/03/22 155 lb (70.3 kg)  10/26/22 153 lb  9.6 oz (69.7 kg)  09/13/22 156 lb 6.4 oz (70.9 kg)    Physical Exam Vitals and nursing note reviewed.  Constitutional:      General: She is awake. She is not in acute distress.    Appearance: She is well-developed and well-groomed. She is not ill-appearing or toxic-appearing.  HENT:     Head: Normocephalic.     Right Ear: Hearing and external ear normal.     Left Ear: Hearing and external ear normal.  Eyes:     General: Lids are normal.        Right eye: No discharge.        Left eye: No discharge.     Conjunctiva/sclera: Conjunctivae normal.     Pupils: Pupils are equal, round, and reactive to light.  Neck:     Thyroid: No thyromegaly.     Vascular: No carotid bruit.  Cardiovascular:     Rate and Rhythm: Normal rate and regular rhythm.     Heart sounds: Normal heart sounds. No murmur heard.    No gallop.  Pulmonary:     Effort: Pulmonary effort is normal. No accessory muscle usage or respiratory distress.     Breath sounds: Normal breath sounds.  Abdominal:     General: Bowel sounds are normal. There is no distension.     Palpations: Abdomen is soft.     Tenderness: There is no  abdominal tenderness.  Musculoskeletal:     Cervical back: Normal range of motion and neck supple.     Right lower leg: No edema.     Left lower leg: No edema.  Lymphadenopathy:     Cervical: No cervical adenopathy.  Skin:    General: Skin is warm and dry.     Findings: Wound present.          Comments: Multiple scattered bruises to BUE.  Neurological:     Mental Status: She is alert and oriented to person, place, and time.     Deep Tendon Reflexes: Reflexes are normal and symmetric.     Reflex Scores:      Brachioradialis reflexes are 2+ on the right side and 2+ on the left side.      Patellar reflexes are 2+ on the right side and 2+ on the left side.    Comments: Oriented to location, date, time, and provider today.  Psychiatric:        Attention and Perception: Attention normal.         Mood and Affect: Mood normal.        Speech: Speech normal.        Behavior: Behavior normal. Behavior is cooperative.        Thought Content: Thought content normal.     Results for orders placed or performed in visit on 08/12/22  CBC with Differential/Platelet  Result Value Ref Range   WBC 6.2 3.4 - 10.8 x10E3/uL   RBC 3.53 (L) 3.77 - 5.28 x10E6/uL   Hemoglobin 10.8 (L) 11.1 - 15.9 g/dL   Hematocrit 16.1 09.6 - 46.6 %   MCV 98 (H) 79 - 97 fL   MCH 30.6 26.6 - 33.0 pg   MCHC 31.4 (L) 31.5 - 35.7 g/dL   RDW 04.5 40.9 - 81.1 %   Platelets 231 150 - 450 x10E3/uL   Neutrophils 55 Not Estab. %   Lymphs 28 Not Estab. %   Monocytes 12 Not Estab. %   Eos 4 Not Estab. %   Basos 1 Not Estab. %   Neutrophils Absolute 3.4 1.4 - 7.0 x10E3/uL   Lymphocytes Absolute 1.7 0.7 - 3.1 x10E3/uL   Monocytes Absolute 0.8 0.1 - 0.9 x10E3/uL   EOS (ABSOLUTE) 0.3 0.0 - 0.4 x10E3/uL   Basophils Absolute 0.0 0.0 - 0.2 x10E3/uL   Immature Granulocytes 0 Not Estab. %   Immature Grans (Abs) 0.0 0.0 - 0.1 x10E3/uL  Comprehensive metabolic panel  Result Value Ref Range   Glucose 85 70 - 99 mg/dL   BUN 48 (H) 8 - 27 mg/dL   Creatinine, Ser 9.14 (H) 0.57 - 1.00 mg/dL   eGFR 27 (L) >78 GN/FAO/1.30   BUN/Creatinine Ratio 27 12 - 28   Sodium 135 134 - 144 mmol/L   Potassium 5.0 3.5 - 5.2 mmol/L   Chloride 99 96 - 106 mmol/L   CO2 21 20 - 29 mmol/L   Calcium 9.2 8.7 - 10.3 mg/dL   Total Protein 5.9 (L) 6.0 - 8.5 g/dL   Albumin 4.1 3.7 - 4.7 g/dL   Globulin, Total 1.8 1.5 - 4.5 g/dL   Albumin/Globulin Ratio 2.3 (H) 1.2 - 2.2   Bilirubin Total 0.3 0.0 - 1.2 mg/dL   Alkaline Phosphatase 76 44 - 121 IU/L   AST 21 0 - 40 IU/L   ALT 14 0 - 32 IU/L  TSH  Result Value Ref Range   TSH 1.050 0.450 - 4.500 uIU/mL  T4, free  Result Value Ref Range   Free T4 1.45 0.82 - 1.77 ng/dL      Assessment & Plan:   Problem List Items Addressed This Visit       Other   Cellulitis of right lower extremity -  Primary    Acute, improved.  Continue Mupirocin to place on wound area.  Td vaccine up to date. Recommend she monitor area closely and if any worsening symptoms immediately alert provider.  Monitor temperature at home.  Return as needed.  At this time area is healing and well approximated.        Follow up plan: Return in about 3 months (around 02/03/2023) for HTN/HLD, ANXIETY/INSOMNIA, MEMORY CHECK.

## 2022-11-03 NOTE — Assessment & Plan Note (Signed)
Acute, improved.  Continue Mupirocin to place on wound area.  Td vaccine up to date. Recommend she monitor area closely and if any worsening symptoms immediately alert provider.  Monitor temperature at home.  Return as needed.  At this time area is healing and well approximated.

## 2022-11-09 NOTE — Patient Instructions (Signed)

## 2022-11-10 ENCOUNTER — Encounter: Payer: Self-pay | Admitting: Nurse Practitioner

## 2022-11-10 ENCOUNTER — Ambulatory Visit (INDEPENDENT_AMBULATORY_CARE_PROVIDER_SITE_OTHER): Payer: 59 | Admitting: Nurse Practitioner

## 2022-11-10 VITALS — BP 120/71 | HR 79 | Temp 98.0°F | Wt 157.2 lb

## 2022-11-10 DIAGNOSIS — L03115 Cellulitis of right lower limb: Secondary | ICD-10-CM

## 2022-11-10 MED ORDER — DOXYCYCLINE HYCLATE 100 MG PO TABS: 100.0000 mg | ORAL_TABLET | Freq: Two times a day (BID) | ORAL | 0 refills | Status: AC

## 2022-11-10 NOTE — Progress Notes (Signed)
BP 120/71   Pulse 79   Temp 98 F (36.7 C) (Oral)   Wt 157 lb 3.2 oz (71.3 kg)   SpO2 98%   BMI 29.72 kg/m    Subjective:    Patient ID: Tracie Fisher, female    DOB: Nov 24, 1935, 87 y.o.   MRN: [id number]  HPI: Tracie Fisher is a 87 y.o. female  Chief Complaint  Patient presents with   Leg Injury    Pt states she hit her R leg on a piece in a car over a week ago. States she feels like there is still some infection.   SKIN INFECTION Follow-up for wound check, initially seen on 10/26/22 for this and extended Doxycycline which she has completed.  Has skin tear to lower right leg.  Saw urgent care for this on 10/19/22 and treated with Doxycycline BID + applied steri strips. Hit her shin on a plastic piece passenger seat of car after leaving laundry mat.  She is concerned about some drainage present to area.  Overall swelling and redness is better.  Current CrCl 33 based on recent labs. Duration: days Location: right lower leg History of trauma in area: yes Pain: none Redness: improving Swelling: improving Oozing: a scant amount Pus: no Fevers: no Nausea/vomiting: no Status: some improvement Treatments attempted:antibiotics  Tetanus: UTD  Relevant past medical, surgical, family and social history reviewed and updated as indicated. Interim medical history since our last visit reviewed. Allergies and medications reviewed and updated.  Review of Systems  Constitutional:  Negative for activity change, appetite change, diaphoresis, fatigue and fever.  Respiratory:  Negative for cough, chest tightness and shortness of breath.   Cardiovascular:  Positive for leg swelling. Negative for chest pain and palpitations.  Gastrointestinal: Negative.   Skin:  Positive for wound.  Neurological: Negative.   Psychiatric/Behavioral:  Negative for decreased concentration, self-injury, sleep disturbance and suicidal ideas.    Per HPI unless specifically indicated above     Objective:     BP 120/71   Pulse 79   Temp 98 F (36.7 C) (Oral)   Wt 157 lb 3.2 oz (71.3 kg)   SpO2 98%   BMI 29.72 kg/m   Wt Readings from Last 3 Encounters:  11/10/22 157 lb 3.2 oz (71.3 kg)  11/03/22 155 lb (70.3 kg)  10/26/22 153 lb 9.6 oz (69.7 kg)    Physical Exam Vitals and nursing note reviewed.  Constitutional:      General: She is awake. She is not in acute distress.    Appearance: She is well-developed and well-groomed. She is not ill-appearing or toxic-appearing.  HENT:     Head: Normocephalic.     Right Ear: Hearing and external ear normal.     Left Ear: Hearing and external ear normal.  Eyes:     General: Lids are normal.        Right eye: No discharge.        Left eye: No discharge.     Conjunctiva/sclera: Conjunctivae normal.     Pupils: Pupils are equal, round, and reactive to light.  Neck:     Thyroid: No thyromegaly.     Vascular: No carotid bruit.  Cardiovascular:     Rate and Rhythm: Normal rate and regular rhythm.     Heart sounds: Normal heart sounds. No murmur heard.    No gallop.  Pulmonary:     Effort: Pulmonary effort is normal. No accessory muscle usage or respiratory distress.  Breath sounds: Normal breath sounds.  Abdominal:     General: Bowel sounds are normal. There is no distension.     Palpations: Abdomen is soft.     Tenderness: There is no abdominal tenderness.  Musculoskeletal:     Cervical back: Normal range of motion and neck supple.     Right lower leg: No edema.     Left lower leg: No edema.  Lymphadenopathy:     Cervical: No cervical adenopathy.  Skin:    General: Skin is warm and dry.     Findings: Wound present.          Comments: Multiple scattered bruises to BUE.  Neurological:     Mental Status: She is alert and oriented to person, place, and time.     Deep Tendon Reflexes: Reflexes are normal and symmetric.     Reflex Scores:      Brachioradialis reflexes are 2+ on the right side and 2+ on the left side.      Patellar  reflexes are 2+ on the right side and 2+ on the left side.    Comments: Oriented to location, date, time, and provider today.  Psychiatric:        Attention and Perception: Attention normal.        Mood and Affect: Mood normal.        Speech: Speech normal.        Behavior: Behavior normal. Behavior is cooperative.        Thought Content: Thought content normal.     Results for orders placed or performed in visit on 08/12/22  CBC with Differential/Platelet  Result Value Ref Range   WBC 6.2 3.4 - 10.8 x10E3/uL   RBC 3.53 (L) 3.77 - 5.28 x10E6/uL   Hemoglobin 10.8 (L) 11.1 - 15.9 g/dL   Hematocrit 40.9 81.1 - 46.6 %   MCV 98 (H) 79 - 97 fL   MCH 30.6 26.6 - 33.0 pg   MCHC 31.4 (L) 31.5 - 35.7 g/dL   RDW 91.4 78.2 - 95.6 %   Platelets 231 150 - 450 x10E3/uL   Neutrophils 55 Not Estab. %   Lymphs 28 Not Estab. %   Monocytes 12 Not Estab. %   Eos 4 Not Estab. %   Basos 1 Not Estab. %   Neutrophils Absolute 3.4 1.4 - 7.0 x10E3/uL   Lymphocytes Absolute 1.7 0.7 - 3.1 x10E3/uL   Monocytes Absolute 0.8 0.1 - 0.9 x10E3/uL   EOS (ABSOLUTE) 0.3 0.0 - 0.4 x10E3/uL   Basophils Absolute 0.0 0.0 - 0.2 x10E3/uL   Immature Granulocytes 0 Not Estab. %   Immature Grans (Abs) 0.0 0.0 - 0.1 x10E3/uL  Comprehensive metabolic panel  Result Value Ref Range   Glucose 85 70 - 99 mg/dL   BUN 48 (H) 8 - 27 mg/dL   Creatinine, Ser 2.13 (H) 0.57 - 1.00 mg/dL   eGFR 27 (L) >08 MV/HQI/6.96   BUN/Creatinine Ratio 27 12 - 28   Sodium 135 134 - 144 mmol/L   Potassium 5.0 3.5 - 5.2 mmol/L   Chloride 99 96 - 106 mmol/L   CO2 21 20 - 29 mmol/L   Calcium 9.2 8.7 - 10.3 mg/dL   Total Protein 5.9 (L) 6.0 - 8.5 g/dL   Albumin 4.1 3.7 - 4.7 g/dL   Globulin, Total 1.8 1.5 - 4.5 g/dL   Albumin/Globulin Ratio 2.3 (H) 1.2 - 2.2   Bilirubin Total 0.3 0.0 - 1.2 mg/dL   Alkaline Phosphatase 76  44 - 121 IU/L   AST 21 0 - 40 IU/L   ALT 14 0 - 32 IU/L  TSH  Result Value Ref Range   TSH 1.050 0.450 - 4.500 uIU/mL   T4, free  Result Value Ref Range   Free T4 1.45 0.82 - 1.77 ng/dL      Assessment & Plan:   Problem List Items Addressed This Visit       Other   Cellulitis of right lower extremity - Primary    Acute, improving.  Continue Mupirocin to place on wound area.  Td vaccine up to date. Start Doxycycline for another round BID for 5 days, recommend taking with probiotic.  Recommend she monitor area closely and if any worsening symptoms immediately alert provider.  Monitor temperature at home.  Return in one week.        Follow up plan: Return in about 1 week (around 11/17/2022) for Wound Check.

## 2022-11-10 NOTE — Assessment & Plan Note (Signed)
Acute, improving.  Continue Mupirocin to place on wound area.  Td vaccine up to date. Start Doxycycline for another round BID for 5 days, recommend taking with probiotic.  Recommend she monitor area closely and if any worsening symptoms immediately alert provider.  Monitor temperature at home.  Return in one week.

## 2022-11-14 ENCOUNTER — Ambulatory Visit: Payer: Self-pay

## 2022-11-14 ENCOUNTER — Other Ambulatory Visit: Payer: Self-pay | Admitting: *Deleted

## 2022-11-14 NOTE — Telephone Encounter (Signed)
Summary: medication question   Pt called saying she stopped the antibiotic she was given last week because it gave her diarrhea.           Chief Complaint: diarrhea after taking antibiotics Symptoms: started doxycyline 2 x day 11/10/22 and 11/12/22 started with diarrhea. 6-8 times a day and incontinent at times and during sleep. Reports taking 5 tablets of medication and then stopped antibiotics. Loose bms. Drinking water . Temp 97.7 Frequency: Saturday 6.29/24 Pertinent Negatives: Patient denies dizziness, no fever, no blood in stool no watery stools  Disposition: [] ED /[] Urgent Care (no appt availability in office) / [] Appointment(In office/virtual)/ []  [facility]/ [] Home Care/ [] Refused Recommended Disposition /[] [facility]/ [x]  Follow-up with PCP Additional Notes:   No available OV within 24 hours with any provider. Patient does not want to come in for OV due to incontinent episodes of diarrhea. Will do what PCP recommends. Please advise. Patient would also like refill of Bactroban ointment .      Reason for Disposition  MODERATE diarrhea (e.g., 4-6 times / day more than normal)  Answer Assessment - Initial Assessment Questions 1. ANTIBIOTIC: "What antibiotic are you taking?" "How many times per day?"     Doxycyline 2 times day 2. ANTIBIOTIC ONSET: "When was the antibiotic started?"     11/10/22 3. DIARRHEA SEVERITY: "How bad is the diarrhea?" "How many more stools have you had in the past 24 hours than normal?"    - NO DIARRHEA (SCALE 0)   - MILD (SCALE 1-3): Few loose or mushy BMs; increase of 1-3 stools over normal daily number of stools; mild increase in ostomy output.   -  MODERATE (SCALE 4-7): Increase of 4-6 stools daily over normal; moderate increase in ostomy output. * SEVERE (SCALE 8-10; OR 'WORST POSSIBLE'): Increase of 7 or more stools daily over normal; moderate increase in ostomy output; incontinence.     Incontinent last night while asleep and  possible 6-8 times a  day 4. ONSET: "When did the diarrhea begin?"      Saturday 11/12/22 5. BM CONSISTENCY: "How loose or watery is the diarrhea?"      Loose  6. VOMITING: "Are you also vomiting?" If Yes, ask: "How many times in the past 24 hours?"      no 7. ABDOMEN PAIN: "Are you having any abdomen pain?" If Yes, ask: "What does it feel like?" (e.g., crampy, dull, intermittent, constant)      no 8. ABDOMEN PAIN SEVERITY: If present, ask: "How bad is the pain?"  (e.g., Scale 1-10; mild, moderate, or severe)   - MILD (1-3): doesn't interfere with normal activities, abdomen soft and not tender to touch    - MODERATE (4-7): interferes with normal activities or awakens from sleep, abdomen tender to touch    - SEVERE (8-10): excruciating pain, doubled over, unable to do any normal activities       na 9. ORAL INTAKE: If vomiting, "Have you been able to drink liquids?" "How much liquids have you had in the past 24 hours?"     Has been drinking water.  10. HYDRATION: "Any signs of dehydration?" (e.g., dry mouth [not just dry lips], too weak to stand, dizziness, new weight loss) "When did you last urinate?"       Denies  11. EXPOSURE: "Have you traveled to a foreign country recently?" "Have you been exposed to anyone with diarrhea?" "Could you have eaten any food that was spoiled?"  Na  12. OTHER SYMPTOMS: "Do you have any other symptoms?" (e.g., fever, blood in stool)       Loose bms. Incontinent at times. Temp 97.7.   13. PREGNANCY: "Is there any chance you are pregnant?" "When was your last menstrual period?"       na  Protocols used: Diarrhea on Antibiotics-A-AH

## 2022-11-14 NOTE — Telephone Encounter (Signed)
Summary: medication question   Pt called saying she stopped the antibiotic she was given last week because it gave her diarrhea.  [phone]        Called pt - left message on machine to return our call.

## 2022-11-15 NOTE — Telephone Encounter (Signed)
Requested medications are due for refill today.  yes  Requested medications are on the active medications list.  yes  Last refill. 10/15/2022 22g 0 rf  Future visit scheduled.   yes  Notes to clinic.  Medication not assigned to a protocol, please review for refill.    Requested Prescriptions  Pending Prescriptions Disp Refills   mupirocin ointment (BACTROBAN) 2 % 22 g 0    Sig: Apply 1 Application topically 2 (two) times daily.     Off-Protocol Failed - 11/14/2022  5:59 PM      Failed - Medication not assigned to a protocol, review manually.      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

## 2022-11-15 NOTE — Telephone Encounter (Signed)
Patient made aware of Provider's recommendations and verbalized understanding.   

## 2022-11-16 ENCOUNTER — Other Ambulatory Visit: Payer: Self-pay | Admitting: Nurse Practitioner

## 2022-11-16 DIAGNOSIS — L089 Local infection of the skin and subcutaneous tissue, unspecified: Secondary | ICD-10-CM | POA: Diagnosis not present

## 2022-11-16 DIAGNOSIS — S81801D Unspecified open wound, right lower leg, subsequent encounter: Secondary | ICD-10-CM | POA: Diagnosis not present

## 2022-11-16 DIAGNOSIS — L03116 Cellulitis of left lower limb: Secondary | ICD-10-CM | POA: Diagnosis not present

## 2022-11-16 MED ORDER — MUPIROCIN 2 % EX OINT: 1.0000 | TOPICAL_OINTMENT | Freq: Two times a day (BID) | CUTANEOUS | 4 refills | Status: DC

## 2022-11-22 DIAGNOSIS — L089 Local infection of the skin and subcutaneous tissue, unspecified: Secondary | ICD-10-CM | POA: Diagnosis not present

## 2022-11-22 DIAGNOSIS — S81801D Unspecified open wound, right lower leg, subsequent encounter: Secondary | ICD-10-CM | POA: Diagnosis not present

## 2022-11-22 DIAGNOSIS — L03116 Cellulitis of left lower limb: Secondary | ICD-10-CM | POA: Diagnosis not present

## 2022-11-22 DIAGNOSIS — R600 Localized edema: Secondary | ICD-10-CM | POA: Diagnosis not present

## 2022-11-24 ENCOUNTER — Inpatient Hospital Stay
Admission: EM | Admit: 2022-11-24 | Discharge: 2022-12-05 | DRG: 603 | Disposition: A | Discharge status: Skilled Nursing Facility | Payer: 59 | Attending: Internal Medicine | Admitting: Internal Medicine

## 2022-11-24 ENCOUNTER — Encounter: Payer: Self-pay | Admitting: Emergency Medicine

## 2022-11-24 ENCOUNTER — Emergency Department: Payer: 59

## 2022-11-24 ENCOUNTER — Other Ambulatory Visit: Payer: Self-pay

## 2022-11-24 ENCOUNTER — Ambulatory Visit: Payer: Self-pay

## 2022-11-24 DIAGNOSIS — K219 Gastro-esophageal reflux disease without esophagitis: Secondary | ICD-10-CM | POA: Diagnosis present

## 2022-11-24 DIAGNOSIS — R0602 Shortness of breath: Secondary | ICD-10-CM | POA: Diagnosis not present

## 2022-11-24 DIAGNOSIS — L03115 Cellulitis of right lower limb: Principal | ICD-10-CM | POA: Diagnosis present

## 2022-11-24 DIAGNOSIS — Z87891 Personal history of nicotine dependence: Secondary | ICD-10-CM | POA: Diagnosis not present

## 2022-11-24 DIAGNOSIS — F5101 Primary insomnia: Secondary | ICD-10-CM

## 2022-11-24 DIAGNOSIS — M6281 Muscle weakness (generalized): Secondary | ICD-10-CM | POA: Diagnosis not present

## 2022-11-24 DIAGNOSIS — Z79890 Hormone replacement therapy (postmenopausal): Secondary | ICD-10-CM | POA: Diagnosis not present

## 2022-11-24 DIAGNOSIS — F32A Depression, unspecified: Secondary | ICD-10-CM | POA: Diagnosis present

## 2022-11-24 DIAGNOSIS — Z7401 Bed confinement status: Secondary | ICD-10-CM | POA: Diagnosis not present

## 2022-11-24 DIAGNOSIS — I10 Essential (primary) hypertension: Secondary | ICD-10-CM | POA: Diagnosis not present

## 2022-11-24 DIAGNOSIS — Z85828 Personal history of other malignant neoplasm of skin: Secondary | ICD-10-CM | POA: Diagnosis not present

## 2022-11-24 DIAGNOSIS — I872 Venous insufficiency (chronic) (peripheral): Secondary | ICD-10-CM | POA: Diagnosis present

## 2022-11-24 DIAGNOSIS — G4700 Insomnia, unspecified: Secondary | ICD-10-CM | POA: Diagnosis not present

## 2022-11-24 DIAGNOSIS — I129 Hypertensive chronic kidney disease with stage 1 through stage 4 chronic kidney disease, or unspecified chronic kidney disease: Secondary | ICD-10-CM | POA: Diagnosis not present

## 2022-11-24 DIAGNOSIS — L03116 Cellulitis of left lower limb: Secondary | ICD-10-CM | POA: Diagnosis present

## 2022-11-24 DIAGNOSIS — E039 Hypothyroidism, unspecified: Secondary | ICD-10-CM

## 2022-11-24 DIAGNOSIS — G629 Polyneuropathy, unspecified: Secondary | ICD-10-CM | POA: Diagnosis not present

## 2022-11-24 DIAGNOSIS — M5481 Occipital neuralgia: Secondary | ICD-10-CM | POA: Diagnosis not present

## 2022-11-24 DIAGNOSIS — Z9842 Cataract extraction status, left eye: Secondary | ICD-10-CM | POA: Diagnosis not present

## 2022-11-24 DIAGNOSIS — Z8673 Personal history of transient ischemic attack (TIA), and cerebral infarction without residual deficits: Secondary | ICD-10-CM | POA: Diagnosis not present

## 2022-11-24 DIAGNOSIS — E875 Hyperkalemia: Secondary | ICD-10-CM | POA: Diagnosis not present

## 2022-11-24 DIAGNOSIS — Z880 Allergy status to penicillin: Secondary | ICD-10-CM | POA: Diagnosis not present

## 2022-11-24 DIAGNOSIS — R5381 Other malaise: Secondary | ICD-10-CM | POA: Diagnosis not present

## 2022-11-24 DIAGNOSIS — Z8249 Family history of ischemic heart disease and other diseases of the circulatory system: Secondary | ICD-10-CM

## 2022-11-24 DIAGNOSIS — R600 Localized edema: Secondary | ICD-10-CM | POA: Diagnosis not present

## 2022-11-24 DIAGNOSIS — L03119 Cellulitis of unspecified part of limb: Principal | ICD-10-CM

## 2022-11-24 DIAGNOSIS — M159 Polyosteoarthritis, unspecified: Secondary | ICD-10-CM | POA: Diagnosis not present

## 2022-11-24 DIAGNOSIS — N1831 Chronic kidney disease, stage 3a: Secondary | ICD-10-CM | POA: Diagnosis present

## 2022-11-24 DIAGNOSIS — G6181 Chronic inflammatory demyelinating polyneuritis: Secondary | ICD-10-CM | POA: Diagnosis not present

## 2022-11-24 DIAGNOSIS — I700 Atherosclerosis of aorta: Secondary | ICD-10-CM | POA: Diagnosis not present

## 2022-11-24 DIAGNOSIS — F419 Anxiety disorder, unspecified: Secondary | ICD-10-CM | POA: Diagnosis present

## 2022-11-24 DIAGNOSIS — K449 Diaphragmatic hernia without obstruction or gangrene: Secondary | ICD-10-CM | POA: Diagnosis not present

## 2022-11-24 DIAGNOSIS — E782 Mixed hyperlipidemia: Secondary | ICD-10-CM | POA: Diagnosis not present

## 2022-11-24 DIAGNOSIS — R918 Other nonspecific abnormal finding of lung field: Secondary | ICD-10-CM | POA: Diagnosis not present

## 2022-11-24 DIAGNOSIS — Z66 Do not resuscitate: Secondary | ICD-10-CM | POA: Diagnosis present

## 2022-11-24 DIAGNOSIS — N179 Acute kidney failure, unspecified: Secondary | ICD-10-CM | POA: Diagnosis not present

## 2022-11-24 DIAGNOSIS — Z743 Need for continuous supervision: Secondary | ICD-10-CM | POA: Diagnosis not present

## 2022-11-24 DIAGNOSIS — J984 Other disorders of lung: Secondary | ICD-10-CM | POA: Diagnosis not present

## 2022-11-24 DIAGNOSIS — M7989 Other specified soft tissue disorders: Secondary | ICD-10-CM | POA: Diagnosis not present

## 2022-11-24 DIAGNOSIS — H547 Unspecified visual loss: Secondary | ICD-10-CM | POA: Diagnosis not present

## 2022-11-24 DIAGNOSIS — E785 Hyperlipidemia, unspecified: Secondary | ICD-10-CM | POA: Diagnosis not present

## 2022-11-24 DIAGNOSIS — Z961 Presence of intraocular lens: Secondary | ICD-10-CM | POA: Diagnosis present

## 2022-11-24 DIAGNOSIS — Z79899 Other long term (current) drug therapy: Secondary | ICD-10-CM

## 2022-11-24 DIAGNOSIS — Z9841 Cataract extraction status, right eye: Secondary | ICD-10-CM | POA: Diagnosis not present

## 2022-11-24 DIAGNOSIS — I517 Cardiomegaly: Secondary | ICD-10-CM | POA: Diagnosis not present

## 2022-11-24 DIAGNOSIS — Z7902 Long term (current) use of antithrombotics/antiplatelets: Secondary | ICD-10-CM

## 2022-11-24 DIAGNOSIS — L0390 Cellulitis, unspecified: Secondary | ICD-10-CM | POA: Diagnosis not present

## 2022-11-24 LAB — COMPREHENSIVE METABOLIC PANEL
ALT: 16 U/L (ref 0–44)
AST: 25 U/L (ref 15–41)
Albumin: 3.4 g/dL — ABNORMAL LOW (ref 3.5–5.0)
Alkaline Phosphatase: 65 U/L (ref 38–126)
Anion gap: 9 (ref 5–15)
BUN: 27 mg/dL — ABNORMAL HIGH (ref 8–23)
CO2: 26 mmol/L (ref 22–32)
Calcium: 8.8 mg/dL — ABNORMAL LOW (ref 8.9–10.3)
Chloride: 103 mmol/L (ref 98–111)
Creatinine, Ser: 1.25 mg/dL — ABNORMAL HIGH (ref 0.44–1.00)
GFR, Estimated: 42 mL/min — ABNORMAL LOW (ref >60)
Glucose, Bld: 119 mg/dL — ABNORMAL HIGH (ref 70–99)
Potassium: 3.8 mmol/L (ref 3.5–5.1)
Sodium: 138 mmol/L (ref 135–145)
Total Bilirubin: 0.5 mg/dL (ref 0.3–1.2)
Total Protein: 6.3 g/dL — ABNORMAL LOW (ref 6.5–8.1)

## 2022-11-24 LAB — CBC WITH DIFFERENTIAL/PLATELET
Abs Immature Granulocytes: 0.03 10*3/uL (ref 0.00–0.07)
Basophils Absolute: 0.1 10*3/uL (ref 0.0–0.1)
Basophils Relative: 1 %
Eosinophils Absolute: 0.4 10*3/uL (ref 0.0–0.5)
Eosinophils Relative: 5 %
HCT: 33.3 % — ABNORMAL LOW (ref 36.0–46.0)
Hemoglobin: 10.9 g/dL — ABNORMAL LOW (ref 12.0–15.0)
Immature Granulocytes: 0 %
Lymphocytes Relative: 28 %
Lymphs Abs: 2 10*3/uL (ref 0.7–4.0)
MCH: 31 pg (ref 26.0–34.0)
MCHC: 32.7 g/dL (ref 30.0–36.0)
MCV: 94.6 fL (ref 80.0–100.0)
Monocytes Absolute: 0.9 10*3/uL (ref 0.1–1.0)
Monocytes Relative: 13 %
Neutro Abs: 3.7 10*3/uL (ref 1.7–7.7)
Neutrophils Relative %: 53 %
Platelets: 381 10*3/uL (ref 150–400)
RBC: 3.52 MIL/uL — ABNORMAL LOW (ref 3.87–5.11)
RDW: 12.8 % (ref 11.5–15.5)
WBC: 7 10*3/uL (ref 4.0–10.5)
nRBC: 0 % (ref 0.0–0.2)

## 2022-11-24 LAB — BRAIN NATRIURETIC PEPTIDE: B Natriuretic Peptide: 153.2 pg/mL — ABNORMAL HIGH (ref 0.0–100.0)

## 2022-11-24 MED ORDER — ACETAMINOPHEN 325 MG PO TABS
650.0000 mg | ORAL_TABLET | Freq: Four times a day (QID) | ORAL | Status: DC | PRN
  Administered 2022-11-24 – 2022-12-04 (×12): 650 mg via ORAL
  Filled 2022-11-24 (×12): qty 2

## 2022-11-24 MED ORDER — FUROSEMIDE 10 MG/ML IJ SOLN
40.0000 mg | Freq: Once | INTRAMUSCULAR | Status: AC
  Administered 2022-11-24: 40 mg via INTRAVENOUS
  Filled 2022-11-24: qty 4

## 2022-11-24 MED ORDER — ENOXAPARIN SODIUM 30 MG/0.3ML IJ SOSY
30.0000 mg | PREFILLED_SYRINGE | INTRAMUSCULAR | Status: DC
  Administered 2022-11-25 – 2022-11-26 (×2): 30 mg via SUBCUTANEOUS
  Filled 2022-11-24 (×3): qty 0.3

## 2022-11-24 MED ORDER — ONDANSETRON HCL 4 MG/2ML IJ SOLN
4.0000 mg | Freq: Four times a day (QID) | INTRAMUSCULAR | Status: DC | PRN
  Administered 2022-11-26 – 2022-11-28 (×3): 4 mg via INTRAVENOUS
  Filled 2022-11-24 (×3): qty 2

## 2022-11-24 MED ORDER — ENOXAPARIN SODIUM 40 MG/0.4ML IJ SOSY: 40.0000 mg | PREFILLED_SYRINGE | INTRAMUSCULAR | Status: DC

## 2022-11-24 MED ORDER — SODIUM CHLORIDE 0.9 % IV SOLN
1.0000 g | Freq: Once | INTRAVENOUS | Status: AC
  Administered 2022-11-24: 1 g via INTRAVENOUS
  Filled 2022-11-24: qty 10

## 2022-11-24 MED ORDER — MAGNESIUM HYDROXIDE 400 MG/5ML PO SUSP: 30.0000 mL | Freq: Every day | ORAL | Status: DC | PRN

## 2022-11-24 MED ORDER — ACETAMINOPHEN 650 MG RE SUPP: 650.0000 mg | Freq: Four times a day (QID) | RECTAL | Status: DC | PRN

## 2022-11-24 MED ORDER — SODIUM CHLORIDE 0.9 % IV SOLN: INTRAVENOUS | Status: DC

## 2022-11-24 MED ORDER — ALPRAZOLAM 0.25 MG PO TABS
0.2500 mg | ORAL_TABLET | Freq: Every evening | ORAL | Status: DC | PRN
  Administered 2022-11-24 – 2022-11-27 (×4): 0.25 mg via ORAL
  Filled 2022-11-24 (×5): qty 1

## 2022-11-24 MED ORDER — TRAZODONE HCL 50 MG PO TABS
25.0000 mg | ORAL_TABLET | Freq: Every evening | ORAL | Status: DC | PRN
  Administered 2022-11-24 – 2022-12-04 (×6): 25 mg via ORAL
  Filled 2022-11-24 (×7): qty 1

## 2022-11-24 MED ORDER — ONDANSETRON HCL 4 MG PO TABS
4.0000 mg | ORAL_TABLET | Freq: Four times a day (QID) | ORAL | Status: DC | PRN
  Administered 2022-11-26 – 2022-11-28 (×3): 4 mg via ORAL
  Filled 2022-11-24 (×3): qty 1

## 2022-11-24 MED ORDER — CEFAZOLIN SODIUM-DEXTROSE 2-4 GM/100ML-% IV SOLN
2.0000 g | Freq: Three times a day (TID) | INTRAVENOUS | Status: DC
  Administered 2022-11-25 – 2022-11-28 (×9): 2 g via INTRAVENOUS
  Filled 2022-11-24 (×9): qty 100

## 2022-11-24 NOTE — Progress Notes (Signed)
PHARMACIST - PHYSICIAN COMMUNICATION  CONCERNING:  Enoxaparin (Lovenox) for DVT Prophylaxis    RECOMMENDATION: Patient was prescribed enoxaprin 40mg  q24 hours for VTE prophylaxis.   Filed Weights   11/24/22 2036  Weight: 70 kg (154 lb 6.4 oz)    Body mass index is 30.15 kg/m.  Estimated Creatinine Clearance: 28.7 mL/min (A) (by C-G formula based on SCr of 1.25 mg/dL (H)).  Patient is candidate for enoxaparin 30mg  every 24 hours based on CrCl <54ml/min or Weight <45kg  DESCRIPTION: Pharmacy has adjusted enoxaparin dose per [facility] policy.  Patient is now receiving enoxaparin 30 mg every 24 hours    Foye Deer, PharmD Clinical Pharmacist  11/24/2022 8:41 PM

## 2022-11-24 NOTE — ED Provider Notes (Signed)
[facility] Provider Note  Patient Contact: 4:31 PM (approximate)   History   Leg Pain   HPI  Tracie Fisher is a 87 y.o. female with a history of GERD, hypothyroidism and leg cellulitis, presents to the emergency department with persistent swelling and erythema of the bilateral lower extremities with an anticipated appointment with wound care on 7/19.  Patient initiated her treatment for cellulitis on 11/16/2022.  Patient was initially treated with doxycycline which caused her to have diarrhea and then was transitioned to ciprofloxacin.  Patient reports that she is now having weeping of the right lower extremity and she states that she has noticed purulent exudate multiple times in the past few days.  She reports that her right leg is sore and has pain that radiates up her leg.  She is taking a diuretic every other day despite being instructed to take it every day.      Physical Exam   Triage Vital Signs: ED Triage Vitals [11/24/22 1605]  Encounter Vitals Group     BP (!) 159/92     Systolic BP Percentile      Diastolic BP Percentile      Pulse Rate 86     Resp 18     Temp 98 F (36.7 C)     Temp Source Oral     SpO2 94 %     Weight      Height      Head Circumference      Peak Flow      Pain Score 1     Pain Loc      Pain Education      Exclude from Growth Chart     Most recent vital signs: Vitals:   11/24/22 1605  BP: (!) 159/92  Pulse: 86  Resp: 18  Temp: 98 F (36.7 C)  SpO2: 94%     General: Alert and in no acute distress. Eyes:  PERRL. EOMI. Head: No acute traumatic findings ENT:      Nose: No congestion/rhinnorhea.      Mouth/Throat: Mucous membranes are moist. Neck: No stridor. No cervical spine tenderness to palpation. Cardiovascular:  Good peripheral perfusion Respiratory: Normal respiratory effort without tachypnea or retractions. Lungs CTAB. Good air entry to the bases with no decreased or absent breath  sounds. Gastrointestinal: Bowel sounds 4 quadrants. Soft and nontender to palpation. No guarding or rigidity. No palpable masses. No distention. No CVA tenderness. Musculoskeletal: Full range of motion to all extremities.  Neurologic:  No gross focal neurologic deficits are appreciated.  Skin: Patient has 2+ pitting edema bilaterally.     ED Results / Procedures / Treatments   Labs (all labs ordered are listed, but only abnormal results are displayed) Labs Reviewed  CBC WITH DIFFERENTIAL/PLATELET - Abnormal; Notable for the following components:      Result Value   RBC 3.52 (*)    Hemoglobin 10.9 (*)    HCT 33.3 (*)    All other components within normal limits  COMPREHENSIVE METABOLIC PANEL - Abnormal; Notable for the following components:   Glucose, Bld 119 (*)    BUN 27 (*)    Creatinine, Ser 1.25 (*)    Calcium 8.8 (*)    Total Protein 6.3 (*)    Albumin 3.4 (*)    GFR, Estimated 42 (*)    All other components within normal limits  BRAIN NATRIURETIC PEPTIDE        RADIOLOGY  I personally viewed  and evaluated these images as part of my medical decision making, as well as reviewing the written report by the radiologist.  ED Provider Interpretation: Venous ultrasound shows no signs of DVT.   PROCEDURES:  Critical Care performed: No  Procedures   MEDICATIONS ORDERED IN ED: Medications  cefTRIAXone (ROCEPHIN) 1 g in sodium chloride 0.9 % 100 mL IVPB (1 g Intravenous New Bag/Given 11/24/22 1904)  furosemide (LASIX) injection 40 mg (40 mg Intravenous Given 11/24/22 1755)     IMPRESSION / MDM / ASSESSMENT AND PLAN / ED COURSE  I reviewed the triage vital signs and the nursing notes.                              Assessment and plan: Leg swelling:  87 year old female presents to the emergency department with bilateral lower extremity swelling and erythema, left side worse than right.  Patient was hypertensive at triage but vital signs were otherwise  reassuring.  On exam, patient was alert and nontoxic-appearing.  Patient did have 3+ pitting edema appreciated bilaterally and some weeping on the right.  Left lower extremity appears more erythematous than right.  Patient has previously been treated with doxycycline and ciprofloxacin and has completed courses of each.  At patient's most recent appointment with her PCP, ciprofloxacin was represcribed.  Despite taking medications as directed, symptoms  seem to be worsening.  Venous ultrasound showed no signs of VTE.  Normal white blood cell count on CBC.  CMP mostly consistent with baseline labs.  Chest x-ray unremarkable.  Patient given IV Lasix in the emergency department as well as IV vancomycin and Rocephin.  Given worsening cellulitis despite multiple outpatient antibiotics, will admit patient to the hospitalist service under the care of Dr. Arville Care.       FINAL CLINICAL IMPRESSION(S) / ED DIAGNOSES   Final diagnoses:  Cellulitis of lower extremity, unspecified laterality     Rx / DC Orders   ED Discharge Orders     None        Note:  This document was prepared using Dragon voice recognition software and may include unintentional dictation errors.   Pia Mau Leith, PA-C 11/24/22 1916    Pilar Jarvis, MD 11/24/22 1945

## 2022-11-24 NOTE — ED Triage Notes (Signed)
Patient to ED via POV for leg problem. Patient to ED stating she is "leaking water down her right leg." Currently has wound on leg that she has a cream for. The left leg has been dx with cellulitis for which she is taking cipro. Patient concerned about the fluid running down the right leg.

## 2022-11-24 NOTE — Assessment & Plan Note (Signed)
-   We will continue statin therapy. 

## 2022-11-24 NOTE — Assessment & Plan Note (Signed)
-   We will continue Synthroid. 

## 2022-11-24 NOTE — Assessment & Plan Note (Addendum)
-   The patient will be admitted to a medical bed. - She is clearly failing outpatient antibiotic therapy. - Will continue antibiotics here with IV Rocephin and vancomycin. - Warm compresses will be utilized. - Pain management will be provided. - We will continue diuretic therapy.

## 2022-11-24 NOTE — ED Notes (Signed)
Pt back from restroom. This RN answered call bell to attach pt to antibiotic bag.

## 2022-11-24 NOTE — Assessment & Plan Note (Signed)
-   She will be placed on as needed IV labetalol. - We will continue diuretic therapy with Demadex.

## 2022-11-24 NOTE — ED Notes (Signed)
Patient assisted to use bedside commode.  Repositioned in bed for comfort

## 2022-11-24 NOTE — Assessment & Plan Note (Signed)
-   We will continue Lyrica. 

## 2022-11-24 NOTE — Telephone Encounter (Signed)
Summary: Wounds on leg, swelling   The patient called in stating she has a few draining wounds on her leg and one of them keeps dripping. She has continued to clean them but this new place on her leg keeps dripping water she says. It occasionally has a little yellow or red in it as well. She has an appt with her provider on Monday and she goes to the wound clinic next Friday. Please assist patient further

## 2022-11-24 NOTE — H&P (Signed)
[place]   PATIENT NAME: Tracie Fisher    MR#:  [id number]  DATE OF BIRTH:  January 29, 1936  DATE OF ADMISSION:  11/24/2022  PRIMARY CARE PHYSICIAN: Marjie Skiff, NP   Patient is coming from: Home  REQUESTING/REFERRING PHYSICIAN: Pia Mau, PA-C  CHIEF COMPLAINT:   Chief Complaint  Patient presents with   Leg Pain    HISTORY OF PRESENT ILLNESS:  Tracie Fisher is a 87 y.o. Caucasian female with medical history significant for GERD, depression, hypertension, and hypothyroidism, who presented to the emergency room with acute onset of worsening bilateral lower extremity swelling and erythema lately.  She was expected to see wound care on 7/19 for a follow-up appointment.  She was managed for cellulitis on 7/3 with doxycycline which gave her diarrhea.  She was then transition to p.o. Cipro.  She continued however to have worsening symptoms with weeping from her right lower extremity and noticed purulent drainage a few times during the last few days.  She had an accidental injury in that leg and skin tear recently.  She admitted to associated pain in her legs.  No chest pain or dyspnea or cough or hemoptysis.  No fever or chills.  No nausea or vomiting or abdominal pain.  No dysuria, oliguria or hematuria or frequency or urgency or flank pain.   ED Course: When she came to the ER, vital signs showed a BP of 159/92 with otherwise normal vital signs.  Labs revealed a BUN of 27 creatinine of 1.25 better than previous levels in March of this year and albumin of 3.4 with total protein of 6.3.  BNP was 153.2 and CBC showed anemia close to baseline without leukocytosis. EKG as reviewed by me : None Imaging: 2 view chest x-ray showed cardiomegaly with small patchy densities in the lower lung fields, more on the right side suggesting subsegmental atelectasis versus pneumonia with possible asymmetric edema.  She had blunting of lateral CP angles suggesting possible small effusions.   Bilateral lower extremity venous Doppler came back negative for DVT.  It showed mild subcutaneous edema.  The patient was given IV cefazolin and vancomycin.  She will be admitted to a medical bed for further evaluation and management. PAST MEDICAL HISTORY:   Past Medical History:  Diagnosis Date   Cancer (HCC)    SKIN   Complication of anesthesia    Depression    Dysrhythmia    GERD (gastroesophageal reflux disease)    Hiatal hernia    HOH (hard of hearing)    Hypertension    Hypothyroidism    PONV (postoperative nausea and vomiting)    Thyroid disease     PAST SURGICAL HISTORY:   Past Surgical History:  Procedure Laterality Date   APPENDECTOMY     BACK SURGERY     MULTIPLE/ROD IN BACK   BREAST CYST ASPIRATION Left "years ago"   CATARACT EXTRACTION W/PHACO Left 03/21/2017   Procedure: CATARACT EXTRACTION PHACO AND INTRAOCULAR LENS PLACEMENT (IOC);  Surgeon: Galen Manila, MD;  Location: [facility] ORS;  Service: Ophthalmology;  Laterality: Left;  [place] 00:48 AP% 17.8 CDE 8.57 Fluid pack lot # [id number]   CATARACT EXTRACTION W/PHACO Right 04/11/2017   Procedure: CATARACT EXTRACTION PHACO AND INTRAOCULAR LENS PLACEMENT (IOC);  Surgeon: Galen Manila, MD;  Location: [facility] ORS;  Service: Ophthalmology;  Laterality: Right;  [place] 01:09.5 AP% 15.8 CDE 11.04 Fluid Pack lot # [id number]   CHOLECYSTECTOMY     COLON SURGERY  BOWEL OBSTRUCTION   COLONOSCOPY WITH PROPOFOL N/A 04/25/2016   Procedure: COLONOSCOPY WITH PROPOFOL;  Surgeon: Wyline Mood, MD;  Location: [facility] ENDOSCOPY;  Service: Endoscopy;  Laterality: N/A;   ESOPHAGOGASTRODUODENOSCOPY (EGD) WITH PROPOFOL N/A 04/25/2016   Procedure: ESOPHAGOGASTRODUODENOSCOPY (EGD) WITH PROPOFOL;  Surgeon: Wyline Mood, MD;  Location: [facility] ENDOSCOPY;  Service: Endoscopy;  Laterality: N/A;   EXPLORATORY LAPAROTOMY  10/18/2011   blockage small intestines   GIVENS CAPSULE STUDY N/A 05/11/2016   Procedure: GIVENS CAPSULE STUDY;  Surgeon: Wyline Mood,  MD;  Location: [facility] ENDOSCOPY;  Service: Endoscopy;  Laterality: N/A;    SOCIAL HISTORY:   Social History   Tobacco Use   Smoking status: Former    Types: Cigarettes   Smokeless tobacco: Never  Substance Use Topics   Alcohol use: Yes    Comment: 1 beer /day or 2 occ    FAMILY HISTORY:   Family History  Problem Relation Age of Onset   Breast cancer Sister 61   Heart attack Brother    Heart disease Father     DRUG ALLERGIES:   Allergies  Allergen Reactions   Aspirin Itching   Penicillins Other (See Comments)    Unknown reaction Has patient had a PCN reaction causing immediate rash, facial/tongue/throat swelling, SOB or lightheadedness with hypotension: Unknown Has patient had a PCN reaction causing severe rash involving mucus membranes or skin necrosis: Unknown Has patient had a PCN reaction that required hospitalization: Unknown Has patient had a PCN reaction occurring within the last 10 years: No If all of the above answers are "NO", then may proceed with Cephalosporin use. Patient does not remember the reaction to penicillin.   Penicillin G     Unknown reaction Has patient had a PCN reaction causing immediate rash, facial/tongue/throat swelling, SOB or lightheadedness with hypotension: Unknown Has patient had a PCN reaction causing severe rash involving mucus membranes or skin necrosis: Unknown Has patient had a PCN reaction that required hospitalization: Unknown Has patient had a PCN reaction occurring within the last 10 years: No If all of the above answers are "NO", then may proceed with Cephalosporin use.     REVIEW OF SYSTEMS:   ROS As per history of present illness. All pertinent systems were reviewed above. Constitutional, HEENT, cardiovascular, respiratory, GI, GU, musculoskeletal, neuro, psychiatric, endocrine, integumentary and hematologic systems were reviewed and are otherwise negative/unremarkable except for positive findings mentioned above in the  HPI.   MEDICATIONS AT HOME:   Prior to Admission medications   Medication Sig Start Date End Date Taking? Authorizing Provider  ALPRAZolam Prudy Feeler) 1 MG tablet Take 0.5 mg (1/2 tablet) by mouth in the morning and 1 mg (1 tablet) by mouth at bedtime. 11/22/22  Yes Cannady, Jolene T, NP  ciprofloxacin (CIPRO) 250 MG tablet Take 250 mg by mouth 2 (two) times daily. 11/22/22 12/02/22 Yes [provider]  clopidogrel (PLAVIX) 75 MG tablet Take by mouth. 11/21/18  Yes [provider]  cyanocobalamin (V-R VITAMIN B-12) 500 MCG tablet Take 500 mcg by mouth daily.    Yes [provider]  ferrous gluconate (FERGON) 240 (27 FE) MG tablet Take 240 mg by mouth 2 (two) times daily.   Yes [provider]  levothyroxine (SYNTHROID) 50 MCG tablet Take 1 tablet (50 mcg total) by mouth daily before breakfast. 07/11/22  Yes Cannady, Jolene T, NP  mupirocin ointment (BACTROBAN) 2 % Apply 1 Application topically 2 (two) times daily. 11/16/22  Yes Cannady, Jolene T, NP  pravastatin (PRAVACHOL) 20  MG tablet Take by mouth. 11/21/18  Yes [provider]  pregabalin (LYRICA) 75 MG capsule Take 75 mg by mouth 3 (three) times daily.   Yes [provider]  Probiotic Product (PROBIOTIC DAILY PO) Take 1 capsule by mouth daily as needed.   Yes [provider]  torsemide (DEMADEX) 20 MG tablet Take 20 mg by mouth every other day.   Yes [provider]      VITAL SIGNS:  Blood pressure (!) 172/69, pulse 78, temperature 97.8 F (36.6 C), temperature source Oral, resp. rate 16, height 5' (1.524 m), weight 70 kg, SpO2 95%.  PHYSICAL EXAMINATION:  Physical Exam  GENERAL:  87 y.o.-year-old Caucasian female patient lying in the bed with no acute distress.  EYES: Pupils equal, round, reactive to light and accommodation. No scleral icterus. Extraocular muscles intact.  HEENT: Head atraumatic, normocephalic. Oropharynx and nasopharynx clear.  NECK:  Supple, no jugular  venous distention. No thyroid enlargement, no tenderness.  LUNGS: Normal breath sounds bilaterally, no wheezing, rales,rhonchi or crepitation. No use of accessory muscles of respiration.  CARDIOVASCULAR: Regular rate and rhythm, S1, S2 normal. No murmurs, rubs, or gallops.  ABDOMEN: Soft, nondistended, nontender. Bowel sounds present. No organomegaly or mass.  EXTREMITIES: 2+ bilateral lower extremity pitting edema with no cyanosis, or clubbing.  NEUROLOGIC: Cranial nerves II through XII are intact. Muscle strength 5/5 in all extremities. Sensation intact. Gait not checked.  PSYCHIATRIC: The patient is alert and oriented x 3.  Normal affect and good eye contact. SKIN: Bilateral lower extremity pitting edema with mild erythema, induration and tenderness with warmth.  LABORATORY PANEL:   CBC Recent Labs  Lab 11/24/22 1607  WBC 7.0  HGB 10.9*  HCT 33.3*  PLT 381   ------------------------------------------------------------------------------------------------------------------  Chemistries  Recent Labs  Lab 11/24/22 1607  NA 138  K 3.8  CL 103  CO2 26  GLUCOSE 119*  BUN 27*  CREATININE 1.25*  CALCIUM 8.8*  AST 25  ALT 16  ALKPHOS 65  BILITOT 0.5   ------------------------------------------------------------------------------------------------------------------  Cardiac Enzymes No results for input(s): "TROPONINI" in the last 168 hours. ------------------------------------------------------------------------------------------------------------------  RADIOLOGY:  US Venous Img Lower Bilateral (DVT)  Result Date: 11/24/2022 CLINICAL DATA:  Bilateral lower extremity swelling. EXAM: Bilateral LOWER EXTREMITY VENOUS DOPPLER ULTRASOUND TECHNIQUE: Gray-scale sonography with compression, as well as color and duplex ultrasound, were performed to evaluate the deep venous system(s) from the level of the common femoral vein through the popliteal and proximal calf veins. COMPARISON:   None Available. FINDINGS: VENOUS Normal compressibility of the common femoral, superficial femoral, and popliteal veins, as well as the visualized calf veins. Visualized portions of profunda femoral vein and great saphenous vein unremarkable. No filling defects to suggest DVT on grayscale or color Doppler imaging. Doppler waveforms show normal direction of venous flow, normal respiratory plasticity and response to augmentation. Limited views of the contralateral common femoral vein are unremarkable. OTHER Mild bilateral lower extremity subcutaneous edema. Limitations: none IMPRESSION: 1. No evidence of DVT either lower extremity. 2. Mild subcutaneous edema. Electronically Signed   By: Elgie Collard M.D.   On: 11/24/2022 18:30   DG Chest 2 View  Result Date: 11/24/2022 CLINICAL DATA:  Shortness of breath EXAM: CHEST - 2 VIEW COMPARISON:  None Available. FINDINGS: Transverse diameter of heart is increased. The soft tissue density in retrocardiac region suggesting hiatal hernia. There are no signs of alveolar pulmonary edema. Small patchy densities are seen in lower lung fields, more so on the right side.  There is blunting of lateral CP angles. There is no pneumothorax. There is previous fusion in lower cervical spine and in the lumbar spine. IMPRESSION: Cardiomegaly. Small patchy densities are seen in the lower lung fields, more so on the right side suggesting subsegmental atelectasis/pneumonia. Less likely possibility would be mild asymmetric interstitial edema. Blunting of lateral CP angles suggests possible small effusions. Electronically Signed   By: Ernie Avena M.D.   On: 11/24/2022 17:03      IMPRESSION AND PLAN:  Assessment and Plan: * Bilateral lower leg cellulitis - The patient will be admitted to a medical bed. - She is clearly failing outpatient antibiotic therapy. - Will continue antibiotics here with IV Rocephin and vancomycin. - Warm compresses will be utilized. - Pain  management will be provided. - We will continue diuretic therapy.  Uncontrolled hypertension - She will be placed on as needed IV labetalol. - We will continue diuretic therapy with Demadex.  Hypothyroidism - We will continue Synthroid.  Dyslipidemia - We will continue statin therapy.  Peripheral neuropathy - We will continue Lyrica.   DVT prophylaxis: Lovenox.  Advanced Care Planning:  Code Status: full code.  Family Communication:  The plan of care was discussed in details with the patient (and family). I answered all questions. The patient agreed to proceed with the above mentioned plan. Further management will depend upon hospital course. Disposition Plan: Back to previous home environment Consults called: none.  All the records are reviewed and case discussed with ED provider.  Status is: Inpatient   At the time of the admission, it appears that the appropriate admission status for this patient is inpatient.  This is judged to be reasonable and necessary in order to provide the required intensity of service to ensure the patient's safety given the presenting symptoms, physical exam findings and initial radiographic and laboratory data in the context of comorbid conditions.  The patient requires inpatient status due to high intensity of service, high risk of further deterioration and high frequency of surveillance required.  I certify that at the time of admission, it is my clinical judgment that the patient will require inpatient hospital care extending more than 2 midnights.                            Dispo: The patient is from: Home              Anticipated d/c is to: Home              Patient currently is not medically stable to d/c.              Difficult to place patient: No  Hannah Beat M.D on 11/24/2022 at 9:01 PM  [facility]   From 7 PM-7 AM, contact night-coverage [email or web address]  CC: Primary care physician; Marjie Skiff, NP

## 2022-11-24 NOTE — Telephone Encounter (Signed)
  Chief Complaint: Weeping wound on leg Symptoms: Fluid is leaking from leg Frequency: today Pertinent Negatives: Patient denies  Disposition: [] ED /[x] Urgent Care (no appt availability in office) / [] Appointment(In office/virtual)/ []  [place] [facility]/ [] Home Care/ [] Refused Recommended Disposition /[] [place] [facility]/ []  Follow-up with PCP Additional Notes: Pt states that she has several wounds on her leg. This new injury happened today. Pt thinks she hurt her leg with her fingernail. No noticeable wound. Leg is constantly weeping fluid. Pt is on ABX for other wounds. Pt will go to UC for care.    Answer Assessment - Initial Assessment Questions 1. APPEARANCE of INJURY: "What does the injury look like?"      No wound 2. SIZE: "How large is the cut?"      none 3. BLEEDING: "Is it bleeding now?" If Yes, ask: "Is it difficult to stop?"      no 4. LOCATION: "Where is the injury located?"      Right leg 5. ONSET: "How long ago did the injury occur?"      today 6. MECHANISM: "Tell me how it happened."      Hurt skin with fingernail  Protocols used: Skin Injury-A-AH

## 2022-11-25 ENCOUNTER — Encounter: Payer: Self-pay | Admitting: Family Medicine

## 2022-11-25 ENCOUNTER — Telehealth: Payer: Self-pay

## 2022-11-25 ENCOUNTER — Telehealth: Payer: 59

## 2022-11-25 DIAGNOSIS — E785 Hyperlipidemia, unspecified: Secondary | ICD-10-CM | POA: Diagnosis not present

## 2022-11-25 DIAGNOSIS — G6181 Chronic inflammatory demyelinating polyneuritis: Secondary | ICD-10-CM

## 2022-11-25 DIAGNOSIS — E039 Hypothyroidism, unspecified: Secondary | ICD-10-CM | POA: Diagnosis not present

## 2022-11-25 DIAGNOSIS — I10 Essential (primary) hypertension: Secondary | ICD-10-CM | POA: Diagnosis not present

## 2022-11-25 DIAGNOSIS — L03116 Cellulitis of left lower limb: Secondary | ICD-10-CM | POA: Diagnosis not present

## 2022-11-25 LAB — BASIC METABOLIC PANEL
Anion gap: 9 (ref 5–15)
BUN: 24 mg/dL — ABNORMAL HIGH (ref 8–23)
CO2: 28 mmol/L (ref 22–32)
Calcium: 8.5 mg/dL — ABNORMAL LOW (ref 8.9–10.3)
Chloride: 102 mmol/L (ref 98–111)
Creatinine, Ser: 1.27 mg/dL — ABNORMAL HIGH (ref 0.44–1.00)
GFR, Estimated: 41 mL/min — ABNORMAL LOW (ref >60)
Glucose, Bld: 82 mg/dL (ref 70–99)
Potassium: 3.7 mmol/L (ref 3.5–5.1)
Sodium: 139 mmol/L (ref 135–145)

## 2022-11-25 LAB — CBC
HCT: 35.8 % — ABNORMAL LOW (ref 36.0–46.0)
Hemoglobin: 11.2 g/dL — ABNORMAL LOW (ref 12.0–15.0)
MCH: 30.9 pg (ref 26.0–34.0)
MCHC: 31.3 g/dL (ref 30.0–36.0)
MCV: 98.6 fL (ref 80.0–100.0)
Platelets: 348 10*3/uL (ref 150–400)
RBC: 3.63 MIL/uL — ABNORMAL LOW (ref 3.87–5.11)
RDW: 13 % (ref 11.5–15.5)
WBC: 5 10*3/uL (ref 4.0–10.5)
nRBC: 0 % (ref 0.0–0.2)

## 2022-11-25 MED ORDER — VITAMIN B-12 1000 MCG PO TABS
500.0000 ug | ORAL_TABLET | Freq: Every day | ORAL | Status: DC
  Administered 2022-11-25 – 2022-12-05 (×11): 500 ug via ORAL
  Filled 2022-11-25 (×11): qty 1

## 2022-11-25 MED ORDER — VANCOMYCIN HCL 1500 MG/300ML IV SOLN
1500.0000 mg | Freq: Once | INTRAVENOUS | Status: AC
  Administered 2022-11-25: 1500 mg via INTRAVENOUS
  Filled 2022-11-25: qty 300

## 2022-11-25 MED ORDER — PRAVASTATIN SODIUM 20 MG PO TABS
20.0000 mg | ORAL_TABLET | Freq: Every day | ORAL | Status: DC
  Administered 2022-11-25 – 2022-12-04 (×10): 20 mg via ORAL
  Filled 2022-11-25 (×10): qty 1

## 2022-11-25 MED ORDER — VANCOMYCIN HCL 500 MG/100ML IV SOLN: 500.0000 mg | INTRAVENOUS | Status: DC

## 2022-11-25 MED ORDER — ALUM & MAG HYDROXIDE-SIMETH 200-200-20 MG/5ML PO SUSP
30.0000 mL | Freq: Once | ORAL | Status: AC
  Administered 2022-11-25: 30 mL via ORAL
  Filled 2022-11-25 (×2): qty 30

## 2022-11-25 MED ORDER — VANCOMYCIN HCL 1250 MG/250ML IV SOLN
1250.0000 mg | INTRAVENOUS | Status: DC
  Administered 2022-11-27: 1250 mg via INTRAVENOUS
  Filled 2022-11-25: qty 250

## 2022-11-25 MED ORDER — CLOPIDOGREL BISULFATE 75 MG PO TABS
75.0000 mg | ORAL_TABLET | Freq: Every day | ORAL | Status: DC
  Administered 2022-11-25 – 2022-12-05 (×11): 75 mg via ORAL
  Filled 2022-11-25 (×11): qty 1

## 2022-11-25 MED ORDER — TORSEMIDE 20 MG PO TABS
20.0000 mg | ORAL_TABLET | ORAL | Status: DC
  Administered 2022-11-26: 20 mg via ORAL
  Filled 2022-11-25: qty 1

## 2022-11-25 MED ORDER — RISAQUAD PO CAPS
1.0000 | ORAL_CAPSULE | Freq: Every day | ORAL | Status: DC
  Administered 2022-11-25 – 2022-12-05 (×11): 1 via ORAL
  Filled 2022-11-25 (×11): qty 1

## 2022-11-25 MED ORDER — FERROUS GLUCONATE 324 (38 FE) MG PO TABS
324.0000 mg | ORAL_TABLET | Freq: Two times a day (BID) | ORAL | Status: DC
  Administered 2022-11-25 – 2022-12-05 (×21): 324 mg via ORAL
  Filled 2022-11-25 (×21): qty 1

## 2022-11-25 MED ORDER — LEVOTHYROXINE SODIUM 50 MCG PO TABS
50.0000 ug | ORAL_TABLET | Freq: Every day | ORAL | Status: DC
  Administered 2022-11-26 – 2022-12-05 (×10): 50 ug via ORAL
  Filled 2022-11-25 (×10): qty 1

## 2022-11-25 NOTE — Plan of Care (Signed)
  Problem: Skin Integrity: Goal: Skin integrity will improve Outcome: Progressing   Problem: Pain Managment: Goal: General experience of comfort will improve Outcome: Progressing   Problem: Safety: Goal: Ability to remain free from injury will improve Outcome: Progressing   Problem: Skin Integrity: Goal: Risk for impaired skin integrity will decrease Outcome: Progressing   

## 2022-11-25 NOTE — Progress Notes (Signed)
Pharmacy Antibiotic Note  Tracie Fisher is a 87 y.o. female admitted on 11/24/2022 with cellulitis.  Pharmacy has been consulted for vancomycin dosing.  Plan:  Vancomycin 1.5 g IV LD 7/12 at 0354 followed by vancomycin 1.25 g IV q48h --Goal AUC 400-550 --Scr 1.27, IBW, Vd 0.72 (BMI 30.15) --Expected AUC: 523, Cmin: 12.1 --Daily Scr per protocol  Pharmacy will continue to follow and will adjust abx dosing whenever warranted.  Temp (24hrs), Avg:97.8 F (36.6 C), Min:97.6 F (36.4 C), Max:98 F (36.7 C)   Recent Labs  Lab 11/24/22 1607 11/25/22 0607  WBC 7.0 5.0  CREATININE 1.25* 1.27*    Estimated Creatinine Clearance: 28.3 mL/min (A) (by C-G formula based on SCr of 1.27 mg/dL (H)).    Allergies  Allergen Reactions   Aspirin Itching   Penicillins Other (See Comments)    Unknown reaction Has patient had a PCN reaction causing immediate rash, facial/tongue/throat swelling, SOB or lightheadedness with hypotension: Unknown Has patient had a PCN reaction causing severe rash involving mucus membranes or skin necrosis: Unknown Has patient had a PCN reaction that required hospitalization: Unknown Has patient had a PCN reaction occurring within the last 10 years: No If all of the above answers are "NO", then may proceed with Cephalosporin use. Patient does not remember the reaction to penicillin.   Penicillin G     Unknown reaction Has patient had a PCN reaction causing immediate rash, facial/tongue/throat swelling, SOB or lightheadedness with hypotension: Unknown Has patient had a PCN reaction causing severe rash involving mucus membranes or skin necrosis: Unknown Has patient had a PCN reaction that required hospitalization: Unknown Has patient had a PCN reaction occurring within the last 10 years: No If all of the above answers are "NO", then may proceed with Cephalosporin use.     Antimicrobials this admission: 7/11 Ceftriaxone x 1 dose 7/12 Cefazolin >>  7/12 Vancomycin  >>   Microbiology results: N/A  Thank you for allowing pharmacy to be a part of this patient's care.  Tracie Fisher 11/25/2022 10:16 AM

## 2022-11-25 NOTE — Progress Notes (Signed)
  Progress Note   Patient: Tracie Fisher ZOX:[id number] DOB: 01/17/1936 DOA: 11/24/2022     1 DOS: the patient was seen and examined on 11/25/2022   Brief hospital course: Tracie Fisher is a 87 y.o. Caucasian female with medical history significant for GERD, depression, hypertension, and hypothyroidism, who presented to the emergency room with acute onset of worsening bilateral lower extremity swelling and erythema lately. Patient is admitted for bilateral leg cellulitis, started antibiotics.  Assessment and Plan: * Bilateral lower leg cellulitis Failed outpatient antibiotics. Continue IV Rocephin and vancomycin. Warm compresses will be utilized. Pain control. Continue diuretic therapy.  Uncontrolled hypertension PRN IV labetalol. Continue diuretic therapy with Demadex.  Hypothyroidism Continue Synthroid.  Dyslipidemia On statin therapy.  Peripheral neuropathy Continue Lyrica.        Subjective: Patient is seen and examined today morning. She is sitting on edge of bed. Has bilateral leg swelling, redness. Right leg with dressing. Eating fair, getting out of bed.  Physical Exam: Vitals:   11/25/22 1215 11/25/22 1300 11/25/22 1345 11/25/22 1430  BP: (!) 157/84 (!) 151/75 (!) 135/57 (!) 157/66  Pulse: 76 95 72 71  Resp: 16 18 17 18   Temp:      TempSrc:      SpO2: 94% 98% 97% 97%  Weight:      Height:       General - Elderly Caucasian female, no apparent distress HEENT - PERRLA, EOMI, atraumatic head, non tender sinuses. Lung - Clear, rales, rhonchi, wheezes. Heart - S1, S2 heard, no murmurs, rubs, trace pedal edema Neuro - Alert, awake and oriented, non focal exam. Skin - Warm and dry. Right leg dressing. Left leg swelling, redness Data Reviewed:  CBC, BMP  Family Communication: Patient and son at bedside updated about care plan.  Disposition: Status is: Inpatient Remains inpatient appropriate because: IV antibiotic therapy.  Planned Discharge Destination:  Home    Time spent: 45 minutes  Author: Marcelino Duster, MD 11/25/2022 2:56 PM  For on call review [email or web address].

## 2022-11-25 NOTE — Telephone Encounter (Signed)
Noted, sounds like worsening cellulitis, is currently in hospital.

## 2022-11-25 NOTE — ED Notes (Signed)
Patient assisted to use bedside commode.  Steady gait with ambulation.  No reports of pain.  Updated on plan of care

## 2022-11-25 NOTE — Progress Notes (Signed)
Pharmacy Antibiotic Note  Tracie Fisher is a 87 y.o. female admitted on 11/24/2022 with cellulitis.  Pharmacy has been consulted for Vancomycin dosing for 7 days.  Plan: Pt given Vancomycin 1500 mg once. Vancomycin 500 mg IV Q 24 hrs. Goal AUC 400-550. Expected AUC: 413 SCr used: 1.25  Pharmacy will continue to follow and will adjust abx dosing whenever warranted.  Temp (24hrs), Avg:97.9 F (36.6 C), Min:97.8 F (36.6 C), Max:98 F (36.7 C)   Recent Labs  Lab 11/24/22 1607  WBC 7.0  CREATININE 1.25*    Estimated Creatinine Clearance: 28.7 mL/min (A) (by C-G formula based on SCr of 1.25 mg/dL (H)).    Allergies  Allergen Reactions   Aspirin Itching   Penicillins Other (See Comments)    Unknown reaction Has patient had a PCN reaction causing immediate rash, facial/tongue/throat swelling, SOB or lightheadedness with hypotension: Unknown Has patient had a PCN reaction causing severe rash involving mucus membranes or skin necrosis: Unknown Has patient had a PCN reaction that required hospitalization: Unknown Has patient had a PCN reaction occurring within the last 10 years: No If all of the above answers are "NO", then may proceed with Cephalosporin use. Patient does not remember the reaction to penicillin.   Penicillin G     Unknown reaction Has patient had a PCN reaction causing immediate rash, facial/tongue/throat swelling, SOB or lightheadedness with hypotension: Unknown Has patient had a PCN reaction causing severe rash involving mucus membranes or skin necrosis: Unknown Has patient had a PCN reaction that required hospitalization: Unknown Has patient had a PCN reaction occurring within the last 10 years: No If all of the above answers are "NO", then may proceed with Cephalosporin use.     Antimicrobials this admission: 7/11 Ceftriaxone >> x 1 dose 7/12 Cefazolin >> x 7 days 7/12 Vancomycin >> x 7 days  Microbiology results: No lab cx currently ordered or  pending at this time.  Thank you for allowing pharmacy to be a part of this patient's care.  Otelia Sergeant, PharmD, [facility] 11/25/2022 12:57 AM

## 2022-11-25 NOTE — ED Notes (Signed)
Transport requested

## 2022-11-25 NOTE — Telephone Encounter (Signed)
   CCM RN Visit Note   11-25-2022 Name: LARON PIQUE MRN: [id number]      DOB: 04-14-1936  Subjective: ADRIAUNA KAMEN is a 87 y.o. year old female who is a primary care patient of Aura Dials, NP. The patient was referred to the Chronic Care Management team for assistance with care management needs subsequent to provider initiation of CCM services and plan of care.      An unsuccessful telephone outreach was attempted today to contact the patient about Chronic Care Management needs.  The patient is currently in the hospital at [facility] ER waiting to be admitted due to cellulitis  Plan:A HIPAA compliant phone message was left for the patient providing contact information and requesting a return call.  Alto Denver RN, MSN, CCM RN Care Manager  Chronic Care Management Direct Number: [phone]

## 2022-11-26 DIAGNOSIS — L03116 Cellulitis of left lower limb: Secondary | ICD-10-CM | POA: Diagnosis not present

## 2022-11-26 DIAGNOSIS — E039 Hypothyroidism, unspecified: Secondary | ICD-10-CM | POA: Diagnosis not present

## 2022-11-26 DIAGNOSIS — E785 Hyperlipidemia, unspecified: Secondary | ICD-10-CM | POA: Diagnosis not present

## 2022-11-26 DIAGNOSIS — I10 Essential (primary) hypertension: Secondary | ICD-10-CM | POA: Diagnosis not present

## 2022-11-26 LAB — CREATININE, SERUM
Creatinine, Ser: 1.16 mg/dL — ABNORMAL HIGH (ref 0.44–1.00)
GFR, Estimated: 46 mL/min — ABNORMAL LOW (ref >60)

## 2022-11-26 MED ORDER — ALUM & MAG HYDROXIDE-SIMETH 200-200-20 MG/5ML PO SUSP
15.0000 mL | Freq: Four times a day (QID) | ORAL | Status: DC | PRN
  Administered 2022-11-26: 15 mL via ORAL
  Filled 2022-11-26: qty 30

## 2022-11-26 MED ORDER — TORSEMIDE 20 MG PO TABS
20.0000 mg | ORAL_TABLET | Freq: Every day | ORAL | Status: DC
  Administered 2022-11-27 – 2022-11-29 (×3): 20 mg via ORAL
  Filled 2022-11-26 (×4): qty 1

## 2022-11-26 NOTE — Evaluation (Signed)
Physical Therapy Evaluation Patient Details Name: Tracie Fisher MRN: [id number] DOB: 02-28-1936 Today's Date: 11/26/2022  History of Present Illness  Pt is an 87 y/o female admitted secondary to bilateral LE edema, found to have cellulitis. PMH including but not limited to GERD, depression, hypertension, and hypothyroidism.  Clinical Impression  Pt presented supine in bed with HOB elevated, awake and willing to participate in therapy session. Prior to admission, pt reported that she was independent with all functional mobility and ADLs. Pt lives alone in a single level apartment with a level entry. She stated that she does not have any family/friends that could be with her 24/7 upon d/c home. At the time of evaluation, pt required min A for bed mobility, min A for transfers and min guard to ambulate a very short distance with use of a RW. Pt would continue to benefit from skilled physical therapy services at this time while admitted and after d/c to address the below listed limitations in order to improve overall safety and independence with functional mobility.       Assistance Recommended at Discharge Frequent or constant Supervision/Assistance  If plan is discharge home, recommend the following:  Can travel by private vehicle  A little help with walking and/or transfers;A little help with bathing/dressing/bathroom;Assistance with cooking/housework;Assist for transportation;Help with stairs or ramp for entrance   Yes    Equipment Recommendations None recommended by PT  Recommendations for Other Services       Functional Status Assessment Patient has had a recent decline in their functional status and demonstrates the ability to make significant improvements in function in a reasonable and predictable amount of time.     Precautions / Restrictions Precautions Precautions: Fall Restrictions Weight Bearing Restrictions: No      Mobility  Bed Mobility Overal bed mobility: Needs  Assistance Bed Mobility: Supine to Sit, Sit to Supine     Supine to sit: Supervision Sit to supine: Min assist   General bed mobility comments: min A to return bilateral LEs onto bed    Transfers Overall transfer level: Needs assistance Equipment used: Rolling walker (2 wheels) Transfers: Sit to/from Stand Sit to Stand: Min assist           General transfer comment: increased time and effort, min A to power into standing from EOB x2 and from toilet x1    Ambulation/Gait Ambulation/Gait assistance: Min guard Gait Distance (Feet): 30 Feet Assistive device: Rolling walker (2 wheels) Gait Pattern/deviations: Step-through pattern, Decreased stride length Gait velocity: decreased     General Gait Details: pt with slow, steady gait with use of RW, min guard for safety, no overt LOB or need for physical assistance  Stairs            Wheelchair Mobility     Tilt Bed    Modified Rankin (Stroke Patients Only)       Balance Overall balance assessment: Needs assistance Sitting-balance support: Feet supported Sitting balance-Leahy Scale: Fair     Standing balance support: During functional activity, Single extremity supported, Bilateral upper extremity supported Standing balance-Leahy Scale: Poor                               Pertinent Vitals/Pain Pain Assessment Pain Assessment: Faces Faces Pain Scale: Hurts little more Pain Location: bilateral LEs Pain Descriptors / Indicators: Sore Pain Intervention(s): Monitored during session, Repositioned    Home Living Family/patient expects to be discharged to:: Private  residence Living Arrangements: Alone Available Help at Discharge: Family;Available PRN/intermittently Type of Home: Apartment Home Access: Level entry       Home Layout: One level Home Equipment: Rollator (4 wheels);Grab bars - tub/shower      Prior Function Prior Level of Function : Independent/Modified Independent              Mobility Comments: pt reported that she ambulates with use of her rollator when outside of her home and in the community but not when she is inside ADLs Comments: Pt states she drives to the grocery store; is (I) with medications. (I) ADL/IADL. States she lives in a senior apartment building. Denies falls.     Hand Dominance        Extremity/Trunk Assessment   Upper Extremity Assessment Upper Extremity Assessment: Generalized weakness    Lower Extremity Assessment Lower Extremity Assessment: Generalized weakness       Communication   Communication: HOH  Cognition Arousal/Alertness: Awake/alert Behavior During Therapy: Anxious Overall Cognitive Status: Impaired/Different from baseline Area of Impairment: Problem solving                             Problem Solving: Slow processing, Difficulty sequencing, Requires verbal cues          General Comments      Exercises     Assessment/Plan    PT Assessment Patient needs continued PT services  PT Problem List Decreased strength;Decreased range of motion;Decreased activity tolerance;Decreased balance;Decreased mobility;Decreased coordination;Decreased cognition;Decreased knowledge of use of DME;Decreased safety awareness;Decreased knowledge of precautions       PT Treatment Interventions DME instruction;Gait training;Stair training;Functional mobility training;Therapeutic activities;Therapeutic exercise;Balance training;Neuromuscular re-education;Patient/family education    PT Goals (Current goals can be found in the Care Plan section)  Acute Rehab PT Goals Patient Stated Goal: to feel better PT Goal Formulation: With patient/family Time For Goal Achievement: 12/10/22 Potential to Achieve Goals: Good    Frequency Min 3X/week     Co-evaluation               AM-PAC PT "6 Clicks" Mobility  Outcome Measure Help needed turning from your back to your side while in a flat bed without using  bedrails?: None Help needed moving from lying on your back to sitting on the side of a flat bed without using bedrails?: A Little Help needed moving to and from a bed to a chair (including a wheelchair)?: A Little Help needed standing up from a chair using your arms (e.g., wheelchair or bedside chair)?: A Little Help needed to walk in hospital room?: A Little Help needed climbing 3-5 steps with a railing? : A Lot 6 Click Score: 18    End of Session   Activity Tolerance: Patient tolerated treatment well Patient left: in bed;with call bell/phone within reach;with bed alarm set;with family/visitor present Nurse Communication: Mobility status PT Visit Diagnosis: Other abnormalities of gait and mobility (R26.89)    Time: 1610-9604 PT Time Calculation (min) (ACUTE ONLY): 37 min   Charges:   PT Evaluation $PT Eval Moderate Complexity: 1 Mod PT Treatments $Gait Training: 8-22 mins PT General Charges $$ ACUTE PT VISIT: 1 Visit         Arletta Bale, DPT  Acute Rehabilitation Services Office [phone]   Alessandra Bevels Lawayne Hartig 11/26/2022, 2:55 PM

## 2022-11-26 NOTE — Plan of Care (Signed)

## 2022-11-26 NOTE — Plan of Care (Signed)
  Problem: Skin Integrity: Goal: Skin integrity will improve Outcome: Progressing   Problem: Pain Managment: Goal: General experience of comfort will improve Outcome: Progressing   Problem: Safety: Goal: Ability to remain free from injury will improve Outcome: Progressing   Problem: Skin Integrity: Goal: Risk for impaired skin integrity will decrease Outcome: Progressing   

## 2022-11-26 NOTE — Progress Notes (Signed)
  Progress Note   Patient: Tracie Fisher ZOX:[id number] DOB: 1935-11-13 DOA: 11/24/2022     2 DOS: the patient was seen and examined on 11/26/2022   Brief hospital course: Tracie Fisher is a 87 y.o. Caucasian female with medical history significant for GERD, depression, hypertension, CKD stage 3, and hypothyroidism, who presented to the emergency room with acute onset of worsening bilateral lower extremity swelling and erythema lately. Patient is admitted for bilateral leg cellulitis, started antibiotics.  Assessment and Plan: * Bilateral lower leg cellulitis Failed outpatient antibiotics. Continue IV Rocephin and vancomycin. Elevate lower extremities. Lower extremity swelling likely from venous insufficiency, diastolic dysfunction. Continue Torsemide 20mg  daily. Pain control.  Uncontrolled hypertension PRN IV labetalol. Continue diuretic therapy with Demadex.  Hypothyroidism Continue Synthroid.  Dyslipidemia On statin therapy.  Peripheral neuropathy Continue Lyrica.        Subjective: Patient is seen and examined today morning. She is lying in bed. Family at bedside. She complains of right leg pain, back pain with movement. RN at bedside doing right leg dressing.  Physical Exam: Vitals:   11/25/22 1458 11/25/22 1533 11/26/22 0005 11/26/22 1112  BP:  (!) 122/94 (!) 159/61 (!) 156/84  Pulse:  73 79 82  Resp:  16 18 18   Temp: 97.8 F (36.6 C) 98.1 F (36.7 C) 98.1 F (36.7 C)   TempSrc: Oral     SpO2:  96% 93%   Weight:      Height:       General - Elderly Caucasian female, no apparent distress HEENT - PERRLA, EOMI, atraumatic head, non tender sinuses. Lung - Clear, rales, rhonchi, wheezes. Heart - S1, S2 heard, no murmurs, rubs, 2+ pedal edema Neuro - Alert, awake and oriented, non focal exam. Skin - Warm and dry. Right leg dressing. Left leg swelling, redness Data Reviewed:  CBC, BMP  Family Communication: Patient and family at bedside updated about care  plan.  Disposition: Status is: Inpatient Remains inpatient appropriate because: IV antibiotic therapy.  Planned Discharge Destination: Home    Time spent: 45 minutes  Author: Marcelino Duster, MD 11/26/2022 12:04 PM  For on call review [email or web address].

## 2022-11-27 DIAGNOSIS — I10 Essential (primary) hypertension: Secondary | ICD-10-CM | POA: Diagnosis not present

## 2022-11-27 DIAGNOSIS — L03116 Cellulitis of left lower limb: Secondary | ICD-10-CM | POA: Diagnosis not present

## 2022-11-27 DIAGNOSIS — E785 Hyperlipidemia, unspecified: Secondary | ICD-10-CM | POA: Diagnosis not present

## 2022-11-27 DIAGNOSIS — E039 Hypothyroidism, unspecified: Secondary | ICD-10-CM | POA: Diagnosis not present

## 2022-11-27 LAB — CREATININE, SERUM
Creatinine, Ser: 1.16 mg/dL — ABNORMAL HIGH (ref 0.44–1.00)
GFR, Estimated: 46 mL/min — ABNORMAL LOW (ref >60)

## 2022-11-27 MED ORDER — HYDRALAZINE HCL 20 MG/ML IJ SOLN
5.0000 mg | Freq: Four times a day (QID) | INTRAMUSCULAR | Status: DC | PRN
  Administered 2022-11-27 – 2022-12-05 (×8): 5 mg via INTRAVENOUS
  Filled 2022-11-27 (×10): qty 1

## 2022-11-27 MED ORDER — ENOXAPARIN SODIUM 40 MG/0.4ML IJ SOSY
40.0000 mg | PREFILLED_SYRINGE | INTRAMUSCULAR | Status: DC
  Administered 2022-11-27: 40 mg via SUBCUTANEOUS
  Filled 2022-11-27: qty 0.4

## 2022-11-27 NOTE — Progress Notes (Signed)
PHARMACIST - PHYSICIAN COMMUNICATION  CONCERNING:  Enoxaparin (Lovenox) for DVT Prophylaxis    RECOMMENDATION: Patient was prescribed enoxaprin 40mg  q24 hours for VTE prophylaxis.   Filed Weights   11/24/22 2036  Weight: 70 kg (154 lb 6.4 oz)    Body mass index is 30.15 kg/m.  Estimated Creatinine Clearance: 31 mL/min (A) (by C-G formula based on SCr of 1.16 mg/dL (H)).  Patient is candidate for enoxaparin 40mg  every 24 hours based on CrCl >43ml/min or Weight >45kg  DESCRIPTION: Pharmacy has adjusted enoxaparin dose per [facility] policy.  Patient is now receiving enoxaparin 40 mg every 24 hours    Angelique Blonder, PharmD Clinical Pharmacist  11/27/2022 1:39 PM

## 2022-11-27 NOTE — Plan of Care (Signed)

## 2022-11-27 NOTE — Progress Notes (Signed)
  Progress Note   Patient: Tracie Fisher:[id number] DOB: Apr 06, 1936 DOA: 11/24/2022     3 DOS: the patient was seen and examined on 11/27/2022   Brief hospital course: Tracie Fisher is a 87 y.o. Caucasian female with medical history significant for GERD, depression, hypertension, CKD stage 3, and hypothyroidism, who presented to the emergency room with acute onset of worsening bilateral lower extremity swelling and erythema lately. Patient is admitted for bilateral leg cellulitis, started antibiotics.  Assessment and Plan: * Bilateral lower leg cellulitis Failed outpatient antibiotics. Continue IV Ancef therapy. Elevate lower extremities. Lower extremity swelling likely from venous insufficiency, diastolic dysfunction. Continue Torsemide 20mg  daily. Pain control, antiemetics as needed.  Uncontrolled hypertension PRN IV Hydralazine. Continue diuretic therapy with Demadex. Anxiolytics PRN.  Hypothyroidism Continue Synthroid.  Dyslipidemia On statin therapy.  Peripheral neuropathy Continue Lyrica.     Subjective: Patient is seen and examined today morning. She is lying in bed. Son at bedside. She complains of nausea. Leg swelling improved.  Physical Exam: Vitals:   11/26/22 1112 11/26/22 2326 11/27/22 0012 11/27/22 0737  BP: (!) 156/84 (!) 170/77 (!) 161/63 (!) 168/73  Pulse: 82 84 84 76  Resp: 18 18  18   Temp:   98.9 F (37.2 C) 98.1 F (36.7 C)  TempSrc:   Oral   SpO2:  93% 94% 93%  Weight:      Height:       General - Elderly Caucasian female, in distress due to nausea HEENT - PERRLA, EOMI, atraumatic head, non tender sinuses. Lung - Clear, rales, rhonchi, wheezes. Heart - S1, S2 heard, no murmurs, rubs, 2+ pedal edema Neuro - Alert, awake and oriented, non focal exam. Skin - Warm and dry. Right leg dressing. Left leg swelling, redness Data Reviewed:  Creatinine, GFR  Family Communication: Patient and family at bedside updated about care  plan.  Disposition: Status is: Inpatient Remains inpatient appropriate because: IV antibiotic therapy.  Planned Discharge Destination: Home    Time spent: 45 minutes  Author: Marcelino Duster, MD 11/27/2022 1:34 PM  For on call review [email or web address].

## 2022-11-28 ENCOUNTER — Inpatient Hospital Stay: Payer: 59

## 2022-11-28 ENCOUNTER — Ambulatory Visit: Payer: 59 | Admitting: Nurse Practitioner

## 2022-11-28 DIAGNOSIS — I10 Essential (primary) hypertension: Secondary | ICD-10-CM | POA: Diagnosis not present

## 2022-11-28 DIAGNOSIS — E039 Hypothyroidism, unspecified: Secondary | ICD-10-CM | POA: Diagnosis not present

## 2022-11-28 DIAGNOSIS — E785 Hyperlipidemia, unspecified: Secondary | ICD-10-CM | POA: Diagnosis not present

## 2022-11-28 DIAGNOSIS — L03116 Cellulitis of left lower limb: Secondary | ICD-10-CM | POA: Diagnosis not present

## 2022-11-28 LAB — CREATININE, SERUM
Creatinine, Ser: 1.25 mg/dL — ABNORMAL HIGH (ref 0.44–1.00)
GFR, Estimated: 42 mL/min — ABNORMAL LOW (ref >60)

## 2022-11-28 MED ORDER — ALPRAZOLAM 0.5 MG PO TABS
0.5000 mg | ORAL_TABLET | Freq: Three times a day (TID) | ORAL | Status: DC | PRN
  Administered 2022-11-28 – 2022-12-05 (×8): 0.5 mg via ORAL
  Filled 2022-11-28 (×11): qty 1

## 2022-11-28 MED ORDER — ENOXAPARIN SODIUM 40 MG/0.4ML IJ SOSY
40.0000 mg | PREFILLED_SYRINGE | INTRAMUSCULAR | Status: DC
  Administered 2022-11-28: 40 mg via SUBCUTANEOUS
  Filled 2022-11-28: qty 0.4

## 2022-11-28 MED ORDER — ENOXAPARIN SODIUM 30 MG/0.3ML IJ SOSY: 30.0000 mg | PREFILLED_SYRINGE | INTRAMUSCULAR | Status: DC

## 2022-11-28 NOTE — Progress Notes (Signed)
Physical Therapy Treatment Patient Details Name: Tracie Fisher MRN: [id number] DOB: 1935/07/11 Today's Date: 11/28/2022   History of Present Illness Pt is an 87 y/o female admitted secondary to bilateral LE edema, found to have cellulitis. PMH including but not limited to GERD, depression, hypertension, and hypothyroidism.    PT Comments  Pt seen for PT tx with pt's son Tracie Fisher) present for session. Pt is HOH which negatively impacts session. Pt also with delayed processing/response time. Pt is able to complete bed mobility with min assist, STS with CGA & ambulates short distance in room with RW & CGA. Pt appears SOB/rapidly breathing but SPO2 91% or > during session, max HR 110 bpm. Pt denies any symptoms or c/o but PT assisted pt to sitting & nurse/MD made aware of pt's behaviors. Pt also coughing, which son reports is worse this AM.     Assistance Recommended at Discharge Frequent or constant Supervision/Assistance  If plan is discharge home, recommend the following:  Can travel by private vehicle    A little help with walking and/or transfers;A little help with bathing/dressing/bathroom;Assistance with cooking/housework;Assist for transportation;Help with stairs or ramp for entrance   Yes  Equipment Recommendations  None recommended by PT    Recommendations for Other Services       Precautions / Restrictions Precautions Precautions: Fall Restrictions Weight Bearing Restrictions: No     Mobility  Bed Mobility Overal bed mobility: Needs Assistance Bed Mobility: Supine to Sit     Supine to sit: Min assist, HOB elevated (cuing for sequencing, assistance to upright trunk)          Transfers Overall transfer level: Needs assistance Equipment used: Rolling walker (2 wheels) Transfers: Sit to/from Stand Sit to Stand: Min guard           General transfer comment: STS from slightly elevated EOB with cuing for hand placement    Ambulation/Gait Ambulation/Gait  assistance: Min guard Gait Distance (Feet): 10 Feet Assistive device: Rolling walker (2 wheels) Gait Pattern/deviations: Decreased step length - right, Decreased step length - left, Decreased stride length Gait velocity: decreased     General Gait Details: Pt lets go of RW to hold to bed rail, requires cuing/assistance to place hand back on RW.   Stairs             Wheelchair Mobility     Tilt Bed    Modified Rankin (Stroke Patients Only)       Balance Overall balance assessment: Needs assistance Sitting-balance support: Feet supported, Bilateral upper extremity supported, Feet unsupported Sitting balance-Leahy Scale: Fair Sitting balance - Comments: supervision static sitting   Standing balance support: Bilateral upper extremity supported, During functional activity, Reliant on assistive device for balance Standing balance-Leahy Scale: Poor                              Cognition Arousal/Alertness: Awake/alert Behavior During Therapy: Flat affect Overall Cognitive Status: Impaired/Different from baseline Area of Impairment: Problem solving                             Problem Solving: Slow processing, Difficulty sequencing, Requires verbal cues General Comments: Pt is HOH which impairs session, pt with delayed responses. Appears to be in some distress with movement/mobilization but denies pain & denies c/o.        Exercises      General Comments General comments (skin  integrity, edema, etc.): PT assists pt with donning shoes BLE, SPO2 91% or > on room air, max HR 110 bpm.      Pertinent Vitals/Pain Pain Assessment Pain Assessment: No/denies pain    Home Living                          Prior Function            PT Goals (current goals can now be found in the care plan section) Acute Rehab PT Goals Patient Stated Goal: to feel better PT Goal Formulation: With patient/family Time For Goal Achievement:  12/10/22 Potential to Achieve Goals: Good Progress towards PT goals: PT to reassess next treatment    Frequency    Min 1X/week (changed to reflect new Delivery of Care Model)      PT Plan Current plan remains appropriate    Co-evaluation              AM-PAC PT "6 Clicks" Mobility   Outcome Measure    Help needed moving from lying on your back to sitting on the side of a flat bed without using bedrails?: A Little Help needed moving to and from a bed to a chair (including a wheelchair)?: A Little Help needed standing up from a chair using your arms (e.g., wheelchair or bedside chair)?: A Little Help needed to walk in hospital room?: A Little Help needed climbing 3-5 steps with a railing? : A Lot 6 Click Score: 14    End of Session   Activity Tolerance: Treatment limited secondary to medical complications (Comment) Patient left: in chair;with chair alarm set;with call bell/phone within reach;with family/visitor present Nurse Communication: Mobility status (vitals, behavior during session) PT Visit Diagnosis: Muscle weakness (generalized) (M62.81);Other abnormalities of gait and mobility (R26.89);Difficulty in walking, not elsewhere classified (R26.2)     Time: 4098-1191 PT Time Calculation (min) (ACUTE ONLY): 16 min  Charges:    $Therapeutic Activity: 8-22 mins PT General Charges $$ ACUTE PT VISIT: 1 Visit                     Aleda Grana, PT, DPT 11/28/22, 12:10 PM   Sandi Mariscal 11/28/2022, 12:09 PM

## 2022-11-28 NOTE — Care Management Important Message (Signed)
Important Message  Patient Details  Name: Tracie Fisher MRN: [id number] Date of Birth: 07-Aug-1935   Medicare Important Message Given:  N/A - LOS <3 / Initial given by admissions     [name] 11/28/2022, 9:24 AM

## 2022-11-28 NOTE — Progress Notes (Signed)
  Progress Note   Patient: Tracie Fisher RJJ:[id number] DOB: Jul 23, 1935 DOA: 11/24/2022     4 DOS: the patient was seen and examined on 11/28/2022   Brief hospital course: Tracie Fisher is a 87 y.o. Caucasian female with medical history significant for GERD, depression, hypertension, CKD stage 3, and hypothyroidism, who presented to the emergency room with acute onset of worsening bilateral lower extremity swelling and erythema lately. Patient is admitted for bilateral leg cellulitis, started antibiotics.  Assessment and Plan: * Bilateral lower leg cellulitis Failed outpatient antibiotics. Stop ancef, vanco due to nausea, her lower extremity erythema improved. Elevate lower extremities. Lower extremity swelling likely from venous insufficiency, diastolic dysfunction. DVT ruled out. Continue Torsemide 20mg  daily. Pain control, antiemetics as needed.  Shortness of breath Chest xray with right sided infiltrate vs edema. Will continue diuretic. She did 5 days of antibiotics. Will hold due to her nausea. Chest xray 2 view ordered.  Uncontrolled hypertension PRN IV Hydralazine. Continue diuretic therapy with Demadex. Anxiolytics PRN.  Hypothyroidism Continue Synthroid.  Dyslipidemia On statin therapy.  Peripheral neuropathy Continue Lyrica.     Subjective: Patient is seen and examined today morning. She has nausea. Seems anxious and short of breath. Son at bedside. Leg swelling, redness improved.  Physical Exam: Vitals:   11/27/22 1654 11/27/22 2131 11/27/22 2302 11/28/22 0722  BP: (!) 163/71 (!) 173/74 (!) 150/65 130/70  Pulse: 94 93 94 88  Resp: 18 (!) 22  18  Temp: 98.9 F (37.2 C)   97.6 F (36.4 C)  TempSrc:    Oral  SpO2: 95% 93%  93%  Weight:      Height:       General - Elderly Caucasian female, in distress due to nausea HEENT - PERRLA, EOMI, atraumatic head, non tender sinuses. Lung - decreased breath sounds, rales bibasal. Heart - S1, S2 heard, no murmurs,  rubs, 1+ pedal edema Neuro - Alert, awake and oriented, non focal exam. Skin - Warm and dry, leg swelling, redness better Data Reviewed:  Creatinine, GFR  Family Communication: Patient and family at bedside updated about care plan.  Disposition: Status is: Inpatient Remains inpatient appropriate because: nausea, anxiety, dyspnea. Need close monitoring  Planned Discharge Destination: Home    Time spent: 45 minutes  Author: Marcelino Duster, MD 11/28/2022 12:01 PM  For on call review [email or web address].

## 2022-11-28 NOTE — Plan of Care (Signed)

## 2022-11-28 NOTE — Progress Notes (Signed)
Pharmacy Antibiotic Note  Tracie Fisher is a 87 y.o. female admitted on 11/24/2022 with cellulitis.  Pharmacy has been consulted for vancomycin dosing.  Plan: Scr 1.16>>1.25 Will continue vancomycin 1.25 g IV q48h --Goal AUC 400-550 --Scr 1.25, IBW, Vd 0.72 (BMI 30.15) --Expected AUC: 516, Cmin: 11.9 --Daily Scr per protocol  F/u renal fxn and cultures if available   Temp (24hrs), Avg:98.3 F (36.8 C), Min:97.6 F (36.4 C), Max:98.9 F (37.2 C)   Recent Labs  Lab 11/24/22 1607 11/25/22 0607 11/26/22 0512 11/27/22 0333 11/28/22 0339  WBC 7.0 5.0  --   --   --   CREATININE 1.25* 1.27* 1.16* 1.16* 1.25*    Estimated Creatinine Clearance: 28.7 mL/min (A) (by C-G formula based on SCr of 1.25 mg/dL (H)).    Allergies  Allergen Reactions   Aspirin Itching   Penicillins Other (See Comments)    Unknown reaction Has patient had a PCN reaction causing immediate rash, facial/tongue/throat swelling, SOB or lightheadedness with hypotension: Unknown Has patient had a PCN reaction causing severe rash involving mucus membranes or skin necrosis: Unknown Has patient had a PCN reaction that required hospitalization: Unknown Has patient had a PCN reaction occurring within the last 10 years: No If all of the above answers are "NO", then may proceed with Cephalosporin use. Patient does not remember the reaction to penicillin.   Penicillin G     Unknown reaction Has patient had a PCN reaction causing immediate rash, facial/tongue/throat swelling, SOB or lightheadedness with hypotension: Unknown Has patient had a PCN reaction causing severe rash involving mucus membranes or skin necrosis: Unknown Has patient had a PCN reaction that required hospitalization: Unknown Has patient had a PCN reaction occurring within the last 10 years: No If all of the above answers are "NO", then may proceed with Cephalosporin use.     Antimicrobials this admission: 7/11 Ceftriaxone x 1 dose 7/12  Cefazolin >> 7/15 7/12 Vancomycin >>   Microbiology results: N/A  Thank you for allowing pharmacy to be a part of this patient's care.  Hoorain Kozakiewicz A 11/28/2022 11:24 AM

## 2022-11-29 ENCOUNTER — Telehealth: Payer: Self-pay

## 2022-11-29 ENCOUNTER — Telehealth: Payer: 59

## 2022-11-29 DIAGNOSIS — E785 Hyperlipidemia, unspecified: Secondary | ICD-10-CM | POA: Diagnosis not present

## 2022-11-29 DIAGNOSIS — L03116 Cellulitis of left lower limb: Secondary | ICD-10-CM | POA: Diagnosis not present

## 2022-11-29 DIAGNOSIS — I10 Essential (primary) hypertension: Secondary | ICD-10-CM | POA: Diagnosis not present

## 2022-11-29 DIAGNOSIS — E039 Hypothyroidism, unspecified: Secondary | ICD-10-CM | POA: Diagnosis not present

## 2022-11-29 LAB — CBC
HCT: 31.5 % — ABNORMAL LOW (ref 36.0–46.0)
Hemoglobin: 10.5 g/dL — ABNORMAL LOW (ref 12.0–15.0)
MCH: 30.6 pg (ref 26.0–34.0)
MCHC: 33.3 g/dL (ref 30.0–36.0)
MCV: 91.8 fL (ref 80.0–100.0)
Platelets: 333 10*3/uL (ref 150–400)
RBC: 3.43 MIL/uL — ABNORMAL LOW (ref 3.87–5.11)
RDW: 13.1 % (ref 11.5–15.5)
WBC: 8.4 10*3/uL (ref 4.0–10.5)
nRBC: 0 % (ref 0.0–0.2)

## 2022-11-29 LAB — BASIC METABOLIC PANEL
Anion gap: 12 (ref 5–15)
BUN: 26 mg/dL — ABNORMAL HIGH (ref 8–23)
CO2: 28 mmol/L (ref 22–32)
Calcium: 8.9 mg/dL (ref 8.9–10.3)
Chloride: 96 mmol/L — ABNORMAL LOW (ref 98–111)
Creatinine, Ser: 1.71 mg/dL — ABNORMAL HIGH (ref 0.44–1.00)
GFR, Estimated: 29 mL/min — ABNORMAL LOW (ref >60)
Glucose, Bld: 94 mg/dL (ref 70–99)
Potassium: 3.6 mmol/L (ref 3.5–5.1)
Sodium: 136 mmol/L (ref 135–145)

## 2022-11-29 MED ORDER — METOPROLOL TARTRATE 25 MG PO TABS
25.0000 mg | ORAL_TABLET | Freq: Two times a day (BID) | ORAL | Status: DC
  Administered 2022-11-29 – 2022-12-05 (×13): 25 mg via ORAL
  Filled 2022-11-29 (×13): qty 1

## 2022-11-29 MED ORDER — ENOXAPARIN SODIUM 30 MG/0.3ML IJ SOSY
30.0000 mg | PREFILLED_SYRINGE | INTRAMUSCULAR | Status: DC
  Administered 2022-11-29 – 2022-12-01 (×3): 30 mg via SUBCUTANEOUS
  Filled 2022-11-29 (×3): qty 0.3

## 2022-11-29 NOTE — Progress Notes (Signed)
Physical Therapy Treatment Patient Details Name: Tracie Fisher MRN: [id number] DOB: 1935/11/24 Today's Date: 11/29/2022   History of Present Illness Pt is an 87 y/o female admitted secondary to bilateral LE edema, found to have cellulitis. PMH including but not limited to GERD, depression, hypertension, and hypothyroidism.    PT Comments  Patient received in bed, she is agreeable to PT session. Son present in room. She is mod I with bed mobility and min guard/min A for sit to stand transfers. Ambulated 30 feet in room with RW and min guard. Patient requires increased time for processing throughout session. She will continue to benefit from skilled PT to improve functional independence and safety. Patient requires little physical assistance, but do not think she would be safe to be alone upon discharge.        Assistance Recommended at Discharge Frequent or constant Supervision/Assistance  If plan is discharge home, recommend the following:  Can travel by private vehicle    A little help with walking and/or transfers;A little help with bathing/dressing/bathroom;Assistance with cooking/housework;Assist for transportation;Help with stairs or ramp for entrance;Direct supervision/assist for medications management   Yes  Equipment Recommendations  Rolling walker (2 wheels)    Recommendations for Other Services       Precautions / Restrictions Precautions Precautions: Fall Restrictions Weight Bearing Restrictions: No     Mobility  Bed Mobility Overal bed mobility: Modified Independent Bed Mobility: Supine to Sit, Sit to Supine     Supine to sit: Modified independent (Device/Increase time) Sit to supine: Modified independent (Device/Increase time)   General bed mobility comments: Increased time but no physical assist needed. Use of bed rails    Transfers Overall transfer level: Needs assistance Equipment used: Rolling walker (2 wheels) Transfers: Sit to/from Stand Sit to  Stand: Min assist           General transfer comment: Min A from low toilet. Min guard from bed.  Cues for hand placement, but continues to want to pull up from walker    Ambulation/Gait Ambulation/Gait assistance: Min guard Gait Distance (Feet): 30 Feet Assistive device: Rolling walker (2 wheels) Gait Pattern/deviations: Step-through pattern, Decreased step length - right, Decreased step length - left, Decreased stride length Gait velocity: decreased     General Gait Details: Ambulated 2 laps in room and then to the bathroom. Able to manage self care and undergarments independently.   Stairs             Wheelchair Mobility     Tilt Bed    Modified Rankin (Stroke Patients Only)       Balance Overall balance assessment: Needs assistance Sitting-balance support: Feet supported Sitting balance-Leahy Scale: Good     Standing balance support: Bilateral upper extremity supported, During functional activity, Reliant on assistive device for balance Standing balance-Leahy Scale: Good Standing balance comment: patient able to pull up underwear without any UE support and was steady                            Cognition Arousal/Alertness: Awake/alert Behavior During Therapy: Flat affect Overall Cognitive Status: Impaired/Different from baseline Area of Impairment: Awareness, Problem solving                           Awareness: Emergent Problem Solving: Slow processing, Difficulty sequencing, Requires verbal cues, Requires tactile cues, Decreased initiation General Comments: slow to respond. Flat affect. anxious appearing.  Exercises      General Comments        Pertinent Vitals/Pain Pain Assessment Pain Assessment: No/denies pain    Home Living                          Prior Function            PT Goals (current goals can now be found in the care plan section) Acute Rehab PT Goals Patient Stated Goal: to feel  better PT Goal Formulation: With patient/family Time For Goal Achievement: 12/10/22 Potential to Achieve Goals: Good Progress towards PT goals: Progressing toward goals    Frequency    Min 1X/week      PT Plan Discharge plan needs to be updated    Co-evaluation              AM-PAC PT "6 Clicks" Mobility   Outcome Measure  Help needed turning from your back to your side while in a flat bed without using bedrails?: A Little Help needed moving from lying on your back to sitting on the side of a flat bed without using bedrails?: A Little Help needed moving to and from a bed to a chair (including a wheelchair)?: A Little Help needed standing up from a chair using your arms (e.g., wheelchair or bedside chair)?: A Little Help needed to walk in hospital room?: A Little Help needed climbing 3-5 steps with a railing? : A Lot 6 Click Score: 17    End of Session Equipment Utilized During Treatment: Gait belt Activity Tolerance: Patient tolerated treatment well Patient left: in bed;with call bell/phone within reach;with bed alarm set;with family/visitor present Nurse Communication: Mobility status PT Visit Diagnosis: Muscle weakness (generalized) (M62.81);Other abnormalities of gait and mobility (R26.89);Difficulty in walking, not elsewhere classified (R26.2)     Time: 8469-6295 PT Time Calculation (min) (ACUTE ONLY): 28 min  Charges:    $Gait Training: 8-22 mins $Therapeutic Activity: 8-22 mins PT General Charges $$ ACUTE PT VISIT: 1 Visit                     Jordann Grime, PT, GCS 11/29/22,2:30 PM

## 2022-11-29 NOTE — NC FL2 (Cosign Needed Addendum)
[place] MEDICAID FL2 LEVEL OF CARE FORM     IDENTIFICATION  Patient Name: Tracie Fisher Birthdate: [birth year] Sex: female Admission Date (Current Location): 11/24/2022  [facility] and [place] Number:  [address] [id number] R Facility and Address:  [facility], [address], [place], [place] [address]      Provider Number: [id number]  Attending Physician Name and Address:  Marcelino Duster, MD  Relative Name and Phone Number:  Lequita Dyce    Current Level of Care: Hospital Recommended Level of Care: Skilled Nursing Facility Prior Approval Number:    Date Approved/Denied:   PASRR Number: [id number]  Discharge Plan: SNF    Current Diagnoses: Patient Active Problem List   Diagnosis Date Noted   Bilateral lower leg cellulitis 11/24/2022   Dyslipidemia 11/24/2022   Peripheral neuropathy 11/24/2022   Hypothyroidism 11/24/2022   Uncontrolled hypertension 11/24/2022   Cellulitis of right lower extremity 10/26/2022   Hyperkalemia 09/26/2022   Overweight (BMI 25.0-29.9) 08/05/2022   Vision loss 08/04/2022   Senile purpura (HCC) 06/16/2022   Insomnia 06/16/2022   Varicose veins of both lower extremities without ulcer or inflammation 06/16/2022   Mixed hyperlipidemia 06/12/2022   Bilateral occipital neuralgia 06/12/2022   Sensory ataxia 06/12/2022   Osteopenia of left forearm 06/12/2022   CMC arthritis 05/23/2022   CKD (chronic kidney disease) stage 4, GFR 15-29 ml/min (HCC) 03/03/2022   Hypothyroidism, adult 03/01/2022   Loss of memory 07/26/2021   Moderate pulmonary hypertension (HCC) 05/20/2021   Primary osteoarthritis involving multiple joints 03/24/2021   Unsteady gait 10/16/2019   History of stroke 03/04/2019   Bilateral foot pain 03/06/2018   Chronic bilateral low back pain 03/06/2018   Neck pain 03/06/2018   Neuropathy 02/06/2018   History of nonmelanoma skin cancer 09/27/2016   Essential hypertension 09/02/2016   Hiatal hernia     Stricture of esophagus    Iron deficiency anemia due to chronic blood loss 03/23/2016   History of tachycardia 07/29/2015    Orientation RESPIRATION BLADDER Height & Weight     Self, Time, Situation, Place  Normal Continent Weight: 70 kg Height:  5' (152.4 cm)  BEHAVIORAL SYMPTOMS/MOOD NEUROLOGICAL BOWEL NUTRITION STATUS      Continent  (See Discharge Summary)  AMBULATORY STATUS COMMUNICATION OF NEEDS Skin   Extensive Assist Verbally Normal                       Personal Care Assistance Level of Assistance  Bathing, Feeding, Dressing Bathing Assistance: Maximum assistance Feeding assistance: Limited assistance Dressing Assistance: Maximum assistance     Functional Limitations Info  Sight, Hearing, Speech Sight Info: Impaired (Wears glasses) Hearing Info: Impaired (HOH) Speech Info: Adequate    SPECIAL CARE FACTORS FREQUENCY  PT (By licensed PT), OT (By licensed OT)     PT Frequency: 5x weekly OT Frequency: 5x weekly            Contractures Contractures Info: Not present    Additional Factors Info  Code Status, Allergies Code Status Info: DNR Allergies Info: Aspirin, Penicillins, Penicillin G           Current Medications (11/29/2022):  This is the current hospital active medication list Current Facility-Administered Medications  Medication Dose Route Frequency Provider Last Rate Last Admin   0.9 %  sodium chloride infusion   Intravenous Continuous Marcelino Duster, MD 10 mL/hr at 11/26/22 1447 Rate Change at 11/26/22 1447   acetaminophen (TYLENOL) tablet 650 mg  650 mg Oral Q6H PRN  Mansy, Jan A, MD   650 mg at 11/29/22 1610   Or   acetaminophen (TYLENOL) suppository 650 mg  650 mg Rectal Q6H PRN Mansy, Jan A, MD       acidophilus (RISAQUAD) capsule 1 capsule  1 capsule Oral Daily Marcelino Duster, MD   1 capsule at 11/29/22 1011   ALPRAZolam (XANAX) tablet 0.5 mg  0.5 mg Oral TID PRN Marcelino Duster, MD   0.5 mg at 11/28/22 2049    alum & mag hydroxide-simeth (MAALOX/MYLANTA) 200-200-20 MG/5ML suspension 15 mL  15 mL Oral Q6H PRN Marcelino Duster, MD   15 mL at 11/26/22 1827   clopidogrel (PLAVIX) tablet 75 mg  75 mg Oral Daily Marcelino Duster, MD   75 mg at 11/29/22 1013   cyanocobalamin (VITAMIN B12) tablet 500 mcg  500 mcg Oral Daily Marcelino Duster, MD   500 mcg at 11/29/22 1011   enoxaparin (LOVENOX) injection 30 mg  30 mg Subcutaneous Q24H Burnis Medin D, RPH       ferrous gluconate (FERGON) tablet 324 mg  324 mg Oral BID Marcelino Duster, MD   324 mg at 11/29/22 1013   hydrALAZINE (APRESOLINE) injection 5 mg  5 mg Intravenous Q6H PRN Marcelino Duster, MD   5 mg at 11/28/22 1206   levothyroxine (SYNTHROID) tablet 50 mcg  50 mcg Oral QAC breakfast Marcelino Duster, MD   50 mcg at 11/29/22 0613   magnesium hydroxide (MILK OF MAGNESIA) suspension 30 mL  30 mL Oral Daily PRN Mansy, Jan A, MD       ondansetron [facility]) tablet 4 mg  4 mg Oral Q6H PRN Mansy, Jan A, MD   4 mg at 11/28/22 0554   Or   ondansetron (ZOFRAN) injection 4 mg  4 mg Intravenous Q6H PRN Mansy, Jan A, MD   4 mg at 11/28/22 1159   pravastatin (PRAVACHOL) tablet 20 mg  20 mg Oral q1800 Marcelino Duster, MD   20 mg at 11/28/22 1728   torsemide (DEMADEX) tablet 20 mg  20 mg Oral Daily Marcelino Duster, MD   20 mg at 11/29/22 1011   traZODone (DESYREL) tablet 25 mg  25 mg Oral QHS PRN Mansy, Jan A, MD   25 mg at 11/28/22 2049     Discharge Medications: Please see discharge summary for a list of discharge medications.  Relevant Imaging Results:  Relevant Lab Results:   Additional Information [id number]  Garret Reddish, RN

## 2022-11-29 NOTE — TOC Initial Note (Signed)
Transition of Care [facility]) - Initial/Assessment Note    Patient Details  Name: Tracie Fisher MRN: [id number] Date of Birth: 22-Dec-1935  [facility] CM/SW Contact:    Garret Reddish, RN Phone Number: 11/29/2022, 1:50 PM  Clinical Narrative:     Chart reviewed.  Noted that patient was admitted with Bilateral lower leg cellulitis.  Patient has been treated with IV Rocephin and IV Vancomycin.    I have spoken with patient's son [name].  [name] informs me that prior to admission patient lived at home by herself.  [name] reports that patient was independent prior to admission.  Patient was able to bath and dress herself.  Patient did not require any assistive devices  for ambulation.  Patient was also driving prior to admission.    [name] reports that he would like for his mother to receive short-term rehab prior to going back home. He reports that she has had a lot of let weakness and feels the short term rehab would benefit [name].  I have spoken to Mr. [name] about the SNF process.  He is agreeable to complete a bed search in the [place] area.    I have completed Fl 2, submitted for PaSSr and completed a bed search in the [place] area.    PASSR is pending.    TOC will continue to follow for discharge planning.            Expected Discharge Plan: Skilled Nursing Facility Barriers to Discharge: No Barriers Identified   Patient Goals and CMS Choice   CMS Medicare.gov Compare Post Acute Care list provided to:: Patient Choice offered to / list presented to : Patient      Expected Discharge Plan and Services   Discharge Planning Services: CM Consult Post Acute Care Choice: Skilled Nursing Facility Living arrangements for the past 2 months: Single Family Home                                      Prior Living Arrangements/Services Living arrangements for the past 2 months: Single Family Home Lives with:: Self Patient language and need for interpreter  reviewed:: Yes Do you feel safe going back to the place where you live?: Yes      Need for Family Participation in Patient Care: Yes (Comment) (Patient has a supportive son [name])   Current home services:  (Patient has a rolling walker but was not using RW prior to admission)    Activities of Daily Living Home Assistive Devices/Equipment: Cane (specify quad or straight) ADL Screening (condition at time of admission) Patient's cognitive ability adequate to safely complete daily activities?: Yes Is the patient deaf or have difficulty hearing?: No Does the patient have difficulty seeing, even when wearing glasses/contacts?: No Does the patient have difficulty concentrating, remembering, or making decisions?: No Patient able to express need for assistance with ADLs?: Yes Does the patient have difficulty dressing or bathing?: No Independently performs ADLs?: Yes (appropriate for developmental age) Does the patient have difficulty walking or climbing stairs?: No Weakness of Legs: None Weakness of Arms/Hands: None  Permission Sought/Granted                  Emotional Assessment       Orientation: : Oriented to Self, Oriented to Place, Oriented to  Time, Oriented to Situation      Admission diagnosis:  Bilateral lower leg cellulitis [  L03.116, L03.115] Cellulitis of lower extremity, unspecified laterality [L03.119] Patient Active Problem List   Diagnosis Date Noted   Bilateral lower leg cellulitis 11/24/2022   Dyslipidemia 11/24/2022   Peripheral neuropathy 11/24/2022   Hypothyroidism 11/24/2022   Uncontrolled hypertension 11/24/2022   Cellulitis of right lower extremity 10/26/2022   Hyperkalemia 09/26/2022   Overweight (BMI 25.0-29.9) 08/05/2022   Vision loss 08/04/2022   Senile purpura (HCC) 06/16/2022   Insomnia 06/16/2022   Varicose veins of both lower extremities without ulcer or inflammation 06/16/2022   Mixed hyperlipidemia 06/12/2022   Bilateral occipital  neuralgia 06/12/2022   Sensory ataxia 06/12/2022   Osteopenia of left forearm 06/12/2022   CMC arthritis 05/23/2022   CKD (chronic kidney disease) stage 4, GFR 15-29 ml/min (HCC) 03/03/2022   Hypothyroidism, adult 03/01/2022   Loss of memory 07/26/2021   Moderate pulmonary hypertension (HCC) 05/20/2021   Primary osteoarthritis involving multiple joints 03/24/2021   Unsteady gait 10/16/2019   History of stroke 03/04/2019   Bilateral foot pain 03/06/2018   Chronic bilateral low back pain 03/06/2018   Neck pain 03/06/2018   Neuropathy 02/06/2018   History of nonmelanoma skin cancer 09/27/2016   Essential hypertension 09/02/2016   Hiatal hernia    Stricture of esophagus    Iron deficiency anemia due to chronic blood loss 03/23/2016   History of tachycardia 07/29/2015   PCP:  Marjie Skiff, NP Pharmacy:   [facility] [phone], [place] - [address] [place] [address] Phone: [phone] Fax: [phone]  [facility] [place], [place] - [place] [address] [place] [address] Phone: [phone] Fax: [phone]     Social Determinants of Health (SDOH) Social History: SDOH Screenings   Food Insecurity: No Food Insecurity (11/25/2022)  Housing: Low Risk  (11/25/2022)  Transportation Needs: No Transportation Needs (11/25/2022)  Utilities: Not At Risk (11/25/2022)  Alcohol Screen: Low Risk  (09/13/2022)  Depression (PHQ2-9): Medium Risk (11/10/2022)  Financial Resource Strain: Low Risk  (09/13/2022)  Physical Activity: Insufficiently Active (09/13/2022)  Social Connections: Socially Isolated (09/13/2022)  Stress: No Stress Concern Present (09/13/2022)  Tobacco Use: Medium Risk (11/25/2022)   SDOH Interventions:     Readmission Risk Interventions     No data to display

## 2022-11-29 NOTE — Care Management Important Message (Signed)
Important Message  Patient Details  Name: RAYNETTE ARRAS MRN: [id number] Date of Birth: [birth year]   Medicare Important Message Given:  N/A - LOS <3 / Initial given by admissions     Olegario Messier A Maguire Killmer 11/29/2022, 9:53 AM

## 2022-11-29 NOTE — Progress Notes (Signed)
   11/29/22 1400  Spiritual Encounters  Type of Visit Initial  Care provided to: Patient  Referral source Chaplain assessment  Reason for visit Routine spiritual support  OnCall Visit No  Spiritual Framework  Presenting Themes Meaning/purpose/sources of inspiration  Community/Connection Family  Patient Stress Factors Health changes  Family Stress Factors None identified  Interventions  Spiritual Care Interventions Made Established relationship of care and support;Compassionate presence;Reflective listening;Encouragement  Intervention Outcomes  Outcomes Awareness of support  Spiritual Care Plan  Spiritual Care Issues Still Outstanding No further spiritual care needs at this time (see row info)   Chaplain visited with patient providing compassionate care. Chaplain spiritual support services remain available as the need arises.

## 2022-11-29 NOTE — Plan of Care (Signed)

## 2022-11-29 NOTE — Progress Notes (Signed)
RE: Tracie Fisher Date of Birth: 1935/10/26 Date: 11-29-2022     To Whom It May Concern:   Please be advised that the above-named patient will require a short-term nursing home stay - anticipated 30 days or less for rehabilitation and strengthening.  The plan is for return home

## 2022-11-29 NOTE — Progress Notes (Signed)
PHARMACIST - PHYSICIAN COMMUNICATION  CONCERNING:  Enoxaparin (Lovenox) for DVT Prophylaxis    RECOMMENDATION: Patient was prescribed enoxaprin 40mg  q24 hours for VTE prophylaxis.   Filed Weights   11/24/22 2036  Weight: 70 kg (154 lb 6.4 oz)    Body mass index is 30.15 kg/m.  Estimated Creatinine Clearance: 21 mL/min (A) (by C-G formula based on SCr of 1.71 mg/dL (H)).  Patient is candidate for enoxaparin 30mg  every 24 hours based on CrCl <60ml/min   DESCRIPTION: Pharmacy has adjusted enoxaparin dose per [facility] policy.  Patient is now receiving enoxaparin 30 mg every 24 hours    Lowella Bandy, PharmD Clinical Pharmacist  11/29/2022 8:07 AM

## 2022-11-29 NOTE — Telephone Encounter (Signed)
   CCM RN Visit Note  11-29-2022 Name: ADISON REIFSTECK MRN: [id number]      DOB: Jul 13, 1935  Subjective: Tracie Fisher is a 87 y.o. year old female who is a primary care patient of Aura Dials, NP.  The patient was referred to the Chronic Care Management team for assistance with care management needs subsequent to provider initiation of CCM services and plan of care.      Second unsuccessful telephone outreach was attempted today to contact the patient about Chronic Care Management needs.  The patient remains inpatient. Will continue to monitor.   Plan:A HIPAA compliant phone message was left for the patient providing contact information and requesting a return call.  Alto Denver RN, MSN, CCM RN Care Manager  Chronic Care Management Direct Number: [phone]

## 2022-11-29 NOTE — Progress Notes (Addendum)
  Progress Note   Patient: Tracie Fisher IRS:[id number] DOB: October 26, 1935 DOA: 11/24/2022     5 DOS: the patient was seen and examined on 11/29/2022   Brief hospital course: Tracie Fisher is a 87 y.o. Caucasian female with medical history significant for GERD, depression, hypertension, CKD stage 3, and hypothyroidism, who presented to the emergency room with acute onset of worsening bilateral lower extremity swelling and erythema lately. Patient is admitted for bilateral leg cellulitis, started antibiotics.  Assessment and Plan: * Bilateral lower leg cellulitis Failed outpatient antibiotics. Did receive 5 days of ancef, vanco. Stopped antibiotics due to her nausea. Her lower extremity redness, swelling improved. Advised to elevate lower extremities.  Shortness of breath and leg swelling Chest xray with right sided infiltrate vs edema.  Repeat xray from yesterday showed improvement.  Finished antibiotics. Continue home dose diuretic.   Uncontrolled hypertension In the setting of pain, anxiety. Today she seems calm, will start Metoprolol 25mg  BID. Continue hydralazine PRN. Continue pain control, anxiolytics PRN Continue diuretic therapy with Demadex.  CKD stage 3a- Monitor daily renal function. She is on daily diuretic therapy. (At home takes alternate days)  Hypothyroidism Continue Synthroid.  Dyslipidemia On statin therapy.  Peripheral neuropathy Continue Lyrica.  PT recommended SNF. TOC working on placement.     Subjective: Patient is seen and examined today morning. She denies nausea, pain, dyspnea. Son at bedside states she did not work well yesterday with PT due to anxiety. Eating fair.  Physical Exam: Vitals:   11/28/22 1518 11/29/22 0005 11/29/22 0617 11/29/22 0733  BP: (!) 142/58 (!) 154/66 (!) 155/71 (!) 159/69  Pulse: 92 91 83 80  Resp: 17 20  16   Temp: 98.2 F (36.8 C) 98.5 F (36.9 C)  98.2 F (36.8 C)  TempSrc:      SpO2: 93% 91%  91%  Weight:       Height:       General - Elderly Caucasian female, in distress due to nausea HEENT - PERRLA, EOMI, atraumatic head, non tender sinuses. Lung - bilateral good breath sounds, rales bibasal. Heart - S1, S2 heard, no murmurs, rubs, 1+ pedal edema Neuro - Alert, awake and oriented, non focal exam. Skin - Warm and dry, leg swelling, redness better Data Reviewed:  CBC, BMP  Family Communication: Patient and son at bedside updated about care plan.  Disposition: Status is: Inpatient Remains inpatient appropriate because: SNF placement, safe dispo  Planned Discharge Destination: Skilled nursing facility    Time spent: 43 minutes  Author: Marcelino Duster, MD 11/29/2022 1:18 PM  For on call review [email or web address].

## 2022-11-30 DIAGNOSIS — L03116 Cellulitis of left lower limb: Secondary | ICD-10-CM | POA: Diagnosis not present

## 2022-11-30 DIAGNOSIS — L03115 Cellulitis of right lower limb: Secondary | ICD-10-CM | POA: Diagnosis not present

## 2022-11-30 MED ORDER — SODIUM CHLORIDE 0.9 % IV SOLN: INTRAVENOUS | Status: AC

## 2022-11-30 NOTE — Plan of Care (Signed)

## 2022-11-30 NOTE — Plan of Care (Signed)
  Problem: Clinical Measurements: Goal: Ability to avoid or minimize complications of infection will improve Outcome: Progressing   Problem: Skin Integrity: Goal: Skin integrity will improve Outcome: Progressing   Problem: Education: Goal: Knowledge of General Education information will improve Description: Including pain rating scale, medication(s)/side effects and non-pharmacologic comfort measures Outcome: Progressing   Problem: Health Behavior/Discharge Planning: Goal: Ability to manage health-related needs will improve Outcome: Progressing   Problem: Clinical Measurements: Goal: Will remain free from infection Outcome: Progressing Goal: Cardiovascular complication will be avoided Outcome: Progressing   Problem: Activity: Goal: Risk for activity intolerance will decrease Outcome: Progressing

## 2022-11-30 NOTE — Progress Notes (Signed)
  Progress Note   Patient: Tracie Fisher:[id number] DOB: 08-07-1935 DOA: 11/24/2022     6 DOS: the patient was seen and examined on 11/30/2022     Subjective:  Patient seen and examined this morning in the presence of the son Denies nausea vomiting abdominal pain chest pain or cough She admits to improvement in the lower extremity cellulitis Son is working on getting patient to rehab. I told him this will depend on PTs recommendation I also made him aware that patient's renal function is worse today and so torsemide has been discontinued   Brief hospital course: Tracie Fisher is a 87 y.o. Caucasian female with medical history significant for GERD, depression, hypertension, CKD stage 3, and hypothyroidism, who presented to the emergency room with acute onset of worsening bilateral lower extremity swelling and erythema lately. Patient is admitted for bilateral leg cellulitis, started antibiotics.  Assessment and Plan:   Acute renal failure in the setting of torsemide use Torsemide have been discontinued Gentle IV fluid hydration Will continue to monitor for renal function closely Avoid nephrotoxic agents Renally dose all drugs  * Bilateral lower leg cellulitis Patient failed outpatient antibiotics Has completed 5 days of antibiotic course We will continue lower extremity elevation   Shortness of breath and leg swelling Chest xray with right sided infiltrate vs edema.  Repeat xray from yesterday showed improvement.  Finished antibiotics. Home diuretics discontinued Patient may need TED stockings   Uncontrolled hypertension Continue metoprolol 25mg  BID. Monitor blood pressure closely Continue hydralazine as needed Continue pain control, anxiolytics PRN Continue diuretic therapy with Demadex.   AKI on CKD stage 3a- Monitor daily renal function. Continue management as seen assessment 1  Hypothyroidism Continue Synthroid.   Dyslipidemia Continue statin therapy.    Peripheral neuropathy Continue Lyrica.   PT recommended SNF. TOC working on placement.  Plan of care discussed with patient's son present at bedside  Physical Exam: General -elderly female laying in bed in no distress HEENT - PERRLA, EOMI, atraumatic head, non tender sinuses. Lung -decreased air entry especially at the bases Heart - S1, S2 heard, no murmurs, rubs, 1+ bilateral lower extremity edema Neuro - Alert, awake and oriented, non focal exam. Skin - Warm and dry, leg swelling, redness better   Vitals:   11/29/22 1428 11/29/22 2251 11/30/22 0015 11/30/22 0742  BP: (!) 160/77 (!) 168/81 (!) 145/67 (!) 164/59  Pulse: 80 71 65 66  Resp: 17  19 17   Temp: 98 F (36.7 C)  98.5 F (36.9 C) 98 F (36.7 C)  TempSrc:      SpO2: 94%  94% 92%  Weight:      Height:        Data Reviewed: I reviewed patient's progress note, documentation by nursing staff, patient's labs including CBC and BMP showing creatinine has worsened from 1.2-1.7 today as well as uncontrolled blood sugar level    Author: Loyce Dys, MD 11/30/2022 3:29 PM  For on call review [email or web address].

## 2022-11-30 NOTE — Progress Notes (Signed)
Physical Therapy Treatment Patient Details Name: Tracie Fisher MRN: [id number] DOB: Sep 27, 1935 Today's Date: 11/30/2022   History of Present Illness Pt is an 87 y/o female admitted secondary to bilateral LE edema, found to have cellulitis. PMH including but not limited to GERD, depression, hypertension, and hypothyroidism.    PT Comments  Pt sitting at EOB on arrival, in no acute distress but c/o of generally not feeling well and struggling to expound upon that despite much effort/discussion regarding this.  She was slow and guarded with all aspects of PT but did manage to ambulate ~65 ft with RW with plenty of cuing and encouragement.  After seated rest break she needed to use the bathroom, able to ambulate, again slowly, ~15 ft to the commode and manage underwear control descent onto raised commode w/o direct assist.  Pt proceeded to have to have BM, nursing notified, further PT deferred per pt preference for privacy and have some time.  Pt will benefit from continued PT, slow gains and remains far from her baseline, d/c plans updated.    Assistance Recommended at Discharge Frequent or constant Supervision/Assistance  If plan is discharge home, recommend the following:  Can travel by private vehicle    A little help with walking and/or transfers;A little help with bathing/dressing/bathroom;Assistance with cooking/housework;Assist for transportation;Help with stairs or ramp for entrance;Direct supervision/assist for medications management   Yes  Equipment Recommendations  Rolling walker (2 wheels)    Recommendations for Other Services       Precautions / Restrictions Precautions Precautions: Fall Restrictions Weight Bearing Restrictions: No     Mobility  Bed Mobility               General bed mobility comments: in recliner on arrival, on commode post session    Transfers Overall transfer level: Needs assistance Equipment used: Rolling walker (2 wheels) Transfers: Sit  to/from Stand Sit to Stand: Min assist           General transfer comment: Pt needing cuing for UE use and set up, able to rise with only CGA and consistent cuing    Ambulation/Gait Ambulation/Gait assistance: Min guard Gait Distance (Feet): 65 Feet Assistive device: Rolling walker (2 wheels)         General Gait Details: Pt with slow, guarded but relatively safe ambulation using RW.  Her HR remained stable in the 70s and O2 remained mid/low 90s on room air.  Slow, guarded but consistent cadence.   Stairs             Wheelchair Mobility     Tilt Bed    Modified Rankin (Stroke Patients Only)       Balance Overall balance assessment: Needs assistance Sitting-balance support: Feet supported Sitting balance-Leahy Scale: Good     Standing balance support: Bilateral upper extremity supported, During functional activity, Reliant on assistive device for balance Standing balance-Leahy Scale: Good Standing balance comment: appropriate reliance on walker, able to pull underwear down w/o UE support                            Cognition Arousal/Alertness: Awake/alert Behavior During Therapy: Anxious Overall Cognitive Status: Impaired/Different from baseline                                 General Comments: slow or unable to appropriately to respond with most questioning. Flat affect. anxious appearing.  Exercises      General Comments General comments (skin integrity, edema, etc.): Pt repeating that she feels poorly (per son this has been the case since arrival) but unable to expound upon that      Pertinent Vitals/Pain Pain Assessment Pain Assessment: Faces Faces Pain Scale: Hurts little more Pain Location: endorses low back pain, non-descript    Home Living                          Prior Function            PT Goals (current goals can now be found in the care plan section) Progress towards PT goals:  Progressing toward goals    Frequency    Min 1X/week      PT Plan Discharge plan needs to be updated    Co-evaluation              AM-PAC PT "6 Clicks" Mobility   Outcome Measure  Help needed turning from your back to your side while in a flat bed without using bedrails?: A Little Help needed moving from lying on your back to sitting on the side of a flat bed without using bedrails?: A Little Help needed moving to and from a bed to a chair (including a wheelchair)?: A Little Help needed standing up from a chair using your arms (e.g., wheelchair or bedside chair)?: A Little Help needed to walk in hospital room?: A Little Help needed climbing 3-5 steps with a railing? : A Lot 6 Click Score: 17    End of Session Equipment Utilized During Treatment: Gait belt Activity Tolerance: Patient tolerated treatment well Patient left: in chair;with call bell/phone within reach;with family/visitor present (on commode, nursing aware) Nurse Communication: Mobility status PT Visit Diagnosis: Muscle weakness (generalized) (M62.81);Other abnormalities of gait and mobility (R26.89);Difficulty in walking, not elsewhere classified (R26.2)     Time: 1610-9604 PT Time Calculation (min) (ACUTE ONLY): 28 min  Charges:    $Gait Training: 8-22 mins $Therapeutic Activity: 8-22 mins PT General Charges $$ ACUTE PT VISIT: 1 Visit                     Malachi Pro, DPT 11/30/2022, 4:23 PM

## 2022-12-01 DIAGNOSIS — L03116 Cellulitis of left lower limb: Secondary | ICD-10-CM | POA: Diagnosis not present

## 2022-12-01 DIAGNOSIS — L03115 Cellulitis of right lower limb: Secondary | ICD-10-CM | POA: Diagnosis not present

## 2022-12-01 LAB — BASIC METABOLIC PANEL
Anion gap: 9 (ref 5–15)
BUN: 24 mg/dL — ABNORMAL HIGH (ref 8–23)
CO2: 26 mmol/L (ref 22–32)
Calcium: 8.3 mg/dL — ABNORMAL LOW (ref 8.9–10.3)
Chloride: 96 mmol/L — ABNORMAL LOW (ref 98–111)
Creatinine, Ser: 1.27 mg/dL — ABNORMAL HIGH (ref 0.44–1.00)
GFR, Estimated: 41 mL/min — ABNORMAL LOW (ref >60)
Glucose, Bld: 78 mg/dL (ref 70–99)
Potassium: 3 mmol/L — ABNORMAL LOW (ref 3.5–5.1)
Sodium: 131 mmol/L — ABNORMAL LOW (ref 135–145)

## 2022-12-01 LAB — CBC WITH DIFFERENTIAL/PLATELET
Abs Immature Granulocytes: 0.03 10*3/uL (ref 0.00–0.07)
Basophils Absolute: 0.1 10*3/uL (ref 0.0–0.1)
Basophils Relative: 1 %
Eosinophils Absolute: 0.2 10*3/uL (ref 0.0–0.5)
Eosinophils Relative: 3 %
HCT: 32.9 % — ABNORMAL LOW (ref 36.0–46.0)
Hemoglobin: 10.8 g/dL — ABNORMAL LOW (ref 12.0–15.0)
Immature Granulocytes: 0 %
Lymphocytes Relative: 22 %
Lymphs Abs: 1.6 10*3/uL (ref 0.7–4.0)
MCH: 30.8 pg (ref 26.0–34.0)
MCHC: 32.8 g/dL (ref 30.0–36.0)
MCV: 93.7 fL (ref 80.0–100.0)
Monocytes Absolute: 0.9 10*3/uL (ref 0.1–1.0)
Monocytes Relative: 13 %
Neutro Abs: 4.4 10*3/uL (ref 1.7–7.7)
Neutrophils Relative %: 61 %
Platelets: 298 10*3/uL (ref 150–400)
RBC: 3.51 MIL/uL — ABNORMAL LOW (ref 3.87–5.11)
RDW: 12.9 % (ref 11.5–15.5)
WBC: 7.2 10*3/uL (ref 4.0–10.5)
nRBC: 0 % (ref 0.0–0.2)

## 2022-12-01 MED ORDER — POTASSIUM CHLORIDE CRYS ER 20 MEQ PO TBCR
40.0000 meq | EXTENDED_RELEASE_TABLET | ORAL | Status: DC
  Filled 2022-12-01: qty 2

## 2022-12-01 MED ORDER — POTASSIUM CHLORIDE 20 MEQ PO PACK
40.0000 meq | PACK | Freq: Two times a day (BID) | ORAL | Status: AC
  Administered 2022-12-01 (×2): 40 meq via ORAL
  Filled 2022-12-01 (×2): qty 2

## 2022-12-01 NOTE — Plan of Care (Signed)
  Problem: Clinical Measurements: Goal: Ability to avoid or minimize complications of infection will improve Outcome: Progressing   Problem: Skin Integrity: Goal: Skin integrity will improve Outcome: Progressing   Problem: Education: Goal: Knowledge of General Education information will improve Description: Including pain rating scale, medication(s)/side effects and non-pharmacologic comfort measures Outcome: Progressing   Problem: Health Behavior/Discharge Planning: Goal: Ability to manage health-related needs will improve Outcome: Progressing   Problem: Activity: Goal: Risk for activity intolerance will decrease Outcome: Progressing   Problem: Coping: Goal: Level of anxiety will decrease Outcome: Progressing   Problem: Pain Managment: Goal: General experience of comfort will improve Outcome: Progressing   Problem: Safety: Goal: Ability to remain free from injury will improve Outcome: Progressing

## 2022-12-01 NOTE — Progress Notes (Signed)
  Progress Note   Patient: Tracie Fisher UUV:[id number] DOB: [birth year] DOA: 11/24/2022     7 DOS: the patient was seen and examined on 12/01/2022    Subjective:  Patient seen and examined at bedside this morning Currently requiring 2 L of intranasal oxygen Denies worsening respiratory function chest pain cough or urinary complaints     Brief hospital course: KATILYN MILTENBERGER is a 87 y.o. Caucasian female with medical history significant for GERD, depression, hypertension, CKD stage 3, and hypothyroidism, who presented to the emergency room with acute onset of worsening bilateral lower extremity swelling and erythema lately. Patient is admitted for bilateral leg cellulitis, started antibiotics.   Assessment and Plan:   Acute renal failure in the setting of torsemide use Torsemide have been discontinued Has completed IV hydration Renal function improved Monitor input and output Avoid nephrotoxic agents   * Bilateral lower leg cellulitis Patient failed outpatient antibiotics Completed 5 days of antibiotic therapy inpatient We will continue lower extremity elevation   Shortness of breath and leg swelling Chest xray with right sided infiltrate vs edema.  Repeat xray from yesterday showed improvement.  Finished antibiotics. Continue to hold torsemide Patient may need TED stockings   Uncontrolled hypertension Continue metoprolol 25mg  BID. Monitor blood pressure closely Continue hydralazine as needed Continue pain control, anxiolytics PRN Continue diuretic therapy with Demadex.   AKI on CKD stage 3a-improved Monitor daily renal function. Continue management as seen assessment 1   Hypothyroidism Continue Synthroid   Dyslipidemia Continue statin therapy   Peripheral neuropathy Continue Lyrica.   PT recommended SNF. TOC working on placement.   Plan of care discussed with patient's son present at bedside   Physical Exam: General -elderly female laying in bed in no  distress HEENT - PERRLA, EOMI, atraumatic head, non tender sinuses. Lung -decreased air entry especially at the bases Heart - S1, S2 heard, no murmurs, rubs, 1+ bilateral lower extremity edema Neuro - Alert, awake and oriented, non focal exam. Skin - Warm and dry, leg swelling, redness better     Data Reviewed: CBC, BMP results showing potassium 3.0 creatinine improved from 1.7-1.2  Vitals:   12/01/22 0306 12/01/22 0620 12/01/22 0742 12/01/22 1450  BP: (!) 161/61 (!) 152/57 (!) 157/59 (!) 184/72  Pulse: 72 67 68 74  Resp:   17 16  Temp:   98.8 F (37.1 C) 98.3 F (36.8 C)  TempSrc:      SpO2:   93% 97%  Weight:      Height:         Author: Loyce Dys, MD 12/01/2022 5:30 PM  For on call review [email or web address].

## 2022-12-01 NOTE — TOC Progression Note (Signed)
Transition of Care [facility]) - Progression Note    Patient Details  Name: Tracie Fisher MRN: [id number] Date of Birth: 05-Dec-1935  [facility] CM/SW Contact  Garret Reddish, RN Phone Number: 12/01/2022, 2:50 PM  Clinical Narrative:   Bed offers reviewed with patient's son Dannie.  He will reviewed and follow back up with me on this decision.    Noted watching renal function closely.    TOC will continue to follow for discharge planning.      Expected Discharge Plan: Skilled Nursing Facility Barriers to Discharge: No Barriers Identified  Expected Discharge Plan and Services   Discharge Planning Services: CM Consult Post Acute Care Choice: Skilled Nursing Facility Living arrangements for the past 2 months: Single Family Home                                       Social Determinants of Health (SDOH) Interventions SDOH Screenings   Food Insecurity: No Food Insecurity (11/25/2022)  Housing: Low Risk  (11/25/2022)  Transportation Needs: No Transportation Needs (11/25/2022)  Utilities: Not At Risk (11/25/2022)  Alcohol Screen: Low Risk  (09/13/2022)  Depression (PHQ2-9): Medium Risk (11/10/2022)  Financial Resource Strain: Low Risk  (09/13/2022)  Physical Activity: Insufficiently Active (09/13/2022)  Social Connections: Socially Isolated (09/13/2022)  Stress: No Stress Concern Present (09/13/2022)  Tobacco Use: Medium Risk (11/25/2022)    Readmission Risk Interventions     No data to display

## 2022-12-01 NOTE — Plan of Care (Signed)
Patient A&Ox4, from home, up with assist in room. Patient c/o headache at beginning of shift, tylenol given. Patient also states that she didn't get much sleep the night before. This RN advised patient her PTA prescribed Xanax may help with her anxiety symptoms and help her sleep. Xanax given.

## 2022-12-02 ENCOUNTER — Ambulatory Visit: Payer: 59 | Admitting: Physician Assistant

## 2022-12-02 DIAGNOSIS — L03116 Cellulitis of left lower limb: Secondary | ICD-10-CM | POA: Diagnosis not present

## 2022-12-02 DIAGNOSIS — L03115 Cellulitis of right lower limb: Secondary | ICD-10-CM | POA: Diagnosis not present

## 2022-12-02 LAB — BASIC METABOLIC PANEL
Anion gap: 12 (ref 5–15)
BUN: 20 mg/dL (ref 8–23)
CO2: 23 mmol/L (ref 22–32)
Calcium: 8.8 mg/dL — ABNORMAL LOW (ref 8.9–10.3)
Chloride: 99 mmol/L (ref 98–111)
Creatinine, Ser: 1.17 mg/dL — ABNORMAL HIGH (ref 0.44–1.00)
GFR, Estimated: 46 mL/min — ABNORMAL LOW (ref >60)
Glucose, Bld: 96 mg/dL (ref 70–99)
Potassium: 3.8 mmol/L (ref 3.5–5.1)
Sodium: 134 mmol/L — ABNORMAL LOW (ref 135–145)

## 2022-12-02 LAB — CBC WITH DIFFERENTIAL/PLATELET
Abs Immature Granulocytes: 0.03 10*3/uL (ref 0.00–0.07)
Basophils Absolute: 0 10*3/uL (ref 0.0–0.1)
Basophils Relative: 1 %
Eosinophils Absolute: 0.1 10*3/uL (ref 0.0–0.5)
Eosinophils Relative: 1 %
HCT: 34.2 % — ABNORMAL LOW (ref 36.0–46.0)
Hemoglobin: 11.8 g/dL — ABNORMAL LOW (ref 12.0–15.0)
Immature Granulocytes: 0 %
Lymphocytes Relative: 20 %
Lymphs Abs: 1.7 10*3/uL (ref 0.7–4.0)
MCH: 31.5 pg (ref 26.0–34.0)
MCHC: 34.5 g/dL (ref 30.0–36.0)
MCV: 91.2 fL (ref 80.0–100.0)
Monocytes Absolute: 0.9 10*3/uL (ref 0.1–1.0)
Monocytes Relative: 10 %
Neutro Abs: 5.9 10*3/uL (ref 1.7–7.7)
Neutrophils Relative %: 68 %
Platelets: 317 10*3/uL (ref 150–400)
RBC: 3.75 MIL/uL — ABNORMAL LOW (ref 3.87–5.11)
RDW: 13 % (ref 11.5–15.5)
WBC: 8.6 10*3/uL (ref 4.0–10.5)
nRBC: 0 % (ref 0.0–0.2)

## 2022-12-02 MED ORDER — ENOXAPARIN SODIUM 40 MG/0.4ML IJ SOSY
40.0000 mg | PREFILLED_SYRINGE | INTRAMUSCULAR | Status: DC
  Administered 2022-12-02 – 2022-12-04 (×3): 40 mg via SUBCUTANEOUS
  Filled 2022-12-02 (×3): qty 0.4

## 2022-12-02 NOTE — Progress Notes (Signed)
Mobility Specialist - Progress Note     12/02/22 1636  Mobility  Activity Ambulated with assistance in hallway  Level of Assistance Contact guard assist, steadying assist  Assistive Device Front wheel walker  Distance Ambulated (ft) 140 ft  Range of Motion/Exercises Active  Activity Response Tolerated well  Mobility Referral Yes  $Mobility charge 1 Mobility  Mobility Specialist Start Time (ACUTE ONLY) 1604  Mobility Specialist Stop Time (ACUTE ONLY) 1628  Mobility Specialist Time Calculation (min) (ACUTE ONLY) 24 min   Pt resting in bed on RA upon entry. Pt STS MinA and ambulates to hallway around NS CGA with RW. Pt returned to bed and left with needs in reach. Bed alarm activated.   Johnathan Hausen Mobility Specialist 12/02/22, 4:40 PM

## 2022-12-02 NOTE — Progress Notes (Signed)
Physical Therapy Treatment Patient Details Name: WESTLYNN FIFER MRN: [id number] DOB: 1936-02-20 Today's Date: 12/02/2022   History of Present Illness Pt is an 87 y/o female admitted secondary to bilateral LE edema, found to have cellulitis. PMH including but not limited to GERD, depression, hypertension, and hypothyroidism.    PT Comments  Patient supine in bed upon arrival, reports feeling tired. Denies pain. Sisters at bedside. Agreeable to PT tx session with encouragement from PT and family members. Patient able to ambulate with RW approx 80 ft with CGA. Patient very anxious with ambulation, HR and Sp02 stable throughout activity. PT encouraged for additional activity, but patient refused due to fatigue. Returned to supine in bed with all needs met and alarm set, family remains at bed side. Patient will continue to benefit from continued skilled PT services. Will follow acutely.     Assistance Recommended at Discharge Frequent or constant Supervision/Assistance  If plan is discharge home, recommend the following:  Can travel by private vehicle    A little help with walking and/or transfers;A little help with bathing/dressing/bathroom;Assistance with cooking/housework;Assist for transportation;Help with stairs or ramp for entrance;Direct supervision/assist for medications management   Yes  Equipment Recommendations  Rolling walker (2 wheels)    Recommendations for Other Services       Precautions / Restrictions Precautions Precautions: Fall Restrictions Weight Bearing Restrictions: No     Mobility  Bed Mobility Overal bed mobility: Needs Assistance Bed Mobility: Supine to Sit, Sit to Supine     Supine to sit: Min guard Sit to supine: Modified independent (Device/Increase time), Min guard   General bed mobility comments: Pt require CGA and mild assist to scoot to EOB. Increased time required.    Transfers Overall transfer level: Needs assistance Equipment used: Rolling  walker (2 wheels) Transfers: Sit to/from Stand Sit to Stand: Min guard           General transfer comment: Cues for hand placement able to rise with only CGA    Ambulation/Gait Ambulation/Gait assistance: Min guard Gait Distance (Feet): 80 Feet Assistive device: Rolling walker (2 wheels) Gait Pattern/deviations: Step-through pattern, Decreased step length - right, Decreased step length - left, Decreased stride length Gait velocity: decreased     General Gait Details: Pt with slow, guarded gait frequent distraction from surrounding environment requiring cues to focus on task; but relatively safe ambulation using RW. CGA throughout. HR remained stable in the low 80s, and O2 remained mid 90s on room air..   Stairs             Wheelchair Mobility     Tilt Bed    Modified Rankin (Stroke Patients Only)       Balance Overall balance assessment: Needs assistance Sitting-balance support: Feet supported Sitting balance-Leahy Scale: Good     Standing balance support: Bilateral upper extremity supported, During functional activity, Reliant on assistive device for balance Standing balance-Leahy Scale: Good Standing balance comment: good standing balance, no LOB noted with use of RW                            Cognition Arousal/Alertness: Awake/alert Behavior During Therapy: Anxious Overall Cognitive Status: Impaired/Different from baseline Area of Impairment: Awareness, Problem solving                               General Comments: Flat affect. Anxious with all aspects of mobility. Slow  respones or no response providing with some questioning during session        Exercises      General Comments        Pertinent Vitals/Pain Pain Assessment Pain Assessment: No/denies pain    Home Living                          Prior Function            PT Goals (current goals can now be found in the care plan section) Acute Rehab PT  Goals Patient Stated Goal: to feel better PT Goal Formulation: With patient/family Time For Goal Achievement: 12/10/22 Potential to Achieve Goals: Good Progress towards PT goals: Progressing toward goals    Frequency    Min 1X/week      PT Plan Current plan remains appropriate    Co-evaluation              AM-PAC PT "6 Clicks" Mobility   Outcome Measure  Help needed turning from your back to your side while in a flat bed without using bedrails?: A Little Help needed moving from lying on your back to sitting on the side of a flat bed without using bedrails?: A Little Help needed moving to and from a bed to a chair (including a wheelchair)?: A Little Help needed standing up from a chair using your arms (e.g., wheelchair or bedside chair)?: A Little Help needed to walk in hospital room?: A Little Help needed climbing 3-5 steps with a railing? : A Lot 6 Click Score: 17    End of Session Equipment Utilized During Treatment: Gait belt Activity Tolerance: Patient tolerated treatment well Patient left: with family/visitor present;in bed;with bed alarm set (sister at bedside) Nurse Communication: Mobility status PT Visit Diagnosis: Muscle weakness (generalized) (M62.81);Other abnormalities of gait and mobility (R26.89);Difficulty in walking, not elsewhere classified (R26.2)     Time: 1353-1410 PT Time Calculation (min) (ACUTE ONLY): 17 min  Charges:    $Gait Training: 8-22 mins PT General Charges $$ ACUTE PT VISIT: 1 Visit                     Howie Ill, PT, DPT 12/02/22 2:39 PM

## 2022-12-02 NOTE — Care Management Important Message (Signed)
Important Message  Patient Details  Name: EMYA PICADO MRN: [id number] Date of Birth: [birth year]   Medicare Important Message Given:  Yes     Olegario Messier A Adolphus Hanf 12/02/2022, 3:23 PM

## 2022-12-02 NOTE — TOC Progression Note (Signed)
Transition of Care [facility]) - Progression Note    Patient Details  Name: NASHANTI DUQUETTE MRN: [id number] Date of Birth: Jun 18, 1935  Transition of [facility] CM/SW Contact  Garret Reddish, RN Phone Number: 12/02/2022, 3:14 PM  Clinical Narrative:   Mrs. Kugler and her son have accepted bed offer at [facility].  I have informed Tanya, Admission Coordinator at [facility] that patient has accepted bed offer.  I have informed Kenney Houseman that I will start insurance authorization.    SNF authorization completed.  SNF authorizaton pending.  Pending Auth ID number is [id number].    Kenney Houseman reports that [facility] can accept patient to SNF.    TOC will continue to follow for discharge planning.     Expected Discharge Plan: Skilled Nursing Facility Barriers to Discharge: No Barriers Identified  Expected Discharge Plan and Services   Discharge Planning Services: CM Consult Post Acute Care Choice: Skilled Nursing Facility Living arrangements for the past 2 months: Single Family Home                                       Social Determinants of Health (SDOH) Interventions SDOH Screenings   Food Insecurity: No Food Insecurity (11/25/2022)  Housing: Low Risk  (11/25/2022)  Transportation Needs: No Transportation Needs (11/25/2022)  Utilities: Not At Risk (11/25/2022)  Alcohol Screen: Low Risk  (09/13/2022)  Depression (PHQ2-9): Medium Risk (11/10/2022)  Financial Resource Strain: Low Risk  (09/13/2022)  Physical Activity: Insufficiently Active (09/13/2022)  Social Connections: Socially Isolated (09/13/2022)  Stress: No Stress Concern Present (09/13/2022)  Tobacco Use: Medium Risk (11/25/2022)    Readmission Risk Interventions     No data to display

## 2022-12-02 NOTE — Progress Notes (Signed)
  Progress Note   Patient: Tracie Fisher ZOX:[id number] DOB: 1935/06/18 DOA: 11/24/2022     8 DOS: the patient was seen and examined on 12/02/2022     Subjective:  Patient seen and examined at bedside this morning, she is off oxygen, she denies nausea vomiting abdominal pain chest pain or cough  Transition of care manager on board and working on placementme chest pain   Brief hospital course: Tracie Fisher is a 87 y.o. Caucasian female with medical history significant for GERD, depression, hypertension, CKD stage 3, and hypothyroidism, who presented to the emergency room with acute onset of worsening bilateral lower extremity swelling and erythema lately. Patient is admitted for bilateral leg cellulitis, started antibiotics.   Assessment and Plan:   Acute renal failure in the setting of torsemide use Torsemide have been discontinued Has completed IV hydration Renal function continues to improve We will continue Monitor input and output We will continue to avoid nephrotoxic agents   * Bilateral lower leg cellulitis Patient failed outpatient antibiotics Completed 5 days of antibiotic therapy inpatient We will continue lower extremity elevation   Shortness of breath and leg swelling Chest xray with right sided infiltrate vs edema.  Repeat xray from yesterday showed improvement.  Finished antibiotics. Continue to hold torsemide Patient may need TED stockings   Uncontrolled hypertension We will continue current medications as listed as listed metoprolol 25mg  BID. Monitor blood pressure closely Continue hydralazine as needed Continue pain control, anxiolytics PRN Continue diuretic therapy with Demadex.   AKI on CKD stage 3a-improved Monitor daily renal function. We will continue management as seen assessment 1   Hypothyroidism Will continue Synthroid   Dyslipidemia We will continue statin therapy   Peripheral neuropathy Continue Lyrica   PT recommended SNF. TOC  working on placement.   Family communication: Plan of care discussed with patient's family   Physical Exam: General -elderly female lying in bed in no acute distress HEENT - PERRLA, EOMI, atraumatic head, non tender sinuses. Lung -decreased air entry especially at the bases Heart - S1, S2 heard, no murmurs, rubs, 1+ bilateral lower extremity edema Neuro - Alert, awake and oriented, non focal exam. Skin - Warm and dry, leg swelling, redness better      Data Reviewed: I have reviewed patient's CBC, BMP as well as social services documentation and transition of care manager documentation   Vitals:   12/01/22 0742 12/01/22 1450 12/01/22 1847 12/01/22 2354  BP: (!) 157/59 (!) 184/72 (!) 112/40 (!) 153/53  Pulse: 68 74 77 73  Resp: 17 16 15 18   Temp: 98.8 F (37.1 C) 98.3 F (36.8 C)  98.1 F (36.7 C)  TempSrc:      SpO2: 93% 97% 96% 94%  Weight:      Height:         Author: Loyce Dys, MD 12/02/2022 3:42 PM  For on call review [email or web address].

## 2022-12-02 NOTE — Plan of Care (Signed)

## 2022-12-03 DIAGNOSIS — L03115 Cellulitis of right lower limb: Secondary | ICD-10-CM | POA: Diagnosis not present

## 2022-12-03 DIAGNOSIS — L03116 Cellulitis of left lower limb: Secondary | ICD-10-CM | POA: Diagnosis not present

## 2022-12-03 LAB — CBC WITH DIFFERENTIAL/PLATELET
Abs Immature Granulocytes: 0.02 10*3/uL (ref 0.00–0.07)
Basophils Absolute: 0 10*3/uL (ref 0.0–0.1)
Basophils Relative: 1 %
Eosinophils Absolute: 0.2 10*3/uL (ref 0.0–0.5)
Eosinophils Relative: 3 %
HCT: 34.4 % — ABNORMAL LOW (ref 36.0–46.0)
Hemoglobin: 11.2 g/dL — ABNORMAL LOW (ref 12.0–15.0)
Immature Granulocytes: 0 %
Lymphocytes Relative: 21 %
Lymphs Abs: 1.4 10*3/uL (ref 0.7–4.0)
MCH: 30.7 pg (ref 26.0–34.0)
MCHC: 32.6 g/dL (ref 30.0–36.0)
MCV: 94.2 fL (ref 80.0–100.0)
Monocytes Absolute: 0.8 10*3/uL (ref 0.1–1.0)
Monocytes Relative: 12 %
Neutro Abs: 4 10*3/uL (ref 1.7–7.7)
Neutrophils Relative %: 63 %
Platelets: 298 10*3/uL (ref 150–400)
RBC: 3.65 MIL/uL — ABNORMAL LOW (ref 3.87–5.11)
RDW: 13.1 % (ref 11.5–15.5)
WBC: 6.4 10*3/uL (ref 4.0–10.5)
nRBC: 0 % (ref 0.0–0.2)

## 2022-12-03 LAB — BASIC METABOLIC PANEL
Anion gap: 8 (ref 5–15)
BUN: 19 mg/dL (ref 8–23)
CO2: 24 mmol/L (ref 22–32)
Calcium: 8.6 mg/dL — ABNORMAL LOW (ref 8.9–10.3)
Chloride: 99 mmol/L (ref 98–111)
Creatinine, Ser: 1.14 mg/dL — ABNORMAL HIGH (ref 0.44–1.00)
GFR, Estimated: 47 mL/min — ABNORMAL LOW (ref >60)
Glucose, Bld: 91 mg/dL (ref 70–99)
Potassium: 3.9 mmol/L (ref 3.5–5.1)
Sodium: 131 mmol/L — ABNORMAL LOW (ref 135–145)

## 2022-12-03 NOTE — Plan of Care (Signed)
  Problem: Education: Goal: Knowledge of General Education information will improve Description: Including pain rating scale, medication(s)/side effects and non-pharmacologic comfort measures Outcome: Progressing   Problem: Nutrition: Goal: Adequate nutrition will be maintained Outcome: Progressing   Problem: Activity: Goal: Risk for activity intolerance will decrease Outcome: Progressing   Problem: Safety: Goal: Ability to remain free from injury will improve Outcome: Progressing   Problem: Skin Integrity: Goal: Risk for impaired skin integrity will decrease Outcome: Progressing   Problem: Pain Managment: Goal: General experience of comfort will improve Outcome: Progressing

## 2022-12-03 NOTE — Progress Notes (Signed)
  Progress Note   Patient: Tracie Fisher UJW:[id number] DOB: 1935-07-18 DOA: 11/24/2022     9 DOS: the patient was seen and examined on 12/03/2022      Subjective:  Patient seen and examined at bedside this morning Denies nausea vomiting abdominal pain chest pain cough Continues to await skilled nursing facility bed placement  Brief hospital course: Tracie Fisher is a 87 y.o. Caucasian female with medical history significant for GERD, depression, hypertension, CKD stage 3, and hypothyroidism, who presented to the emergency room with acute onset of worsening bilateral lower extremity swelling and erythema lately. Patient is admitted for bilateral leg cellulitis, started antibiotics.   Assessment and Plan:   Acute renal failure in the setting of torsemide use Torsemide have been discontinued Has completed IV hydration Renal function continues to improve Continue monitor input and output Continue to avoid nephrotoxic agents   * Bilateral lower leg cellulitis Patient failed outpatient antibiotics Completed 5 days of antibiotic therapy inpatient We will continue lower extremity elevation   Shortness of breath and leg swelling Chest xray with right sided infiltrate vs edema.  Repeat xray from yesterday showed improvement.  Finished antibiotics. Continue to hold torsemide Patient may need TED stockings   Uncontrolled hypertension We will continue current medications as listed as listed metoprolol 25mg  BID. Monitor blood pressure closely Continue hydralazine as needed Continue pain control, anxiolytics PRN Continue diuretic therapy with Demadex.   AKI on CKD stage 3a-improved Monitor daily renal function. Continue management as seen assessment 1   Hypothyroidism Continue Synthroid   Dyslipidemia Continue statin therapy   Peripheral neuropathy Continue Lyrica   PT recommended SNF. TOC working on placement.   Family communication: Plan of care discussed with patient's  family   Physical Exam: General laying in bed in no acute distress HEENT - PERRLA, EOMI, atraumatic head, non tender sinuses. Lung -decreased air entry especially at the bases Heart - S1, S2 heard, no murmurs, rubs, 1+ bilateral lower extremity edema Neuro - Alert, awake and oriented, non focal exam. Skin - Warm and dry, leg swelling, redness better      Data Reviewed: I have reviewed patient's CBC, BMP, case manager documentation as well as nursing documentation  Vitals:   12/03/22 0008 12/03/22 0803 12/03/22 0935 12/03/22 1644  BP: (!) 163/64 (!) 181/71 (!) 160/64 (!) 181/68  Pulse: 70 70 84 71  Resp: 18 19  20   Temp: 97.8 F (36.6 C) 98.3 F (36.8 C)  98.2 F (36.8 C)  TempSrc:  Oral    SpO2: 94% 95%  96%  Weight:      Height:         Author: Loyce Dys, MD 12/03/2022 5:19 PM  For on call review [email or web address].

## 2022-12-03 NOTE — Progress Notes (Signed)
Mobility Specialist - Progress Note    12/03/22 1627  Mobility  Activity Ambulated with assistance in hallway  Level of Assistance Contact guard assist, steadying assist  Assistive Device Front wheel walker  Distance Ambulated (ft) 140 ft  Range of Motion/Exercises Active  Activity Response Tolerated well  Mobility Referral Yes  $Mobility charge 1 Mobility  Mobility Specialist Start Time (ACUTE ONLY) 1558  Mobility Specialist Stop Time (ACUTE ONLY) 1622  Mobility Specialist Time Calculation (min) (ACUTE ONLY) 24 min   Pt resting in bed on RA upon entry. Pt STS and ambulates to hallway around NS CGA with RW. Pt returned to bed and left with needs in reach. Pt had heavy breathing throughout the session but O2 stat remained above 92.   Johnathan Hausen Mobility Specialist 12/03/22, 4:41 PM

## 2022-12-03 NOTE — Plan of Care (Signed)
Patient A&Ox4, from home, independent in room  

## 2022-12-04 DIAGNOSIS — L03115 Cellulitis of right lower limb: Secondary | ICD-10-CM | POA: Diagnosis not present

## 2022-12-04 DIAGNOSIS — L03116 Cellulitis of left lower limb: Secondary | ICD-10-CM | POA: Diagnosis not present

## 2022-12-04 LAB — BASIC METABOLIC PANEL
Anion gap: 9 (ref 5–15)
BUN: 19 mg/dL (ref 8–23)
CO2: 23 mmol/L (ref 22–32)
Calcium: 8.7 mg/dL — ABNORMAL LOW (ref 8.9–10.3)
Chloride: 99 mmol/L (ref 98–111)
Creatinine, Ser: 1.11 mg/dL — ABNORMAL HIGH (ref 0.44–1.00)
GFR, Estimated: 49 mL/min — ABNORMAL LOW (ref >60)
Glucose, Bld: 91 mg/dL (ref 70–99)
Potassium: 3.9 mmol/L (ref 3.5–5.1)
Sodium: 131 mmol/L — ABNORMAL LOW (ref 135–145)

## 2022-12-04 MED ORDER — AMLODIPINE BESYLATE 5 MG PO TABS
5.0000 mg | ORAL_TABLET | Freq: Every day | ORAL | Status: DC
  Administered 2022-12-04 – 2022-12-05 (×2): 5 mg via ORAL
  Filled 2022-12-04 (×2): qty 1

## 2022-12-04 NOTE — TOC Progression Note (Signed)
Transition of Care [facility]) - Progression Note    Patient Details  Name: Tracie Fisher MRN: [id number] Date of Birth: 18-Dec-1935  Transition of Care [facility]) CM/SW Contact  Garret Reddish, RN Phone Number: 12/04/2022, 2:49 PM  Clinical Narrative:   Chart reviewed.  SNF authorization has been approved.  Plan auth ID number is [id number], Auth ID -[id number].  Approval date is from 12-03-22- 12-06-2022.  Next review date is 12-06-22.  I have spoken with Kenney Houseman from [facility] and she can accept patient to the facility on tomorrow.    I have informed patient's son of the above information.    Expected Discharge Plan: Skilled Nursing Facility Barriers to Discharge: No Barriers Identified  Expected Discharge Plan and Services   Discharge Planning Services: CM Consult Post Acute Care Choice: Skilled Nursing Facility Living arrangements for the past 2 months: Single Family Home                                       Social Determinants of Health (SDOH) Interventions SDOH Screenings   Food Insecurity: No Food Insecurity (11/25/2022)  Housing: Low Risk  (11/25/2022)  Transportation Needs: No Transportation Needs (11/25/2022)  Utilities: Not At Risk (11/25/2022)  Alcohol Screen: Low Risk  (09/13/2022)  Depression (PHQ2-9): Medium Risk (11/10/2022)  Financial Resource Strain: Low Risk  (09/13/2022)  Physical Activity: Insufficiently Active (09/13/2022)  Social Connections: Socially Isolated (09/13/2022)  Stress: No Stress Concern Present (09/13/2022)  Tobacco Use: Medium Risk (11/25/2022)    Readmission Risk Interventions     No data to display

## 2022-12-04 NOTE — Plan of Care (Signed)
  Problem: Skin Integrity: Goal: Skin integrity will improve Outcome: Progressing   Problem: Education: Goal: Knowledge of General Education information will improve Description: Including pain rating scale, medication(s)/side effects and non-pharmacologic comfort measures Outcome: Progressing   Problem: Clinical Measurements: Goal: Ability to maintain clinical measurements within normal limits will improve Outcome: Progressing

## 2022-12-04 NOTE — Progress Notes (Signed)
  Progress Note   Patient: Tracie Fisher:[id number] DOB: 03/09/1936 DOA: 11/24/2022     10 DOS: the patient was seen and examined on 12/04/2022     Subjective:  Patient seen and examined at bedside this morning Denies any acute overnight complaints or events Awaiting skilled nursing facility placement tomorrow   Brief hospital course: Tracie Fisher is a 87 y.o. Caucasian female with medical history significant for GERD, depression, hypertension, CKD stage 3, and hypothyroidism, who presented to the emergency room with acute onset of worsening bilateral lower extremity swelling and erythema lately. Patient is admitted for bilateral leg cellulitis, started antibiotics.   Assessment and Plan:   Acute renal failure in the setting of torsemide use Torsemide have been discontinued Has completed IV hydration Renal function continues to improve Continue monitor input and output Continue to avoid nephrotoxic agents   * Bilateral lower leg cellulitis Patient failed outpatient antibiotics Completed 5 days of antibiotic therapy inpatient We will continue lower extremity elevation   Shortness of breath and leg swelling Chest xray with right sided infiltrate vs edema.  Repeat xray from yesterday showed improvement.  Finished antibiotics. Continue to hold torsemide Patient may need TED stockings   Uncontrolled hypertension We will continue current medications as listed as listed metoprolol 25mg  BID. Monitor blood pressure closely Continue hydralazine as needed Continue pain control, anxiolytics PRN Continue diuretic therapy with Demadex.   AKI on CKD stage 3a-improved Monitor daily renal function. Continue management as seen assessment 1   Hypothyroidism Continue Synthroid   Dyslipidemia Continue statin therapy   Peripheral neuropathy Continue Lyrica   PT recommended SNF. TOC working on placement.   Family communication: Plan of care discussed with patient's family    Physical Exam: General laying in bed in no acute distress HEENT - PERRLA, EOMI, atraumatic head, non tender sinuses. Lung -decreased air entry especially at the bases Heart - S1, S2 heard, no murmurs, rubs, 1+ bilateral lower extremity edema Neuro - Alert, awake and oriented, non focal exam. Skin - Warm and dry, leg swelling, redness better      Data Reviewed: I have reviewed patient's CBC as well as BMP results I have also reviewed case managers documentation showing that patient has been accepted at a facility tomorrow.     Vitals:   12/03/22 2320 12/04/22 0319 12/04/22 0815 12/04/22 1621  BP: (!) 178/67 (!) 154/70 (!) 169/75 (!) 169/71  Pulse: 70 65 73 71  Resp: 18  18 18   Temp: 98.1 F (36.7 C)  98.2 F (36.8 C) 98.2 F (36.8 C)  TempSrc:   Oral Oral  SpO2: 95%  94% 94%  Weight:      Height:          Author: Loyce Dys, MD 12/04/2022 4:56 PM  For on call review [email or web address].

## 2022-12-05 DIAGNOSIS — R600 Localized edema: Secondary | ICD-10-CM | POA: Diagnosis not present

## 2022-12-05 DIAGNOSIS — E785 Hyperlipidemia, unspecified: Secondary | ICD-10-CM | POA: Diagnosis not present

## 2022-12-05 DIAGNOSIS — K219 Gastro-esophageal reflux disease without esophagitis: Secondary | ICD-10-CM | POA: Diagnosis not present

## 2022-12-05 DIAGNOSIS — N1831 Chronic kidney disease, stage 3a: Secondary | ICD-10-CM | POA: Diagnosis not present

## 2022-12-05 DIAGNOSIS — Z9181 History of falling: Secondary | ICD-10-CM | POA: Diagnosis not present

## 2022-12-05 DIAGNOSIS — L0390 Cellulitis, unspecified: Secondary | ICD-10-CM | POA: Diagnosis not present

## 2022-12-05 DIAGNOSIS — R2689 Other abnormalities of gait and mobility: Secondary | ICD-10-CM | POA: Diagnosis not present

## 2022-12-05 DIAGNOSIS — L03116 Cellulitis of left lower limb: Secondary | ICD-10-CM | POA: Diagnosis not present

## 2022-12-05 DIAGNOSIS — L03115 Cellulitis of right lower limb: Secondary | ICD-10-CM | POA: Diagnosis not present

## 2022-12-05 DIAGNOSIS — M5481 Occipital neuralgia: Secondary | ICD-10-CM | POA: Diagnosis not present

## 2022-12-05 DIAGNOSIS — Z85828 Personal history of other malignant neoplasm of skin: Secondary | ICD-10-CM | POA: Diagnosis not present

## 2022-12-05 DIAGNOSIS — M159 Polyosteoarthritis, unspecified: Secondary | ICD-10-CM | POA: Diagnosis not present

## 2022-12-05 DIAGNOSIS — R262 Difficulty in walking, not elsewhere classified: Secondary | ICD-10-CM | POA: Diagnosis not present

## 2022-12-05 DIAGNOSIS — G629 Polyneuropathy, unspecified: Secondary | ICD-10-CM | POA: Diagnosis not present

## 2022-12-05 DIAGNOSIS — Z743 Need for continuous supervision: Secondary | ICD-10-CM | POA: Diagnosis not present

## 2022-12-05 DIAGNOSIS — Z8673 Personal history of transient ischemic attack (TIA), and cerebral infarction without residual deficits: Secondary | ICD-10-CM | POA: Diagnosis not present

## 2022-12-05 DIAGNOSIS — E039 Hypothyroidism, unspecified: Secondary | ICD-10-CM | POA: Diagnosis not present

## 2022-12-05 DIAGNOSIS — E782 Mixed hyperlipidemia: Secondary | ICD-10-CM | POA: Diagnosis not present

## 2022-12-05 DIAGNOSIS — K449 Diaphragmatic hernia without obstruction or gangrene: Secondary | ICD-10-CM | POA: Diagnosis not present

## 2022-12-05 DIAGNOSIS — R5381 Other malaise: Secondary | ICD-10-CM | POA: Diagnosis not present

## 2022-12-05 DIAGNOSIS — D649 Anemia, unspecified: Secondary | ICD-10-CM | POA: Diagnosis not present

## 2022-12-05 DIAGNOSIS — Z79899 Other long term (current) drug therapy: Secondary | ICD-10-CM | POA: Diagnosis not present

## 2022-12-05 DIAGNOSIS — Z7401 Bed confinement status: Secondary | ICD-10-CM | POA: Diagnosis not present

## 2022-12-05 DIAGNOSIS — N179 Acute kidney failure, unspecified: Secondary | ICD-10-CM | POA: Diagnosis not present

## 2022-12-05 DIAGNOSIS — M792 Neuralgia and neuritis, unspecified: Secondary | ICD-10-CM | POA: Diagnosis not present

## 2022-12-05 DIAGNOSIS — F32A Depression, unspecified: Secondary | ICD-10-CM | POA: Diagnosis not present

## 2022-12-05 DIAGNOSIS — G4700 Insomnia, unspecified: Secondary | ICD-10-CM | POA: Diagnosis not present

## 2022-12-05 DIAGNOSIS — H547 Unspecified visual loss: Secondary | ICD-10-CM | POA: Diagnosis not present

## 2022-12-05 DIAGNOSIS — I10 Essential (primary) hypertension: Secondary | ICD-10-CM | POA: Diagnosis not present

## 2022-12-05 DIAGNOSIS — M6281 Muscle weakness (generalized): Secondary | ICD-10-CM | POA: Diagnosis not present

## 2022-12-05 DIAGNOSIS — E875 Hyperkalemia: Secondary | ICD-10-CM | POA: Diagnosis not present

## 2022-12-05 MED ORDER — ALPRAZOLAM 1 MG PO TABS
ORAL_TABLET | ORAL | 0 refills | Status: DC
Start: 2022-12-05 — End: 2023-02-03

## 2022-12-05 MED ORDER — METOPROLOL TARTRATE 25 MG PO TABS: 25.0000 mg | ORAL_TABLET | Freq: Two times a day (BID) | ORAL | Status: DC

## 2022-12-05 MED ORDER — TRAZODONE HCL 50 MG PO TABS: 25.0000 mg | ORAL_TABLET | Freq: Every evening | ORAL | Status: DC | PRN

## 2022-12-05 MED ORDER — AMLODIPINE BESYLATE 5 MG PO TABS: 5.0000 mg | ORAL_TABLET | Freq: Every day | ORAL | Status: DC

## 2022-12-05 MED ORDER — PREGABALIN 75 MG PO CAPS: 75.0000 mg | ORAL_CAPSULE | Freq: Three times a day (TID) | ORAL | 0 refills | Status: DC

## 2022-12-05 NOTE — Plan of Care (Signed)

## 2022-12-05 NOTE — Progress Notes (Signed)
Physical Therapy Treatment Patient Details Name: Tracie Fisher MRN: [id number] DOB: Mar 21, 1936 Today's Date: 12/05/2022   History of Present Illness Pt is an 87 y/o female admitted secondary to bilateral LE edema, found to have cellulitis. PMH including but not limited to GERD, depression, hypertension, and hypothyroidism.    PT Comments  Patient on toilet upon arrival with mobility specialist present, handed off to physical therapy. Patient continue to require SBA with transfers and cues for hand placement intermittent. Patient able to complete toilet hygiene with SBA and ambulate 85 ft this date. Patient continue to be self limiting in gait distances, despite max PT encouragement. Returned to room seated in recliner and engaged in LE strengthening, frequent cues to attention to task. Nursing present in room at end of session, handed off. Patient continues to demo progress but slowly. Will continue to benefit from acute skilled PT services. Will continue to follow acutely.     Assistance Recommended at Discharge Frequent or constant Supervision/Assistance  If plan is discharge home, recommend the following:  Can travel by private vehicle    A little help with walking and/or transfers;A little help with bathing/dressing/bathroom;Assistance with cooking/housework;Assist for transportation;Help with stairs or ramp for entrance;Direct supervision/assist for medications management   Yes  Equipment Recommendations  Rolling walker (2 wheels)    Recommendations for Other Services       Precautions / Restrictions Precautions Precautions: Fall Restrictions Weight Bearing Restrictions: No     Mobility  Bed Mobility               General bed mobility comments: Not observed; Mobility Specialist handed out patient upon arrival. Recieved with patient on toilet.    Transfers Overall transfer level: Needs assistance Equipment used: Rolling walker (2 wheels) Transfers: Sit to/from  Stand Sit to Stand: Min guard           General transfer comment: patient able to stand from toilet with RW and cues for hand placement. Patient able to complete toilet hygiene with CGA for balance.    Ambulation/Gait Ambulation/Gait assistance: Min guard Gait Distance (Feet): 85 Feet Assistive device: Rolling walker (2 wheels) Gait Pattern/deviations: Step-through pattern, Decreased step length - right, Decreased step length - left, Decreased stride length Gait velocity: decreased     General Gait Details: Pt with slow, guarded gait with RW, CGA throughout. Vitals stable. PT encouraging additional distance with ambulation, patient refusing due to being cold. Ambulated into room and transferred to recliner.   Stairs             Wheelchair Mobility     Tilt Bed    Modified Rankin (Stroke Patients Only)       Balance Overall balance assessment: Needs assistance Sitting-balance support: Feet supported Sitting balance-Leahy Scale: Good     Standing balance support: Bilateral upper extremity supported, During functional activity, Reliant on assistive device for balance Standing balance-Leahy Scale: Good Standing balance comment: no LOB noted with use of RW, mild unsteadiness noted without UE support.                            Cognition Arousal/Alertness: Awake/alert Behavior During Therapy: Anxious Overall Cognitive Status: Impaired/Different from baseline                                 General Comments: Flat affect. Anxious with aspects of mobility. Patient often grunting throughout  session. Denies pain.        Exercises General Exercises - Lower Extremity Long Arc Quad: AROM, Strengthening, Both, 10 reps, Seated Hip Flexion/Marching: AROM, Strengthening, Both, 10 reps, Seated    General Comments        Pertinent Vitals/Pain Pain Assessment Pain Assessment: No/denies pain ("im not in pain, just cold")    Home Living                           Prior Function            PT Goals (current goals can now be found in the care plan section) Acute Rehab PT Goals Patient Stated Goal: to feel better PT Goal Formulation: With patient/family Time For Goal Achievement: 12/10/22 Potential to Achieve Goals: Good Progress towards PT goals: Progressing toward goals    Frequency    Min 1X/week      PT Plan Current plan remains appropriate    Co-evaluation              AM-PAC PT "6 Clicks" Mobility   Outcome Measure  Help needed turning from your back to your side while in a flat bed without using bedrails?: A Little Help needed moving from lying on your back to sitting on the side of a flat bed without using bedrails?: A Little Help needed moving to and from a bed to a chair (including a wheelchair)?: A Little Help needed standing up from a chair using your arms (e.g., wheelchair or bedside chair)?: A Little Help needed to walk in hospital room?: A Little Help needed climbing 3-5 steps with a railing? : A Lot 6 Click Score: 17    End of Session Equipment Utilized During Treatment: Gait belt Activity Tolerance: Patient tolerated treatment well Patient left: in chair;with nursing/sitter in room (nuresing present in room) Nurse Communication: Mobility status PT Visit Diagnosis: Muscle weakness (generalized) (M62.81);Other abnormalities of gait and mobility (R26.89);Difficulty in walking, not elsewhere classified (R26.2)     Time: 0981-1914 PT Time Calculation (min) (ACUTE ONLY): 15 min  Charges:    $Gait Training: 8-22 mins PT General Charges $$ ACUTE PT VISIT: 1 Visit                     Creed Copper Fairly, PT, DPT 12/05/22 9:29 AM

## 2022-12-05 NOTE — TOC Transition Note (Signed)
Transition of Care [facility] - CM/SW Discharge Note   Patient Details  Name: Tracie Fisher MRN: [id number] Date of Birth: 1935-08-06  Transition of Care [facility]) CM/SW Contact:  Garret Reddish, RN Phone Number: 12/05/2022, 12:14 PM   Clinical Narrative:   Chart reviewed.  Patient has orders for discharge today.  I  I have spoken with Tanya with [facility].  She informs me that she will have a bed for patient today.  She informs me that patient will go to room 5a and the number to call report is [phone].   I have informed Kenney Houseman that I will send Discharge Summary, Discharge orders, and SNF transfer report to hub once available.   I have made patient's son  [name] aware that patient will discharge to the facility today.     I will arrange EMS transport via [facility].    I have informed staff nurse of the above information.       Final next level of care: Skilled Nursing Facility Barriers to Discharge: No Barriers Identified   Patient Goals and CMS Choice CMS Medicare.gov Compare Post Acute Care list provided to:: Patient Choice offered to / list presented to : Patient  Discharge Placement                Patient chooses bed at: [facility] Patient to be transferred to facility by: [facility] Name of family member notified: Tatanisha Cuthbert Patient and family notified of of transfer: 12/05/22  Discharge Plan and Services Additional resources added to the After Visit Summary for     Discharge Planning Services: CM Consult Post Acute Care Choice: Skilled Nursing Facility                               Social Determinants of Health (SDOH) Interventions SDOH Screenings   Food Insecurity: No Food Insecurity (11/25/2022)  Housing: Low Risk  (11/25/2022)  Transportation Needs: No Transportation Needs (11/25/2022)  Utilities: Not At Risk (11/25/2022)  Alcohol Screen: Low Risk  (09/13/2022)  Depression (PHQ2-9): Medium Risk (11/10/2022)   Financial Resource Strain: Low Risk  (09/13/2022)  Physical Activity: Insufficiently Active (09/13/2022)  Social Connections: Socially Isolated (09/13/2022)  Stress: No Stress Concern Present (09/13/2022)  Tobacco Use: Medium Risk (11/25/2022)     Readmission Risk Interventions     No data to display

## 2022-12-05 NOTE — Discharge Summary (Signed)
Physician Discharge Summary   Patient: Tracie Fisher MRN: [id number] DOB: 07-17-1935  Admit date:     11/24/2022  Discharge date: 12/05/22  Discharge Physician: Loyce Dys   PCP: Marjie Skiff, NP     Discharge Diagnoses: Acute renal failure in the setting of torsemide use * Bilateral lower leg cellulitis Shortness of breath and leg swelling Uncontrolled hypertension AKI on CKD stage 3a-improved Hypothyroidism Dyslipidemia Peripheral neuropathy  Hospital Course: Tracie Fisher is a 87 y.o. Caucasian female with medical history significant for GERD, depression, hypertension, CKD stage 3, and hypothyroidism, who presented to the emergency room with acute onset of worsening bilateral lower extremity swelling and erythema. Patient was admitted for bilateral leg cellulitis, started antibiotics.  She completed antibiotic course with improvement in cellulitis.  Was also found to have developed AKI in the setting of torsemide use which is currently held.  She has been weaned off oxygen and doing much better however needs rehabilitation before final discharge home and therefore being discharged today to rehab.    Consultants: None Procedures performed: None Disposition: Skilled nursing facility Diet recommendation:  Discharge Diet Orders (From admission, onward)     Start     Ordered   12/05/22 0000  Diet - low sodium heart healthy        12/05/22 1055           Cardiac diet DISCHARGE MEDICATION: Allergies as of 12/05/2022       Reactions   Aspirin Itching   Penicillins Other (See Comments)   Unknown reaction Has patient had a PCN reaction causing immediate rash, facial/tongue/throat swelling, SOB or lightheadedness with hypotension: Unknown Has patient had a PCN reaction causing severe rash involving mucus membranes or skin necrosis: Unknown Has patient had a PCN reaction that required hospitalization: Unknown Has patient had a PCN reaction occurring within the last  10 years: No If all of the above answers are "NO", then may proceed with Cephalosporin use. Patient does not remember the reaction to penicillin.   Penicillin G    Unknown reaction Has patient had a PCN reaction causing immediate rash, facial/tongue/throat swelling, SOB or lightheadedness with hypotension: Unknown Has patient had a PCN reaction causing severe rash involving mucus membranes or skin necrosis: Unknown Has patient had a PCN reaction that required hospitalization: Unknown Has patient had a PCN reaction occurring within the last 10 years: No If all of the above answers are "NO", then may proceed with Cephalosporin use.        Medication List     STOP taking these medications    ciprofloxacin 250 MG tablet Commonly known as: CIPRO   torsemide 20 MG tablet Commonly known as: DEMADEX       TAKE these medications    ALPRAZolam 1 MG tablet Commonly known as: XANAX Take 0.5 mg (1/2 tablet) by mouth in the morning and 1 mg (1 tablet) by mouth at bedtime.   amLODipine 5 MG tablet Commonly known as: NORVASC Take 1 tablet (5 mg total) by mouth daily. Start taking on: December 06, 2022   clopidogrel 75 MG tablet Commonly known as: PLAVIX Take by mouth.   ferrous gluconate 240 (27 FE) MG tablet Commonly known as: FERGON Take 240 mg by mouth 2 (two) times daily.   levothyroxine 50 MCG tablet Commonly known as: SYNTHROID Take 1 tablet (50 mcg total) by mouth daily before breakfast.   metoprolol tartrate 25 MG tablet Commonly known as: LOPRESSOR Take 1 tablet (25 mg  total) by mouth 2 (two) times daily.   mupirocin ointment 2 % Commonly known as: BACTROBAN Apply 1 Application topically 2 (two) times daily.   pravastatin 20 MG tablet Commonly known as: PRAVACHOL Take by mouth.   pregabalin 75 MG capsule Commonly known as: LYRICA Take 1 capsule (75 mg total) by mouth 3 (three) times daily.   PROBIOTIC DAILY PO Take 1 capsule by mouth daily as needed.    traZODone 50 MG tablet Commonly known as: DESYREL Take 0.5 tablets (25 mg total) by mouth at bedtime as needed for sleep.   V-R VITAMIN B-12 500 MCG tablet Generic drug: cyanocobalamin Take 500 mcg by mouth daily.        Contact information for after-discharge care     Destination     [facility] Preferred SNF .   Service: Skilled Nursing Contact information: [address] [place] [address] [phone]                    Discharge Exam: Ceasar Mons Weights   11/24/22 2036  Weight: 70 kg   Physical Exam: General laying in bed in no acute distress HEENT - PERRLA, EOMI, atraumatic head, non tender sinuses. Lung -decreased air entry especially at the bases Heart - S1, S2 heard, no murmurs, rubs, edema resolved Neuro - Alert, awake and oriented, non focal exam. Skin - Warm and dry, leg swelling, redness better  Condition at discharge: good    Discharge time spent:  33 minutes.  Signed: Loyce Dys, MD [facility] 12/05/2022

## 2022-12-05 NOTE — Progress Notes (Addendum)
Report call to Baxter Hire RN at [facility], All Questions answered. Patient will be leaving via EMS.

## 2022-12-05 NOTE — Care Management Important Message (Signed)
Important Message  Patient Details  Name: Tracie Fisher MRN: [id number] Date of Birth: 20-Apr-1936   Medicare Important Message Given:  Yes     Johnell Comings 12/05/2022, 11:25 AM

## 2022-12-05 NOTE — Progress Notes (Signed)
Mobility Specialist - Progress Note   12/05/22 0901  Mobility  Activity Ambulated with assistance in room;Ambulated with assistance to bathroom;Stood at bedside;Dangled on edge of bed  Level of Assistance Standby assist, set-up cues, supervision of patient - no hands on  Assistive Device Front wheel walker  Distance Ambulated (ft) 10 ft  Activity Response Tolerated well  Mobility Referral Yes  $Mobility charge 1 Mobility  Mobility Specialist Start Time (ACUTE ONLY) 0846  Mobility Specialist Stop Time (ACUTE ONLY) 0900  Mobility Specialist Time Calculation (min) (ACUTE ONLY) 14 min   Pt supine in bed on RA upon arrival. Pt completes bed mobility HHA + extra time. Pt STS from heightened bed height and ambulates to bathroom SBA with no LOB noted. Pt handed off to Physical Therapy still in bathroom.   Terrilyn Saver  Mobility Specialist  12/05/22 9:03 AM

## 2022-12-06 DIAGNOSIS — Z8673 Personal history of transient ischemic attack (TIA), and cerebral infarction without residual deficits: Secondary | ICD-10-CM | POA: Diagnosis not present

## 2022-12-06 DIAGNOSIS — E039 Hypothyroidism, unspecified: Secondary | ICD-10-CM | POA: Diagnosis not present

## 2022-12-06 DIAGNOSIS — L03116 Cellulitis of left lower limb: Secondary | ICD-10-CM | POA: Diagnosis not present

## 2022-12-06 DIAGNOSIS — I10 Essential (primary) hypertension: Secondary | ICD-10-CM | POA: Diagnosis not present

## 2022-12-07 DIAGNOSIS — D649 Anemia, unspecified: Secondary | ICD-10-CM | POA: Diagnosis not present

## 2022-12-08 DIAGNOSIS — I10 Essential (primary) hypertension: Secondary | ICD-10-CM | POA: Diagnosis not present

## 2022-12-08 DIAGNOSIS — G629 Polyneuropathy, unspecified: Secondary | ICD-10-CM | POA: Diagnosis not present

## 2022-12-08 DIAGNOSIS — E785 Hyperlipidemia, unspecified: Secondary | ICD-10-CM | POA: Diagnosis not present

## 2022-12-08 DIAGNOSIS — Z79899 Other long term (current) drug therapy: Secondary | ICD-10-CM | POA: Diagnosis not present

## 2022-12-08 DIAGNOSIS — M792 Neuralgia and neuritis, unspecified: Secondary | ICD-10-CM | POA: Diagnosis not present

## 2022-12-08 DIAGNOSIS — E039 Hypothyroidism, unspecified: Secondary | ICD-10-CM | POA: Diagnosis not present

## 2022-12-13 ENCOUNTER — Telehealth: Payer: Self-pay

## 2022-12-13 DIAGNOSIS — G4700 Insomnia, unspecified: Secondary | ICD-10-CM | POA: Diagnosis not present

## 2022-12-13 NOTE — Progress Notes (Signed)
  Care Coordination Note  12/13/2022 Name: Tracie Fisher MRN: [id number] DOB: December 10, 1935  Tracie Fisher is a 87 y.o. year old female who is a primary care patient of Marjie Skiff, NP and is actively engaged with the Chronic Care Management team. I reached out to Archie Endo by phone today to assist with re-scheduling a follow up visit with the RN Case Manager  Follow up plan: Unsuccessful telephone outreach attempt made. A HIPAA compliant phone message was left for the patient providing contact information and requesting a return call.  The care management team will reach out to the patient again over the next 7 days.  If patient returns call to provider office, please advise to call CCM Care Guide Penne Lash  at [phone]  Penne Lash, RMA Care Guide [facility]  [place], [place] [address] Direct Dial: [phone] [email or web address]

## 2022-12-14 DIAGNOSIS — G629 Polyneuropathy, unspecified: Secondary | ICD-10-CM | POA: Diagnosis not present

## 2022-12-16 DIAGNOSIS — D649 Anemia, unspecified: Secondary | ICD-10-CM | POA: Diagnosis not present

## 2022-12-19 DIAGNOSIS — M159 Polyosteoarthritis, unspecified: Secondary | ICD-10-CM | POA: Diagnosis not present

## 2022-12-19 DIAGNOSIS — L03115 Cellulitis of right lower limb: Secondary | ICD-10-CM | POA: Diagnosis not present

## 2022-12-19 DIAGNOSIS — I10 Essential (primary) hypertension: Secondary | ICD-10-CM | POA: Diagnosis not present

## 2022-12-19 NOTE — Progress Notes (Signed)
  Care Coordination Note  12/19/2022 Name: Tracie Fisher MRN: [id number] DOB: [birth year]  Tracie Fisher is a 87 y.o. year old female who is a primary care patient of Marjie Skiff, NP and is actively engaged with the Chronic Care Management team. I reached out to Archie Endo by phone today to assist with re-scheduling a follow up visit with the RN Case Manager  Follow up plan: Unsuccessful telephone outreach attempt made. A HIPAA compliant phone message was left for the patient providing contact information and requesting a return call.  The care management team will reach out to the patient again over the next 7 days.  If patient returns call to provider office, please advise to call CCM Care Guide Penne Lash  at [phone]  Penne Lash, RMA Care Guide [facility]  [place], [place] [address] Direct Dial: [phone] [email or web address]

## 2022-12-20 DIAGNOSIS — E039 Hypothyroidism, unspecified: Secondary | ICD-10-CM | POA: Diagnosis not present

## 2022-12-21 DIAGNOSIS — I10 Essential (primary) hypertension: Secondary | ICD-10-CM | POA: Diagnosis not present

## 2022-12-21 DIAGNOSIS — M6281 Muscle weakness (generalized): Secondary | ICD-10-CM | POA: Diagnosis not present

## 2022-12-21 DIAGNOSIS — Z9181 History of falling: Secondary | ICD-10-CM | POA: Diagnosis not present

## 2022-12-21 DIAGNOSIS — R2689 Other abnormalities of gait and mobility: Secondary | ICD-10-CM | POA: Diagnosis not present

## 2022-12-21 DIAGNOSIS — E785 Hyperlipidemia, unspecified: Secondary | ICD-10-CM | POA: Diagnosis not present

## 2022-12-21 DIAGNOSIS — R262 Difficulty in walking, not elsewhere classified: Secondary | ICD-10-CM | POA: Diagnosis not present

## 2022-12-22 DIAGNOSIS — L03115 Cellulitis of right lower limb: Secondary | ICD-10-CM | POA: Diagnosis not present

## 2022-12-22 DIAGNOSIS — N1831 Chronic kidney disease, stage 3a: Secondary | ICD-10-CM | POA: Diagnosis not present

## 2022-12-22 DIAGNOSIS — L03116 Cellulitis of left lower limb: Secondary | ICD-10-CM | POA: Diagnosis not present

## 2022-12-22 DIAGNOSIS — F32A Depression, unspecified: Secondary | ICD-10-CM | POA: Diagnosis not present

## 2022-12-27 ENCOUNTER — Telehealth: Payer: Self-pay | Admitting: Nurse Practitioner

## 2022-12-27 ENCOUNTER — Other Ambulatory Visit: Payer: Self-pay | Admitting: Nurse Practitioner

## 2022-12-27 DIAGNOSIS — Z556 Problems related to health literacy: Secondary | ICD-10-CM | POA: Diagnosis not present

## 2022-12-27 DIAGNOSIS — I129 Hypertensive chronic kidney disease with stage 1 through stage 4 chronic kidney disease, or unspecified chronic kidney disease: Secondary | ICD-10-CM | POA: Diagnosis not present

## 2022-12-27 DIAGNOSIS — I499 Cardiac arrhythmia, unspecified: Secondary | ICD-10-CM | POA: Diagnosis not present

## 2022-12-27 DIAGNOSIS — I69318 Other symptoms and signs involving cognitive functions following cerebral infarction: Secondary | ICD-10-CM | POA: Diagnosis not present

## 2022-12-27 DIAGNOSIS — Z85828 Personal history of other malignant neoplasm of skin: Secondary | ICD-10-CM | POA: Diagnosis not present

## 2022-12-27 DIAGNOSIS — E785 Hyperlipidemia, unspecified: Secondary | ICD-10-CM | POA: Diagnosis not present

## 2022-12-27 DIAGNOSIS — K449 Diaphragmatic hernia without obstruction or gangrene: Secondary | ICD-10-CM | POA: Diagnosis not present

## 2022-12-27 DIAGNOSIS — N1831 Chronic kidney disease, stage 3a: Secondary | ICD-10-CM | POA: Diagnosis not present

## 2022-12-27 DIAGNOSIS — Z7902 Long term (current) use of antithrombotics/antiplatelets: Secondary | ICD-10-CM | POA: Diagnosis not present

## 2022-12-27 DIAGNOSIS — E039 Hypothyroidism, unspecified: Secondary | ICD-10-CM | POA: Diagnosis not present

## 2022-12-27 DIAGNOSIS — L03116 Cellulitis of left lower limb: Secondary | ICD-10-CM | POA: Diagnosis not present

## 2022-12-27 DIAGNOSIS — L03115 Cellulitis of right lower limb: Secondary | ICD-10-CM | POA: Diagnosis not present

## 2022-12-27 DIAGNOSIS — H9193 Unspecified hearing loss, bilateral: Secondary | ICD-10-CM | POA: Diagnosis not present

## 2022-12-27 NOTE — Telephone Encounter (Signed)
Copied from CRM [id number]. Topic: General - Other >> Dec 26, 2022  4:38 PM Santiya F wrote: Reason for CRM: Rhae Hammock, a Clinical Manager with [facility] is calling in because pt is going to be starting OT and PT with them and she wanted to inform PCP that pt will be starting tomorrow, and because it outside of their 48 hour window she had to report it. Rhae Hammock says if there are any questions she can be reached at [phone]

## 2022-12-27 NOTE — Telephone Encounter (Signed)
Noted  

## 2022-12-27 NOTE — Telephone Encounter (Signed)
Copied from CRM [id number]. Topic: Quick Communication - Home Health Verbal Orders >> Dec 27, 2022  3:34 PM Macon Large wrote: Caller/Agency: Angie with [facility]  Callback Number: [phone] Requesting OT/PT/Skilled Nursing/Social Work/Speech Therapy: PT Frequency:  2 x 4 and 1 x 5 - Speech Therapy evaluation

## 2022-12-27 NOTE — Telephone Encounter (Signed)
FYI to provider

## 2022-12-27 NOTE — Telephone Encounter (Signed)
Called and gave verbal orders per Jolene.  

## 2022-12-29 NOTE — Telephone Encounter (Signed)
Requested medication (s) are due for refill today: -  Requested medication (s) are on the active medication list:historical med  Last refill:  03/05/19  Future visit scheduled: historical provider  Notes to clinic:  historical provider   Requested Prescriptions  Pending Prescriptions Disp Refills   pravastatin (PRAVACHOL) 20 MG tablet [Pharmacy Med Name: PRAVASTATIN SODIUM 20 MG TAB] 90 tablet 0    Sig: TAKE 1 TABLET BY MOUTH EVERY EVENING     Cardiovascular:  Antilipid - Statins Failed - 12/27/2022  6:52 PM      Failed - Lipid Panel in normal range within the last 12 months    Cholesterol, Total  Date Value Ref Range Status  06/16/2022 180 100 - 199 mg/dL Final   Cholesterol  Date Value Ref Range Status  08/05/2022 169 0 - 200 mg/dL Final   LDL Chol Calc (NIH)  Date Value Ref Range Status  06/16/2022 93 0 - 99 mg/dL Final   LDL Cholesterol  Date Value Ref Range Status  08/05/2022 90 0 - 99 mg/dL Final    Comment:           Total Cholesterol/HDL:CHD Risk Coronary Heart Disease Risk Table                     Men   Women  1/2 Average Risk   3.4   3.3  Average Risk       5.0   4.4  2 X Average Risk   9.6   7.1  3 X Average Risk  23.4   11.0        Use the calculated Patient Ratio above and the CHD Risk Table to determine the patient's CHD Risk.        ATP III CLASSIFICATION (LDL):  <100     mg/dL   Optimal  595-638  mg/dL   Near or Above                    Optimal  130-159  mg/dL   Borderline  756-433  mg/dL   High  >295     mg/dL   Very High Performed at [facility], [address] [place], [place] [address]    HDL  Date Value Ref Range Status  08/05/2022 53 >40 mg/dL Final  66/10/3014 68 >01 mg/dL Final   Triglycerides  Date Value Ref Range Status  08/05/2022 131 <150 mg/dL Final         Passed - Patient is not pregnant      Passed - Valid encounter within last 12 months    Recent Outpatient Visits           1 month ago Cellulitis  of right lower extremity   [place] [facility] [place], Corrie Dandy T, NP   1 month ago Cellulitis of right lower extremity   [place] [facility] [place], [place] T, NP   2 months ago CKD (chronic kidney disease) stage 4, GFR 15-29 ml/min (HCC)   [place] [facility] [place], [place] T, NP   4 months ago History of stroke   [place] [facility] [place], [place] T, NP   4 months ago Cerebrovascular accident (CVA) due to embolism of precerebral artery (HCC)   [place] [facility] [place], Dorie Rank, NP       Future Appointments             In 4 days [place], [place]  T, NP [place] [facility]   In 1 month [place], Dorie Rank, NP [place], [facility]

## 2022-12-30 NOTE — Progress Notes (Signed)
  Care Coordination Note  12/30/2022 Name: [name] MRN: [id number] DOB: 1935/07/09  Tracie Fisher is a 87 y.o. year old female who is a primary care patient of Marjie Skiff, NP and is actively engaged with the Chronic Care Management team. I reached out to Archie Endo by phone today to assist with re-scheduling a follow up visit with the RN Case Manager  Follow up plan: Unable to make contact on outreach attempts x 3. PCP Marjie Skiff, NP notified via routed documentation in medical record.   Penne Lash, RMA Care Guide [facility]  [place], [place] [address] Direct Dial: [phone] [email or web address]

## 2022-12-30 NOTE — Telephone Encounter (Signed)
Noted  

## 2022-12-31 NOTE — Patient Instructions (Signed)

## 2023-01-02 ENCOUNTER — Encounter: Payer: Self-pay | Admitting: Nurse Practitioner

## 2023-01-02 ENCOUNTER — Ambulatory Visit (INDEPENDENT_AMBULATORY_CARE_PROVIDER_SITE_OTHER): Payer: 59 | Admitting: Nurse Practitioner

## 2023-01-02 VITALS — BP 131/71 | HR 66 | Temp 97.9°F | Wt 153.4 lb

## 2023-01-02 DIAGNOSIS — I272 Pulmonary hypertension, unspecified: Secondary | ICD-10-CM

## 2023-01-02 DIAGNOSIS — I10 Essential (primary) hypertension: Secondary | ICD-10-CM

## 2023-01-02 DIAGNOSIS — F5101 Primary insomnia: Secondary | ICD-10-CM

## 2023-01-02 DIAGNOSIS — E782 Mixed hyperlipidemia: Secondary | ICD-10-CM | POA: Diagnosis not present

## 2023-01-02 DIAGNOSIS — R600 Localized edema: Secondary | ICD-10-CM | POA: Diagnosis not present

## 2023-01-02 DIAGNOSIS — D500 Iron deficiency anemia secondary to blood loss (chronic): Secondary | ICD-10-CM

## 2023-01-02 DIAGNOSIS — N184 Chronic kidney disease, stage 4 (severe): Secondary | ICD-10-CM

## 2023-01-02 MED ORDER — MUPIROCIN 2 % EX OINT: 1.0000 | TOPICAL_OINTMENT | Freq: Two times a day (BID) | CUTANEOUS | 4 refills | Status: DC

## 2023-01-02 MED ORDER — LEVOTHYROXINE SODIUM 50 MCG PO TABS
50.0000 ug | ORAL_TABLET | Freq: Every day | ORAL | 4 refills | Status: DC
Start: 2023-01-02 — End: 2023-11-15

## 2023-01-02 MED ORDER — DOXYCYCLINE HYCLATE 100 MG PO TABS: 100.0000 mg | ORAL_TABLET | Freq: Two times a day (BID) | ORAL | 0 refills | Status: AC

## 2023-01-02 NOTE — Progress Notes (Signed)
BP 131/71   Pulse 66   Temp 97.9 F (36.6 C) (Oral)   Wt 153 lb 6.4 oz (69.6 kg)   SpO2 98%   BMI 29.96 kg/m    Subjective:    Patient ID: Tracie Fisher, female    DOB: [birth year], 87 y.o.   MRN: [id number]  HPI: Tracie Fisher is a 87 y.o. female  Chief Complaint  Patient presents with   Chronic Kidney Disease   Hyperlipidemia   Hypertension   INSOMNIA On Xanax for many years, Dr. Arlana Pouch her previous PCP started years ago. She wishes to remain on it -- takes 1/2 tablet Xanax in morning and 1 tablet at night.  Pt is aware of risks of psychoactive medication use to include increased sedation, respiratory suppression, falls, extrapyramidal movements, dependence and cardiovascular events.  Pt would like to continue treatment as benefit determined to outweigh risk.  Last filled on 12/26/22. Duration: chronic Satisfied with sleep quality: yes Difficulty falling asleep: yes Difficulty staying asleep: no Waking a few hours after sleep onset: no Early morning awakenings: no Daytime hypersomnolence: no Wakes feeling refreshed: no Good sleep hygiene: yes Apnea: no Snoring: no Depressed/anxious mood: no Recent stress: no Restless legs/nocturnal leg cramps: no Chronic pain/arthritis: yes History of sleep study: no Treatments attempted:  Xanax and benadryl    CHRONIC KIDNEY DISEASE Follows with Dr. Thedore Mins, nephrology, on 09/26/22 (initial visit) -- returns on 03/29/23.  He recommended to reduce Spironolactone to 3 times a week if ongoing elevation in K+.  She continues to have edema to both legs and redness with some blistering to areas.  She can not put compression hose on, not enough strength in hands.  She reports having some pain on occasion to lower legs.  Denies any recent injuries to legs. CKD status: stable Medications renally dose: yes Previous renal evaluation: yes Pneumovax:  Not up to Date Influenza Vaccine:  Up to Date     Latest Ref Rng & Units 12/04/2022    3:51  AM 12/03/2022    6:42 AM 12/02/2022    8:15 AM  BMP  Glucose 70 - 99 mg/dL 91  91  96   BUN 8 - 23 mg/dL 19  19  20    Creatinine 0.44 - 1.00 mg/dL 1.60  7.37  1.06   Sodium 135 - 145 mmol/L 131  131  134   Potassium 3.5 - 5.1 mmol/L 3.9  3.9  3.8   Chloride 98 - 111 mmol/L 99  99  99   CO2 22 - 32 mmol/L 23  24  23    Calcium 8.9 - 10.3 mg/dL 8.7  8.6  8.8       HYPERTENSION / HYPERLIPIDEMIA Taking Torsemide, Metoprolol, Plavix, and Pravastatin. Moderate pulmonary hypertension. Dr. Juliann Pares held her Losartan and Spironolactone on 12/26/22.  In hospital on 11/24/22 they appear to have stopped her Torsemide and continued her Amlodipine and Metoprolol.  When she was discharged they sent her to rehab, restarted Torsemide she reports.  She has a Ziploc bag with her and reports that they took her off the medications in it -- Spironolactone, Metoprolol, Lasix, Losartan.  She is taking Torsemide  Last saw Dr. Juliann Pares 10/21/22 (cardiology), last echo March 2024 65-70%, Grade diastolic I dysfunction. There is mildly elevated pulmonary artery systolic pressure.  Mild MR.  At visit with cardiology 10/13/2022 they stopped Spironolactone and Losartan due to hyperkalemia and reduced renal function.  To continue Torsemide.     History of  stroke years ago and then recent stroke on 08/04/22. Last neurology visit 09/27/22. Satisfied with current treatment? yes Duration of hypertension: chronic BP monitoring frequency: daily BP range: 112/73 to 150/83, on average <130/80 BP medication side effects: no Duration of hyperlipidemia: chronic Cholesterol medication side effects: no Cholesterol supplements: none Medication compliance: good compliance Aspirin: no Recent stressors: no Recurrent headaches: no Visual changes: no Palpitations: no Dyspnea: has improved with medication changes Chest pain: no Lower extremity edema: occasional Dizzy/lightheaded: no     09/13/2022   11:20 AM 07/15/2022   11:31 AM  6CIT  Screen  What Year? 0 points 0 points  What month? 0 points 0 points  What time? 0 points 0 points  Count back from 20 4 points 0 points  Months in reverse 4 points 4 points  Repeat phrase 6 points 8 points  Total Score 14 points 12 points    Relevant past medical, surgical, family and social history reviewed and updated as indicated. Interim medical history since our last visit reviewed. Allergies and medications reviewed and updated.  Review of Systems  Constitutional:  Negative for activity change, appetite change, diaphoresis, fatigue and fever.  Respiratory:  Negative for cough, chest tightness and shortness of breath.   Cardiovascular:  Positive for leg swelling. Negative for chest pain and palpitations.  Gastrointestinal: Negative.   Neurological: Negative.   Psychiatric/Behavioral:  Negative for decreased concentration, self-injury, sleep disturbance and suicidal ideas.     Per HPI unless specifically indicated above     Objective:    BP 131/71   Pulse 66   Temp 97.9 F (36.6 C) (Oral)   Wt 153 lb 6.4 oz (69.6 kg)   SpO2 98%   BMI 29.96 kg/m   Wt Readings from Last 3 Encounters:  01/02/23 153 lb 6.4 oz (69.6 kg)  11/24/22 154 lb 6.4 oz (70 kg)  11/10/22 157 lb 3.2 oz (71.3 kg)    Physical Exam Vitals and nursing note reviewed.  Constitutional:      General: She is awake. She is not in acute distress.    Appearance: She is well-developed and well-groomed. She is not ill-appearing or toxic-appearing.  HENT:     Head: Normocephalic.     Right Ear: Hearing and external ear normal.     Left Ear: Hearing and external ear normal.  Eyes:     General: Lids are normal.        Right eye: No discharge.        Left eye: No discharge.     Conjunctiva/sclera: Conjunctivae normal.     Pupils: Pupils are equal, round, and reactive to light.  Neck:     Thyroid: No thyromegaly.     Vascular: No carotid bruit.  Cardiovascular:     Rate and Rhythm: Normal rate and regular  rhythm.     Pulses:          Dorsalis pedis pulses are 1+ on the right side and 1+ on the left side.       Posterior tibial pulses are 1+ on the right side and 1+ on the left side.     Heart sounds: Normal heart sounds. No murmur heard.    No gallop.     Comments: Both lower legs with edema and mild redness, R>L.  Small abrasion to bottom, lateral aspect of right leg.  Mild tenderness to touch, but no warmth.  Negative Homans bilaterally. Pulmonary:     Effort: Pulmonary effort is normal. No accessory  muscle usage or respiratory distress.     Breath sounds: Normal breath sounds.  Abdominal:     General: Bowel sounds are normal. There is no distension.     Palpations: Abdomen is soft.     Tenderness: There is no abdominal tenderness.  Musculoskeletal:     Cervical back: Normal range of motion and neck supple.     Right lower leg: 1+ Pitting Edema present.     Left lower leg: 1+ Pitting Edema present.  Lymphadenopathy:     Cervical: No cervical adenopathy.  Skin:    General: Skin is warm and dry.     Comments: Multiple scattered bruises to BUE.  Neurological:     Mental Status: She is alert and oriented to person, place, and time.     Deep Tendon Reflexes: Reflexes are normal and symmetric.     Reflex Scores:      Brachioradialis reflexes are 2+ on the right side and 2+ on the left side.      Patellar reflexes are 2+ on the right side and 2+ on the left side.    Comments: Oriented to location, date, time, and provider today.  Psychiatric:        Attention and Perception: Attention normal.        Mood and Affect: Mood normal.        Speech: Speech normal.        Behavior: Behavior normal. Behavior is cooperative.        Thought Content: Thought content normal.    Results for orders placed or performed during the hospital encounter of 11/24/22  CBC with Differential  Result Value Ref Range   WBC 7.0 4.0 - 10.5 K/uL   RBC 3.52 (L) 3.87 - 5.11 MIL/uL   Hemoglobin 10.9 (L) 12.0 -  15.0 g/dL   HCT 56.2 (L) 13.0 - 86.5 %   MCV 94.6 80.0 - 100.0 fL   MCH 31.0 26.0 - 34.0 pg   MCHC 32.7 30.0 - 36.0 g/dL   RDW 78.4 69.6 - 29.5 %   Platelets 381 150 - 400 K/uL   nRBC 0.0 0.0 - 0.2 %   Neutrophils Relative % 53 %   Neutro Abs 3.7 1.7 - 7.7 K/uL   Lymphocytes Relative 28 %   Lymphs Abs 2.0 0.7 - 4.0 K/uL   Monocytes Relative 13 %   Monocytes Absolute 0.9 0.1 - 1.0 K/uL   Eosinophils Relative 5 %   Eosinophils Absolute 0.4 0.0 - 0.5 K/uL   Basophils Relative 1 %   Basophils Absolute 0.1 0.0 - 0.1 K/uL   Immature Granulocytes 0 %   Abs Immature Granulocytes 0.03 0.00 - 0.07 K/uL  Comprehensive metabolic panel  Result Value Ref Range   Sodium 138 135 - 145 mmol/L   Potassium 3.8 3.5 - 5.1 mmol/L   Chloride 103 98 - 111 mmol/L   CO2 26 22 - 32 mmol/L   Glucose, Bld 119 (H) 70 - 99 mg/dL   BUN 27 (H) 8 - 23 mg/dL   Creatinine, Ser 2.84 (H) 0.44 - 1.00 mg/dL   Calcium 8.8 (L) 8.9 - 10.3 mg/dL   Total Protein 6.3 (L) 6.5 - 8.1 g/dL   Albumin 3.4 (L) 3.5 - 5.0 g/dL   AST 25 15 - 41 U/L   ALT 16 0 - 44 U/L   Alkaline Phosphatase 65 38 - 126 U/L   Total Bilirubin 0.5 0.3 - 1.2 mg/dL   GFR, Estimated 42 (L) >  60 mL/min   Anion gap 9 5 - 15  Brain natriuretic peptide  Result Value Ref Range   B Natriuretic Peptide 153.2 (H) 0.0 - 100.0 pg/mL  Basic metabolic panel  Result Value Ref Range   Sodium 139 135 - 145 mmol/L   Potassium 3.7 3.5 - 5.1 mmol/L   Chloride 102 98 - 111 mmol/L   CO2 28 22 - 32 mmol/L   Glucose, Bld 82 70 - 99 mg/dL   BUN 24 (H) 8 - 23 mg/dL   Creatinine, Ser 1.61 (H) 0.44 - 1.00 mg/dL   Calcium 8.5 (L) 8.9 - 10.3 mg/dL   GFR, Estimated 41 (L) >60 mL/min   Anion gap 9 5 - 15  CBC  Result Value Ref Range   WBC 5.0 4.0 - 10.5 K/uL   RBC 3.63 (L) 3.87 - 5.11 MIL/uL   Hemoglobin 11.2 (L) 12.0 - 15.0 g/dL   HCT 09.6 (L) 04.5 - 40.9 %   MCV 98.6 80.0 - 100.0 fL   MCH 30.9 26.0 - 34.0 pg   MCHC 31.3 30.0 - 36.0 g/dL   RDW 81.1 91.4 - 78.2 %    Platelets 348 150 - 400 K/uL   nRBC 0.0 0.0 - 0.2 %  Creatinine, serum  Result Value Ref Range   Creatinine, Ser 1.16 (H) 0.44 - 1.00 mg/dL   GFR, Estimated 46 (L) >60 mL/min  Creatinine, serum  Result Value Ref Range   Creatinine, Ser 1.16 (H) 0.44 - 1.00 mg/dL   GFR, Estimated 46 (L) >60 mL/min  Creatinine, serum  Result Value Ref Range   Creatinine, Ser 1.25 (H) 0.44 - 1.00 mg/dL   GFR, Estimated 42 (L) >60 mL/min  CBC  Result Value Ref Range   WBC 8.4 4.0 - 10.5 K/uL   RBC 3.43 (L) 3.87 - 5.11 MIL/uL   Hemoglobin 10.5 (L) 12.0 - 15.0 g/dL   HCT 95.6 (L) 21.3 - 08.6 %   MCV 91.8 80.0 - 100.0 fL   MCH 30.6 26.0 - 34.0 pg   MCHC 33.3 30.0 - 36.0 g/dL   RDW 57.8 46.9 - 62.9 %   Platelets 333 150 - 400 K/uL   nRBC 0.0 0.0 - 0.2 %  Basic metabolic panel  Result Value Ref Range   Sodium 136 135 - 145 mmol/L   Potassium 3.6 3.5 - 5.1 mmol/L   Chloride 96 (L) 98 - 111 mmol/L   CO2 28 22 - 32 mmol/L   Glucose, Bld 94 70 - 99 mg/dL   BUN 26 (H) 8 - 23 mg/dL   Creatinine, Ser 5.28 (H) 0.44 - 1.00 mg/dL   Calcium 8.9 8.9 - 41.3 mg/dL   GFR, Estimated 29 (L) >60 mL/min   Anion gap 12 5 - 15  CBC with Differential/Platelet  Result Value Ref Range   WBC 7.2 4.0 - 10.5 K/uL   RBC 3.51 (L) 3.87 - 5.11 MIL/uL   Hemoglobin 10.8 (L) 12.0 - 15.0 g/dL   HCT 24.4 (L) 01.0 - 27.2 %   MCV 93.7 80.0 - 100.0 fL   MCH 30.8 26.0 - 34.0 pg   MCHC 32.8 30.0 - 36.0 g/dL   RDW 53.6 64.4 - 03.4 %   Platelets 298 150 - 400 K/uL   nRBC 0.0 0.0 - 0.2 %   Neutrophils Relative % 61 %   Neutro Abs 4.4 1.7 - 7.7 K/uL   Lymphocytes Relative 22 %   Lymphs Abs 1.6 0.7 - 4.0 K/uL  Monocytes Relative 13 %   Monocytes Absolute 0.9 0.1 - 1.0 K/uL   Eosinophils Relative 3 %   Eosinophils Absolute 0.2 0.0 - 0.5 K/uL   Basophils Relative 1 %   Basophils Absolute 0.1 0.0 - 0.1 K/uL   Immature Granulocytes 0 %   Abs Immature Granulocytes 0.03 0.00 - 0.07 K/uL  Basic metabolic panel  Result Value Ref  Range   Sodium 131 (L) 135 - 145 mmol/L   Potassium 3.0 (L) 3.5 - 5.1 mmol/L   Chloride 96 (L) 98 - 111 mmol/L   CO2 26 22 - 32 mmol/L   Glucose, Bld 78 70 - 99 mg/dL   BUN 24 (H) 8 - 23 mg/dL   Creatinine, Ser 2.95 (H) 0.44 - 1.00 mg/dL   Calcium 8.3 (L) 8.9 - 10.3 mg/dL   GFR, Estimated 41 (L) >60 mL/min   Anion gap 9 5 - 15  CBC with Differential/Platelet  Result Value Ref Range   WBC 8.6 4.0 - 10.5 K/uL   RBC 3.75 (L) 3.87 - 5.11 MIL/uL   Hemoglobin 11.8 (L) 12.0 - 15.0 g/dL   HCT 28.4 (L) 13.2 - 44.0 %   MCV 91.2 80.0 - 100.0 fL   MCH 31.5 26.0 - 34.0 pg   MCHC 34.5 30.0 - 36.0 g/dL   RDW 10.2 72.5 - 36.6 %   Platelets 317 150 - 400 K/uL   nRBC 0.0 0.0 - 0.2 %   Neutrophils Relative % 68 %   Neutro Abs 5.9 1.7 - 7.7 K/uL   Lymphocytes Relative 20 %   Lymphs Abs 1.7 0.7 - 4.0 K/uL   Monocytes Relative 10 %   Monocytes Absolute 0.9 0.1 - 1.0 K/uL   Eosinophils Relative 1 %   Eosinophils Absolute 0.1 0.0 - 0.5 K/uL   Basophils Relative 1 %   Basophils Absolute 0.0 0.0 - 0.1 K/uL   Immature Granulocytes 0 %   Abs Immature Granulocytes 0.03 0.00 - 0.07 K/uL  Basic metabolic panel  Result Value Ref Range   Sodium 134 (L) 135 - 145 mmol/L   Potassium 3.8 3.5 - 5.1 mmol/L   Chloride 99 98 - 111 mmol/L   CO2 23 22 - 32 mmol/L   Glucose, Bld 96 70 - 99 mg/dL   BUN 20 8 - 23 mg/dL   Creatinine, Ser 4.40 (H) 0.44 - 1.00 mg/dL   Calcium 8.8 (L) 8.9 - 10.3 mg/dL   GFR, Estimated 46 (L) >60 mL/min   Anion gap 12 5 - 15  CBC with Differential/Platelet  Result Value Ref Range   WBC 6.4 4.0 - 10.5 K/uL   RBC 3.65 (L) 3.87 - 5.11 MIL/uL   Hemoglobin 11.2 (L) 12.0 - 15.0 g/dL   HCT 34.7 (L) 42.5 - 95.6 %   MCV 94.2 80.0 - 100.0 fL   MCH 30.7 26.0 - 34.0 pg   MCHC 32.6 30.0 - 36.0 g/dL   RDW 38.7 56.4 - 33.2 %   Platelets 298 150 - 400 K/uL   nRBC 0.0 0.0 - 0.2 %   Neutrophils Relative % 63 %   Neutro Abs 4.0 1.7 - 7.7 K/uL   Lymphocytes Relative 21 %   Lymphs Abs 1.4  0.7 - 4.0 K/uL   Monocytes Relative 12 %   Monocytes Absolute 0.8 0.1 - 1.0 K/uL   Eosinophils Relative 3 %   Eosinophils Absolute 0.2 0.0 - 0.5 K/uL   Basophils Relative 1 %   Basophils Absolute 0.0  0.0 - 0.1 K/uL   Immature Granulocytes 0 %   Abs Immature Granulocytes 0.02 0.00 - 0.07 K/uL  Basic metabolic panel  Result Value Ref Range   Sodium 131 (L) 135 - 145 mmol/L   Potassium 3.9 3.5 - 5.1 mmol/L   Chloride 99 98 - 111 mmol/L   CO2 24 22 - 32 mmol/L   Glucose, Bld 91 70 - 99 mg/dL   BUN 19 8 - 23 mg/dL   Creatinine, Ser 7.84 (H) 0.44 - 1.00 mg/dL   Calcium 8.6 (L) 8.9 - 10.3 mg/dL   GFR, Estimated 47 (L) >60 mL/min   Anion gap 8 5 - 15  Basic metabolic panel  Result Value Ref Range   Sodium 131 (L) 135 - 145 mmol/L   Potassium 3.9 3.5 - 5.1 mmol/L   Chloride 99 98 - 111 mmol/L   CO2 23 22 - 32 mmol/L   Glucose, Bld 91 70 - 99 mg/dL   BUN 19 8 - 23 mg/dL   Creatinine, Ser 6.96 (H) 0.44 - 1.00 mg/dL   Calcium 8.7 (L) 8.9 - 10.3 mg/dL   GFR, Estimated 49 (L) >60 mL/min   Anion gap 9 5 - 15      Assessment & Plan:   Problem List Items Addressed This Visit       Cardiovascular and Mediastinum   Essential hypertension    Chronic, ongoing.  BP at goal for age today.  Recommend she monitor BP at least a few mornings a week at home and document.  DASH diet at home.  Continue Torsemide only.  Collaboration with cardiology - recent note reviewed and medication changes.  Labs today: CBC and CMP.  Urine ALB 25 June 2022.         Relevant Medications   torsemide (DEMADEX) 20 MG tablet   levothyroxine (SYNTHROID) 50 MCG tablet   Other Relevant Orders   Comprehensive metabolic panel   Moderate pulmonary hypertension (HCC) - Primary    Chronic, ongoing.  Followed by cardiology, continue current medication regimen as prescribed by them and collaboration.  Recent notes reviewed. They have adjusted medications.      Relevant Medications   torsemide (DEMADEX) 20 MG  tablet   Other Relevant Orders   Comprehensive metabolic panel     Genitourinary   CKD (chronic kidney disease) stage 4, GFR 15-29 ml/min (HCC)    Chronic, ongoing.  Is being followed by nephrology, had recent visit.  Currently CKD 4.  Educated her on diet and fluid intake + current medications. Urine ALB 25 June 2022.  Recommend continue all current medications and wear compression hose at home on during day and off at night -- she can not get these on.  Labs today.      Relevant Orders   CBC with Differential/Platelet   Comprehensive metabolic panel     Other   Edema of both legs    Ongoing, suspect PVD and possibly some lymphedema present.  She has difficulty placing compression hose.  Concern for further infections, small abrasion present to right lower leg at present.  Start Doxycycline and Mupirocin due to symptoms and risk.  Referral to vascular for further assessment and recommendations.  May benefit from compression pumps.      Relevant Orders   B Nat Peptide   Ambulatory referral to Vascular Surgery   Insomnia    Chronic, ongoing for years.  Previous PCP, Dr. Arlana Pouch, placed her on Xanax many years ago and she still  takes, reports she cannot sleep without it.  At this time will continue current regimen and adjust if possible.  Aware of need for every 3 month visits + a controlled substance agreement at next visit and UDS up to date (due next 08/04/23).  Return in 3 months.      Mixed hyperlipidemia    Chronic, ongoing.  Continue current medication regimen and adjust as needed.  Labs up to date.      Relevant Medications   torsemide (DEMADEX) 20 MG tablet     Follow up plan: Return in about 10 days (around 01/12/2023) for LEG EDEMA/CELLULITIS.

## 2023-01-02 NOTE — Assessment & Plan Note (Signed)
Chronic, ongoing.  Is being followed by nephrology, had recent visit.  Currently CKD 4.  Educated her on diet and fluid intake + current medications. Urine ALB 25 June 2022.  Recommend continue all current medications and wear compression hose at home on during day and off at night -- she can not get these on.  Labs today.

## 2023-01-02 NOTE — Assessment & Plan Note (Signed)
Chronic, ongoing.  Followed by cardiology, continue current medication regimen as prescribed by them and collaboration.  Recent notes reviewed. They have adjusted medications.

## 2023-01-02 NOTE — Addendum Note (Signed)
Addended by: Aura Dials T on: 01/02/2023 02:14 PM   Modules accepted: Orders

## 2023-01-02 NOTE — Assessment & Plan Note (Signed)
Chronic, ongoing.  Continue current medication regimen and adjust as needed.  Labs up to date. 

## 2023-01-02 NOTE — Assessment & Plan Note (Signed)
Ongoing, suspect PVD and possibly some lymphedema present.  She has difficulty placing compression hose.  Concern for further infections, small abrasion present to right lower leg at present.  Start Doxycycline and Mupirocin due to symptoms and risk.  Referral to vascular for further assessment and recommendations.  May benefit from compression pumps.

## 2023-01-02 NOTE — Assessment & Plan Note (Signed)
Chronic, ongoing for years.  Previous PCP, Dr. Arlana Pouch, placed her on Xanax many years ago and she still takes, reports she cannot sleep without it.  At this time will continue current regimen and adjust if possible.  Aware of need for every 3 month visits + a controlled substance agreement at next visit and UDS up to date (due next 08/04/23).  Return in 3 months.

## 2023-01-02 NOTE — Assessment & Plan Note (Signed)
Chronic, ongoing.  BP at goal for age today.  Recommend she monitor BP at least a few mornings a week at home and document.  DASH diet at home.  Continue Torsemide only.  Collaboration with cardiology - recent note reviewed and medication changes.  Labs today: CBC and CMP.  Urine ALB 25 June 2022.

## 2023-01-03 ENCOUNTER — Other Ambulatory Visit: Payer: 59

## 2023-01-03 ENCOUNTER — Telehealth: Payer: Self-pay | Admitting: Nurse Practitioner

## 2023-01-03 ENCOUNTER — Other Ambulatory Visit: Payer: Self-pay

## 2023-01-03 DIAGNOSIS — E785 Hyperlipidemia, unspecified: Secondary | ICD-10-CM | POA: Diagnosis not present

## 2023-01-03 DIAGNOSIS — L03115 Cellulitis of right lower limb: Secondary | ICD-10-CM | POA: Diagnosis not present

## 2023-01-03 DIAGNOSIS — E039 Hypothyroidism, unspecified: Secondary | ICD-10-CM | POA: Diagnosis not present

## 2023-01-03 DIAGNOSIS — I499 Cardiac arrhythmia, unspecified: Secondary | ICD-10-CM | POA: Diagnosis not present

## 2023-01-03 DIAGNOSIS — L03116 Cellulitis of left lower limb: Secondary | ICD-10-CM | POA: Diagnosis not present

## 2023-01-03 DIAGNOSIS — Z7902 Long term (current) use of antithrombotics/antiplatelets: Secondary | ICD-10-CM | POA: Diagnosis not present

## 2023-01-03 DIAGNOSIS — H9193 Unspecified hearing loss, bilateral: Secondary | ICD-10-CM | POA: Diagnosis not present

## 2023-01-03 DIAGNOSIS — N1831 Chronic kidney disease, stage 3a: Secondary | ICD-10-CM | POA: Diagnosis not present

## 2023-01-03 DIAGNOSIS — Z556 Problems related to health literacy: Secondary | ICD-10-CM | POA: Diagnosis not present

## 2023-01-03 DIAGNOSIS — Z85828 Personal history of other malignant neoplasm of skin: Secondary | ICD-10-CM | POA: Diagnosis not present

## 2023-01-03 DIAGNOSIS — K449 Diaphragmatic hernia without obstruction or gangrene: Secondary | ICD-10-CM | POA: Diagnosis not present

## 2023-01-03 DIAGNOSIS — I129 Hypertensive chronic kidney disease with stage 1 through stage 4 chronic kidney disease, or unspecified chronic kidney disease: Secondary | ICD-10-CM | POA: Diagnosis not present

## 2023-01-03 DIAGNOSIS — I69318 Other symptoms and signs involving cognitive functions following cerebral infarction: Secondary | ICD-10-CM | POA: Diagnosis not present

## 2023-01-03 LAB — COMPREHENSIVE METABOLIC PANEL
ALT: 8 IU/L (ref 0–32)
AST: 15 IU/L (ref 0–40)
Albumin: 3.9 g/dL (ref 3.7–4.7)
Alkaline Phosphatase: 70 IU/L (ref 44–121)
BUN/Creatinine Ratio: 21 (ref 12–28)
BUN: 25 mg/dL (ref 8–27)
Bilirubin Total: 0.3 mg/dL (ref 0.0–1.2)
CO2: 21 mmol/L (ref 20–29)
Calcium: 9.3 mg/dL (ref 8.7–10.3)
Chloride: 103 mmol/L (ref 96–106)
Creatinine, Ser: 1.2 mg/dL — ABNORMAL HIGH (ref 0.57–1.00)
Globulin, Total: 1.8 g/dL (ref 1.5–4.5)
Glucose: 72 mg/dL (ref 70–99)
Potassium: 3.9 mmol/L (ref 3.5–5.2)
Sodium: 140 mmol/L (ref 134–144)
Total Protein: 5.7 g/dL — ABNORMAL LOW (ref 6.0–8.5)
eGFR: 44 mL/min/{1.73_m2} — ABNORMAL LOW (ref >59)

## 2023-01-03 NOTE — Patient Instructions (Signed)
Visit Information  Thank you for taking time to visit with me today. Please don't hesitate to contact me if I can be of assistance to you before our next scheduled telephone appointment.  Following are the goals we discussed today:   Goals Addressed             This Visit's Progress    RNCM Care Management Expected Outcome:  Monitor, Self-Manage and Reduce Symptoms of: Memory changes       Current Barriers:  Chronic Disease Management support and education needs related to memory changes   Planned Interventions: Evaluation of current treatment plan related to dementia or memory changes and patient's adherence to plan as established by provider. The patient says that she is not doing well but trying to do better. Is working with PT, OT, and speech therapy in her home. She says they saw her this am.  Advised patient to call the office for changes in memory, increased anxiety or acute changes in mental status Provided education to patient re: safety in the home, falls preventions, support system, and medication safety Reviewed medications with patient and discussed compliance. The patient keeps her medications in their original bottles and she has them set up where she remembers to take them each morning. She denies any issues with making sure she takes her medications as directed. Review of the ability for pharmacy support if needed. The patient denies any needs at this time related to medication needs. Will continue to monitor.  Reviewed scheduled/upcoming provider appointments including 01-18-2023 with the pcp Discussed plans with patient for ongoing care management follow up and provided patient with direct contact information for care management team Advised patient to discuss changes in her memory, new questions or concerns about her health and well being with provider Screening for signs and symptoms of depression related to chronic disease state  Assessed social determinant of health  barriers The patient denies any falls at this time, she uses a rollator walker, does have to pace her activity and rest at times, uses a wheelchair sometimes when she goes to different places.  She still drives and takes care of herself. She states her family is available if needed. Her son [name] always comes on Sundays and they go out to eat or she fixes something for them to eat. She also has 2 sisters that she talks to on a regular basis. She has several friends at the apartment that she lives at. She feels she is doing well to be 87 years old. Review of the availability of the RNCM to help support her with her health and well being. The patient has the [facility] number to call for needs in between outreaches.   Symptom Management: Take medications as prescribed   Attend all scheduled provider appointments Call provider office for new concerns or questions  call the Suicide and Crisis Lifeline: 988 call the [place] National Suicide Prevention Lifeline: (704)214-9400 or TTY: (772) 568-1253 TTY 470-445-6856) to talk to a trained counselor call 1-800-273-TALK (toll free, 24 hour hotline) if experiencing a Mental Health or Behavioral Health Crisis   Follow Up Plan: Telephone follow up appointment with care management team member scheduled for: 01-31-2023 at 0945 am       RNCM Care Management Expected Outcome:  Monitor, Self-Manage, and Reduce Symptoms of Hypertension       Current Barriers:  Chronic Disease Management support and education needs related to effective management of HTN BP Readings from Last 3 Encounters:  01/02/23 131/71  12/05/22 138/70  11/10/22 120/71     Planned Interventions: Evaluation of current treatment plan related to hypertension self management and patient's adherence to plan as established by provider. Denies any issues with her blood pressures or heart health. The patient states that she got a letter from her insurance about blood thinner. The patient wants Jolene to look  at the letter and advise. Will let Jolene know she is bringing the letter in to view at  her upcoming appointment in September;   Provided education to patient re: stroke prevention, s/s of heart attack and stroke. Has had a stroke in the past. Sees specialist. Her son has been going with her to appointments when needed; Reviewed prescribed diet heart healthy diet Reviewed medications with patient and discussed importance of compliance. The patient states compliance with medications.;  Discussed plans with patient for ongoing care management follow up and provided patient with direct contact information for care management team; Advised patient, providing education and rationale, to monitor blood pressure daily and record, calling PCP for findings outside established parameters;  Reviewed scheduled/upcoming provider appointments including: 01-18-2023 Advised patient to discuss changes in HTN or heart health with provider; Provided education on prescribed diet heart healthy diet;  Discussed complications of poorly controlled blood pressure such as heart disease, stroke, circulatory complications, vision complications, kidney impairment, sexual dysfunction;  Screening for signs and symptoms of depression related to chronic disease state;  Assessed social determinant of health barriers;   Symptom Management: Take medications as prescribed   Attend all scheduled provider appointments Call provider office for new concerns or questions  call the Suicide and Crisis Lifeline: 988 call the [place] National Suicide Prevention Lifeline: 669-180-1457 or TTY: 949-465-9910 TTY 732-420-8005) to talk to a trained counselor call 1-800-273-TALK (toll free, 24 hour hotline) if experiencing a Mental Health or Behavioral Health Crisis  check blood pressure weekly learn about high blood pressure call doctor for signs and symptoms of high blood pressure develop an action plan for high blood pressure keep all doctor  appointments take medications for blood pressure exactly as prescribed report new symptoms to your doctor  Follow Up Plan: Telephone follow up appointment with care management team member scheduled for: 01-31-2023 at 0945 am       RNCM Care Management: Maintain, Monitor and Self-Manage Symptoms of COPD       Current Barriers:  Chronic Disease Management support and education needs related to effective management of COPD  Planned Interventions: Provided patient with basic written and verbal COPD education on self care/management/and exacerbation prevention. The patient states she can tell when her allergies and sinuses are acting up. The patient states that sometimes she has some periods of shortness of breath. She paces her activity and knows her limitations. Only uses one pillow at night when sleeping. She denies any acute distress in her breathing today. Feels she is stable.  Advised patient to track and manage COPD triggers. Review of triggers that may exacerbate her conditions. She paces her activity and rest when she needs to do so. Provided written and verbal instructions on pursed lip breathing and utilized returned demonstration as teach back Provided instruction about proper use of medications used for management of COPD including inhalers Advised patient to self assesses COPD action plan zone and make appointment with provider if in the yellow zone for 48 hours without improvement Provided education about and advised patient to utilize infection prevention strategies to reduce risk of respiratory infection Discussed the importance of adequate rest  and management of fatigue with COPD Screening for signs and symptoms of depression related to chronic disease state  Assessed social determinant of health barriers  Symptom Management: Take medications as prescribed   Attend all scheduled provider appointments Call provider office for new concerns or questions  call the Suicide and Crisis  Lifeline: 988 call the [place] National Suicide Prevention Lifeline: (812)406-2395 or TTY: 269-079-2175 TTY 640-577-7768) to talk to a trained counselor call 1-800-273-TALK (toll free, 24 hour hotline) if experiencing a Mental Health or Behavioral Health Crisis  avoid second hand smoke eliminate smoking in my home identify and remove indoor air pollutants limit outdoor activity during cold weather listen for public air quality announcements every day do breathing exercises every day develop a rescue plan follow rescue plan if symptoms flare-up  Follow Up Plan: Telephone follow up appointment with care management team member scheduled for: 01-31-2023 at 0945 am           Our next appointment is by telephone on 01-31-2023 at 0945 am  Please call the care guide team at [phone] if you need to cancel or reschedule your appointment.   If you are experiencing a Mental Health or Behavioral Health Crisis or need someone to talk to, please call the Suicide and Crisis Lifeline: 988 call the [place] National Suicide Prevention Lifeline: 253-713-5459 or TTY: 608-615-5007 TTY 248-027-4647) to talk to a trained counselor call 1-800-273-TALK (toll free, 24 hour hotline)   The patient verbalized understanding of instructions, educational materials, and care plan provided today and DECLINED offer to receive copy of patient instructions, educational materials, and care plan.   Telephone follow up appointment with care management team member scheduled for: 01-31-2023 at 0945 am  Alto Denver RN, MSN, CCM RN Care Manager  [facility]  Ambulatory Care Management  Direct Number: [phone]

## 2023-01-03 NOTE — Patient Outreach (Signed)
Care Management   Visit Note  01/03/2023 Name: AMEENA FLEMINGS MRN: [id number] DOB: 1935/10/25  Subjective: Tracie Fisher is a 87 y.o. year old female who is a primary care patient of Cannady, Dorie Rank, NP. The Care Management team was consulted for assistance.      Engaged with patient spoke with patient by telephone.    Goals Addressed             This Visit's Progress    RNCM Care Management Expected Outcome:  Monitor, Self-Manage and Reduce Symptoms of: Memory changes       Current Barriers:  Chronic Disease Management support and education needs related to memory changes   Planned Interventions: Evaluation of current treatment plan related to dementia or memory changes and patient's adherence to plan as established by provider. The patient says that she is not doing well but trying to do better. Is working with PT, OT, and speech therapy in her home. She says they saw her this am.  Advised patient to call the office for changes in memory, increased anxiety or acute changes in mental status Provided education to patient re: safety in the home, falls preventions, support system, and medication safety Reviewed medications with patient and discussed compliance. The patient keeps her medications in their original bottles and she has them set up where she remembers to take them each morning. She denies any issues with making sure she takes her medications as directed. Review of the ability for pharmacy support if needed. The patient denies any needs at this time related to medication needs. Will continue to monitor.  Reviewed scheduled/upcoming provider appointments including 01-18-2023 with the pcp Discussed plans with patient for ongoing care management follow up and provided patient with direct contact information for care management team Advised patient to discuss changes in her memory, new questions or concerns about her health and well being with provider Screening for signs and  symptoms of depression related to chronic disease state  Assessed social determinant of health barriers The patient denies any falls at this time, she uses a rollator walker, does have to pace her activity and rest at times, uses a wheelchair sometimes when she goes to different places.  She still drives and takes care of herself. She states her family is available if needed. Her son [name] always comes on Sundays and they go out to eat or she fixes something for them to eat. She also has 2 sisters that she talks to on a regular basis. She has several friends at the apartment that she lives at. She feels she is doing well to be 87 years old. Review of the availability of the RNCM to help support her with her health and well being. The patient has the [facility] number to call for needs in between outreaches.   Symptom Management: Take medications as prescribed   Attend all scheduled provider appointments Call provider office for new concerns or questions  call the Suicide and Crisis Lifeline: 988 call the [place] National Suicide Prevention Lifeline: 440-291-1863 or TTY: 863-576-9818 TTY (475)093-1314) to talk to a trained counselor call 1-800-273-TALK (toll free, 24 hour hotline) if experiencing a Mental Health or Behavioral Health Crisis   Follow Up Plan: Telephone follow up appointment with care management team member scheduled for: 01-31-2023 at 0945 am       RNCM Care Management Expected Outcome:  Monitor, Self-Manage, and Reduce Symptoms of Hypertension       Current Barriers:  Chronic Disease Management  support and education needs related to effective management of HTN BP Readings from Last 3 Encounters:  01/02/23 131/71  12/05/22 138/70  11/10/22 120/71     Planned Interventions: Evaluation of current treatment plan related to hypertension self management and patient's adherence to plan as established by provider. Denies any issues with her blood pressures or heart health. The patient states  that she got a letter from her insurance about blood thinner. The patient wants Jolene to look at the letter and advise. Will let Jolene know she is bringing the letter in to view at  her upcoming appointment in September;   Provided education to patient re: stroke prevention, s/s of heart attack and stroke. Has had a stroke in the past. Sees specialist. Her son has been going with her to appointments when needed; Reviewed prescribed diet heart healthy diet Reviewed medications with patient and discussed importance of compliance. The patient states compliance with medications.;  Discussed plans with patient for ongoing care management follow up and provided patient with direct contact information for care management team; Advised patient, providing education and rationale, to monitor blood pressure daily and record, calling PCP for findings outside established parameters;  Reviewed scheduled/upcoming provider appointments including: 01-18-2023 Advised patient to discuss changes in HTN or heart health with provider; Provided education on prescribed diet heart healthy diet;  Discussed complications of poorly controlled blood pressure such as heart disease, stroke, circulatory complications, vision complications, kidney impairment, sexual dysfunction;  Screening for signs and symptoms of depression related to chronic disease state;  Assessed social determinant of health barriers;   Symptom Management: Take medications as prescribed   Attend all scheduled provider appointments Call provider office for new concerns or questions  call the Suicide and Crisis Lifeline: 988 call the [place] National Suicide Prevention Lifeline: 501-515-1049 or TTY: 218-648-4860 TTY (380)641-8337) to talk to a trained counselor call 1-800-273-TALK (toll free, 24 hour hotline) if experiencing a Mental Health or Behavioral Health Crisis  check blood pressure weekly learn about high blood pressure call doctor for signs and  symptoms of high blood pressure develop an action plan for high blood pressure keep all doctor appointments take medications for blood pressure exactly as prescribed report new symptoms to your doctor  Follow Up Plan: Telephone follow up appointment with care management team member scheduled for: 01-31-2023 at 0945 am       RNCM Care Management: Maintain, Monitor and Self-Manage Symptoms of COPD       Current Barriers:  Chronic Disease Management support and education needs related to effective management of COPD  Planned Interventions: Provided patient with basic written and verbal COPD education on self care/management/and exacerbation prevention. The patient states she can tell when her allergies and sinuses are acting up. The patient states that sometimes she has some periods of shortness of breath. She paces her activity and knows her limitations. Only uses one pillow at night when sleeping. She denies any acute distress in her breathing today. Feels she is stable.  Advised patient to track and manage COPD triggers. Review of triggers that may exacerbate her conditions. She paces her activity and rest when she needs to do so. Provided written and verbal instructions on pursed lip breathing and utilized returned demonstration as teach back Provided instruction about proper use of medications used for management of COPD including inhalers Advised patient to self assesses COPD action plan zone and make appointment with provider if in the yellow zone for 48 hours without improvement Provided education about  and advised patient to utilize infection prevention strategies to reduce risk of respiratory infection Discussed the importance of adequate rest and management of fatigue with COPD Screening for signs and symptoms of depression related to chronic disease state  Assessed social determinant of health barriers  Symptom Management: Take medications as prescribed   Attend all scheduled  provider appointments Call provider office for new concerns or questions  call the Suicide and Crisis Lifeline: 988 call the [place] National Suicide Prevention Lifeline: (414) 580-1275 or TTY: 210-371-9536 TTY 917-820-2314) to talk to a trained counselor call 1-800-273-TALK (toll free, 24 hour hotline) if experiencing a Mental Health or Behavioral Health Crisis  avoid second hand smoke eliminate smoking in my home identify and remove indoor air pollutants limit outdoor activity during cold weather listen for public air quality announcements every day do breathing exercises every day develop a rescue plan follow rescue plan if symptoms flare-up  Follow Up Plan: Telephone follow up appointment with care management team member scheduled for: 01-31-2023 at 0945 am            Consent to Services:  Patient was given information about care management services, agreed to services, and gave verbal consent to participate.   Plan: Telephone follow up appointment with care management team member scheduled for: 01-31-2023 at 0945 am  Alto Denver RN, MSN, CCM RN Care Manager  [facility]  Ambulatory Care Management  Direct Number: [phone]

## 2023-01-03 NOTE — Telephone Encounter (Signed)
OK for second set of verbal orders as well?

## 2023-01-03 NOTE — Telephone Encounter (Signed)
Called and gave verbal orders for both requests.

## 2023-01-03 NOTE — Telephone Encounter (Signed)
Copied from CRM [id number]. Topic: Quick Communication - Home Health Verbal Orders >> Jan 03, 2023 12:54 PM Everette C wrote: Caller/Agency: [facility]  Callback Number: [phone] Requesting OT/PT/Skilled Nursing/Social Work/Speech Therapy: Speech Therapy  Frequency: 1w8

## 2023-01-03 NOTE — Progress Notes (Signed)
Good morning, please let Tracie Fisher know her labs have returned.  Kidney function remains in stage 3b kidney disease with no worsening. I want her to continue to monitor water intake and ensure no Ibuprofen products taken at home.  Continue with visits with kidney doctor and ensure to schedule with vascular for the swelling in your legs.  Any questions? Keep being amazing!!  Thank you for allowing me to participate in your care.  I appreciate you. Kindest regards, Orean Giarratano

## 2023-01-03 NOTE — Telephone Encounter (Signed)
Copied from CRM [id number]. Topic: Quick Communication - Home Health Verbal Orders >> Jan 03, 2023  3:55 PM Everette C wrote: Caller/Agency: [facility] / [facility]  Callback Number: [phone] Requesting OT/PT/Skilled Nursing/Social Work/Speech Therapy: OT Frequency: 1w7

## 2023-01-04 DIAGNOSIS — N1831 Chronic kidney disease, stage 3a: Secondary | ICD-10-CM | POA: Diagnosis not present

## 2023-01-04 DIAGNOSIS — I69318 Other symptoms and signs involving cognitive functions following cerebral infarction: Secondary | ICD-10-CM | POA: Diagnosis not present

## 2023-01-04 DIAGNOSIS — L03116 Cellulitis of left lower limb: Secondary | ICD-10-CM | POA: Diagnosis not present

## 2023-01-04 DIAGNOSIS — K449 Diaphragmatic hernia without obstruction or gangrene: Secondary | ICD-10-CM | POA: Diagnosis not present

## 2023-01-04 DIAGNOSIS — E785 Hyperlipidemia, unspecified: Secondary | ICD-10-CM | POA: Diagnosis not present

## 2023-01-04 DIAGNOSIS — I499 Cardiac arrhythmia, unspecified: Secondary | ICD-10-CM | POA: Diagnosis not present

## 2023-01-04 DIAGNOSIS — I129 Hypertensive chronic kidney disease with stage 1 through stage 4 chronic kidney disease, or unspecified chronic kidney disease: Secondary | ICD-10-CM | POA: Diagnosis not present

## 2023-01-04 DIAGNOSIS — L03115 Cellulitis of right lower limb: Secondary | ICD-10-CM | POA: Diagnosis not present

## 2023-01-04 DIAGNOSIS — E039 Hypothyroidism, unspecified: Secondary | ICD-10-CM | POA: Diagnosis not present

## 2023-01-04 DIAGNOSIS — Z7902 Long term (current) use of antithrombotics/antiplatelets: Secondary | ICD-10-CM | POA: Diagnosis not present

## 2023-01-04 DIAGNOSIS — H9193 Unspecified hearing loss, bilateral: Secondary | ICD-10-CM | POA: Diagnosis not present

## 2023-01-04 DIAGNOSIS — Z556 Problems related to health literacy: Secondary | ICD-10-CM | POA: Diagnosis not present

## 2023-01-04 DIAGNOSIS — Z85828 Personal history of other malignant neoplasm of skin: Secondary | ICD-10-CM | POA: Diagnosis not present

## 2023-01-05 ENCOUNTER — Telehealth: Payer: Self-pay | Admitting: Nurse Practitioner

## 2023-01-05 NOTE — Telephone Encounter (Signed)
Paper given to provider to review.

## 2023-01-05 NOTE — Telephone Encounter (Signed)
Pt's son dropped an envelope off regarding her medications stated that provider knew that these were being dropped off.  Put in provider's folder.

## 2023-01-06 DIAGNOSIS — H9193 Unspecified hearing loss, bilateral: Secondary | ICD-10-CM | POA: Diagnosis not present

## 2023-01-06 DIAGNOSIS — I129 Hypertensive chronic kidney disease with stage 1 through stage 4 chronic kidney disease, or unspecified chronic kidney disease: Secondary | ICD-10-CM | POA: Diagnosis not present

## 2023-01-06 DIAGNOSIS — E785 Hyperlipidemia, unspecified: Secondary | ICD-10-CM | POA: Diagnosis not present

## 2023-01-06 DIAGNOSIS — Z85828 Personal history of other malignant neoplasm of skin: Secondary | ICD-10-CM | POA: Diagnosis not present

## 2023-01-06 DIAGNOSIS — N1831 Chronic kidney disease, stage 3a: Secondary | ICD-10-CM | POA: Diagnosis not present

## 2023-01-06 DIAGNOSIS — K449 Diaphragmatic hernia without obstruction or gangrene: Secondary | ICD-10-CM | POA: Diagnosis not present

## 2023-01-06 DIAGNOSIS — L03115 Cellulitis of right lower limb: Secondary | ICD-10-CM | POA: Diagnosis not present

## 2023-01-06 DIAGNOSIS — Z7902 Long term (current) use of antithrombotics/antiplatelets: Secondary | ICD-10-CM | POA: Diagnosis not present

## 2023-01-06 DIAGNOSIS — E039 Hypothyroidism, unspecified: Secondary | ICD-10-CM | POA: Diagnosis not present

## 2023-01-06 DIAGNOSIS — I499 Cardiac arrhythmia, unspecified: Secondary | ICD-10-CM | POA: Diagnosis not present

## 2023-01-06 DIAGNOSIS — Z556 Problems related to health literacy: Secondary | ICD-10-CM | POA: Diagnosis not present

## 2023-01-06 DIAGNOSIS — L03116 Cellulitis of left lower limb: Secondary | ICD-10-CM | POA: Diagnosis not present

## 2023-01-06 DIAGNOSIS — I69318 Other symptoms and signs involving cognitive functions following cerebral infarction: Secondary | ICD-10-CM | POA: Diagnosis not present

## 2023-01-10 DIAGNOSIS — E039 Hypothyroidism, unspecified: Secondary | ICD-10-CM | POA: Diagnosis not present

## 2023-01-10 DIAGNOSIS — H9193 Unspecified hearing loss, bilateral: Secondary | ICD-10-CM | POA: Diagnosis not present

## 2023-01-10 DIAGNOSIS — N1831 Chronic kidney disease, stage 3a: Secondary | ICD-10-CM | POA: Diagnosis not present

## 2023-01-10 DIAGNOSIS — L03116 Cellulitis of left lower limb: Secondary | ICD-10-CM | POA: Diagnosis not present

## 2023-01-10 DIAGNOSIS — Z85828 Personal history of other malignant neoplasm of skin: Secondary | ICD-10-CM | POA: Diagnosis not present

## 2023-01-10 DIAGNOSIS — I69318 Other symptoms and signs involving cognitive functions following cerebral infarction: Secondary | ICD-10-CM | POA: Diagnosis not present

## 2023-01-10 DIAGNOSIS — E785 Hyperlipidemia, unspecified: Secondary | ICD-10-CM | POA: Diagnosis not present

## 2023-01-10 DIAGNOSIS — I129 Hypertensive chronic kidney disease with stage 1 through stage 4 chronic kidney disease, or unspecified chronic kidney disease: Secondary | ICD-10-CM | POA: Diagnosis not present

## 2023-01-10 DIAGNOSIS — Z556 Problems related to health literacy: Secondary | ICD-10-CM | POA: Diagnosis not present

## 2023-01-10 DIAGNOSIS — Z7902 Long term (current) use of antithrombotics/antiplatelets: Secondary | ICD-10-CM | POA: Diagnosis not present

## 2023-01-10 DIAGNOSIS — I499 Cardiac arrhythmia, unspecified: Secondary | ICD-10-CM | POA: Diagnosis not present

## 2023-01-10 DIAGNOSIS — K449 Diaphragmatic hernia without obstruction or gangrene: Secondary | ICD-10-CM | POA: Diagnosis not present

## 2023-01-10 DIAGNOSIS — L03115 Cellulitis of right lower limb: Secondary | ICD-10-CM | POA: Diagnosis not present

## 2023-01-11 DIAGNOSIS — E039 Hypothyroidism, unspecified: Secondary | ICD-10-CM | POA: Diagnosis not present

## 2023-01-11 DIAGNOSIS — I69318 Other symptoms and signs involving cognitive functions following cerebral infarction: Secondary | ICD-10-CM | POA: Diagnosis not present

## 2023-01-11 DIAGNOSIS — I499 Cardiac arrhythmia, unspecified: Secondary | ICD-10-CM | POA: Diagnosis not present

## 2023-01-11 DIAGNOSIS — L03116 Cellulitis of left lower limb: Secondary | ICD-10-CM | POA: Diagnosis not present

## 2023-01-11 DIAGNOSIS — N1831 Chronic kidney disease, stage 3a: Secondary | ICD-10-CM | POA: Diagnosis not present

## 2023-01-11 DIAGNOSIS — E785 Hyperlipidemia, unspecified: Secondary | ICD-10-CM | POA: Diagnosis not present

## 2023-01-11 DIAGNOSIS — H9193 Unspecified hearing loss, bilateral: Secondary | ICD-10-CM | POA: Diagnosis not present

## 2023-01-11 DIAGNOSIS — Z556 Problems related to health literacy: Secondary | ICD-10-CM | POA: Diagnosis not present

## 2023-01-11 DIAGNOSIS — L03115 Cellulitis of right lower limb: Secondary | ICD-10-CM | POA: Diagnosis not present

## 2023-01-11 DIAGNOSIS — I129 Hypertensive chronic kidney disease with stage 1 through stage 4 chronic kidney disease, or unspecified chronic kidney disease: Secondary | ICD-10-CM | POA: Diagnosis not present

## 2023-01-11 DIAGNOSIS — Z85828 Personal history of other malignant neoplasm of skin: Secondary | ICD-10-CM | POA: Diagnosis not present

## 2023-01-11 DIAGNOSIS — K449 Diaphragmatic hernia without obstruction or gangrene: Secondary | ICD-10-CM | POA: Diagnosis not present

## 2023-01-11 DIAGNOSIS — Z7902 Long term (current) use of antithrombotics/antiplatelets: Secondary | ICD-10-CM | POA: Diagnosis not present

## 2023-01-13 DIAGNOSIS — I129 Hypertensive chronic kidney disease with stage 1 through stage 4 chronic kidney disease, or unspecified chronic kidney disease: Secondary | ICD-10-CM | POA: Diagnosis not present

## 2023-01-13 DIAGNOSIS — I69318 Other symptoms and signs involving cognitive functions following cerebral infarction: Secondary | ICD-10-CM | POA: Diagnosis not present

## 2023-01-13 DIAGNOSIS — I499 Cardiac arrhythmia, unspecified: Secondary | ICD-10-CM | POA: Diagnosis not present

## 2023-01-13 DIAGNOSIS — N1831 Chronic kidney disease, stage 3a: Secondary | ICD-10-CM | POA: Diagnosis not present

## 2023-01-13 DIAGNOSIS — K449 Diaphragmatic hernia without obstruction or gangrene: Secondary | ICD-10-CM | POA: Diagnosis not present

## 2023-01-13 DIAGNOSIS — Z7902 Long term (current) use of antithrombotics/antiplatelets: Secondary | ICD-10-CM | POA: Diagnosis not present

## 2023-01-13 DIAGNOSIS — E039 Hypothyroidism, unspecified: Secondary | ICD-10-CM | POA: Diagnosis not present

## 2023-01-13 DIAGNOSIS — L03115 Cellulitis of right lower limb: Secondary | ICD-10-CM | POA: Diagnosis not present

## 2023-01-13 DIAGNOSIS — H9193 Unspecified hearing loss, bilateral: Secondary | ICD-10-CM | POA: Diagnosis not present

## 2023-01-13 DIAGNOSIS — Z556 Problems related to health literacy: Secondary | ICD-10-CM | POA: Diagnosis not present

## 2023-01-13 DIAGNOSIS — E785 Hyperlipidemia, unspecified: Secondary | ICD-10-CM | POA: Diagnosis not present

## 2023-01-13 DIAGNOSIS — Z85828 Personal history of other malignant neoplasm of skin: Secondary | ICD-10-CM | POA: Diagnosis not present

## 2023-01-13 DIAGNOSIS — L03116 Cellulitis of left lower limb: Secondary | ICD-10-CM | POA: Diagnosis not present

## 2023-01-16 NOTE — Patient Instructions (Incomplete)
Pregabalin (Lyrica) make sure you are taking this 3 times a day.  Can take Tylenol 1000 MG three times a day -- no more then this.    Edema  Edema is when you have too much fluid in your body or under your skin. Edema may make your legs, feet, and ankles swell. Swelling often happens in looser tissues, such as around your eyes. This is a common condition. It gets more common as you get older. There are many possible causes of edema. These include: Eating too much salt (sodium). Being on your feet or sitting for a long time. Certain medical conditions, such as: Pregnancy. Heart failure. Liver disease. Kidney disease. Cancer. Hot weather may make edema worse. Edema is usually painless. Your skin may look swollen or shiny. Follow these instructions at home: Medicines Take over-the-counter and prescription medicines only as told by your doctor. Your doctor may prescribe a medicine to help your body get rid of extra water (diuretic). Take this medicine if you are told to take it. Eating and drinking Eat a low-salt (low-sodium) diet as told by your doctor. Sometimes, eating less salt may reduce swelling. Depending on the cause of your swelling, you may need to limit how much fluid you drink (fluid restriction). General instructions Raise the injured area above the level of your heart while you are sitting or lying down. Do not sit still or stand for a long time. Do not wear tight clothes. Do not wear garters on your upper legs. Exercise your legs. This can help the swelling go down. Wear compression stockings as told by your doctor. It is important that these are the right size. These should be prescribed by your doctor to prevent possible injuries. If elastic bandages or wraps are recommended, use them as told by your doctor. Contact a doctor if: Treatment is not working. You have heart, liver, or kidney disease and have symptoms of edema. You have sudden and unexplained weight gain. Get  help right away if: You have shortness of breath or chest pain. You cannot breathe when you lie down. You have pain, redness, or warmth in the swollen areas. You have heart, liver, or kidney disease and get edema all of a sudden. You have a fever and your symptoms get worse all of a sudden. These symptoms may be an emergency. Get help right away. Call 911. Do not wait to see if the symptoms will go away. Do not drive yourself to the hospital. Summary Edema is when you have too much fluid in your body or under your skin. Edema may make your legs, feet, and ankles swell. Swelling often happens in looser tissues, such as around your eyes. Raise the injured area above the level of your heart while you are sitting or lying down. Follow your doctor's instructions about diet and how much fluid you can drink. This information is not intended to replace advice given to you by your health care provider. Make sure you discuss any questions you have with your health care provider. Document Revised: 01/04/2021 Document Reviewed: 01/04/2021 Elsevier Patient Education  2024 [employer].

## 2023-01-17 DIAGNOSIS — K449 Diaphragmatic hernia without obstruction or gangrene: Secondary | ICD-10-CM | POA: Diagnosis not present

## 2023-01-17 DIAGNOSIS — E785 Hyperlipidemia, unspecified: Secondary | ICD-10-CM | POA: Diagnosis not present

## 2023-01-17 DIAGNOSIS — H9193 Unspecified hearing loss, bilateral: Secondary | ICD-10-CM | POA: Diagnosis not present

## 2023-01-17 DIAGNOSIS — E039 Hypothyroidism, unspecified: Secondary | ICD-10-CM | POA: Diagnosis not present

## 2023-01-17 DIAGNOSIS — I129 Hypertensive chronic kidney disease with stage 1 through stage 4 chronic kidney disease, or unspecified chronic kidney disease: Secondary | ICD-10-CM | POA: Diagnosis not present

## 2023-01-17 DIAGNOSIS — Z85828 Personal history of other malignant neoplasm of skin: Secondary | ICD-10-CM | POA: Diagnosis not present

## 2023-01-17 DIAGNOSIS — L03116 Cellulitis of left lower limb: Secondary | ICD-10-CM | POA: Diagnosis not present

## 2023-01-17 DIAGNOSIS — L03115 Cellulitis of right lower limb: Secondary | ICD-10-CM | POA: Diagnosis not present

## 2023-01-17 DIAGNOSIS — I69318 Other symptoms and signs involving cognitive functions following cerebral infarction: Secondary | ICD-10-CM | POA: Diagnosis not present

## 2023-01-17 DIAGNOSIS — I499 Cardiac arrhythmia, unspecified: Secondary | ICD-10-CM | POA: Diagnosis not present

## 2023-01-17 DIAGNOSIS — Z556 Problems related to health literacy: Secondary | ICD-10-CM | POA: Diagnosis not present

## 2023-01-17 DIAGNOSIS — Z7902 Long term (current) use of antithrombotics/antiplatelets: Secondary | ICD-10-CM | POA: Diagnosis not present

## 2023-01-17 DIAGNOSIS — N1831 Chronic kidney disease, stage 3a: Secondary | ICD-10-CM | POA: Diagnosis not present

## 2023-01-18 ENCOUNTER — Encounter: Payer: Self-pay | Admitting: Nurse Practitioner

## 2023-01-18 ENCOUNTER — Ambulatory Visit: Payer: Self-pay | Admitting: *Deleted

## 2023-01-18 ENCOUNTER — Ambulatory Visit (INDEPENDENT_AMBULATORY_CARE_PROVIDER_SITE_OTHER): Payer: 59 | Admitting: Nurse Practitioner

## 2023-01-18 ENCOUNTER — Other Ambulatory Visit: Payer: Self-pay | Admitting: Nurse Practitioner

## 2023-01-18 VITALS — BP 132/70 | HR 83 | Temp 97.4°F | Wt 156.0 lb

## 2023-01-18 DIAGNOSIS — H9193 Unspecified hearing loss, bilateral: Secondary | ICD-10-CM | POA: Diagnosis not present

## 2023-01-18 DIAGNOSIS — I499 Cardiac arrhythmia, unspecified: Secondary | ICD-10-CM | POA: Diagnosis not present

## 2023-01-18 DIAGNOSIS — I69318 Other symptoms and signs involving cognitive functions following cerebral infarction: Secondary | ICD-10-CM | POA: Diagnosis not present

## 2023-01-18 DIAGNOSIS — K449 Diaphragmatic hernia without obstruction or gangrene: Secondary | ICD-10-CM | POA: Diagnosis not present

## 2023-01-18 DIAGNOSIS — L03115 Cellulitis of right lower limb: Secondary | ICD-10-CM | POA: Diagnosis not present

## 2023-01-18 DIAGNOSIS — N1831 Chronic kidney disease, stage 3a: Secondary | ICD-10-CM | POA: Diagnosis not present

## 2023-01-18 DIAGNOSIS — R600 Localized edema: Secondary | ICD-10-CM

## 2023-01-18 DIAGNOSIS — Z7902 Long term (current) use of antithrombotics/antiplatelets: Secondary | ICD-10-CM | POA: Diagnosis not present

## 2023-01-18 DIAGNOSIS — Z556 Problems related to health literacy: Secondary | ICD-10-CM | POA: Diagnosis not present

## 2023-01-18 DIAGNOSIS — L03116 Cellulitis of left lower limb: Secondary | ICD-10-CM | POA: Diagnosis not present

## 2023-01-18 DIAGNOSIS — E785 Hyperlipidemia, unspecified: Secondary | ICD-10-CM | POA: Diagnosis not present

## 2023-01-18 DIAGNOSIS — I129 Hypertensive chronic kidney disease with stage 1 through stage 4 chronic kidney disease, or unspecified chronic kidney disease: Secondary | ICD-10-CM | POA: Diagnosis not present

## 2023-01-18 DIAGNOSIS — E039 Hypothyroidism, unspecified: Secondary | ICD-10-CM | POA: Diagnosis not present

## 2023-01-18 DIAGNOSIS — Z85828 Personal history of other malignant neoplasm of skin: Secondary | ICD-10-CM | POA: Diagnosis not present

## 2023-01-18 MED ORDER — PREGABALIN 75 MG PO CAPS: 75.0000 mg | ORAL_CAPSULE | Freq: Three times a day (TID) | ORAL | 1 refills | Status: DC

## 2023-01-18 MED ORDER — CLOPIDOGREL BISULFATE 75 MG PO TABS: 75.0000 mg | ORAL_TABLET | Freq: Every day | ORAL | 4 refills | Status: DC

## 2023-01-18 NOTE — Telephone Encounter (Signed)
  Chief Complaint: Pt requesting a refill of her Lyrica.   She thought she had some but when she got home she does not.   [facility] said a new rx needs to be sent/called in. Symptoms: For my pain Frequency: N/A Pertinent Negatives: Patient denies N/A Disposition: [] ED /[] Urgent Care (no appt availability in office) / [] Appointment(In office/virtual)/ []  [place] [facility]/ [] Home Care/ [] Refused Recommended Disposition /[]  Mobile Bus/ [x]  Follow-up with PCP Additional Notes: Per pt [facility] is needing a new rx for the Lyrica from [facility]  Pt is out of it.  [address] can deliver it.

## 2023-01-18 NOTE — Telephone Encounter (Signed)
Message from [place] M sent at 01/18/2023  2:00 PM EDT  Summary: Pt unsure what medication she was told to take for pain.   Pt unsure what medication she was told to take for pain. Pt requests call back to advise. Cb# [phone]          Call History  Contact Date/Time Type Contact Phone/Fax User  01/18/2023 01:58 PM EDT Phone (Incoming) Asaiah, Milhous (Self) [phone] Judie Petit) Marylen Ponto   Reason for Disposition  [1] Caller has URGENT medicine question about med that PCP or specialist prescribed AND [2] triager unable to answer question  Answer Assessment - Initial Assessment Questions 1. NAME of MEDICINE: "What medicine(s) are you calling about?"     Wants Jolene to call drug store for the  Lyrica.   "I'm out of it and didn't know it until I got home".  2. QUESTION: "What is your question?" (e.g., double dose of medicine, side effect)     [facility] says Jolene needs to call in this prescription for me. 3. PRESCRIBER: "Who prescribed the medicine?" Reason: if prescribed by specialist, call should be referred to that group.     Aura Dials, NP 4. SYMPTOMS: "Do you have any symptoms?" If Yes, ask: "What symptoms are you having?"  "How bad are the symptoms (e.g., mild, moderate, severe)     For my pain 5. PREGNANCY:  "Is there any chance that you are pregnant?" "When was your last menstrual period?"     N/A  Protocols used: Medication Question Call-A-AH

## 2023-01-18 NOTE — Addendum Note (Signed)
Addended by: Aura Dials T on: 01/18/2023 07:04 PM   Modules accepted: Orders

## 2023-01-18 NOTE — Progress Notes (Signed)
BP 132/70   Pulse 83   Temp (!) 97.4 F (36.3 C) (Oral)   Wt 156 lb (70.8 kg)   SpO2 95%   BMI 30.47 kg/m    Subjective:    Patient ID: Tracie Fisher, female    DOB: Jan 31, 1936, 87 y.o.   MRN: [id number]  HPI: Tracie Fisher is a 87 y.o. female  Chief Complaint  Patient presents with   Cellulitis    10 day f/up- pt states she finished taking all of the Doxycycline   SKIN INFECTION Follow-up today for cellulitis, was treated with Doxycyline.  Has chronic edema and pain to lower extremities, with underlying CKD.  Infection has cleared, but continues to have edema and discomfort to both legs.  Is scheduled to see vascular on 02/08/23 for imaging and visit. Is taking Pregabalin TID per neurology for discomfort to legs.  She can not put compression hose on, not enough strength in hands.  Duration: days Location: right lower leg History of trauma in area: no Pain: yes Quality: yes Severity: 5/10 Redness: mild to both lower legs Swelling: yes at baseline to both lower legs Oozing: no Pus: no Fevers: no Nausea/vomiting: no Status: stable Treatments attempted:antibiotics and warm compresses  Estimated Creatinine Clearance: 30.1 mL/min (A) (by C-G formula based on SCr of 1.2 mg/dL (H)).   Relevant past medical, surgical, family and social history reviewed and updated as indicated. Interim medical history since our last visit reviewed. Allergies and medications reviewed and updated.  Review of Systems  Constitutional:  Negative for activity change, appetite change, diaphoresis, fatigue and fever.  Respiratory:  Negative for cough, chest tightness and shortness of breath.   Cardiovascular:  Positive for leg swelling. Negative for chest pain and palpitations.  Gastrointestinal: Negative.   Musculoskeletal:  Positive for arthralgias.  Neurological: Negative.   Psychiatric/Behavioral:  Negative for decreased concentration, self-injury, sleep disturbance and suicidal ideas.     Per HPI unless specifically indicated above     Objective:    BP 132/70   Pulse 83   Temp (!) 97.4 F (36.3 C) (Oral)   Wt 156 lb (70.8 kg)   SpO2 95%   BMI 30.47 kg/m   Wt Readings from Last 3 Encounters:  01/18/23 156 lb (70.8 kg)  01/02/23 153 lb 6.4 oz (69.6 kg)  11/24/22 154 lb 6.4 oz (70 kg)    Physical Exam Vitals and nursing note reviewed.  Constitutional:      General: She is awake. She is not in acute distress.    Appearance: She is well-developed and well-groomed. She is not ill-appearing or toxic-appearing.  HENT:     Head: Normocephalic.     Right Ear: Hearing and external ear normal.     Left Ear: Hearing and external ear normal.  Eyes:     General: Lids are normal.        Right eye: No discharge.        Left eye: No discharge.     Conjunctiva/sclera: Conjunctivae normal.     Pupils: Pupils are equal, round, and reactive to light.  Neck:     Thyroid: No thyromegaly.     Vascular: No carotid bruit.  Cardiovascular:     Rate and Rhythm: Normal rate and regular rhythm.     Pulses:          Dorsalis pedis pulses are 1+ on the right side and 1+ on the left side.       Posterior tibial pulses  are 1+ on the right side and 1+ on the left side.     Heart sounds: Normal heart sounds. No murmur heard.    No gallop.     Comments: Both lower legs with edema and mild redness. Previous area has healed over. Negative Homans bilaterally. Pulmonary:     Effort: Pulmonary effort is normal. No accessory muscle usage or respiratory distress.     Breath sounds: Normal breath sounds.  Abdominal:     General: Bowel sounds are normal. There is no distension.     Palpations: Abdomen is soft.     Tenderness: There is no abdominal tenderness.  Musculoskeletal:     Cervical back: Normal range of motion and neck supple.     Right lower leg: 1+ Pitting Edema present.     Left lower leg: 1+ Pitting Edema present.  Lymphadenopathy:     Cervical: No cervical adenopathy.   Skin:    General: Skin is warm and dry.     Comments: Multiple scattered bruises to BUE.  Neurological:     Mental Status: She is alert and oriented to person, place, and time.     Deep Tendon Reflexes: Reflexes are normal and symmetric.     Reflex Scores:      Brachioradialis reflexes are 2+ on the right side and 2+ on the left side.      Patellar reflexes are 2+ on the right side and 2+ on the left side.    Comments: Oriented to location, date, time, and provider today.  Psychiatric:        Attention and Perception: Attention normal.        Mood and Affect: Mood normal.        Speech: Speech normal.        Behavior: Behavior normal. Behavior is cooperative.        Thought Content: Thought content normal.    Results for orders placed or performed in visit on 01/02/23  Comprehensive metabolic panel  Result Value Ref Range   Glucose 72 70 - 99 mg/dL   BUN 25 8 - 27 mg/dL   Creatinine, Ser 1.61 (H) 0.57 - 1.00 mg/dL   eGFR 44 (L) >09 UE/AVW/0.98   BUN/Creatinine Ratio 21 12 - 28   Sodium 140 134 - 144 mmol/L   Potassium 3.9 3.5 - 5.2 mmol/L   Chloride 103 96 - 106 mmol/L   CO2 21 20 - 29 mmol/L   Calcium 9.3 8.7 - 10.3 mg/dL   Total Protein 5.7 (L) 6.0 - 8.5 g/dL   Albumin 3.9 3.7 - 4.7 g/dL   Globulin, Total 1.8 1.5 - 4.5 g/dL   Bilirubin Total 0.3 0.0 - 1.2 mg/dL   Alkaline Phosphatase 70 44 - 121 IU/L   AST 15 0 - 40 IU/L   ALT 8 0 - 32 IU/L      Assessment & Plan:   Problem List Items Addressed This Visit       Other   Edema of both legs - Primary    Ongoing, suspect PVD and possibly some lymphedema present.  She has difficulty placing compression hose.  Concern for further infections, currently no infection present.  Is scheduled to see vascular upcoming on 25th.  May benefit from compression pumps.      Other Visit Diagnoses     Cellulitis of right lower extremity       Area currently improved.  Monitor closely.        Follow up  plan: Return if  symptoms worsen or fail to improve.

## 2023-01-18 NOTE — Addendum Note (Signed)
Addended by: Pablo Ledger on: 01/18/2023 03:51 PM   Modules accepted: Orders

## 2023-01-18 NOTE — Telephone Encounter (Signed)
Medication Refill - Medication: pregabalin (LYRICA) 75 MG capsule   Has the patient contacted their pharmacy? Yes.   (Agent: If no, request that the patient contact the pharmacy for the refill. If patient does not wish to contact the pharmacy document the reason why and proceed with request.) (Agent: If yes, when and what did the pharmacy advise?)  Preferred Pharmacy (with phone number or street name):  [facility] - [place], [place] - [address] Phone: [phone]  Fax: [phone]     Has the patient been seen for an appointment in the last year OR does the patient have an upcoming appointment? Yes.    Agent: Please be advised that RX refills may take up to 3 business days. We ask that you follow-up with your pharmacy.

## 2023-01-18 NOTE — Assessment & Plan Note (Signed)
Ongoing, suspect PVD and possibly some lymphedema present.  She has difficulty placing compression hose.  Concern for further infections, currently no infection present.  Is scheduled to see vascular upcoming on 25th.  May benefit from compression pumps.

## 2023-01-19 ENCOUNTER — Ambulatory Visit: Payer: Self-pay | Admitting: *Deleted

## 2023-01-19 NOTE — Telephone Encounter (Signed)
Requested medication (s) are due for refill today -no  Requested medication (s) are on the active medication list -yes  Future visit scheduled -yes  Last refill: 01/18/23 #90 1RF  Notes to clinic: non delegated Rx- duplicate request  Requested Prescriptions  Pending Prescriptions Disp Refills   pregabalin (LYRICA) 75 MG capsule 9 capsule 0    Sig: Take 1 capsule (75 mg total) by mouth 3 (three) times daily.     Not Delegated - Neurology:  Anticonvulsants - Controlled - pregabalin Failed - 01/18/2023  3:53 PM      Failed - This refill cannot be delegated      Failed - Cr in normal range and within 360 days    Creatinine  Date Value Ref Range Status  02/13/2013 1.09 0.60 - 1.30 mg/dL Final   Creatinine, Ser  Date Value Ref Range Status  01/02/2023 1.20 (H) 0.57 - 1.00 mg/dL Final         Passed - Completed PHQ-2 or PHQ-9 in the last 360 days      Passed - Valid encounter within last 12 months    Recent Outpatient Visits           Yesterday Edema of both legs   [place] [facility] [place], Corrie Dandy T, NP   2 weeks ago Moderate pulmonary hypertension (HCC)   [place] [facility] [place], Corrie Dandy T, NP   2 months ago Cellulitis of right lower extremity   [place] [facility] [place], [place] T, NP   2 months ago Cellulitis of right lower extremity   [place] [facility] [place], [place] T, NP   2 months ago CKD (chronic kidney disease) stage 4, GFR 15-29 ml/min (HCC)   [place] [facility] [place], [place] T, NP       Future Appointments             In 2 weeks Cannady, Dorie Rank, NP [place] [facility], [facility]               Requested Prescriptions  Pending Prescriptions Disp Refills   pregabalin (LYRICA) 75 MG capsule 9 capsule 0    Sig: Take 1 capsule (75 mg total) by mouth 3 (three) times daily.     Not Delegated - Neurology:  Anticonvulsants - Controlled - pregabalin  Failed - 01/18/2023  3:53 PM      Failed - This refill cannot be delegated      Failed - Cr in normal range and within 360 days    Creatinine  Date Value Ref Range Status  02/13/2013 1.09 0.60 - 1.30 mg/dL Final   Creatinine, Ser  Date Value Ref Range Status  01/02/2023 1.20 (H) 0.57 - 1.00 mg/dL Final         Passed - Completed PHQ-2 or PHQ-9 in the last 360 days      Passed - Valid encounter within last 12 months    Recent Outpatient Visits           Yesterday Edema of both legs   [place] [facility] [place], Corrie Dandy T, NP   2 weeks ago Moderate pulmonary hypertension (HCC)   [place] [facility] [place], Corrie Dandy T, NP   2 months ago Cellulitis of right lower extremity   [place] [facility] [place], Corrie Dandy T, NP   2 months ago Cellulitis of right lower extremity   [place] [facility] [place], [place] T,  NP   2 months ago CKD (chronic kidney disease) stage 4, GFR 15-29 ml/min (HCC)   [place] [facility] [place], Dorie Rank, NP       Future Appointments             In 2 weeks Cannady, Dorie Rank, NP [place] [facility], [facility]

## 2023-01-19 NOTE — Telephone Encounter (Signed)
  Chief Complaint: Leg pain Symptoms: Leg pain both legs, 10/10, some swelling Frequency: "Forever" Pertinent Negatives: Patient denies  Disposition: [] ED /[] Urgent Care (no appt availability in office) / [] Appointment(In office/virtual)/ []  [facility]/ [] Home Care/ [] Refused Recommended Disposition /[]  Mobile Bus/ [x]  Follow-up with PCP Additional Notes:  Pt states Lyrica does not help her legs, "Only my feet." States "I thought she was sending something in for me, not Lyrica, does nothing for leg pain." States she cannot reach other doctor "THey told me to call for pain meds." Unsure of name ?Vascular Assured pt NT would route to practice for PCPs review.  Please advise. # [phone]  Reason for Disposition  [1] SEVERE pain (e.g., excruciating, unable to do any normal activities) AND [2] not improved after 2 hours of pain medicine  Answer Assessment - Initial Assessment Questions 1. ONSET: "When did the pain start?"      Both legs 2. LOCATION: "Where is the pain located?"      Both  3. PAIN: "How bad is the pain?"    (Scale 1-10; or mild, moderate, severe)   -  MILD (1-3): doesn't interfere with normal activities    -  MODERATE (4-7): interferes with normal activities (e.g., work or school) or awakens from sleep, limping    -  SEVERE (8-10): excruciating pain, unable to do any normal activities, unable to walk     10/10 4. WORK OR EXERCISE: "Has there been any recent work or exercise that involved this part of the body?"      No 5. CAUSE: "What do you think is causing the leg pain?"      6. OTHER SYMPTOMS: "Do you have any other symptoms?" (e.g., chest pain, back pain, breathing difficulty, swelling, rash, fever, numbness, weakness)     Some swelling  Protocols used: Leg Pain-A-AH

## 2023-01-19 NOTE — Telephone Encounter (Signed)
Tried calling patient to notify her that medication has been sent in, no answer and VM is full.

## 2023-01-20 ENCOUNTER — Telehealth: Payer: Self-pay | Admitting: Nurse Practitioner

## 2023-01-20 NOTE — Telephone Encounter (Signed)
Noted  

## 2023-01-20 NOTE — Telephone Encounter (Signed)
Quarneka from [place] called sttd that patient refused pt today and [facility] would be assigning her another therapist.

## 2023-01-20 NOTE — Telephone Encounter (Signed)
FYI to provider

## 2023-01-20 NOTE — Telephone Encounter (Signed)
Tried calling patient, no answer and VM not set up. Will try to call again.   OK for [facility] to notify patient of Jolene's message regarding medications if she calls back.

## 2023-01-24 DIAGNOSIS — L03116 Cellulitis of left lower limb: Secondary | ICD-10-CM | POA: Diagnosis not present

## 2023-01-24 DIAGNOSIS — Z556 Problems related to health literacy: Secondary | ICD-10-CM | POA: Diagnosis not present

## 2023-01-24 DIAGNOSIS — I499 Cardiac arrhythmia, unspecified: Secondary | ICD-10-CM | POA: Diagnosis not present

## 2023-01-24 DIAGNOSIS — E785 Hyperlipidemia, unspecified: Secondary | ICD-10-CM | POA: Diagnosis not present

## 2023-01-24 DIAGNOSIS — I129 Hypertensive chronic kidney disease with stage 1 through stage 4 chronic kidney disease, or unspecified chronic kidney disease: Secondary | ICD-10-CM | POA: Diagnosis not present

## 2023-01-24 DIAGNOSIS — I69318 Other symptoms and signs involving cognitive functions following cerebral infarction: Secondary | ICD-10-CM | POA: Diagnosis not present

## 2023-01-24 DIAGNOSIS — N1831 Chronic kidney disease, stage 3a: Secondary | ICD-10-CM | POA: Diagnosis not present

## 2023-01-24 DIAGNOSIS — Z7902 Long term (current) use of antithrombotics/antiplatelets: Secondary | ICD-10-CM | POA: Diagnosis not present

## 2023-01-24 DIAGNOSIS — K449 Diaphragmatic hernia without obstruction or gangrene: Secondary | ICD-10-CM | POA: Diagnosis not present

## 2023-01-24 DIAGNOSIS — L03115 Cellulitis of right lower limb: Secondary | ICD-10-CM | POA: Diagnosis not present

## 2023-01-24 DIAGNOSIS — H9193 Unspecified hearing loss, bilateral: Secondary | ICD-10-CM | POA: Diagnosis not present

## 2023-01-24 DIAGNOSIS — Z85828 Personal history of other malignant neoplasm of skin: Secondary | ICD-10-CM | POA: Diagnosis not present

## 2023-01-24 DIAGNOSIS — E039 Hypothyroidism, unspecified: Secondary | ICD-10-CM | POA: Diagnosis not present

## 2023-01-26 DIAGNOSIS — L03116 Cellulitis of left lower limb: Secondary | ICD-10-CM | POA: Diagnosis not present

## 2023-01-26 DIAGNOSIS — N1831 Chronic kidney disease, stage 3a: Secondary | ICD-10-CM | POA: Diagnosis not present

## 2023-01-26 DIAGNOSIS — E039 Hypothyroidism, unspecified: Secondary | ICD-10-CM | POA: Diagnosis not present

## 2023-01-26 DIAGNOSIS — Z85828 Personal history of other malignant neoplasm of skin: Secondary | ICD-10-CM | POA: Diagnosis not present

## 2023-01-26 DIAGNOSIS — L03115 Cellulitis of right lower limb: Secondary | ICD-10-CM | POA: Diagnosis not present

## 2023-01-26 DIAGNOSIS — E785 Hyperlipidemia, unspecified: Secondary | ICD-10-CM | POA: Diagnosis not present

## 2023-01-26 DIAGNOSIS — Z556 Problems related to health literacy: Secondary | ICD-10-CM | POA: Diagnosis not present

## 2023-01-26 DIAGNOSIS — I129 Hypertensive chronic kidney disease with stage 1 through stage 4 chronic kidney disease, or unspecified chronic kidney disease: Secondary | ICD-10-CM | POA: Diagnosis not present

## 2023-01-26 DIAGNOSIS — H9193 Unspecified hearing loss, bilateral: Secondary | ICD-10-CM | POA: Diagnosis not present

## 2023-01-26 DIAGNOSIS — Z7902 Long term (current) use of antithrombotics/antiplatelets: Secondary | ICD-10-CM | POA: Diagnosis not present

## 2023-01-26 DIAGNOSIS — I69318 Other symptoms and signs involving cognitive functions following cerebral infarction: Secondary | ICD-10-CM | POA: Diagnosis not present

## 2023-01-26 DIAGNOSIS — K449 Diaphragmatic hernia without obstruction or gangrene: Secondary | ICD-10-CM | POA: Diagnosis not present

## 2023-01-26 DIAGNOSIS — I499 Cardiac arrhythmia, unspecified: Secondary | ICD-10-CM | POA: Diagnosis not present

## 2023-01-29 NOTE — Patient Instructions (Signed)

## 2023-01-30 ENCOUNTER — Ambulatory Visit: Payer: Self-pay | Admitting: *Deleted

## 2023-01-30 NOTE — Telephone Encounter (Signed)
  Chief Complaint: Ankle PAin Symptoms: Right leg at ankle, "Front of leg." Shooting pains 10/10 Frequency: 4am this morning Pertinent Negatives: Patient denies swelling "Always red there." Disposition: [x] ED /[] Urgent Care (no appt availability in office) / [] Appointment(In office/virtual)/ []  [place] [facility]/ [] Home Care/ [] Refused Recommended Disposition /[] [place] [facility]/ []  Follow-up with PCP Additional Notes:  Advised ED, declines.Assured pt NT would route to practice for PCPs review. Reason for Disposition  [1] SEVERE pain (e.g., excruciating, unable to walk) AND [2] not improved after 2 hours of pain medicine  Answer Assessment - Initial Assessment Questions 1. ONSET: "When did the pain start?"      This AM 2. LOCATION: "Where is the pain located?"      Right ankle 3. PAIN: "How bad is the pain?"    (Scale 1-10; or mild, moderate, severe)  - MILD (1-3): doesn't interfere with normal activities.   - MODERATE (4-7): interferes with normal activities (e.g., work or school) or awakens from sleep, limping.   - SEVERE (8-10): excruciating pain, unable to do any normal activities, unable to walk.      Shooting pain at ankle, front. 10/10 "Way more." 5. CAUSE: "What do you think is causing the ankle pain?"     unsure 6. OTHER SYMPTOMS: "Do you have any other symptoms?" (e.g., calf pain, rash, fever, swelling)     No swelling, "Always been red."  Protocols used: Ankle Pain-A-AH

## 2023-01-30 NOTE — Telephone Encounter (Signed)
Called and spoke with patient. Notified patient of Jolene's message. Patient has appointment on 02/03/23.

## 2023-01-31 ENCOUNTER — Other Ambulatory Visit: Payer: Self-pay

## 2023-01-31 ENCOUNTER — Other Ambulatory Visit: Payer: 59

## 2023-01-31 DIAGNOSIS — Z556 Problems related to health literacy: Secondary | ICD-10-CM | POA: Diagnosis not present

## 2023-01-31 DIAGNOSIS — E785 Hyperlipidemia, unspecified: Secondary | ICD-10-CM | POA: Diagnosis not present

## 2023-01-31 DIAGNOSIS — N1831 Chronic kidney disease, stage 3a: Secondary | ICD-10-CM | POA: Diagnosis not present

## 2023-01-31 DIAGNOSIS — I499 Cardiac arrhythmia, unspecified: Secondary | ICD-10-CM | POA: Diagnosis not present

## 2023-01-31 DIAGNOSIS — I69318 Other symptoms and signs involving cognitive functions following cerebral infarction: Secondary | ICD-10-CM | POA: Diagnosis not present

## 2023-01-31 DIAGNOSIS — Z7902 Long term (current) use of antithrombotics/antiplatelets: Secondary | ICD-10-CM | POA: Diagnosis not present

## 2023-01-31 DIAGNOSIS — E039 Hypothyroidism, unspecified: Secondary | ICD-10-CM | POA: Diagnosis not present

## 2023-01-31 DIAGNOSIS — K449 Diaphragmatic hernia without obstruction or gangrene: Secondary | ICD-10-CM | POA: Diagnosis not present

## 2023-01-31 DIAGNOSIS — L03116 Cellulitis of left lower limb: Secondary | ICD-10-CM | POA: Diagnosis not present

## 2023-01-31 DIAGNOSIS — Z85828 Personal history of other malignant neoplasm of skin: Secondary | ICD-10-CM | POA: Diagnosis not present

## 2023-01-31 DIAGNOSIS — L03115 Cellulitis of right lower limb: Secondary | ICD-10-CM | POA: Diagnosis not present

## 2023-01-31 DIAGNOSIS — H9193 Unspecified hearing loss, bilateral: Secondary | ICD-10-CM | POA: Diagnosis not present

## 2023-01-31 DIAGNOSIS — I129 Hypertensive chronic kidney disease with stage 1 through stage 4 chronic kidney disease, or unspecified chronic kidney disease: Secondary | ICD-10-CM | POA: Diagnosis not present

## 2023-01-31 NOTE — Patient Instructions (Signed)
Visit Information  Thank you for taking time to visit with me today. Please don't hesitate to contact me if I can be of assistance to you before our next scheduled telephone appointment.  Following are the goals we discussed today:   Goals Addressed             This Visit's Progress    RNCM Care Management Expected Outcome:  Monitor, Self-Manage and Reduce Symptoms of: Memory changes       Current Barriers:  Chronic Disease Management support and education needs related to memory changes   Planned Interventions: Evaluation of current treatment plan related to dementia or memory changes and patient's adherence to plan as established by provider. The patient says that she is not doing well but trying to do better. Is working with PT, OT, and speech therapy in her home. She says they are coming today and today and she is going to let them know she feels she is doing good and does not need any further services. She says that her speech is well. She thinks she has had amble therapy and is doing well. She is independent and self sufficient.  Advised patient to call the office for changes in memory, increased anxiety or acute changes in mental status. Denies any new concerns with her mentation, stress level or other concerns. Provided education to patient re: safety in the home, falls preventions, support system, and medication safety. Has had one fall in the last year. No injuries. Denies any new falls. Reviewed medications with patient and discussed compliance. The patient keeps her medications in their original bottles and she has them set up where she remembers to take them each morning. She denies any issues with making sure she takes her medications as directed. Review of the ability for pharmacy support if needed. The patient denies any needs at this time related to medication needs. Will continue to monitor.  Reviewed scheduled/upcoming provider appointments including 02-03-2023  at 120 pm with  the pcp Discussed plans with patient for ongoing care management follow up and provided patient with direct contact information for care management team Advised patient to discuss changes in her memory, new questions or concerns about her health and well being with provider Screening for signs and symptoms of depression related to chronic disease state  Assessed social determinant of health barriers The patient denies any falls at this time, she uses a rollator walker, does have to pace her activity and rest at times, uses a wheelchair sometimes when she goes to different places.  She still drives and takes care of herself. She states her family is available if needed. Her son [name] always comes on Sundays and they go out to eat or she fixes something for them to eat. She also has 2 sisters that she talks to on a regular basis. She has several friends at the apartment that she lives at. She feels she is doing well to be 87 years old. Review of the availability of the RNCM to help support her with her health and well being. The patient has the [facility] number to call for needs in between outreaches.   Symptom Management: Take medications as prescribed   Attend all scheduled provider appointments Call provider office for new concerns or questions  call the Suicide and Crisis Lifeline: 988 call the [place] National Suicide Prevention Lifeline: 5404839008 or TTY: 606-886-8617 TTY 737-480-4393) to talk to a trained counselor call 1-800-273-TALK (toll free, 24 hour hotline) if experiencing a Mental  Health or Behavioral Health Crisis   Follow Up Plan: Telephone follow up appointment with care management team member scheduled for: 03-10-2023 at 0945 am       RNCM Care Management Expected Outcome:  Monitor, Self-Manage, and Reduce Symptoms of Hypertension       Current Barriers:  Chronic Disease Management support and education needs related to effective management of HTN BP Readings from Last 3  Encounters:  01/18/23 132/70  01/02/23 131/71  12/05/22 138/70     Planned Interventions: Evaluation of current treatment plan related to hypertension self management and patient's adherence to plan as established by provider. Denies any issues with her blood pressures or heart health. She is concerned that she is having swelling, and pain in her legs. She is going to see the pcp on 02-03-2023 and the specialist on 02-08-2023. She just wants to figure out what to do to help with the pain. She wants answers and not suggestions to make her feel better.  Provided education to patient re: stroke prevention, s/s of heart attack and stroke. Has had a stroke in the past. Sees specialist. Her son has been going with her to appointments when needed; Reviewed prescribed diet heart healthy diet Reviewed medications with patient and discussed importance of compliance. The patient states compliance with medications.;  Discussed plans with patient for ongoing care management follow up and provided patient with direct contact information for care management team; Advised patient, providing education and rationale, to monitor blood pressure daily and record, calling PCP for findings outside established parameters;  Reviewed scheduled/upcoming provider appointments including: 02-03-2023 Advised patient to discuss changes in HTN or heart health with provider; Provided education on prescribed diet heart healthy diet;  Discussed complications of poorly controlled blood pressure such as heart disease, stroke, circulatory complications, vision complications, kidney impairment, sexual dysfunction;  Screening for signs and symptoms of depression related to chronic disease state;  Assessed social determinant of health barriers;   Symptom Management: Take medications as prescribed   Attend all scheduled provider appointments Call provider office for new concerns or questions  call the Suicide and Crisis Lifeline:  988 call the [place] National Suicide Prevention Lifeline: 708-143-3885 or TTY: 810-224-5858 TTY 754-633-4107) to talk to a trained counselor call 1-800-273-TALK (toll free, 24 hour hotline) if experiencing a Mental Health or Behavioral Health Crisis  check blood pressure weekly learn about high blood pressure call doctor for signs and symptoms of high blood pressure develop an action plan for high blood pressure keep all doctor appointments take medications for blood pressure exactly as prescribed report new symptoms to your doctor  Follow Up Plan: Telephone follow up appointment with care management team member scheduled for: 03-10-2023 at 0945 am       RNCM Care Management: Maintain, Monitor and Self-Manage Symptoms of COPD       Current Barriers:  Chronic Disease Management support and education needs related to effective management of COPD  Planned Interventions: Provided patient with basic written and verbal COPD education on self care/management/and exacerbation prevention. The patient states she can tell when her allergies and sinuses are acting up. The patient states that sometimes she has some periods of shortness of breath. She paces her activity and knows her limitations. Only uses one pillow at night when sleeping. She denies any acute distress in her breathing today. Feels she is stable.  Advised patient to track and manage COPD triggers. Review of triggers that may exacerbate her conditions. She paces her activity and rest  when she needs to do so. Provided written and verbal instructions on pursed lip breathing and utilized returned demonstration as teach back Provided instruction about proper use of medications used for management of COPD including inhalers Advised patient to self assesses COPD action plan zone and make appointment with provider if in the yellow zone for 48 hours without improvement Provided education about and advised patient to utilize infection prevention  strategies to reduce risk of respiratory infection Discussed the importance of adequate rest and management of fatigue with COPD Screening for signs and symptoms of depression related to chronic disease state  Assessed social determinant of health barriers Review of weather changes and monitoring for acute changes in her breathing. She will see the pcp on 02-03-2023 for follow up.   Symptom Management: Take medications as prescribed   Attend all scheduled provider appointments Call provider office for new concerns or questions  call the Suicide and Crisis Lifeline: 988 call the [place] National Suicide Prevention Lifeline: 602 190 9253 or TTY: (509)728-3480 TTY (234)077-7820) to talk to a trained counselor call 1-800-273-TALK (toll free, 24 hour hotline) if experiencing a Mental Health or Behavioral Health Crisis  avoid second hand smoke eliminate smoking in my home identify and remove indoor air pollutants limit outdoor activity during cold weather listen for public air quality announcements every day do breathing exercises every day develop a rescue plan follow rescue plan if symptoms flare-up  Follow Up Plan: Telephone follow up appointment with care management team member scheduled for: 03-10-2023 at 0945 am           Our next appointment is by telephone on 03-10-2023 at 0945 am  Please call the care guide team at [phone] if you need to cancel or reschedule your appointment.   If you are experiencing a Mental Health or Behavioral Health Crisis or need someone to talk to, please call the Suicide and Crisis Lifeline: 988 call the [place] National Suicide Prevention Lifeline: 626-656-1200 or TTY: 848-135-1121 TTY 208-048-1951) to talk to a trained counselor call 1-800-273-TALK (toll free, 24 hour hotline)   The patient verbalized understanding of instructions, educational materials, and care plan provided today and DECLINED offer to receive copy of patient instructions,  educational materials, and care plan.   Telephone follow up appointment with care management team member scheduled for: 03-10-2023 at 0945 am  Alto Denver RN, MSN, CCM RN Care Manager  [facility]  Ambulatory Care Management  Direct Number: [phone]

## 2023-01-31 NOTE — Patient Outreach (Signed)
Care Management   Visit Note  01/31/2023 Name: ABBIEGAIL Fisher MRN: [id number] DOB: 10/30/1935  Subjective: Tracie Fisher is a 87 y.o. year old female who is a primary care patient of Cannady, Dorie Rank, NP. The Care Management team was consulted for assistance.      Engaged with patient spoke with patient by telephone.    Goals Addressed             This Visit's Progress    RNCM Care Management Expected Outcome:  Monitor, Self-Manage and Reduce Symptoms of: Memory changes       Current Barriers:  Chronic Disease Management support and education needs related to memory changes   Planned Interventions: Evaluation of current treatment plan related to dementia or memory changes and patient's adherence to plan as established by provider. The patient says that she is not doing well but trying to do better. Is working with PT, OT, and speech therapy in her home. She says they are coming today and today and she is going to let them know she feels she is doing good and does not need any further services. She says that her speech is well. She thinks she has had amble therapy and is doing well. She is independent and self sufficient.  Advised patient to call the office for changes in memory, increased anxiety or acute changes in mental status. Denies any new concerns with her mentation, stress level or other concerns. Provided education to patient re: safety in the home, falls preventions, support system, and medication safety. Has had one fall in the last year. No injuries. Denies any new falls. Reviewed medications with patient and discussed compliance. The patient keeps her medications in their original bottles and she has them set up where she remembers to take them each morning. She denies any issues with making sure she takes her medications as directed. Review of the ability for pharmacy support if needed. The patient denies any needs at this time related to medication needs. Will continue to  monitor.  Reviewed scheduled/upcoming provider appointments including 02-03-2023  at 120 pm with the pcp Discussed plans with patient for ongoing care management follow up and provided patient with direct contact information for care management team Advised patient to discuss changes in her memory, new questions or concerns about her health and well being with provider Screening for signs and symptoms of depression related to chronic disease state  Assessed social determinant of health barriers The patient denies any falls at this time, she uses a rollator walker, does have to pace her activity and rest at times, uses a wheelchair sometimes when she goes to different places.  She still drives and takes care of herself. She states her family is available if needed. Her son [name] always comes on Sundays and they go out to eat or she fixes something for them to eat. She also has 2 sisters that she talks to on a regular basis. She has several friends at the apartment that she lives at. She feels she is doing well to be 87 years old. Review of the availability of the RNCM to help support her with her health and well being. The patient has the [facility] number to call for needs in between outreaches.   Symptom Management: Take medications as prescribed   Attend all scheduled provider appointments Call provider office for new concerns or questions  call the Suicide and Crisis Lifeline: 988 call the [place] National Suicide Prevention Lifeline: (713) 045-9448 or TTY:  865-488-8587 TTY 307-003-6820) to talk to a trained counselor call 1-800-273-TALK (toll free, 24 hour hotline) if experiencing a Mental Health or Behavioral Health Crisis   Follow Up Plan: Telephone follow up appointment with care management team member scheduled for: 03-10-2023 at 0945 am       RNCM Care Management Expected Outcome:  Monitor, Self-Manage, and Reduce Symptoms of Hypertension       Current Barriers:  Chronic Disease Management  support and education needs related to effective management of HTN BP Readings from Last 3 Encounters:  01/18/23 132/70  01/02/23 131/71  12/05/22 138/70     Planned Interventions: Evaluation of current treatment plan related to hypertension self management and patient's adherence to plan as established by provider. Denies any issues with her blood pressures or heart health. She is concerned that she is having swelling, and pain in her legs. She is going to see the pcp on 02-03-2023 and the specialist on 02-08-2023. She just wants to figure out what to do to help with the pain. She wants answers and not suggestions to make her feel better.  Provided education to patient re: stroke prevention, s/s of heart attack and stroke. Has had a stroke in the past. Sees specialist. Her son has been going with her to appointments when needed; Reviewed prescribed diet heart healthy diet Reviewed medications with patient and discussed importance of compliance. The patient states compliance with medications.;  Discussed plans with patient for ongoing care management follow up and provided patient with direct contact information for care management team; Advised patient, providing education and rationale, to monitor blood pressure daily and record, calling PCP for findings outside established parameters;  Reviewed scheduled/upcoming provider appointments including: 02-03-2023 Advised patient to discuss changes in HTN or heart health with provider; Provided education on prescribed diet heart healthy diet;  Discussed complications of poorly controlled blood pressure such as heart disease, stroke, circulatory complications, vision complications, kidney impairment, sexual dysfunction;  Screening for signs and symptoms of depression related to chronic disease state;  Assessed social determinant of health barriers;   Symptom Management: Take medications as prescribed   Attend all scheduled provider appointments Call  provider office for new concerns or questions  call the Suicide and Crisis Lifeline: 988 call the [place] National Suicide Prevention Lifeline: (407) 405-3796 or TTY: 430 400 4752 TTY [phone]) to talk to a trained counselor call 1-800-273-TALK (toll free, 24 hour hotline) if experiencing a Mental Health or Behavioral Health Crisis  check blood pressure weekly learn about high blood pressure call doctor for signs and symptoms of high blood pressure develop an action plan for high blood pressure keep all doctor appointments take medications for blood pressure exactly as prescribed report new symptoms to your doctor  Follow Up Plan: Telephone follow up appointment with care management team member scheduled for: 03-10-2023 at 0945 am       RNCM Care Management: Maintain, Monitor and Self-Manage Symptoms of COPD       Current Barriers:  Chronic Disease Management support and education needs related to effective management of COPD  Planned Interventions: Provided patient with basic written and verbal COPD education on self care/management/and exacerbation prevention. The patient states she can tell when her allergies and sinuses are acting up. The patient states that sometimes she has some periods of shortness of breath. She paces her activity and knows her limitations. Only uses one pillow at night when sleeping. She denies any acute distress in her breathing today. Feels she is stable.  Advised patient  to track and manage COPD triggers. Review of triggers that may exacerbate her conditions. She paces her activity and rest when she needs to do so. Provided written and verbal instructions on pursed lip breathing and utilized returned demonstration as teach back Provided instruction about proper use of medications used for management of COPD including inhalers Advised patient to self assesses COPD action plan zone and make appointment with provider if in the yellow zone for 48 hours without  improvement Provided education about and advised patient to utilize infection prevention strategies to reduce risk of respiratory infection Discussed the importance of adequate rest and management of fatigue with COPD Screening for signs and symptoms of depression related to chronic disease state  Assessed social determinant of health barriers Review of weather changes and monitoring for acute changes in her breathing. She will see the pcp on 02-03-2023 for follow up.   Symptom Management: Take medications as prescribed   Attend all scheduled provider appointments Call provider office for new concerns or questions  call the Suicide and Crisis Lifeline: 988 call the [place] National Suicide Prevention Lifeline: 684-274-1970 or TTY: (913) 142-6636 TTY (352)187-5287) to talk to a trained counselor call 1-800-273-TALK (toll free, 24 hour hotline) if experiencing a Mental Health or Behavioral Health Crisis  avoid second hand smoke eliminate smoking in my home identify and remove indoor air pollutants limit outdoor activity during cold weather listen for public air quality announcements every day do breathing exercises every day develop a rescue plan follow rescue plan if symptoms flare-up  Follow Up Plan: Telephone follow up appointment with care management team member scheduled for: 03-10-2023 at 0945 am          Consent to Services:  Patient was given information about care management services, agreed to services, and gave verbal consent to participate.   Plan: Telephone follow up appointment with care management team member scheduled for: 03-10-2023 at 0945 am  Alto Denver RN, MSN, CCM RN Care Manager  [facility]  Ambulatory Care Management  Direct Number: [phone]

## 2023-02-03 ENCOUNTER — Ambulatory Visit: Payer: 59 | Admitting: Nurse Practitioner

## 2023-02-03 ENCOUNTER — Ambulatory Visit (INDEPENDENT_AMBULATORY_CARE_PROVIDER_SITE_OTHER): Payer: 59 | Admitting: Nurse Practitioner

## 2023-02-03 ENCOUNTER — Encounter: Payer: Self-pay | Admitting: Nurse Practitioner

## 2023-02-03 VITALS — BP 117/65 | HR 85 | Temp 97.7°F | Ht 60.7 in | Wt 154.6 lb

## 2023-02-03 DIAGNOSIS — E782 Mixed hyperlipidemia: Secondary | ICD-10-CM | POA: Diagnosis not present

## 2023-02-03 DIAGNOSIS — L03116 Cellulitis of left lower limb: Secondary | ICD-10-CM | POA: Diagnosis not present

## 2023-02-03 DIAGNOSIS — N184 Chronic kidney disease, stage 4 (severe): Secondary | ICD-10-CM

## 2023-02-03 DIAGNOSIS — I10 Essential (primary) hypertension: Secondary | ICD-10-CM | POA: Diagnosis not present

## 2023-02-03 DIAGNOSIS — R413 Other amnesia: Secondary | ICD-10-CM

## 2023-02-03 DIAGNOSIS — E039 Hypothyroidism, unspecified: Secondary | ICD-10-CM

## 2023-02-03 DIAGNOSIS — G629 Polyneuropathy, unspecified: Secondary | ICD-10-CM | POA: Diagnosis not present

## 2023-02-03 DIAGNOSIS — I272 Pulmonary hypertension, unspecified: Secondary | ICD-10-CM | POA: Diagnosis not present

## 2023-02-03 DIAGNOSIS — L03115 Cellulitis of right lower limb: Secondary | ICD-10-CM | POA: Diagnosis not present

## 2023-02-03 DIAGNOSIS — F5101 Primary insomnia: Secondary | ICD-10-CM | POA: Diagnosis not present

## 2023-02-03 DIAGNOSIS — D692 Other nonthrombocytopenic purpura: Secondary | ICD-10-CM

## 2023-02-03 DIAGNOSIS — Z23 Encounter for immunization: Secondary | ICD-10-CM

## 2023-02-03 DIAGNOSIS — K449 Diaphragmatic hernia without obstruction or gangrene: Secondary | ICD-10-CM | POA: Diagnosis not present

## 2023-02-03 DIAGNOSIS — H9193 Unspecified hearing loss, bilateral: Secondary | ICD-10-CM | POA: Diagnosis not present

## 2023-02-03 DIAGNOSIS — Z85828 Personal history of other malignant neoplasm of skin: Secondary | ICD-10-CM | POA: Diagnosis not present

## 2023-02-03 DIAGNOSIS — I69318 Other symptoms and signs involving cognitive functions following cerebral infarction: Secondary | ICD-10-CM | POA: Diagnosis not present

## 2023-02-03 DIAGNOSIS — N1831 Chronic kidney disease, stage 3a: Secondary | ICD-10-CM | POA: Diagnosis not present

## 2023-02-03 DIAGNOSIS — E785 Hyperlipidemia, unspecified: Secondary | ICD-10-CM | POA: Diagnosis not present

## 2023-02-03 DIAGNOSIS — I129 Hypertensive chronic kidney disease with stage 1 through stage 4 chronic kidney disease, or unspecified chronic kidney disease: Secondary | ICD-10-CM | POA: Diagnosis not present

## 2023-02-03 DIAGNOSIS — Z7902 Long term (current) use of antithrombotics/antiplatelets: Secondary | ICD-10-CM | POA: Diagnosis not present

## 2023-02-03 DIAGNOSIS — I499 Cardiac arrhythmia, unspecified: Secondary | ICD-10-CM | POA: Diagnosis not present

## 2023-02-03 DIAGNOSIS — Z556 Problems related to health literacy: Secondary | ICD-10-CM | POA: Diagnosis not present

## 2023-02-03 MED ORDER — ALPRAZOLAM 1 MG PO TABS
ORAL_TABLET | ORAL | 2 refills | Status: DC
Start: 2023-02-03 — End: 2023-02-03

## 2023-02-03 MED ORDER — ALPRAZOLAM 1 MG PO TABS
ORAL_TABLET | ORAL | 2 refills | Status: DC
Start: 2023-02-03 — End: 2023-05-25

## 2023-02-03 NOTE — Assessment & Plan Note (Addendum)
Chronic, ongoing.  Continue current medication regimen and adjust as needed.  Labs today.

## 2023-02-03 NOTE — Assessment & Plan Note (Signed)
Chronic, ongoing.  Continues to be followed by neurology.  Will continue this collaboration and medication regimen as ordered by them.  Recent note reviewed.

## 2023-02-03 NOTE — Assessment & Plan Note (Signed)
Chronic, stable.  BP well below goal for her age.  Recommend she monitor BP at least a few mornings a week at home and document.  DASH diet at home.  Continue Torsemide only.  Collaboration with cardiology - recent note reviewed and medication changes.  Labs today: CBC and CMP.  Urine ALB 25 June 2022.

## 2023-02-03 NOTE — Assessment & Plan Note (Signed)
Noted on past diagnoses, 6CIT 14 April, difficulty with recall, but MMSE today 30/30 and good recall present.  No current memory medications, may benefit from this in future.  Continue collaboration with neurology.  CCM team involved.  May benefit imaging in future.  Have requested she bring a family member or friend to visits, but she reports they all work and are unable to attend.  This may be beneficial to have at visits though to help her with recall.

## 2023-02-03 NOTE — Assessment & Plan Note (Signed)
Chronic, ongoing.  Is being followed by nephrology.  Currently CKD 4.  Educated her on diet and fluid intake + current medications. Urine ALB 25 June 2022.  Recommend continue all current medications and wear compression hose at home on during day and off at night -- she can not get these on.  Labs today.

## 2023-02-03 NOTE — Assessment & Plan Note (Signed)
Chronic, ongoing.  Followed by cardiology, continue current medication regimen as prescribed by them and collaboration.  Recent notes reviewed. They have adjusted medications.

## 2023-02-03 NOTE — Progress Notes (Signed)
BP 117/65   Pulse 85   Temp 97.7 F (36.5 C) (Oral)   Ht 5' 0.7" (1.542 m)   Wt 154 lb 9.6 oz (70.1 kg)   SpO2 94%   BMI 29.50 kg/m    Subjective:    Patient ID: Tracie Fisher, female    DOB: 27-Feb-1936, 87 y.o.   MRN: [id number]  HPI: Tracie Fisher is a 87 y.o. female  Chief Complaint  Patient presents with   Anxiety   Hyperlipidemia   Hypertension   Insomnia   Memory Check   CHRONIC KIDNEY DISEASE (Stage 4) Follows with Dr. Thedore Mins, nephrology, on 09/26/22 -- returns on 03/29/23. Recommended to reduce Spironolactone to 3 times a week if ongoing elevation in K+. Continues to have edema to both legs. She can not put compression hose on, not enough strength in hands.  She reports having some pain on occasion to lower legs.  CrCl 38. CKD status: stable Medications renally dose: yes Previous renal evaluation: yes Pneumovax:  Not up to Date Influenza Vaccine:  Up to Date     Latest Ref Rng & Units 01/02/2023    1:58 PM 12/04/2022    3:51 AM 12/03/2022    6:42 AM  BMP  Glucose 70 - 99 mg/dL 72  91  91   BUN 8 - 27 mg/dL 25  19  19    Creatinine 0.57 - 1.00 mg/dL 2.13  0.86  5.78   BUN/Creat Ratio 12 - 28 21     Sodium 134 - 144 mmol/L 140  131  131   Potassium 3.5 - 5.2 mmol/L 3.9  3.9  3.9   Chloride 96 - 106 mmol/L 103  99  99   CO2 20 - 29 mmol/L 21  23  24    Calcium 8.7 - 10.3 mg/dL 9.3  8.7  8.6       HYPERTENSION / HYPERLIPIDEMIA Taking Torsemide, Plavix, and Pravastatin. Moderate pulmonary hypertension. Cardiology held her Losartan and Spironolactone on 12/26/22.  Last saw Dr. Juliann Pares 10/21/22, echo March 2024 65-70%, Grade diastolic I dysfunction. There is mildly elevated pulmonary artery systolic pressure.  Mild MR.     History of stroke years ago and then recent stroke on 08/04/22. Last neurology visit 09/27/22.  Taking Lyrica 75 MG TID for peripheral neuropathy, although she reports this does not work very well.  Is going to see vascular on the 25th for assessment  of leg pain and to see if compression wraps an option. Satisfied with current treatment? yes Duration of hypertension: chronic BP monitoring frequency: daily BP range: 110-120/70 range BP medication side effects: no Duration of hyperlipidemia: chronic Cholesterol medication side effects: no Cholesterol supplements: none Medication compliance: good compliance Aspirin: no Recent stressors: no Recurrent headaches: no Visual changes: no Palpitations: no Dyspnea: occasionally a little bit Chest pain: no Lower extremity edema: at baseline, can not put on compression hose Dizzy/lightheaded: no     09/13/2022   11:20 AM 07/15/2022   11:31 AM  6CIT Screen  What Year? 0 points 0 points  What month? 0 points 0 points  What time? 0 points 0 points  Count back from 20 4 points 0 points  Months in reverse 4 points 4 points  Repeat phrase 6 points 8 points  Total Score 14 points 12 points      02/03/2023    1:49 PM  MMSE - Mini Mental State Exam  Orientation to time 5  Orientation to Place 5  Registration  3  Attention/ Calculation 5  Recall 3  Language- name 2 objects 2  Language- repeat 1  Language- follow 3 step command 3  Language- read & follow direction 1  Write a sentence 1  Copy design 1  Total score 30    Relevant past medical, surgical, family and social history reviewed and updated as indicated. Interim medical history since our last visit reviewed. Allergies and medications reviewed and updated.  Review of Systems  Constitutional:  Negative for activity change, appetite change, diaphoresis, fatigue and fever.  Respiratory:  Negative for cough, chest tightness and shortness of breath.   Cardiovascular:  Positive for leg swelling. Negative for chest pain and palpitations.  Gastrointestinal: Negative.   Neurological: Negative.   Psychiatric/Behavioral:  Negative for decreased concentration, self-injury, sleep disturbance and suicidal ideas.     Per HPI unless  specifically indicated above     Objective:    BP 117/65   Pulse 85   Temp 97.7 F (36.5 C) (Oral)   Ht 5' 0.7" (1.542 m)   Wt 154 lb 9.6 oz (70.1 kg)   SpO2 94%   BMI 29.50 kg/m   Wt Readings from Last 3 Encounters:  02/03/23 154 lb 9.6 oz (70.1 kg)  01/18/23 156 lb (70.8 kg)  01/02/23 153 lb 6.4 oz (69.6 kg)    Physical Exam Vitals and nursing note reviewed.  Constitutional:      General: She is awake. She is not in acute distress.    Appearance: She is well-developed and well-groomed. She is not ill-appearing or toxic-appearing.  HENT:     Head: Normocephalic.     Right Ear: Hearing and external ear normal.     Left Ear: Hearing and external ear normal.  Eyes:     General: Lids are normal.        Right eye: No discharge.        Left eye: No discharge.     Conjunctiva/sclera: Conjunctivae normal.     Pupils: Pupils are equal, round, and reactive to light.  Neck:     Thyroid: No thyromegaly.     Vascular: No carotid bruit.  Cardiovascular:     Rate and Rhythm: Normal rate and regular rhythm.     Pulses:          Dorsalis pedis pulses are 1+ on the right side and 1+ on the left side.       Posterior tibial pulses are 1+ on the right side and 1+ on the left side.     Heart sounds: Normal heart sounds. No murmur heard.    No gallop.     Comments: Both lower legs with edema and mild redness.  No open wounds or warmth.   Pulmonary:     Effort: Pulmonary effort is normal. No accessory muscle usage or respiratory distress.     Breath sounds: Normal breath sounds.  Abdominal:     General: Bowel sounds are normal. There is no distension.     Palpations: Abdomen is soft.     Tenderness: There is no abdominal tenderness.  Musculoskeletal:     Cervical back: Normal range of motion and neck supple.     Right lower leg: 1+ Pitting Edema present.     Left lower leg: 1+ Pitting Edema present.  Lymphadenopathy:     Cervical: No cervical adenopathy.  Skin:    General: Skin  is warm and dry.  Neurological:     Mental Status: She is alert and  oriented to person, place, and time.     Deep Tendon Reflexes: Reflexes are normal and symmetric.     Reflex Scores:      Brachioradialis reflexes are 2+ on the right side and 2+ on the left side.      Patellar reflexes are 2+ on the right side and 2+ on the left side.    Comments: Oriented to location, date, time, and provider today.  Psychiatric:        Attention and Perception: Attention normal.        Mood and Affect: Mood normal.        Speech: Speech normal.        Behavior: Behavior normal. Behavior is cooperative.        Thought Content: Thought content normal.    Results for orders placed or performed in visit on 01/02/23  Comprehensive metabolic panel  Result Value Ref Range   Glucose 72 70 - 99 mg/dL   BUN 25 8 - 27 mg/dL   Creatinine, Ser 1.61 (H) 0.57 - 1.00 mg/dL   eGFR 44 (L) >09 UE/AVW/0.98   BUN/Creatinine Ratio 21 12 - 28   Sodium 140 134 - 144 mmol/L   Potassium 3.9 3.5 - 5.2 mmol/L   Chloride 103 96 - 106 mmol/L   CO2 21 20 - 29 mmol/L   Calcium 9.3 8.7 - 10.3 mg/dL   Total Protein 5.7 (L) 6.0 - 8.5 g/dL   Albumin 3.9 3.7 - 4.7 g/dL   Globulin, Total 1.8 1.5 - 4.5 g/dL   Bilirubin Total 0.3 0.0 - 1.2 mg/dL   Alkaline Phosphatase 70 44 - 121 IU/L   AST 15 0 - 40 IU/L   ALT 8 0 - 32 IU/L      Assessment & Plan:   Problem List Items Addressed This Visit       Cardiovascular and Mediastinum   Essential hypertension    Chronic, stable.  BP well below goal for her age.  Recommend she monitor BP at least a few mornings a week at home and document.  DASH diet at home.  Continue Torsemide only.  Collaboration with cardiology - recent note reviewed and medication changes.  Labs today: CBC and CMP.  Urine ALB 25 June 2022.         Relevant Orders   Comprehensive metabolic panel   CBC with Differential/Platelet   Moderate pulmonary hypertension (HCC)    Chronic, ongoing.  Followed by  cardiology, continue current medication regimen as prescribed by them and collaboration.  Recent notes reviewed. They have adjusted medications.        Nervous and Auditory   Neuropathy    Chronic, ongoing.  Continues to be followed by neurology.  Will continue this collaboration and medication regimen as ordered by them.  Recent note reviewed.        Genitourinary   CKD (chronic kidney disease) stage 4, GFR 15-29 ml/min (HCC) - Primary    Chronic, ongoing.  Is being followed by nephrology.  Currently CKD 4.  Educated her on diet and fluid intake + current medications. Urine ALB 25 June 2022.  Recommend continue all current medications and wear compression hose at home on during day and off at night -- she can not get these on.  Labs today.        Other   Insomnia    Chronic, ongoing for years.  Previous PCP, Dr. Arlana Pouch, placed her on Xanax many years ago and she still takes,  reports she cannot sleep without it.  At this time will continue current regimen and adjust if possible.  Aware of need for every 3 month visits + a controlled substance agreement at next visit and UDS up to date (due next 08/04/23).  Return in 3 months.      Relevant Medications   ALPRAZolam (XANAX) 1 MG tablet   Loss of memory    Noted on past diagnoses, 6CIT 14 April, difficulty with recall, but MMSE today 30/30 and good recall present.  No current memory medications, may benefit from this in future.  Continue collaboration with neurology.  CCM team involved.  May benefit imaging in future.  Have requested she bring a family member or friend to visits, but she reports they all work and are unable to attend.  This may be beneficial to have at visits though to help her with recall.      Mixed hyperlipidemia    Chronic, ongoing.  Continue current medication regimen and adjust as needed.  Labs today.      Relevant Orders   Comprehensive metabolic panel   Lipid Panel w/o Chol/HDL Ratio   Other Visit Diagnoses      Flu vaccine need       Flu vaccine today, educated on this.   Relevant Orders   Flu Vaccine Trivalent High Dose (Fluad) (Completed)        Follow up plan: Return in about 3 months (around 05/05/2023) for INSOMNIA, HTN/HLD.

## 2023-02-03 NOTE — Assessment & Plan Note (Signed)
Chronic, ongoing for years.  Previous PCP, Dr. Arlana Pouch, placed her on Xanax many years ago and she still takes, reports she cannot sleep without it.  At this time will continue current regimen and adjust if possible.  Aware of need for every 3 month visits + a controlled substance agreement at next visit and UDS up to date (due next 08/04/23).  Return in 3 months.

## 2023-02-04 ENCOUNTER — Other Ambulatory Visit: Payer: Self-pay | Admitting: Nurse Practitioner

## 2023-02-04 DIAGNOSIS — N184 Chronic kidney disease, stage 4 (severe): Secondary | ICD-10-CM | POA: Insufficient documentation

## 2023-02-04 LAB — CBC WITH DIFFERENTIAL/PLATELET
Basophils Absolute: 0 10*3/uL (ref 0.0–0.2)
Basos: 1 %
EOS (ABSOLUTE): 0.3 10*3/uL (ref 0.0–0.4)
Eos: 4 %
Hematocrit: 29.3 % — ABNORMAL LOW (ref 34.0–46.6)
Hemoglobin: 9 g/dL — ABNORMAL LOW (ref 11.1–15.9)
Immature Grans (Abs): 0 10*3/uL (ref 0.0–0.1)
Immature Granulocytes: 0 %
Lymphocytes Absolute: 2.3 10*3/uL (ref 0.7–3.1)
Lymphs: 33 %
MCH: 31.6 pg (ref 26.6–33.0)
MCHC: 30.7 g/dL — ABNORMAL LOW (ref 31.5–35.7)
MCV: 103 fL — ABNORMAL HIGH (ref 79–97)
Monocytes Absolute: 0.9 10*3/uL (ref 0.1–0.9)
Monocytes: 12 %
Neutrophils Absolute: 3.5 10*3/uL (ref 1.4–7.0)
Neutrophils: 50 %
Platelets: 343 10*3/uL (ref 150–450)
RBC: 2.85 x10E6/uL — ABNORMAL LOW (ref 3.77–5.28)
RDW: 12.7 % (ref 11.7–15.4)
WBC: 7 10*3/uL (ref 3.4–10.8)

## 2023-02-04 LAB — COMPREHENSIVE METABOLIC PANEL
ALT: 10 IU/L (ref 0–32)
AST: 23 IU/L (ref 0–40)
Albumin: 4 g/dL (ref 3.7–4.7)
Alkaline Phosphatase: 84 IU/L (ref 44–121)
BUN/Creatinine Ratio: 19 (ref 12–28)
BUN: 23 mg/dL (ref 8–27)
Bilirubin Total: 0.2 mg/dL (ref 0.0–1.2)
CO2: 24 mmol/L (ref 20–29)
Calcium: 9.1 mg/dL (ref 8.7–10.3)
Chloride: 102 mmol/L (ref 96–106)
Creatinine, Ser: 1.2 mg/dL — ABNORMAL HIGH (ref 0.57–1.00)
Globulin, Total: 1.9 g/dL (ref 1.5–4.5)
Glucose: 86 mg/dL (ref 70–99)
Potassium: 3.8 mmol/L (ref 3.5–5.2)
Sodium: 142 mmol/L (ref 134–144)
Total Protein: 5.9 g/dL — ABNORMAL LOW (ref 6.0–8.5)
eGFR: 44 mL/min/{1.73_m2} — ABNORMAL LOW (ref >59)

## 2023-02-04 LAB — LIPID PANEL W/O CHOL/HDL RATIO
Cholesterol, Total: 163 mg/dL (ref 100–199)
HDL: 63 mg/dL (ref >39)
LDL Chol Calc (NIH): 76 mg/dL (ref 0–99)
Triglycerides: 138 mg/dL (ref 0–149)
VLDL Cholesterol Cal: 24 mg/dL (ref 5–40)

## 2023-02-04 NOTE — Progress Notes (Signed)
Good morning, please let Tracie Fisher know her labs have returned: - Kidney function continues to show Stage 3b kidney disease with no worsening.  Ensure good water intake at home.  Liver function is normal. - Cholesterol levels are stable, continue all current medications. - Your CBC is showing a little more anemia.  I would like you to come for lab only visit in 2 weeks to recheck this.  My staff will schedule a lab only visit for you (please schedule).  This is most likely related to your kidney disease.  I also recommend eating iron rich foods like deep leafy greens and a little red meat.  Any questions? Keep being wonderful!!  Thank you for allowing me to participate in your care.  I appreciate you. Kindest regards, Tracie Fisher

## 2023-02-06 ENCOUNTER — Other Ambulatory Visit (INDEPENDENT_AMBULATORY_CARE_PROVIDER_SITE_OTHER): Payer: Self-pay | Admitting: Nurse Practitioner

## 2023-02-06 DIAGNOSIS — R600 Localized edema: Secondary | ICD-10-CM

## 2023-02-08 ENCOUNTER — Ambulatory Visit (INDEPENDENT_AMBULATORY_CARE_PROVIDER_SITE_OTHER): Payer: 59

## 2023-02-08 ENCOUNTER — Encounter (INDEPENDENT_AMBULATORY_CARE_PROVIDER_SITE_OTHER): Payer: Self-pay | Admitting: Nurse Practitioner

## 2023-02-08 ENCOUNTER — Ambulatory Visit (INDEPENDENT_AMBULATORY_CARE_PROVIDER_SITE_OTHER): Payer: 59 | Admitting: Nurse Practitioner

## 2023-02-08 VITALS — BP 163/73 | HR 67 | Resp 18 | Ht 61.0 in | Wt 154.0 lb

## 2023-02-08 DIAGNOSIS — L97909 Non-pressure chronic ulcer of unspecified part of unspecified lower leg with unspecified severity: Secondary | ICD-10-CM

## 2023-02-08 DIAGNOSIS — I890 Lymphedema, not elsewhere classified: Secondary | ICD-10-CM | POA: Diagnosis not present

## 2023-02-08 DIAGNOSIS — R600 Localized edema: Secondary | ICD-10-CM

## 2023-02-08 DIAGNOSIS — G629 Polyneuropathy, unspecified: Secondary | ICD-10-CM

## 2023-02-08 DIAGNOSIS — I83009 Varicose veins of unspecified lower extremity with ulcer of unspecified site: Secondary | ICD-10-CM

## 2023-02-08 DIAGNOSIS — I10 Essential (primary) hypertension: Secondary | ICD-10-CM | POA: Diagnosis not present

## 2023-02-08 NOTE — Progress Notes (Signed)
Subjective:    Patient ID: Tracie Fisher, female    DOB: 22-Jan-1936, 87 y.o.   MRN: [id number] Chief Complaint  Patient presents with   New Patient (Initial Visit)    NP consult with reflux Edema of both legs    Dan Mamon is an 87 year old female who returns to [place] today for follow-up evaluation of bilateral lower extremity edema.  She was last seen approximately 4 years ago and at that time she noted that her swelling was under better control.  However recently the patient's swelling has been out of control and she has had some recent issues.  She was recently hospitalized due to cellulitis of the lower extremities.  Today she does have notable swelling bilaterally with no evidence of cellulitis.  The right lower extremity has a small open area.  There is also associated skin changes noted bilaterally such as papillomatosis.  She has previously worn medical grade compression but recently has become more difficult as she has become more weak.  She also elevates her lower extremities but this does not make a drastic difference in the improvement of the swelling.  She also has a number of comorbidities including diastolic heart failure and stage IIIb chronic kidney disease which certainly worsen her swelling.  She has also been restarted on her diuretics which has been helpful for the swelling but it has not completely resolved this.  She also has some significant neuropathy which has been causing her pain and discomfort as well.  Today noninvasive study showed no evidence of DVT or superficial thrombophlebitis bilaterally.  No evidence of deep venous insufficiency bilaterally.  No evidence of superficial venous reflux noted bilaterally    Review of Systems  Cardiovascular:  Positive for leg swelling.  Neurological:  Positive for weakness.  All other systems reviewed and are negative.      Objective:   Physical Exam Vitals reviewed.  HENT:     Head: Normocephalic.  Cardiovascular:      Rate and Rhythm: Normal rate.  Pulmonary:     Effort: Pulmonary effort is normal.  Musculoskeletal:     Right lower leg: 2+ Edema present.     Left lower leg: 2+ Edema present.  Skin:    General: Skin is warm and dry.  Neurological:     Mental Status: She is alert and oriented to person, place, and time.     Motor: Weakness present.  Psychiatric:        Mood and Affect: Mood normal.        Behavior: Behavior normal.        Thought Content: Thought content normal.        Judgment: Judgment normal.     BP (!) 163/73 (BP Location: Left Arm)   Pulse 67   Resp 18   Ht 5\' 1"  (1.549 m)   Wt 154 lb (69.9 kg)   BMI 29.10 kg/m   Past Medical History:  Diagnosis Date   Cancer (HCC)    SKIN   Complication of anesthesia    Depression    Dysrhythmia    GERD (gastroesophageal reflux disease)    Hiatal hernia    HOH (hard of hearing)    Hypertension    Hypothyroidism    PONV (postoperative nausea and vomiting)    Thyroid disease     Social History   Socioeconomic History   Marital status: Single    Spouse name: Not on file   Number of children: Not on file  Years of education: Not on file   Highest education level: Not on file  Occupational History   Not on file  Tobacco Use   Smoking status: Former    Types: Cigarettes   Smokeless tobacco: Never  Vaping Use   Vaping status: Never Used  Substance and Sexual Activity   Alcohol use: Yes    Comment: 1 beer /day or 2 occ   Drug use: No   Sexual activity: Yes    Birth control/protection: Post-menopausal  Other Topics Concern   Not on file  Social History Narrative   Not on file   Social Determinants of Health   Financial Resource Strain: Low Risk  (09/13/2022)   Overall Financial Resource Strain (CARDIA)    Difficulty of Paying Living Expenses: Not hard at all  Food Insecurity: No Food Insecurity (11/25/2022)   Hunger Vital Sign    Worried About Running Out of Food in the Last Year: Never true    Ran Out of  Food in the Last Year: Never true  Transportation Needs: No Transportation Needs (11/25/2022)   PRAPARE - Administrator, Civil Service (Medical): No    Lack of Transportation (Non-Medical): No  Physical Activity: Insufficiently Active (09/13/2022)   Exercise Vital Sign    Days of Exercise per Week: 2 days    Minutes of Exercise per Session: 20 min  Stress: No Stress Concern Present (09/13/2022)   [employer] of Occupational Health - Occupational Stress Questionnaire    Feeling of Stress : Not at all  Social Connections: Socially Isolated (09/13/2022)   Social Connection and Isolation Panel [NHANES]    Frequency of Communication with Friends and Family: More than three times a week    Frequency of Social Gatherings with Friends and Family: More than three times a week    Attends Religious Services: Never    Database administrator or Organizations: No    Attends Banker Meetings: Never    Marital Status: Widowed  Intimate Partner Violence: Not At Risk (11/25/2022)   Humiliation, Afraid, Rape, and Kick questionnaire    Fear of Current or Ex-Partner: No    Emotionally Abused: No    Physically Abused: No    Sexually Abused: No    Past Surgical History:  Procedure Laterality Date   APPENDECTOMY     BACK SURGERY     MULTIPLE/ROD IN BACK   BREAST CYST ASPIRATION Left "years ago"   CATARACT EXTRACTION W/PHACO Left 03/21/2017   Procedure: CATARACT EXTRACTION PHACO AND INTRAOCULAR LENS PLACEMENT (IOC);  Surgeon: Galen Manila, MD;  Location: [facility] ORS;  Service: Ophthalmology;  Laterality: Left;  [place] 00:48 AP% 17.8 CDE 8.57 Fluid pack lot # [id number]   CATARACT EXTRACTION W/PHACO Right 04/11/2017   Procedure: CATARACT EXTRACTION PHACO AND INTRAOCULAR LENS PLACEMENT (IOC);  Surgeon: Galen Manila, MD;  Location: [facility];  Service: Ophthalmology;  Laterality: Right;  [place] 01:09.5 AP% 15.8 CDE 11.04 Fluid Pack lot # [id number]   CHOLECYSTECTOMY     COLON  SURGERY     BOWEL OBSTRUCTION   COLONOSCOPY WITH PROPOFOL N/A 04/25/2016   Procedure: COLONOSCOPY WITH PROPOFOL;  Surgeon: Wyline Mood, MD;  Location: [facility] END[facility];  Service: Endoscopy;  Laterality: N/A;   ESOPHAGOGASTRODUODENOSCOPY (EGD) WITH PROPOFOL N/A 04/25/2016   Procedure: ESOPHAGOGASTRODUODENOSCOPY (EGD) WITH PROPOFOL;  Surgeon: Wyline Mood, MD;  Location: [facility];  Service: Endoscopy;  Laterality: N/A;   EXPLORATORY LAPAROTOMY  10/18/2011   blockage small intestines   GIVENS CAPSULE  STUDY N/A 05/11/2016   Procedure: GIVENS CAPSULE STUDY;  Surgeon: Wyline Mood, MD;  Location: [facility];  Service: Endoscopy;  Laterality: N/A;    Family History  Problem Relation Age of Onset   Breast cancer Sister 60   Heart attack Brother    Heart disease Father     Allergies  Allergen Reactions   Aspirin Itching   Penicillins Other (See Comments)    Unknown reaction Has patient had a PCN reaction causing immediate rash, facial/tongue/throat swelling, SOB or lightheadedness with hypotension: Unknown Has patient had a PCN reaction causing severe rash involving mucus membranes or skin necrosis: Unknown Has patient had a PCN reaction that required hospitalization: Unknown Has patient had a PCN reaction occurring within the last 10 years: No If all of the above answers are "NO", then may proceed with Cephalosporin use. Patient does not remember the reaction to penicillin.   Penicillin G     Unknown reaction Has patient had a PCN reaction causing immediate rash, facial/tongue/throat swelling, SOB or lightheadedness with hypotension: Unknown Has patient had a PCN reaction causing severe rash involving mucus membranes or skin necrosis: Unknown Has patient had a PCN reaction that required hospitalization: Unknown Has patient had a PCN reaction occurring within the last 10 years: No If all of the above answers are "NO", then may proceed with Cephalosporin use.        Latest Ref Rng &  Units 02/03/2023    2:05 PM 12/03/2022    6:42 AM 12/02/2022    8:15 AM  CBC  WBC 3.4 - 10.8 x10E3/uL 7.0  6.4  8.6   Hemoglobin 11.1 - 15.9 g/dL 9.0  16.1  09.6   Hematocrit 34.0 - 46.6 % 29.3  34.4  34.2   Platelets 150 - 450 x10E3/uL 343  298  317       CMP     Component Value Date/Time   NA 142 02/03/2023 1405   NA 136 02/13/2013 2024   K 3.8 02/03/2023 1405   K 4.0 02/13/2013 2024   CL 102 02/03/2023 1405   CL 103 02/13/2013 2024   CO2 24 02/03/2023 1405   CO2 22 02/13/2013 2024   GLUCOSE 86 02/03/2023 1405   GLUCOSE 91 12/04/2022 0351   GLUCOSE 93 02/13/2013 2024   BUN 23 02/03/2023 1405   BUN 14 02/13/2013 2024   CREATININE 1.20 (H) 02/03/2023 1405   CREATININE 1.09 02/13/2013 2024   CALCIUM 9.1 02/03/2023 1405   CALCIUM 9.8 02/13/2013 2024   PROT 5.9 (L) 02/03/2023 1405   PROT 7.2 02/13/2013 2024   ALBUMIN 4.0 02/03/2023 1405   ALBUMIN 3.8 02/13/2013 2024   AST 23 02/03/2023 1405   AST 32 02/13/2013 2024   ALT 10 02/03/2023 1405   ALT 28 02/13/2013 2024   ALKPHOS 84 02/03/2023 1405   ALKPHOS 96 02/13/2013 2024   BILITOT 0.2 02/03/2023 1405   BILITOT 0.5 02/13/2013 2024   EGFR 44 (L) 02/03/2023 1405   GFRNONAA 49 (L) 12/04/2022 0351   GFRNONAA 50 (L) 02/13/2013 2024     No results found.     Assessment & Plan:   1. Lymphedema Recommend:  No surgery or intervention at this point in time.   The Patient is CEAP [id number].  The patient has been wearing compression for more than 12 weeks with no or little benefit.  The patient has been exercising daily for more than 12 weeks. The patient has been elevating and taking OTC pain medications for  more than 12 weeks.  None of these have have eliminated the pain related to the lymphedema or the discomfort regarding excessive swelling and venous congestion.    I have reviewed my discussion with the patient regarding lymphedema and why it  causes symptoms.  Patient will continue wearing graduated compression on a  daily basis. The patient should put the compression on first thing in the morning and removing them in the evening. The patient should not sleep in the compression.   In addition, behavioral modification throughout the day will be continued.  This will include frequent elevation (such as in a recliner), use of over the counter pain medications as needed and exercise such as walking.  The systemic causes for chronic edema such as liver, kidney and cardiac etiologies do not appear to have significant changed over the past year.    The patient has chronic , severe lymphedema with hyperpigmentation of the skin and has done MLD, skin care, medication, diet, exercise, elevation and compression for 4 weeks with no improvement,  I am recommending a lymphedema pump.  The patient still has stage 3 lymphedema and therefore, I believe that a lymph pump is needed to improve the control of the patient's lymphedema and improve the quality of life.  Additionally, a lymph pump is warranted because it will reduce the risk of cellulitis and ulceration in the future.  2. Essential hypertension Continue antihypertensive medications as already ordered, these medications have been reviewed and there are no changes at this time.  3. Venous ulcer (HCC) No surgery or intervention at this point in time.    I have had a long discussion with the patient regarding venous insufficiency and why it  causes symptoms, specifically venous ulceration. I have discussed with the patient the chronic skin changes that accompany lymph edema and the long term sequela such as infection and recurring  ulceration.  Patient will be placed in [employer] which will be changed weekly drainage permitting.  In addition, behavioral modification including several periods of elevation of the lower extremities during the day will be continued. Achieving a position with the ankles at heart level was stressed to the patient   4. Neuropathy I discussed  with the patient and son that the patient's lymphedema and swelling is not the cause of her neuropathy but due to the inflammation creates it can certainly worsen.  Ongoing treatment of lymphedema may provide a small benefit for her pain   Current Outpatient Medications on File Prior to Visit  Medication Sig Dispense Refill   ALPRAZolam (XANAX) 1 MG tablet Take 0.5 mg (1/2 tablet) by mouth in the morning and 1 mg (1 tablet) by mouth at bedtime. 45 tablet 2   clopidogrel (PLAVIX) 75 MG tablet Take 1 tablet (75 mg total) by mouth daily. 90 tablet 4   cyanocobalamin (V-R VITAMIN B-12) 500 MCG tablet Take 500 mcg by mouth daily.      ferrous gluconate (FERGON) 240 (27 FE) MG tablet Take 240 mg by mouth 2 (two) times daily.     levothyroxine (SYNTHROID) 50 MCG tablet Take 1 tablet (50 mcg total) by mouth daily before breakfast. 90 tablet 4   mupirocin ointment (BACTROBAN) 2 % Apply 1 Application topically 2 (two) times daily. 22 g 4   pravastatin (PRAVACHOL) 20 MG tablet TAKE 1 TABLET BY MOUTH EVERY EVENING 90 tablet 4   pregabalin (LYRICA) 75 MG capsule Take 1 capsule (75 mg total) by mouth 3 (three) times daily. 90 capsule  1   Probiotic Product (PROBIOTIC DAILY PO) Take 1 capsule by mouth daily as needed.     torsemide (DEMADEX) 20 MG tablet Take 20 mg by mouth daily.     No current facility-administered medications on file prior to visit.    There are no Patient Instructions on file for this visit. No follow-ups on file.   Georgiana Spinner, NP

## 2023-02-14 DIAGNOSIS — Z9181 History of falling: Secondary | ICD-10-CM | POA: Diagnosis not present

## 2023-02-14 DIAGNOSIS — I10 Essential (primary) hypertension: Secondary | ICD-10-CM | POA: Diagnosis not present

## 2023-02-14 DIAGNOSIS — R262 Difficulty in walking, not elsewhere classified: Secondary | ICD-10-CM | POA: Diagnosis not present

## 2023-02-14 DIAGNOSIS — M6281 Muscle weakness (generalized): Secondary | ICD-10-CM | POA: Diagnosis not present

## 2023-02-14 DIAGNOSIS — R2689 Other abnormalities of gait and mobility: Secondary | ICD-10-CM | POA: Diagnosis not present

## 2023-02-15 ENCOUNTER — Encounter (INDEPENDENT_AMBULATORY_CARE_PROVIDER_SITE_OTHER): Payer: Self-pay

## 2023-02-15 ENCOUNTER — Ambulatory Visit (INDEPENDENT_AMBULATORY_CARE_PROVIDER_SITE_OTHER): Payer: 59 | Admitting: Nurse Practitioner

## 2023-02-15 VITALS — BP 144/77 | HR 75 | Resp 16

## 2023-02-15 DIAGNOSIS — I890 Lymphedema, not elsewhere classified: Secondary | ICD-10-CM | POA: Diagnosis not present

## 2023-02-15 NOTE — Progress Notes (Signed)
History of Present Illness  There is no documented history at this time  Assessments & Plan   There are no diagnoses linked to this encounter.    Additional instructions  Subjective:  Patient presents with venous ulcer of the Bilateral lower extremity.    Procedure:  3 layer unna wrap was placed Bilateral lower extremity.   Plan:   Follow up in one week.  

## 2023-02-17 ENCOUNTER — Telehealth: Payer: Self-pay

## 2023-02-17 ENCOUNTER — Ambulatory Visit (INDEPENDENT_AMBULATORY_CARE_PROVIDER_SITE_OTHER): Payer: 59

## 2023-02-17 DIAGNOSIS — Z23 Encounter for immunization: Secondary | ICD-10-CM | POA: Diagnosis not present

## 2023-02-17 NOTE — Telephone Encounter (Signed)
Pt given lab results per notes of Jolene, NP from 02/03/23 on 02/17/23. Pt verbalized understanding. Pt states she has appt on 02/21/23 and will have labs drawn then. Didn't want to make lab appt at this time.   Marjie Skiff, NP 02/04/2023  7:36 AM EDT     Good morning, please let Tracie Fisher know her labs have returned: - Kidney function continues to show Stage 3b kidney disease with no worsening.  Ensure good water intake at home.  Liver function is normal. - Cholesterol levels are stable, continue all current medications. - Your CBC is showing a little more anemia.  I would like you to come for lab only visit in 2 weeks to recheck this.  My staff will schedule a lab only visit for you (please schedule).  This is most likely related to your kidney disease.  I also recommend eating iron rich foods like deep leafy greens and a little red meat.  Any questions? Keep being wonderful!!  Thank you for allowing me to participate in your care.  I appreciate you. Kindest regards, Jolene

## 2023-02-18 NOTE — Patient Instructions (Signed)
Fall Prevention in the Home, Adult Falls can cause injuries and can happen to people of all ages. There are many things you can do to make your home safer and to help prevent falls. What actions can I take to prevent falls? General information Use good lighting in all rooms. Make sure to: Replace any light bulbs that burn out. Turn on the lights in dark areas and use night-lights. Keep items that you use often in easy-to-reach places. Lower the shelves around your home if needed. Move furniture so that there are clear paths around it. Do not use throw rugs or other things on the floor that can make you trip. If any of your floors are uneven, fix them. Add color or contrast paint or tape to clearly mark and help you see: Grab bars or handrails. First and last steps of staircases. Where the edge of each step is. If you use a ladder or stepladder: Make sure that it is fully opened. Do not climb a closed ladder. Make sure the sides of the ladder are locked in place. Have someone hold the ladder while you use it. Know where your pets are as you move through your home. What can I do in the bathroom?     Keep the floor dry. Clean up any water on the floor right away. Remove soap buildup in the bathtub or shower. Buildup makes bathtubs and showers slippery. Use non-skid mats or decals on the floor of the bathtub or shower. Attach bath mats securely with double-sided, non-slip rug tape. If you need to sit down in the shower, use a non-slip stool. Install grab bars by the toilet and in the bathtub and shower. Do not use towel bars as grab bars. What can I do in the bedroom? Make sure that you have a light by your bed that is easy to reach. Do not use any sheets or blankets on your bed that hang to the floor. Have a firm chair or bench with side arms that you can use for support when you get dressed. What can I do in the kitchen? Clean up any spills right away. If you need to reach something  above you, use a step stool with a grab bar. Keep electrical cords out of the way. Do not use floor polish or wax that makes floors slippery. What can I do with my stairs? Do not leave anything on the stairs. Make sure that you have a light switch at the top and the bottom of the stairs. Make sure that there are handrails on both sides of the stairs. Fix handrails that are broken or loose. Install non-slip stair treads on all your stairs if they do not have carpet. Avoid having throw rugs at the top or bottom of the stairs. Choose a carpet that does not hide the edge of the steps on the stairs. Make sure that the carpet is firmly attached to the stairs. Fix carpet that is loose or worn. What can I do on the outside of my home? Use bright outdoor lighting. Fix the edges of walkways and driveways and fix any cracks. Clear paths of anything that can make you trip, such as tools or rocks. Add color or contrast paint or tape to clearly mark and help you see anything that might make you trip as you walk through a door, such as a raised step or threshold. Trim any bushes or trees on paths to your home. Check to see if handrails are loose   or broken and that both sides of all steps have handrails. Install guardrails along the edges of any raised decks and porches. Have leaves, snow, or ice cleared regularly. Use sand, salt, or ice melter on paths if you live where there is ice and snow during the winter. Clean up any spills in your garage right away. This includes grease or oil spills. What other actions can I take? Review your medicines with your doctor. Some medicines can cause dizziness or changes in blood pressure, which increase your risk of falling. Wear shoes that: Have a low heel. Do not wear high heels. Have rubber bottoms and are closed at the toe. Feel good on your feet and fit well. Use tools that help you move around if needed. These include: Canes. Walkers. Scooters. Crutches. Ask  your doctor what else you can do to help prevent falls. This may include seeing a physical therapist to learn to do exercises to move better and get stronger. Where to find more information Centers for Disease Control and Prevention, [employer]: cdc.gov National Institute on Aging: nia.nih.gov National Institute on Aging: nia.nih.gov Contact a doctor if: You are afraid of falling at home. You feel weak, drowsy, or dizzy at home. You fall at home. Get help right away if you: Lose consciousness or have trouble moving after a fall. Have a fall that causes a head injury. These symptoms may be an emergency. Get help right away. Call 911. Do not wait to see if the symptoms will go away. Do not drive yourself to the hospital. This information is not intended to replace advice given to you by your health care provider. Make sure you discuss any questions you have with your health care provider. Document Revised: 01/03/2022 Document Reviewed: 01/03/2022 Elsevier Patient Education  2024 [employer] Inc.  

## 2023-02-21 ENCOUNTER — Ambulatory Visit (INDEPENDENT_AMBULATORY_CARE_PROVIDER_SITE_OTHER): Payer: 59 | Admitting: Nurse Practitioner

## 2023-02-21 ENCOUNTER — Encounter: Payer: Self-pay | Admitting: Nurse Practitioner

## 2023-02-21 VITALS — BP 136/73 | HR 67 | Temp 97.9°F | Ht 61.0 in | Wt 149.8 lb

## 2023-02-21 DIAGNOSIS — W19XXXA Unspecified fall, initial encounter: Secondary | ICD-10-CM | POA: Insufficient documentation

## 2023-02-21 DIAGNOSIS — N184 Chronic kidney disease, stage 4 (severe): Secondary | ICD-10-CM | POA: Diagnosis not present

## 2023-02-21 DIAGNOSIS — D631 Anemia in chronic kidney disease: Secondary | ICD-10-CM | POA: Diagnosis not present

## 2023-02-21 LAB — CBC
Hematocrit: 33.3 % — ABNORMAL LOW (ref 34.0–46.6)
Hemoglobin: 10.9 g/dL — ABNORMAL LOW (ref 11.1–15.9)
MCH: 32 pg (ref 26.6–33.0)
MCHC: 32.7 g/dL (ref 31.5–35.7)
MCV: 98 fL — ABNORMAL HIGH (ref 79–97)
Platelets: 178 10*3/uL (ref 150–450)
RBC: 3.41 x10E6/uL — ABNORMAL LOW (ref 3.77–5.28)
RDW: 13.3 % (ref 11.7–15.4)
WBC: 6 10*3/uL (ref 3.4–10.8)

## 2023-02-21 NOTE — Progress Notes (Signed)
BP 136/73   Pulse 67   Temp 97.9 F (36.6 C) (Oral)   Ht 5\' 1"  (1.549 m)   Wt 149 lb 12.8 oz (67.9 kg)   SpO2 98%   BMI 28.30 kg/m    Subjective:    Patient ID: Tracie Fisher, female    DOB: 05/25/1935, 87 y.o.   MRN: [id number]  HPI: Tracie Fisher is a 87 y.o. female  Chief Complaint  Patient presents with   Fall   Peripheral Neuropathy   ANEMIA AND FALLS Presents today for concern for falls, this was after getting her Covid vaccine 02/17/23. Went to get out of bed, fell over walker (is an old light weight one), fell on bottom with bruise to both buttocks.  Then went in bathroom and fell backwards hitting back of her head on soft carpet area -- no LOC at the time.  Head is no longer sore and she reports not hitting that hard and was soft area.  Denies headaches, vision changes, slurred speech, dizziness.  Whole body has felt out of whack since vaccination.  When she is walking she feels like she is not walking like she should be walking.  Feels off balance at times, this started after falls.  History of stroke on 08/04/22.  Recent labs did show some ongoing anemia, has CKD and sees nephrology.  Takes two iron pills daily, ferrous gluconate.   Followed by neurology for sensory ataxia, history of stroke, and unsteady gait.  Last in office visit with Dr. Malvin Johns was 06/08/22.  Currently takes Lyrica 75 MG TID + recently saw vascular and they are working on getting lymphedema pump + they placed Unna boot on vascular ulcer noted at her visit with them on 02/08/23.  Has wraps to both legs, goes every Wednesday to get it checked. Anemia status: recent levels lower Etiology of anemia: CKD Duration of anemia treatment: long term Compliance with treatment: good compliance Iron supplementation side effects: no Severity of anemia: moderate Fatigue: yes and some weakness Decreased exercise tolerance: no  Dyspnea on exertion: no Palpitations: no Bleeding: no Pica: no   Relevant past  medical, surgical, family and social history reviewed and updated as indicated. Interim medical history since our last visit reviewed. Allergies and medications reviewed and updated.  Review of Systems  Constitutional:  Positive for fatigue. Negative for activity change, appetite change, diaphoresis and unexpected weight change.  Respiratory:  Negative for cough, chest tightness, shortness of breath and wheezing.   Cardiovascular:  Negative for chest pain, palpitations and leg swelling (Unna on.).  Gastrointestinal: Negative.   Neurological:  Positive for weakness. Negative for dizziness, tremors, syncope, facial asymmetry, speech difficulty, light-headedness, numbness and headaches.  Psychiatric/Behavioral:  Positive for sleep disturbance. Negative for agitation, confusion, self-injury and suicidal ideas. The patient is not nervous/anxious.    Per HPI unless specifically indicated above     Objective:    BP 136/73   Pulse 67   Temp 97.9 F (36.6 C) (Oral)   Ht 5\' 1"  (1.549 m)   Wt 149 lb 12.8 oz (67.9 kg)   SpO2 98%   BMI 28.30 kg/m   Wt Readings from Last 3 Encounters:  02/21/23 149 lb 12.8 oz (67.9 kg)  02/08/23 154 lb (69.9 kg)  02/03/23 154 lb 9.6 oz (70.1 kg)    Physical Exam Vitals and nursing note reviewed.  Constitutional:      General: She is awake. She is not in acute distress.  Appearance: She is well-developed and well-groomed. She is not ill-appearing or toxic-appearing.  HENT:     Head: Normocephalic.     Right Ear: Hearing and external ear normal.     Left Ear: Hearing and external ear normal.  Eyes:     General: Lids are normal.        Right eye: No discharge.        Left eye: No discharge.     Conjunctiva/sclera: Conjunctivae normal.     Pupils: Pupils are equal, round, and reactive to light.  Neck:     Thyroid: No thyromegaly.     Vascular: No carotid bruit.  Cardiovascular:     Rate and Rhythm: Normal rate and regular rhythm.     Heart sounds:  Normal heart sounds. No murmur heard.    No gallop.     Comments: Unna to both lower legs. Pulmonary:     Effort: Pulmonary effort is normal. No accessory muscle usage or respiratory distress.     Breath sounds: Normal breath sounds.  Abdominal:     General: Bowel sounds are normal. There is no distension.     Palpations: Abdomen is soft.     Tenderness: There is no abdominal tenderness.  Musculoskeletal:     Cervical back: Normal range of motion and neck supple.  Lymphadenopathy:     Cervical: No cervical adenopathy.  Skin:    General: Skin is warm and dry.  Neurological:     Mental Status: She is alert and oriented to person, place, and time.     Cranial Nerves: Cranial nerves 2-12 are intact.     Motor: Motor function is intact.     Coordination: Coordination is intact.     Gait: Gait is intact.     Deep Tendon Reflexes: Reflexes are normal and symmetric.     Reflex Scores:      Brachioradialis reflexes are 2+ on the right side and 2+ on the left side.      Patellar reflexes are 2+ on the right side and 2+ on the left side.    Comments: Oriented to location, date, time, and provider today. Strength BUE and BLE 4/5.  Gait stable with cane in use.  Psychiatric:        Attention and Perception: Attention normal.        Mood and Affect: Mood normal.        Speech: Speech normal.        Behavior: Behavior normal. Behavior is cooperative.        Thought Content: Thought content normal.    Results for orders placed or performed in visit on 02/03/23  Comprehensive metabolic panel  Result Value Ref Range   Glucose 86 70 - 99 mg/dL   BUN 23 8 - 27 mg/dL   Creatinine, Ser 6.57 (H) 0.57 - 1.00 mg/dL   eGFR 44 (L) >84 ON/GEX/5.28   BUN/Creatinine Ratio 19 12 - 28   Sodium 142 134 - 144 mmol/L   Potassium 3.8 3.5 - 5.2 mmol/L   Chloride 102 96 - 106 mmol/L   CO2 24 20 - 29 mmol/L   Calcium 9.1 8.7 - 10.3 mg/dL   Total Protein 5.9 (L) 6.0 - 8.5 g/dL   Albumin 4.0 3.7 - 4.7 g/dL    Globulin, Total 1.9 1.5 - 4.5 g/dL   Bilirubin Total 0.2 0.0 - 1.2 mg/dL   Alkaline Phosphatase 84 44 - 121 IU/L   AST 23 0 - 40 IU/L  ALT 10 0 - 32 IU/L  CBC with Differential/Platelet  Result Value Ref Range   WBC 7.0 3.4 - 10.8 x10E3/uL   RBC 2.85 (L) 3.77 - 5.28 x10E6/uL   Hemoglobin 9.0 (L) 11.1 - 15.9 g/dL   Hematocrit 84.1 (L) 66.0 - 46.6 %   MCV 103 (H) 79 - 97 fL   MCH 31.6 26.6 - 33.0 pg   MCHC 30.7 (L) 31.5 - 35.7 g/dL   RDW 63.0 16.0 - 10.9 %   Platelets 343 150 - 450 x10E3/uL   Neutrophils 50 Not Estab. %   Lymphs 33 Not Estab. %   Monocytes 12 Not Estab. %   Eos 4 Not Estab. %   Basos 1 Not Estab. %   Neutrophils Absolute 3.5 1.4 - 7.0 x10E3/uL   Lymphocytes Absolute 2.3 0.7 - 3.1 x10E3/uL   Monocytes Absolute 0.9 0.1 - 0.9 x10E3/uL   EOS (ABSOLUTE) 0.3 0.0 - 0.4 x10E3/uL   Basophils Absolute 0.0 0.0 - 0.2 x10E3/uL   Immature Granulocytes 0 Not Estab. %   Immature Grans (Abs) 0.0 0.0 - 0.1 x10E3/uL  Lipid Panel w/o Chol/HDL Ratio  Result Value Ref Range   Cholesterol, Total 163 100 - 199 mg/dL   Triglycerides 323 0 - 149 mg/dL   HDL 63 >55 mg/dL   VLDL Cholesterol Cal 24 5 - 40 mg/dL   LDL Chol Calc (NIH) 76 0 - 99 mg/dL      Assessment & Plan:   Problem List Items Addressed This Visit       Other   Anemia due to stage 4 chronic kidney disease (HCC) - Primary    Chronic, ongoing.  Last levels slightly lower, but in office today H/H 10.9/33.3.  Some improvement.  Will check iron and ferritin.  May need hematology involvement if ongoing lows and fatigue. Continue iron BID.      Relevant Orders   CBC with Differential/Platelet   Iron Binding Cap (TIBC)([facility])   Ferritin   CBC   Fall    Had x 2 falls after Covid vaccination.  ?reaction to vaccine.  She reports no injuries, except bruises.  Has felt out of whack since vaccination.  Recommend increased hydration.  Discussed bringing in PT to home for strengthening, she refuses at this time  due to so many upcoming provider visits.        Follow up plan: Return in about 1 week (around 02/28/2023) for WEAKNESS AND ANEMIA.

## 2023-02-21 NOTE — Assessment & Plan Note (Signed)
Had x 2 falls after Covid vaccination.  ?reaction to vaccine.  She reports no injuries, except bruises.  Has felt out of whack since vaccination.  Recommend increased hydration.  Discussed bringing in PT to home for strengthening, she refuses at this time due to so many upcoming provider visits.

## 2023-02-21 NOTE — Assessment & Plan Note (Signed)
Chronic, ongoing.  Last levels slightly lower, but in office today H/H 10.9/33.3.  Some improvement.  Will check iron and ferritin.  May need hematology involvement if ongoing lows and fatigue. Continue iron BID.

## 2023-02-22 ENCOUNTER — Encounter (INDEPENDENT_AMBULATORY_CARE_PROVIDER_SITE_OTHER): Payer: Self-pay | Admitting: Nurse Practitioner

## 2023-02-22 ENCOUNTER — Ambulatory Visit (INDEPENDENT_AMBULATORY_CARE_PROVIDER_SITE_OTHER): Payer: 59 | Admitting: Nurse Practitioner

## 2023-02-22 DIAGNOSIS — R0989 Other specified symptoms and signs involving the circulatory and respiratory systems: Secondary | ICD-10-CM | POA: Diagnosis not present

## 2023-02-22 DIAGNOSIS — H538 Other visual disturbances: Secondary | ICD-10-CM | POA: Diagnosis not present

## 2023-02-22 DIAGNOSIS — G629 Polyneuropathy, unspecified: Secondary | ICD-10-CM | POA: Diagnosis not present

## 2023-02-22 DIAGNOSIS — R278 Other lack of coordination: Secondary | ICD-10-CM | POA: Diagnosis not present

## 2023-02-22 DIAGNOSIS — M79671 Pain in right foot: Secondary | ICD-10-CM | POA: Diagnosis not present

## 2023-02-22 DIAGNOSIS — M79604 Pain in right leg: Secondary | ICD-10-CM | POA: Diagnosis not present

## 2023-02-22 DIAGNOSIS — R208 Other disturbances of skin sensation: Secondary | ICD-10-CM | POA: Diagnosis not present

## 2023-02-22 DIAGNOSIS — R413 Other amnesia: Secondary | ICD-10-CM | POA: Diagnosis not present

## 2023-02-22 DIAGNOSIS — H34231 Retinal artery branch occlusion, right eye: Secondary | ICD-10-CM | POA: Diagnosis not present

## 2023-02-22 DIAGNOSIS — R2681 Unsteadiness on feet: Secondary | ICD-10-CM | POA: Diagnosis not present

## 2023-02-22 DIAGNOSIS — Z8673 Personal history of transient ischemic attack (TIA), and cerebral infarction without residual deficits: Secondary | ICD-10-CM | POA: Diagnosis not present

## 2023-02-22 DIAGNOSIS — M5481 Occipital neuralgia: Secondary | ICD-10-CM | POA: Diagnosis not present

## 2023-02-22 DIAGNOSIS — I890 Lymphedema, not elsewhere classified: Secondary | ICD-10-CM

## 2023-02-22 LAB — IRON AND TIBC
Iron Saturation: 8 % — CL (ref 15–55)
Iron: 27 ug/dL (ref 27–139)
Total Iron Binding Capacity: 328 ug/dL (ref 250–450)
UIBC: 301 ug/dL (ref 118–369)

## 2023-02-22 LAB — FERRITIN: Ferritin: 83 ng/mL (ref 15–150)

## 2023-02-22 NOTE — Progress Notes (Signed)
Please call patient and let her know labs have returned, her CBC showed an improved hemoglobin and hematocrit, but still on low side and her iron remains on lower side of normal even with her supplements.  With her fatigue and other symptoms I recommend we send her to hematology for further recommendations. Is she okay with this referral?  Let me know.  Any questions? Keep being stellar!!  Thank you for allowing me to participate in your care.  I appreciate you. Kindest regards, Perri Lamagna

## 2023-02-22 NOTE — Progress Notes (Signed)
Patient was schedule for bilateral unna wraps on today visit. At this time the patient has no recurrent swelling or open wounds. Patient will be taking out of wraps today and will keep upcoming appointment on 03/27/2023. Patient was recommended to wear compression and elevate above heart level. Patient will contact the office if there are any new concerns or if swelling reoccurs.

## 2023-02-23 ENCOUNTER — Other Ambulatory Visit: Payer: Self-pay | Admitting: Nurse Practitioner

## 2023-02-23 DIAGNOSIS — N184 Chronic kidney disease, stage 4 (severe): Secondary | ICD-10-CM

## 2023-02-23 DIAGNOSIS — R2681 Unsteadiness on feet: Secondary | ICD-10-CM

## 2023-02-26 NOTE — Patient Instructions (Incomplete)
Voltaren gel  Anemia  Anemia is a condition in which there are not enough red blood cells or hemoglobin in the blood. Hemoglobin is a substance in red blood cells that carries oxygen. When you do not have enough red blood cells or hemoglobin (are anemic), your body cannot get enough oxygen, and your organs may not work properly. As a result, you may feel very tired or have other problems. What are the causes? Common causes of anemia include: Excessive bleeding. Anemia can be caused by excessive bleeding inside or outside the body, including bleeding from the intestines or from heavy menstrual periods in females. Poor nutrition. Long-lasting (chronic) kidney, thyroid, and liver disease. Bone marrow disorders, spleen problems, and blood disorders. Cancer and treatments for cancer. Human immunodeficiency virus (HIV) and acquired immunodeficiency syndrome (AIDS). Infections, medicines, and autoimmune disorders that destroy red blood cells. What are the signs or symptoms? Symptoms of this condition include: Minor weakness. Dizziness. Headache, or difficulties concentrating and sleeping. Heartbeats that feel irregular or faster than normal (palpitations). Shortness of breath, especially with exercise. Pale skin, lips, and nails, or cold hands and feet. Upset stomach (indigestion) and nausea. Symptoms may occur suddenly or develop slowly. If your anemia is mild, you may not have symptoms. How is this diagnosed? This condition is diagnosed based on blood tests, your medical history, and a physical exam. In some cases, a test may be needed in which cells are removed from the soft tissue inside of a bone and looked at under a microscope (bone marrow biopsy). Your health care provider may also check your stool (feces) for blood and may do more testing to look for the cause of your bleeding. Other tests may include: Imaging tests, such as a CT scan or MRI. A procedure to see inside your esophagus  and stomach (endoscopy). The esophagus is the part of the body that moves food from your mouth to your stomach. A procedure to see inside your colon and rectum (colonoscopy). How is this treated? Treatment for this condition depends on the cause. If you continue to lose a lot of blood, you may need to be treated at a hospital. Treatment may include: Taking supplements of iron, vitamin B12, or folic acid. Taking a hormone medicine (erythropoietin) that can help to stimulate red blood cell growth. Receiving donated blood through an IV (blood transfusion). This may be needed if you lose a lot of blood. Making changes to your diet. Having surgery to remove your spleen. Follow these instructions at home: Take over-the-counter and prescription medicines only as told by your health care provider. Take supplements only as told by your health care provider. Follow any diet instructions that you were given by your health care provider. Keep all follow-up visits. Your health care provider will want to recheck your blood tests. Contact a health care provider if: You develop new bleeding anywhere in the body. You are very weak. Get help right away if: You are short of breath. You have pain in your abdomen or chest. You are dizzy or feel faint. You have trouble concentrating. You have bloody stools, black stools, or tarry stools. You vomit repeatedly or you vomit up blood. These symptoms may be an emergency. Get help right away. Call 911. Do not wait to see if the symptoms will go away. Do not drive yourself to the hospital. Summary Anemia is a condition in which you do not have enough red blood cells or enough of a substance in your red blood cells  that carries oxygen. Symptoms may occur suddenly or develop slowly. If your anemia is mild, you may not have symptoms. This condition is diagnosed with blood tests, a medical history, and a physical exam. Other tests may be needed. Treatment for this  condition depends on the cause of the anemia. This information is not intended to replace advice given to you by your health care provider. Make sure you discuss any questions you have with your health care provider. Document Revised: 07/26/2021 Document Reviewed: 07/26/2021 Elsevier Patient Education  2024 [employer].

## 2023-02-28 ENCOUNTER — Telehealth: Payer: Self-pay | Admitting: Nurse Practitioner

## 2023-02-28 ENCOUNTER — Ambulatory Visit (INDEPENDENT_AMBULATORY_CARE_PROVIDER_SITE_OTHER): Payer: 59 | Admitting: Nurse Practitioner

## 2023-02-28 ENCOUNTER — Encounter: Payer: Self-pay | Admitting: Nurse Practitioner

## 2023-02-28 VITALS — BP 117/66 | HR 78 | Temp 98.0°F | Ht 61.0 in | Wt 151.0 lb

## 2023-02-28 DIAGNOSIS — G629 Polyneuropathy, unspecified: Secondary | ICD-10-CM

## 2023-02-28 DIAGNOSIS — D631 Anemia in chronic kidney disease: Secondary | ICD-10-CM | POA: Diagnosis not present

## 2023-02-28 DIAGNOSIS — N184 Chronic kidney disease, stage 4 (severe): Secondary | ICD-10-CM

## 2023-02-28 NOTE — Progress Notes (Signed)
BP 117/66   Pulse 78   Temp 98 F (36.7 C) (Oral)   Ht 5\' 1"  (1.549 m)   Wt 151 lb (68.5 kg)   SpO2 97%   BMI 28.53 kg/m    Subjective:    Patient ID: Tracie Fisher, female    DOB: 03-11-1936, 87 y.o.   MRN: [id number]  HPI: Tracie Fisher is a 87 y.o. female  Chief Complaint  Patient presents with   Weakness   Anemia   ANEMIA  Follow-up for anemia today.  History of stroke on 08/04/22.  Recent labs did show some ongoing anemia, has CKD and sees nephrology.  Takes two iron pills daily, ferrous gluconate.  Has been on this for some time. Recently reported some weakness and fatigue. Has had to have iron infusions in past.  Started eating liver pudding yesterday.  Followed by neurology for sensory ataxia, history of stroke, and unsteady gait.  Last in office visit with Dr. Malvin Johns was 06/08/22.  Currently takes Lyrica 75 MG TID (CrCl 37) + recently saw vascular and they are working on getting lymphedema pump + has Unna boots off legs now.   Anemia status: recent levels lower Etiology of anemia: CKD Duration of anemia treatment: long term Compliance with treatment: good compliance Iron supplementation side effects: no Severity of anemia: moderate Fatigue: yes and some weakness Decreased exercise tolerance: no  Dyspnea on exertion: no Palpitations: no Bleeding: no Pica: no   Relevant past medical, surgical, family and social history reviewed and updated as indicated. Interim medical history since our last visit reviewed. Allergies and medications reviewed and updated.  Review of Systems  Constitutional:  Positive for fatigue (sometimes). Negative for activity change, appetite change, diaphoresis and unexpected weight change.  Respiratory:  Negative for cough, chest tightness, shortness of breath and wheezing.   Cardiovascular:  Negative for chest pain, palpitations and leg swelling (Unna on.).  Gastrointestinal: Negative.   Neurological:  Negative for dizziness, tremors,  syncope, facial asymmetry, speech difficulty, weakness, light-headedness, numbness and headaches.  Psychiatric/Behavioral:  Positive for sleep disturbance. Negative for agitation, confusion, self-injury and suicidal ideas. The patient is not nervous/anxious.    Per HPI unless specifically indicated above     Objective:    BP 117/66   Pulse 78   Temp 98 F (36.7 C) (Oral)   Ht 5\' 1"  (1.549 m)   Wt 151 lb (68.5 kg)   SpO2 97%   BMI 28.53 kg/m   Wt Readings from Last 3 Encounters:  02/28/23 151 lb (68.5 kg)  02/21/23 149 lb 12.8 oz (67.9 kg)  02/08/23 154 lb (69.9 kg)    Physical Exam Vitals and nursing note reviewed.  Constitutional:      General: She is awake. She is not in acute distress.    Appearance: She is well-developed and well-groomed. She is not ill-appearing or toxic-appearing.  HENT:     Head: Normocephalic.     Right Ear: Hearing and external ear normal.     Left Ear: Hearing and external ear normal.  Eyes:     General: Lids are normal.        Right eye: No discharge.        Left eye: No discharge.     Conjunctiva/sclera: Conjunctivae normal.     Pupils: Pupils are equal, round, and reactive to light.  Neck:     Thyroid: No thyromegaly.     Vascular: No carotid bruit.  Cardiovascular:     Rate  and Rhythm: Normal rate and regular rhythm.     Heart sounds: Normal heart sounds. No murmur heard.    No gallop.  Pulmonary:     Effort: Pulmonary effort is normal. No accessory muscle usage or respiratory distress.     Breath sounds: Normal breath sounds.  Abdominal:     General: Bowel sounds are normal. There is no distension.     Palpations: Abdomen is soft.     Tenderness: There is no abdominal tenderness.  Musculoskeletal:     Cervical back: Normal range of motion and neck supple.     Right lower leg: 1+ Pitting Edema present.     Left lower leg: 1+ Pitting Edema present.  Lymphadenopathy:     Cervical: No cervical adenopathy.  Skin:    General: Skin is  warm and dry.  Neurological:     Mental Status: She is alert and oriented to person, place, and time.     Cranial Nerves: Cranial nerves 2-12 are intact.     Motor: Motor function is intact.     Coordination: Coordination is intact.     Gait: Gait is intact.     Deep Tendon Reflexes: Reflexes are normal and symmetric.     Reflex Scores:      Brachioradialis reflexes are 2+ on the right side and 2+ on the left side.      Patellar reflexes are 2+ on the right side and 2+ on the left side.    Comments: Oriented to location, date, time, and provider today. Strength BUE and BLE 4/5.  Gait stable with cane in use.  Psychiatric:        Attention and Perception: Attention normal.        Mood and Affect: Mood normal.        Speech: Speech normal.        Behavior: Behavior normal. Behavior is cooperative.        Thought Content: Thought content normal.    Results for orders placed or performed in visit on 02/21/23  Iron Binding Cap (TIBC)([facility])  Result Value Ref Range   Total Iron Binding Capacity 328 250 - 450 ug/dL   UIBC 086 578 - 469 ug/dL   Iron 27 27 - 629 ug/dL   Iron Saturation 8 (LL) 15 - 55 %  Ferritin  Result Value Ref Range   Ferritin 83 15 - 150 ng/mL  CBC  Result Value Ref Range   WBC 6.0 3.4 - 10.8 x10E3/uL   RBC 3.41 (L) 3.77 - 5.28 x10E6/uL   Hemoglobin 10.9 (L) 11.1 - 15.9 g/dL   Hematocrit 52.8 (L) 41.3 - 46.6 %   MCV 98 (H) 79 - 97 fL   MCH 32.0 26.6 - 33.0 pg   MCHC 32.7 31.5 - 35.7 g/dL   RDW 24.4 01.0 - 27.2 %   Platelets 178 150 - 450 x10E3/uL      Assessment & Plan:   Problem List Items Addressed This Visit       Nervous and Auditory   Neuropathy    Chronic, ongoing.  Continues to be followed by neurology.  Will continue this collaboration and medication regimen as ordered by them.  Recent note reviewed.  Provided samples of Nervive to trial for the discomfort.  Her CrCl is 37, would avoid increasing Lyrica.        Other   Anemia due  to stage 4 chronic kidney disease (HCC) - Primary    Chronic, has had to  have IV iron in past.  Currently taking iron BID and level remains on lower side of normal at 27.  She is scheduled to see hematology on Monday which will be beneficial as she is still having fatigue.  Will follow-up with her as scheduled in December.         Follow up plan: Return for as scheduled December 20th.

## 2023-02-28 NOTE — Telephone Encounter (Signed)
Copied from CRM [id number]. Topic: General - Other >> Feb 28, 2023 12:30 PM Turkey B wrote: Reason for CRM: pt called in says a message was left for her with her son,but she doesn't know what its about and I dont see any messages about it. Please cb to discuss

## 2023-02-28 NOTE — Assessment & Plan Note (Signed)
Chronic, ongoing.  Continues to be followed by neurology.  Will continue this collaboration and medication regimen as ordered by them.  Recent note reviewed.  Provided samples of Nervive to trial for the discomfort.  Her CrCl is 37, would avoid increasing Lyrica.

## 2023-02-28 NOTE — Telephone Encounter (Signed)
Patient came in for an appoint and found out who called her

## 2023-02-28 NOTE — Assessment & Plan Note (Signed)
Chronic, has had to have IV iron in past.  Currently taking iron BID and level remains on lower side of normal at 27.  She is scheduled to see hematology on Monday which will be beneficial as she is still having fatigue.  Will follow-up with her as scheduled in December.

## 2023-03-01 ENCOUNTER — Encounter (INDEPENDENT_AMBULATORY_CARE_PROVIDER_SITE_OTHER): Payer: 59

## 2023-03-06 ENCOUNTER — Encounter: Payer: Self-pay | Admitting: Oncology

## 2023-03-06 ENCOUNTER — Inpatient Hospital Stay: Payer: 59 | Attending: Oncology | Admitting: Oncology

## 2023-03-06 ENCOUNTER — Inpatient Hospital Stay: Payer: 59

## 2023-03-06 VITALS — BP 136/74 | HR 72 | Temp 97.3°F | Resp 16 | Ht 61.0 in | Wt 152.8 lb

## 2023-03-06 DIAGNOSIS — Z87891 Personal history of nicotine dependence: Secondary | ICD-10-CM | POA: Insufficient documentation

## 2023-03-06 DIAGNOSIS — Z803 Family history of malignant neoplasm of breast: Secondary | ICD-10-CM | POA: Diagnosis not present

## 2023-03-06 DIAGNOSIS — N189 Chronic kidney disease, unspecified: Secondary | ICD-10-CM | POA: Insufficient documentation

## 2023-03-06 DIAGNOSIS — D509 Iron deficiency anemia, unspecified: Secondary | ICD-10-CM | POA: Diagnosis not present

## 2023-03-06 DIAGNOSIS — Z79899 Other long term (current) drug therapy: Secondary | ICD-10-CM | POA: Insufficient documentation

## 2023-03-06 NOTE — Progress Notes (Unsigned)
[facility]  [phone] [phone]  ID: Tracie Fisher OB: December 13, 1935  MR#: [id number]  AOZ#:[id number]  Patient Care Team: Marjie Skiff, NP as PCP - General (Nurse Practitioner) Dalia Heading, MD as Consulting Physician (Cardiology) Lemar Livings Merrily Pew, MD (General Surgery) Marlowe Sax, RN as Case Manager (General Practice)  CHIEF COMPLAINT: Iron deficiency anemia, anemia secondary to chronic renal insufficiency.  INTERVAL HISTORY: Patient is an 87 year old female who previously has received Venofer infusions greater than 8 years ago.  Recently, she has noted to have declining hemoglobin and decreased iron stores.  She also has chronic renal insufficiency.  She has weakness and fatigue, but otherwise feels well.  She has no neurologic complaints.  She denies any recent fevers or illnesses.  She has a good appetite and denies weight loss.  She has no chest pain, shortness of breath, cough, or hemoptysis.  She denies any nausea, vomiting, constipation, or diarrhea.  She has no melena or hematochezia.  She has no urinary complaints.  Patient offers no further specific complaints today.  REVIEW OF SYSTEMS:   Review of Systems  Constitutional:  Positive for malaise/fatigue. Negative for fever and weight loss.  Respiratory:  Negative for cough, hemoptysis and shortness of breath.   Cardiovascular: Negative.  Negative for chest pain and leg swelling.  Gastrointestinal: Negative.  Negative for abdominal pain, blood in stool and melena.  Genitourinary: Negative.  Negative for hematuria.  Musculoskeletal: Negative.  Negative for back pain.  Skin: Negative.  Negative for rash.  Neurological:  Positive for weakness. Negative for dizziness, focal weakness and headaches.  Psychiatric/Behavioral: Negative.  The patient is not nervous/anxious.     As per HPI. Otherwise, a complete review of systems is negative.  PAST MEDICAL HISTORY: Past Medical  History:  Diagnosis Date   Cancer (HCC)    SKIN   Complication of anesthesia    Depression    Dysrhythmia    GERD (gastroesophageal reflux disease)    Hiatal hernia    HOH (hard of hearing)    Hypertension    Hypothyroidism    PONV (postoperative nausea and vomiting)    Thyroid disease     PAST SURGICAL HISTORY: Past Surgical History:  Procedure Laterality Date   APPENDECTOMY     BACK SURGERY     MULTIPLE/ROD IN BACK   BREAST CYST ASPIRATION Left "years ago"   CATARACT EXTRACTION W/PHACO Left 03/21/2017   Procedure: CATARACT EXTRACTION PHACO AND INTRAOCULAR LENS PLACEMENT (IOC);  Surgeon: Galen Manila, MD;  Location: [facility];  Service: Ophthalmology;  Laterality: Left;  [place] 00:48 AP% 17.8 CDE 8.57 Fluid pack lot # [id number]   CATARACT EXTRACTION W/PHACO Right 04/11/2017   Procedure: CATARACT EXTRACTION PHACO AND INTRAOCULAR LENS PLACEMENT (IOC);  Surgeon: Galen Manila, MD;  Location: [facility];  Service: Ophthalmology;  Laterality: Right;  [place] 01:09.5 AP% 15.8 CDE 11.04 Fluid Pack lot # [id number]   CHOLECYSTECTOMY     COLON SURGERY     BOWEL OBSTRUCTION   COLONOSCOPY WITH PROPOFOL N/A 04/25/2016   Procedure: COLONOSCOPY WITH PROPOFOL;  Surgeon: Wyline Mood, MD;  Location: [facility];  Service: Endoscopy;  Laterality: N/A;   ESOPHAGOGASTRODUODENOSCOPY (EGD) WITH PROPOFOL N/A 04/25/2016   Procedure: ESOPHAGOGASTRODUODENOSCOPY (EGD) WITH PROPOFOL;  Surgeon: Wyline Mood, MD;  Location: [facility];  Service: Endoscopy;  Laterality: N/A;   EXPLORATORY LAPAROTOMY  10/18/2011   blockage small intestines   GIVENS CAPSULE STUDY N/A 05/11/2016   Procedure: GIVENS CAPSULE STUDY;  Surgeon:   [facility]  Telephone:([phone] [phone]  ID: Tracie Fisher OB: December 13, 1935  MR#: [id number]  AOZ#:[id number]  Patient Care Team: Marjie Skiff, NP as PCP - General (Nurse Practitioner) Dalia Heading, MD as Consulting Physician (Cardiology) Lemar Livings Merrily Pew, MD (General Surgery) Marlowe Sax, RN as Case Manager (General Practice)  CHIEF COMPLAINT: Iron deficiency anemia, anemia secondary to chronic renal insufficiency.  INTERVAL HISTORY: Patient is an 87 year old female who previously has received Venofer infusions greater than 8 years ago.  Recently, she has noted to have declining hemoglobin and decreased iron stores.  She also has chronic renal insufficiency.  She has weakness and fatigue, but otherwise feels well.  She has no neurologic complaints.  She denies any recent fevers or illnesses.  She has a good appetite and denies weight loss.  She has no chest pain, shortness of breath, cough, or hemoptysis.  She denies any nausea, vomiting, constipation, or diarrhea.  She has no melena or hematochezia.  She has no urinary complaints.  Patient offers no further specific complaints today.  REVIEW OF SYSTEMS:   Review of Systems  Constitutional:  Positive for malaise/fatigue. Negative for fever and weight loss.  Respiratory:  Negative for cough, hemoptysis and shortness of breath.   Cardiovascular: Negative.  Negative for chest pain and leg swelling.  Gastrointestinal: Negative.  Negative for abdominal pain, blood in stool and melena.  Genitourinary: Negative.  Negative for hematuria.  Musculoskeletal: Negative.  Negative for back pain.  Skin: Negative.  Negative for rash.  Neurological:  Positive for weakness. Negative for dizziness, focal weakness and headaches.  Psychiatric/Behavioral: Negative.  The patient is not nervous/anxious.     As per HPI. Otherwise, a complete review of systems is negative.  PAST MEDICAL HISTORY: Past Medical  History:  Diagnosis Date   Cancer (HCC)    SKIN   Complication of anesthesia    Depression    Dysrhythmia    GERD (gastroesophageal reflux disease)    Hiatal hernia    HOH (hard of hearing)    Hypertension    Hypothyroidism    PONV (postoperative nausea and vomiting)    Thyroid disease     PAST SURGICAL HISTORY: Past Surgical History:  Procedure Laterality Date   APPENDECTOMY     BACK SURGERY     MULTIPLE/ROD IN BACK   BREAST CYST ASPIRATION Left "years ago"   CATARACT EXTRACTION W/PHACO Left 03/21/2017   Procedure: CATARACT EXTRACTION PHACO AND INTRAOCULAR LENS PLACEMENT (IOC);  Surgeon: Galen Manila, MD;  Location: [facility];  Service: Ophthalmology;  Laterality: Left;  [place] 00:48 AP% 17.8 CDE 8.57 Fluid pack lot # [id number]   CATARACT EXTRACTION W/PHACO Right 04/11/2017   Procedure: CATARACT EXTRACTION PHACO AND INTRAOCULAR LENS PLACEMENT (IOC);  Surgeon: Galen Manila, MD;  Location: [facility];  Service: Ophthalmology;  Laterality: Right;  [place] 01:09.5 AP% 15.8 CDE 11.04 Fluid Pack lot # [id number]   CHOLECYSTECTOMY     COLON SURGERY     BOWEL OBSTRUCTION   COLONOSCOPY WITH PROPOFOL N/A 04/25/2016   Procedure: COLONOSCOPY WITH PROPOFOL;  Surgeon: Wyline Mood, MD;  Location: [facility];  Service: Endoscopy;  Laterality: N/A;   ESOPHAGOGASTRODUODENOSCOPY (EGD) WITH PROPOFOL N/A 04/25/2016   Procedure: ESOPHAGOGASTRODUODENOSCOPY (EGD) WITH PROPOFOL;  Surgeon: Wyline Mood, MD;  Location: [facility];  Service: Endoscopy;  Laterality: N/A;   EXPLORATORY LAPAROTOMY  10/18/2011   blockage small intestines   GIVENS CAPSULE STUDY N/A 05/11/2016   Procedure: GIVENS CAPSULE STUDY;  Surgeon:   [facility]  Telephone:(336) [phone] Fax:([phone]  ID: Tracie Fisher OB: December 13, 1935  MR#: [id number]  AOZ#:[id number]  Patient Care Team: Marjie Skiff, NP as PCP - General (Nurse Practitioner) Dalia Heading, MD as Consulting Physician (Cardiology) Lemar Livings Merrily Pew, MD (General Surgery) Marlowe Sax, RN as Case Manager (General Practice)  CHIEF COMPLAINT: Iron deficiency anemia, anemia secondary to chronic renal insufficiency.  INTERVAL HISTORY: Patient is an 87 year old female who previously has received Venofer infusions greater than 8 years ago.  Recently, she has noted to have declining hemoglobin and decreased iron stores.  She also has chronic renal insufficiency.  She has weakness and fatigue, but otherwise feels well.  She has no neurologic complaints.  She denies any recent fevers or illnesses.  She has a good appetite and denies weight loss.  She has no chest pain, shortness of breath, cough, or hemoptysis.  She denies any nausea, vomiting, constipation, or diarrhea.  She has no melena or hematochezia.  She has no urinary complaints.  Patient offers no further specific complaints today.  REVIEW OF SYSTEMS:   Review of Systems  Constitutional:  Positive for malaise/fatigue. Negative for fever and weight loss.  Respiratory:  Negative for cough, hemoptysis and shortness of breath.   Cardiovascular: Negative.  Negative for chest pain and leg swelling.  Gastrointestinal: Negative.  Negative for abdominal pain, blood in stool and melena.  Genitourinary: Negative.  Negative for hematuria.  Musculoskeletal: Negative.  Negative for back pain.  Skin: Negative.  Negative for rash.  Neurological:  Positive for weakness. Negative for dizziness, focal weakness and headaches.  Psychiatric/Behavioral: Negative.  The patient is not nervous/anxious.     As per HPI. Otherwise, a complete review of systems is negative.  PAST MEDICAL HISTORY: Past Medical  History:  Diagnosis Date   Cancer (HCC)    SKIN   Complication of anesthesia    Depression    Dysrhythmia    GERD (gastroesophageal reflux disease)    Hiatal hernia    HOH (hard of hearing)    Hypertension    Hypothyroidism    PONV (postoperative nausea and vomiting)    Thyroid disease     PAST SURGICAL HISTORY: Past Surgical History:  Procedure Laterality Date   APPENDECTOMY     BACK SURGERY     MULTIPLE/ROD IN BACK   BREAST CYST ASPIRATION Left "years ago"   CATARACT EXTRACTION W/PHACO Left 03/21/2017   Procedure: CATARACT EXTRACTION PHACO AND INTRAOCULAR LENS PLACEMENT (IOC);  Surgeon: Galen Manila, MD;  Location: [facility];  Service: Ophthalmology;  Laterality: Left;  [place] 00:48 AP% 17.8 CDE 8.57 Fluid pack lot # [id number]   CATARACT EXTRACTION W/PHACO Right 04/11/2017   Procedure: CATARACT EXTRACTION PHACO AND INTRAOCULAR LENS PLACEMENT (IOC);  Surgeon: Galen Manila, MD;  Location: [facility];  Service: Ophthalmology;  Laterality: Right;  [place] 01:09.5 AP% 15.8 CDE 11.04 Fluid Pack lot # [id number]   CHOLECYSTECTOMY     COLON SURGERY     BOWEL OBSTRUCTION   COLONOSCOPY WITH PROPOFOL N/A 04/25/2016   Procedure: COLONOSCOPY WITH PROPOFOL;  Surgeon: Wyline Mood, MD;  Location: [facility];  Service: Endoscopy;  Laterality: N/A;   ESOPHAGOGASTRODUODENOSCOPY (EGD) WITH PROPOFOL N/A 04/25/2016   Procedure: ESOPHAGOGASTRODUODENOSCOPY (EGD) WITH PROPOFOL;  Surgeon: Wyline Mood, MD;  Location: [facility];  Service: Endoscopy;  Laterality: N/A;   EXPLORATORY LAPAROTOMY  10/18/2011   blockage small intestines   GIVENS CAPSULE STUDY N/A 05/11/2016   Procedure: GIVENS CAPSULE STUDY;  Surgeon:  Skin: No rashes or petechiae noted. Psych: Normal affect. Lymphatics: No cervical, calvicular, axillary or inguinal LAD.   LAB RESULTS:  Lab Results  Component Value Date   NA 142 02/03/2023   K 3.8 02/03/2023   CL 102 02/03/2023   CO2 24 02/03/2023   GLUCOSE 86 02/03/2023   BUN 23 02/03/2023   CREATININE 1.20 (H) 02/03/2023   CALCIUM 9.1 02/03/2023   PROT 5.9 (L) 02/03/2023   ALBUMIN 4.0 02/03/2023   AST 23 02/03/2023   ALT 10 02/03/2023   ALKPHOS 84 02/03/2023   BILITOT 0.2 02/03/2023   GFRNONAA 49 (L) 12/04/2022   GFRAA 52 (L) 10/13/2019    Lab Results  Component Value Date   WBC 6.0 02/21/2023   NEUTROABS 3.5 02/03/2023   HGB 10.9 (L) 02/21/2023   HCT 33.3 (L) 02/21/2023   MCV 98 (H) 02/21/2023   PLT 178 02/21/2023   Lab Results  Component Value Date   IRON  27 02/21/2023   TIBC 328 02/21/2023   IRONPCTSAT 8 (LL) 02/21/2023   Lab Results  Component Value Date   FERRITIN 83 02/21/2023     STUDIES: VAS [place] LOWER EXTREMITY VENOUS REFLUX  Result Date: 02/09/2023  Lower Venous Reflux Study Patient Name:  Tracie Fisher  Date of Exam:   02/08/2023 Medical Rec #: [id number]        Accession #:    [id number] Date of Birth: 07-21-1935         Patient Gender: F Patient Age:   20 years Exam Location:  [facility] Procedure:      VAS [place] LOWER EXTREMITY VENOUS REFLUX Referring Phys: Sheppard Plumber --------------------------------------------------------------------------------  Indications: Swelling, Edema, and HX of AFib.  Performing Technologist: Salvadore Farber RVT  Examination Guidelines: A complete evaluation includes B-mode imaging, spectral Doppler, color Doppler, and power Doppler as needed of all accessible portions of each vessel. Bilateral testing is considered an integral part of a complete examination. Limited examinations for reoccurring indications may be performed as noted. The reflux portion of the exam is performed with the patient in reverse Trendelenburg. Significant venous reflux is defined as >500 ms in the superficial venous system, and >1 second in the deep venous system.   Summary: Bilateral: - No evidence of deep vein thrombosis seen in the lower extremities, bilaterally, from the common femoral through the popliteal veins. - No evidence of superficial venous thrombosis in the lower extremities, bilaterally. - No evidence of deep venous insufficiency seen bilaterally in the lower extremity. - No evidence of superficial venous reflux seen in the greater saphenous veins bilaterally. - No evidence of superficial venous reflux seen in the short saphenous veins bilaterally.  *See table(s) above for measurements and observations. Electronically signed by Festus Barren MD on 02/09/2023 at 1:24:15 PM.    Final     ASSESSMENT: Iron deficiency  anemia.  PLAN:    Iron deficiency anemia: Patient was noted to have a mildly reduced hemoglobin of 10.9 as well as decreased iron saturation of 8%.  Patient will benefit from IV iron and will return to clinic 3 times over the next 2 to 3 weeks to receive 200 mg IV Venofer.  She has been instructed to continue oral iron supplementation.  Patient would then return to clinic in 3 months with repeat laboratory, further evaluation, and consideration of additional treatment if necessary. Chronic renal insufficiency: Chronic and unchanged.  May be contributing to patient's anemia, but she does not require Retacrit at this time.

## 2023-03-07 ENCOUNTER — Ambulatory Visit: Payer: 59 | Admitting: Nurse Practitioner

## 2023-03-07 ENCOUNTER — Encounter: Payer: Self-pay | Admitting: Oncology

## 2023-03-08 ENCOUNTER — Encounter (INDEPENDENT_AMBULATORY_CARE_PROVIDER_SITE_OTHER): Payer: 59

## 2023-03-10 ENCOUNTER — Other Ambulatory Visit: Payer: Self-pay

## 2023-03-10 ENCOUNTER — Other Ambulatory Visit: Payer: Self-pay | Admitting: *Deleted

## 2023-03-10 NOTE — Patient Outreach (Signed)
Care Management   Visit Note  03/10/2023 Name: Tracie Fisher MRN: [id number] DOB: 08-18-1935  Subjective: Tracie Fisher is a 87 y.o. year old female who is a primary care patient of Cannady, Dorie Rank, NP. The Care Management team was consulted for assistance.      Engaged with patient spoke with patient by telephone.    Goals Addressed             This Visit's Progress    RNCM Care Management Expected Outcome:  Monitor, Self-Manage and Reduce Symptoms of: Memory changes       Current Barriers:  Chronic Disease Management support and education needs related to memory changes   Planned Interventions: Evaluation of current treatment plan related to dementia or memory changes and patient's adherence to plan as established by provider. The patient says that she is doing ok today but states that she was supposed to receive a lymphedema machine for her legs to help aid in the swelling. She states that PT has wrapped them twice with some benefit. She states at this time she has 2 blisters, with one being open. She states when this has happened in the past her PCP advised her to use soap and water to keep area clean and dry. She denies any signs or symptoms of infection at site. Advised patient to call the office for changes in memory, increased anxiety or acute changes in mental status. Denies any new concerns with her mentation, stress level or other concerns. Provided education to patient re: safety in the home, falls preventions, support system, and medication safety. Has had more than 2 falls in  the last year. No injuries. Denies any new falls. Reviewed medications with patient and discussed compliance. The patient keeps her medications in their original bottles and she has them set up where she remembers to take them each morning. She denies any issues with making sure she takes her medications as directed. Review of the ability for pharmacy support if needed. The patient denies any needs  at this time related to medication needs. Will continue to monitor.  Reviewed scheduled/upcoming provider appointments including 05-05-2023 with the pcp Discussed plans with patient for ongoing care management follow up and provided patient with direct contact information for care management team Advised patient to discuss changes in her memory, new questions or concerns about her health and well being with provider Screening for signs and symptoms of depression related to chronic disease state  Assessed social determinant of health barriers    Symptom Management: Take medications as prescribed   Attend all scheduled provider appointments Call provider office for new concerns or questions  call the Suicide and Crisis Lifeline: 988 call the [place] National Suicide Prevention Lifeline: 347-190-8193 or TTY: 408 501 8829 TTY (321) 279-3196) to talk to a trained counselor call 1-800-273-TALK (toll free, 24 hour hotline) if experiencing a Mental Health or Behavioral Health Crisis   Follow Up Plan: Telephone follow up appointment with care management team member scheduled for: 05-08-2023 at 0945 am       RNCM Care Management Expected Outcome:  Monitor, Self-Manage, and Reduce Symptoms of Hypertension       Current Barriers:  Chronic Disease Management support and education needs related to effective management of HTN BP Readings from Last 3 Encounters:  03/06/23 136/74  02/28/23 117/66  02/21/23 136/73     Planned Interventions: Evaluation of current treatment plan related to hypertension self management and patient's adherence to plan as established by provider. Denies any  issues with her blood pressures or heart health. Patients states that she randomly checks her blood pressure and on her last check it was good/baseline for her. Provided education to patient re: stroke prevention, s/s of heart attack and stroke. Has had a stroke in the past.  Reviewed prescribed diet heart healthy diet.  States she maintains a heart healthy diet refraining from salty and high fattty foods.  Reviewed medications with patient and discussed importance of compliance. The patient states compliance with medications.;  Discussed plans with patient for ongoing care management follow up and provided patient with direct contact information for care management team; Advised patient, providing education and rationale, to monitor blood pressure daily and record, calling PCP for findings outside established parameters;  Reviewed scheduled/upcoming provider appointments including:05-05-2023 Advised patient to discuss changes in HTN or heart health with provider; Provided education on prescribed diet heart healthy diet;  Discussed complications of poorly controlled blood pressure such as heart disease, stroke, circulatory complications, vision complications, kidney impairment, sexual dysfunction;  Screening for signs and symptoms of depression related to chronic disease state;  Assessed social determinant of health barriers;  Patient expressed concerns related to having to start iron infusions on Monday and states that the last time she was directly admitted from the clinic and also has a fear of needles. She continues to express that her 3 yo sister has been driving her to her appointments because her son is working 12hr shifts and it is difficult for him to take the time off. RN CM asked if she would like a referral to the care guides for transportation needs and she declined at this time stating that she will allow her sister to continue driving her but she would rather her not due to her being older than she is. She advised that if something changes, she would notify RNCM and have referral placed for transportation purposes.   Symptom Management: Take medications as prescribed   Attend all scheduled provider appointments Call provider office for new concerns or questions  call the Suicide and Crisis Lifeline:  988 call the [place] National Suicide Prevention Lifeline: (514)511-4889 or TTY: 2528730113 TTY 907-320-3748) to talk to a trained counselor call 1-800-273-TALK (toll free, 24 hour hotline) if experiencing a Mental Health or Behavioral Health Crisis  check blood pressure weekly learn about high blood pressure call doctor for signs and symptoms of high blood pressure develop an action plan for high blood pressure keep all doctor appointments take medications for blood pressure exactly as prescribed report new symptoms to your doctor  Follow Up Plan: Telephone follow up appointment with care management team member scheduled for: 05-08-2023 at 0945 am       RNCM Care Management: Maintain, Monitor and Self-Manage Symptoms of COPD       Current Barriers:  Chronic Disease Management support and education needs related to effective management of COPD  Planned Interventions: Provided patient with basic written and verbal COPD education on self care/management/and exacerbation prevention. Feels she is stable.  Advised patient to track and manage COPD triggers. Review of triggers that may exacerbate her conditions. She paces her activity and rest when she needs to do so. She states on 03-09-2023 she noticed that her heart rate was abnormal and she states that this has been going on for some time but she is following up with Cardiology soon. Provided written and verbal instructions on pursed lip breathing and utilized returned demonstration as teach back Provided instruction about proper use of medications  used for management of COPD including inhalers Advised patient to self assesses COPD action plan zone and make appointment with provider if in the yellow zone for 48 hours without improvement Provided education about and advised patient to utilize infection prevention strategies to reduce risk of respiratory infection Discussed the importance of adequate rest and management of fatigue with  COPD Screening for signs and symptoms of depression related to chronic disease state  Assessed social determinant of health barriers Review of weather changes and monitoring for acute changes in her breathing. She will see the pcp on 05-05-2023 for follow up.   Symptom Management: Take medications as prescribed   Attend all scheduled provider appointments Call provider office for new concerns or questions  call the Suicide and Crisis Lifeline: 988 call the [place] National Suicide Prevention Lifeline: 6011694678 or TTY: 5172228608 TTY 320-409-1769) to talk to a trained counselor call 1-800-273-TALK (toll free, 24 hour hotline) if experiencing a Mental Health or Behavioral Health Crisis  avoid second hand smoke eliminate smoking in my home identify and remove indoor air pollutants limit outdoor activity during cold weather listen for public air quality announcements every day do breathing exercises every day develop a rescue plan follow rescue plan if symptoms flare-up  Follow Up Plan: Telephone follow up appointment with care management team member scheduled for: 05-08-2023 at 0945 am           Consent to Services:  Patient was given information about care management services, agreed to services, and gave verbal consent to participate.   Plan: Telephone follow up appointment with care management team member scheduled for: 05-08-2023 at 9:45 am  Danise Edge, BSN RN RN Care Manager  [facility]  Ambulatory Care Management  Direct Number: [phone]

## 2023-03-10 NOTE — Patient Instructions (Signed)
Visit Information  Thank you for taking time to visit with me today. Please don't hesitate to contact me if I can be of assistance to you before our next scheduled telephone appointment.  Following are the goals we discussed today:   Goals Addressed             This Visit's Progress    RNCM Care Management Expected Outcome:  Monitor, Self-Manage and Reduce Symptoms of: Memory changes       Current Barriers:  Chronic Disease Management support and education needs related to memory changes   Planned Interventions: Evaluation of current treatment plan related to dementia or memory changes and patient's adherence to plan as established by provider. The patient says that she is doing ok today but states that she was supposed to receive a lymphedema machine for her legs to help aid in the swelling. She states that PT has wrapped them twice with some benefit. She states at this time she has 2 blisters, with one being open. She states when this has happened in the past her PCP advised her to use soap and water to keep area clean and dry. She denies any signs or symptoms of infection at site. Advised patient to call the office for changes in memory, increased anxiety or acute changes in mental status. Denies any new concerns with her mentation, stress level or other concerns. Provided education to patient re: safety in the home, falls preventions, support system, and medication safety. Has had more than 2 falls in  the last year. No injuries. Denies any new falls. Reviewed medications with patient and discussed compliance. The patient keeps her medications in their original bottles and she has them set up where she remembers to take them each morning. She denies any issues with making sure she takes her medications as directed. Review of the ability for pharmacy support if needed. The patient denies any needs at this time related to medication needs. Will continue to monitor.  Reviewed scheduled/upcoming  provider appointments including 05-05-2023 with the pcp Discussed plans with patient for ongoing care management follow up and provided patient with direct contact information for care management team Advised patient to discuss changes in her memory, new questions or concerns about her health and well being with provider Screening for signs and symptoms of depression related to chronic disease state  Assessed social determinant of health barriers    Symptom Management: Take medications as prescribed   Attend all scheduled provider appointments Call provider office for new concerns or questions  call the Suicide and Crisis Lifeline: 988 call the [place] National Suicide Prevention Lifeline: 647-364-9412 or TTY: 647 651 5812 TTY 870-338-1390) to talk to a trained counselor call 1-800-273-TALK (toll free, 24 hour hotline) if experiencing a Mental Health or Behavioral Health Crisis   Follow Up Plan: Telephone follow up appointment with care management team member scheduled for: 05-08-2023 at 0945 am       RNCM Care Management Expected Outcome:  Monitor, Self-Manage, and Reduce Symptoms of Hypertension       Current Barriers:  Chronic Disease Management support and education needs related to effective management of HTN BP Readings from Last 3 Encounters:  03/06/23 136/74  02/28/23 117/66  02/21/23 136/73     Planned Interventions: Evaluation of current treatment plan related to hypertension self management and patient's adherence to plan as established by provider. Denies any issues with her blood pressures or heart health. Patients states that she randomly checks her blood pressure and on her  last check it was good/baseline for her. Provided education to patient re: stroke prevention, s/s of heart attack and stroke. Has had a stroke in the past.  Reviewed prescribed diet heart healthy diet. States she maintains a heart healthy diet refraining from salty and high fattty foods.  Reviewed  medications with patient and discussed importance of compliance. The patient states compliance with medications.;  Discussed plans with patient for ongoing care management follow up and provided patient with direct contact information for care management team; Advised patient, providing education and rationale, to monitor blood pressure daily and record, calling PCP for findings outside established parameters;  Reviewed scheduled/upcoming provider appointments including:05-05-2023 Advised patient to discuss changes in HTN or heart health with provider; Provided education on prescribed diet heart healthy diet;  Discussed complications of poorly controlled blood pressure such as heart disease, stroke, circulatory complications, vision complications, kidney impairment, sexual dysfunction;  Screening for signs and symptoms of depression related to chronic disease state;  Assessed social determinant of health barriers;  Patient expressed concerns related to having to start iron infusions on Monday and states that the last time she was directly admitted from the clinic and also has a fear of needles. She continues to express that her 10 yo sister has been driving her to her appointments because her son is working 12hr shifts and it is difficult for him to take the time off. RN CM asked if she would like a referral to the care guides for transportation needs and she declined at this time stating that she will allow her sister to continue driving her but she would rather her not due to her being older than she is. She advised that if something changes, she would notify RNCM and have referral placed for transportation purposes.   Symptom Management: Take medications as prescribed   Attend all scheduled provider appointments Call provider office for new concerns or questions  call the Suicide and Crisis Lifeline: 988 call the [place] National Suicide Prevention Lifeline: 352 826 4221 or TTY: (386)669-3396 TTY  (207)508-6314) to talk to a trained counselor call 1-800-273-TALK (toll free, 24 hour hotline) if experiencing a Mental Health or Behavioral Health Crisis  check blood pressure weekly learn about high blood pressure call doctor for signs and symptoms of high blood pressure develop an action plan for high blood pressure keep all doctor appointments take medications for blood pressure exactly as prescribed report new symptoms to your doctor  Follow Up Plan: Telephone follow up appointment with care management team member scheduled for: 05-08-2023 at 0945 am       RNCM Care Management: Maintain, Monitor and Self-Manage Symptoms of COPD       Current Barriers:  Chronic Disease Management support and education needs related to effective management of COPD  Planned Interventions: Provided patient with basic written and verbal COPD education on self care/management/and exacerbation prevention. Feels she is stable.  Advised patient to track and manage COPD triggers. Review of triggers that may exacerbate her conditions. She paces her activity and rest when she needs to do so. She states on 03-09-2023 she noticed that her heart rate was abnormal and she states that this has been going on for some time but she is following up with Cardiology soon. Provided written and verbal instructions on pursed lip breathing and utilized returned demonstration as teach back Provided instruction about proper use of medications used for management of COPD including inhalers Advised patient to self assesses COPD action plan zone and make appointment with  provider if in the yellow zone for 48 hours without improvement Provided education about and advised patient to utilize infection prevention strategies to reduce risk of respiratory infection Discussed the importance of adequate rest and management of fatigue with COPD Screening for signs and symptoms of depression related to chronic disease state  Assessed social  determinant of health barriers Review of weather changes and monitoring for acute changes in her breathing. She will see the pcp on 05-05-2023 for follow up.   Symptom Management: Take medications as prescribed   Attend all scheduled provider appointments Call provider office for new concerns or questions  call the Suicide and Crisis Lifeline: 988 call the [place] National Suicide Prevention Lifeline: 5646441309 or TTY: 709 777 6462 TTY (872) 222-4917) to talk to a trained counselor call 1-800-273-TALK (toll free, 24 hour hotline) if experiencing a Mental Health or Behavioral Health Crisis  avoid second hand smoke eliminate smoking in my home identify and remove indoor air pollutants limit outdoor activity during cold weather listen for public air quality announcements every day do breathing exercises every day develop a rescue plan follow rescue plan if symptoms flare-up  Follow Up Plan: Telephone follow up appointment with care management team member scheduled for: 05-08-2023 at 0945 am           Our next appointment is by telephone on 05-08-2023 at 9:45 am  Please call the care guide team at [phone] if you need to cancel or reschedule your appointment.   If you are experiencing a Mental Health or Behavioral Health Crisis or need someone to talk to, please call the Suicide and Crisis Lifeline: 988 call the [place] National Suicide Prevention Lifeline: (902)452-2738 or TTY: 724-674-2915 TTY 7755537242) to talk to a trained counselor call 1-800-273-TALK (toll free, 24 hour hotline) call 911   The patient verbalized understanding of instructions, educational materials, and care plan provided today and DECLINED offer to receive copy of patient instructions, educational materials, and care plan.   Telephone follow up appointment with care management team member scheduled for: 05-08-2023 at 9:45 am  Danise Edge, BSN RN RN Care Manager  [facility]  Ambulatory Care  Management  Direct Number: [phone]

## 2023-03-13 ENCOUNTER — Inpatient Hospital Stay: Payer: 59

## 2023-03-16 ENCOUNTER — Inpatient Hospital Stay: Payer: 59

## 2023-03-16 VITALS — BP 133/71 | HR 75 | Resp 16

## 2023-03-16 DIAGNOSIS — N189 Chronic kidney disease, unspecified: Secondary | ICD-10-CM | POA: Diagnosis not present

## 2023-03-16 DIAGNOSIS — Z87891 Personal history of nicotine dependence: Secondary | ICD-10-CM | POA: Diagnosis not present

## 2023-03-16 DIAGNOSIS — Z803 Family history of malignant neoplasm of breast: Secondary | ICD-10-CM | POA: Diagnosis not present

## 2023-03-16 DIAGNOSIS — D509 Iron deficiency anemia, unspecified: Secondary | ICD-10-CM | POA: Diagnosis not present

## 2023-03-16 DIAGNOSIS — Z79899 Other long term (current) drug therapy: Secondary | ICD-10-CM | POA: Diagnosis not present

## 2023-03-16 DIAGNOSIS — D500 Iron deficiency anemia secondary to blood loss (chronic): Secondary | ICD-10-CM

## 2023-03-16 MED ORDER — IRON SUCROSE 20 MG/ML IV SOLN
200.0000 mg | Freq: Once | INTRAVENOUS | Status: AC
  Administered 2023-03-16: 200 mg via INTRAVENOUS
  Filled 2023-03-16: qty 10

## 2023-03-17 DIAGNOSIS — H34231 Retinal artery branch occlusion, right eye: Secondary | ICD-10-CM | POA: Diagnosis not present

## 2023-03-17 DIAGNOSIS — D485 Neoplasm of uncertain behavior of skin: Secondary | ICD-10-CM | POA: Diagnosis not present

## 2023-03-17 DIAGNOSIS — Z961 Presence of intraocular lens: Secondary | ICD-10-CM | POA: Diagnosis not present

## 2023-03-17 DIAGNOSIS — H47011 Ischemic optic neuropathy, right eye: Secondary | ICD-10-CM | POA: Diagnosis not present

## 2023-03-20 ENCOUNTER — Inpatient Hospital Stay: Payer: 59 | Attending: Oncology

## 2023-03-20 VITALS — BP 126/66 | HR 84 | Temp 97.0°F | Resp 18

## 2023-03-20 DIAGNOSIS — D509 Iron deficiency anemia, unspecified: Secondary | ICD-10-CM | POA: Insufficient documentation

## 2023-03-20 DIAGNOSIS — D500 Iron deficiency anemia secondary to blood loss (chronic): Secondary | ICD-10-CM

## 2023-03-20 MED ORDER — IRON SUCROSE 20 MG/ML IV SOLN
200.0000 mg | Freq: Once | INTRAVENOUS | Status: AC
  Administered 2023-03-20: 200 mg via INTRAVENOUS

## 2023-03-20 NOTE — Patient Instructions (Signed)
Iron Sucrose Injection What is this medication? IRON SUCROSE (EYE ern SOO krose) treats low levels of iron (iron deficiency anemia) in people with kidney disease. Iron is a mineral that plays an important role in making red blood cells, which carry oxygen from your lungs to the rest of your body. This medicine may be used for other purposes; ask your health care provider or pharmacist if you have questions. COMMON BRAND NAME(S): Venofer What should I tell my care team before I take this medication? They need to know if you have any of these conditions: Anemia not caused by low iron levels Heart disease High levels of iron in the blood Kidney disease Liver disease An unusual or allergic reaction to iron, other medications, foods, dyes, or preservatives Pregnant or trying to get pregnant Breastfeeding How should I use this medication? This medication is for infusion into a vein. It is given in a hospital or clinic setting. Talk to your care team about the use of this medication in children. While this medication may be prescribed for children as young as 2 years for selected conditions, precautions do apply. Overdosage: If you think you have taken too much of this medicine contact a poison control center or emergency room at once. NOTE: This medicine is only for you. Do not share this medicine with others. What if I miss a dose? Keep appointments for follow-up doses. It is important not to miss your dose. Call your care team if you are unable to keep an appointment. What may interact with this medication? Do not take this medication with any of the following: Deferoxamine Dimercaprol Other iron products This medication may also interact with the following: Chloramphenicol Deferasirox This list may not describe all possible interactions. Give your health care provider a list of all the medicines, herbs, non-prescription drugs, or dietary supplements you use. Also tell them if you smoke,  drink alcohol, or use illegal drugs. Some items may interact with your medicine. What should I watch for while using this medication? Visit your care team regularly. Tell your care team if your symptoms do not start to get better or if they get worse. You may need blood work done while you are taking this medication. You may need to follow a special diet. Talk to your care team. Foods that contain iron include: whole grains/cereals, dried fruits, beans, or peas, leafy green vegetables, and organ meats (liver, kidney). What side effects may I notice from receiving this medication? Side effects that you should report to your care team as soon as possible: Allergic reactions--skin rash, itching, hives, swelling of the face, lips, tongue, or throat Low blood pressure--dizziness, feeling faint or lightheaded, blurry vision Shortness of breath Side effects that usually do not require medical attention (report to your care team if they continue or are bothersome): Flushing Headache Joint pain Muscle pain Nausea Pain, redness, or irritation at injection site This list may not describe all possible side effects. Call your doctor for medical advice about side effects. You may report side effects to FDA at 1-800-FDA-1088. Where should I keep my medication? This medication is given in a hospital or clinic. It will not be stored at home. NOTE: This sheet is a summary. It may not cover all possible information. If you have questions about this medicine, talk to your doctor, pharmacist, or health care provider.  2024 Elsevier/Gold Standard (2022-10-07 00:00:00)

## 2023-03-22 DIAGNOSIS — E875 Hyperkalemia: Secondary | ICD-10-CM | POA: Diagnosis not present

## 2023-03-22 DIAGNOSIS — I129 Hypertensive chronic kidney disease with stage 1 through stage 4 chronic kidney disease, or unspecified chronic kidney disease: Secondary | ICD-10-CM | POA: Diagnosis not present

## 2023-03-22 DIAGNOSIS — R600 Localized edema: Secondary | ICD-10-CM | POA: Diagnosis not present

## 2023-03-22 DIAGNOSIS — N1832 Chronic kidney disease, stage 3b: Secondary | ICD-10-CM | POA: Diagnosis not present

## 2023-03-23 ENCOUNTER — Inpatient Hospital Stay: Payer: 59

## 2023-03-23 VITALS — BP 126/62 | HR 85 | Temp 98.6°F | Resp 16

## 2023-03-23 DIAGNOSIS — Z9181 History of falling: Secondary | ICD-10-CM | POA: Diagnosis not present

## 2023-03-23 DIAGNOSIS — D500 Iron deficiency anemia secondary to blood loss (chronic): Secondary | ICD-10-CM

## 2023-03-23 DIAGNOSIS — I10 Essential (primary) hypertension: Secondary | ICD-10-CM | POA: Diagnosis not present

## 2023-03-23 DIAGNOSIS — D509 Iron deficiency anemia, unspecified: Secondary | ICD-10-CM | POA: Diagnosis not present

## 2023-03-23 DIAGNOSIS — R2689 Other abnormalities of gait and mobility: Secondary | ICD-10-CM | POA: Diagnosis not present

## 2023-03-23 DIAGNOSIS — R262 Difficulty in walking, not elsewhere classified: Secondary | ICD-10-CM | POA: Diagnosis not present

## 2023-03-23 DIAGNOSIS — M6281 Muscle weakness (generalized): Secondary | ICD-10-CM | POA: Diagnosis not present

## 2023-03-23 MED ORDER — IRON SUCROSE 20 MG/ML IV SOLN
200.0000 mg | Freq: Once | INTRAVENOUS | Status: AC
Start: 2023-03-23 — End: 2023-03-23
  Administered 2023-03-23: 200 mg via INTRAVENOUS
  Filled 2023-03-23: qty 10

## 2023-03-24 ENCOUNTER — Telehealth (INDEPENDENT_AMBULATORY_CARE_PROVIDER_SITE_OTHER): Payer: Self-pay | Admitting: Vascular Surgery

## 2023-03-24 NOTE — Telephone Encounter (Signed)
Per Dr. Gilda Crease, patient does NOT need the ultrasound scheduled for 11.11.24. I LVM for pt TCB and let us know if she received message and that she would only need the appt with Dr. Gilda Crease.

## 2023-03-27 ENCOUNTER — Ambulatory Visit (INDEPENDENT_AMBULATORY_CARE_PROVIDER_SITE_OTHER): Payer: 59 | Admitting: Vascular Surgery

## 2023-03-27 ENCOUNTER — Encounter (INDEPENDENT_AMBULATORY_CARE_PROVIDER_SITE_OTHER): Payer: 59

## 2023-03-27 ENCOUNTER — Encounter (INDEPENDENT_AMBULATORY_CARE_PROVIDER_SITE_OTHER): Payer: Self-pay | Admitting: Vascular Surgery

## 2023-03-27 VITALS — BP 122/69 | HR 76 | Resp 16

## 2023-03-27 DIAGNOSIS — M150 Primary generalized (osteo)arthritis: Secondary | ICD-10-CM

## 2023-03-27 DIAGNOSIS — I890 Lymphedema, not elsewhere classified: Secondary | ICD-10-CM

## 2023-03-27 DIAGNOSIS — I10 Essential (primary) hypertension: Secondary | ICD-10-CM | POA: Diagnosis not present

## 2023-03-27 DIAGNOSIS — E782 Mixed hyperlipidemia: Secondary | ICD-10-CM

## 2023-03-27 NOTE — Progress Notes (Unsigned)
MRN : [id number]  Tracie Fisher is a 87 y.o. (June 30, 1935) female who presents with chief complaint of legs swell.  History of Present Illness:  The patient returns to the office for followup evaluation regarding leg swelling.  The swelling has persisted and the pain associated with swelling continues. There have not been any interval development of a ulcerations or wounds.  Since the previous visit the patient has been wearing graduated compression stockings and has noted little if any improvement in the lymphedema. The patient has been using compression routinely morning until night.  The patient also states elevation during the day and exercise is being done too.  No outpatient medications have been marked as taking for the 03/27/23 encounter (Appointment) with Gilda Crease, Latina Craver, MD.    Past Medical History:  Diagnosis Date   Cancer [facility])    SKIN   Complication of anesthesia    Depression    Dysrhythmia    GERD (gastroesophageal reflux disease)    Hiatal hernia    HOH (hard of hearing)    Hypertension    Hypothyroidism    PONV (postoperative nausea and vomiting)    Thyroid disease     Past Surgical History:  Procedure Laterality Date   APPENDECTOMY     BACK SURGERY     MULTIPLE/ROD IN BACK   BREAST CYST ASPIRATION Left "years ago"   CATARACT EXTRACTION W/PHACO Left 03/21/2017   Procedure: CATARACT EXTRACTION PHACO AND INTRAOCULAR LENS PLACEMENT (IOC);  Surgeon: Galen Manila, MD;  Location: [facility] ORS;  Service: Ophthalmology;  Laterality: Left;  [place] 00:48 AP% 17.8 CDE 8.57 Fluid pack lot # [id number]   CATARACT EXTRACTION W/PHACO Right 04/11/2017   Procedure: CATARACT EXTRACTION PHACO AND INTRAOCULAR LENS PLACEMENT (IOC);  Surgeon: Galen Manila, MD;  Location: [facility] ORS;  Service: Ophthalmology;  Laterality: Right;  [place] 01:09.5 AP% 15.8 CDE 11.04 Fluid Pack lot # [id number]   CHOLECYSTECTOMY     COLON SURGERY     BOWEL OBSTRUCTION    COLONOSCOPY WITH PROPOFOL N/A 04/25/2016   Procedure: COLONOSCOPY WITH PROPOFOL;  Surgeon: Wyline Mood, MD;  Location: [facility] ENDOSCOPY;  Service: Endoscopy;  Laterality: N/A;   ESOPHAGOGASTRODUODENOSCOPY (EGD) WITH PROPOFOL N/A 04/25/2016   Procedure: ESOPHAGOGASTRODUODENOSCOPY (EGD) WITH PROPOFOL;  Surgeon: Wyline Mood, MD;  Location: [facility] ENDOSCOPY;  Service: Endoscopy;  Laterality: N/A;   EXPLORATORY LAPAROTOMY  10/18/2011   blockage small intestines   GIVENS CAPSULE STUDY N/A 05/11/2016   Procedure: GIVENS CAPSULE STUDY;  Surgeon: Wyline Mood, MD;  Location: [facility] ENDOSCOPY;  Service: Endoscopy;  Laterality: N/A;    Social History Social History   Tobacco Use   Smoking status: Former    Types: Cigarettes   Smokeless tobacco: Never  Vaping Use   Vaping status: Never Used  Substance Use Topics   Alcohol use: Yes    Comment: 1 beer /day or 2 occ   Drug use: No    Family History Family History  Problem Relation Age of Onset   Breast cancer Sister 49   Heart attack Brother    Heart disease Father     Allergies  Allergen Reactions   Aspirin Itching   Penicillins Other (See Comments)    Unknown reaction Has patient had a PCN reaction causing immediate rash, facial/tongue/throat swelling, SOB or lightheadedness with hypotension: Unknown Has patient had a  PCN reaction causing severe rash involving mucus membranes or skin necrosis: Unknown Has patient had a PCN reaction that required hospitalization: Unknown Has patient had a PCN reaction occurring within the last 10 years: No If all of the above answers are "NO", then may proceed with Cephalosporin use. Patient does not remember the reaction to penicillin.   Penicillin G     Unknown reaction Has patient had a PCN reaction causing immediate rash, facial/tongue/throat swelling, SOB or lightheadedness with hypotension: Unknown Has patient had a PCN reaction causing severe rash involving mucus membranes or skin necrosis:  Unknown Has patient had a PCN reaction that required hospitalization: Unknown Has patient had a PCN reaction occurring within the last 10 years: No If all of the above answers are "NO", then may proceed with Cephalosporin use.      REVIEW OF SYSTEMS (Negative unless checked)  Constitutional: [] Weight loss  [] Fever  [] Chills Cardiac: [] Chest pain   [] Chest pressure   [] Palpitations   [] Shortness of breath when laying flat   [] Shortness of breath with exertion. Vascular:  [] Pain in legs with walking   [x] Pain in legs with standing  [] History of DVT   [] Phlebitis   [x] Swelling in legs   [] Varicose veins   [] Non-healing ulcers Pulmonary:   [] Uses home oxygen   [] Productive cough   [] Hemoptysis   [] Wheeze  [] COPD   [] Asthma Neurologic:  [] Dizziness   [] Seizures   [] History of stroke   [] History of TIA  [] Aphasia   [] Vissual changes   [] Weakness or numbness in arm   [] Weakness or numbness in leg Musculoskeletal:   [] Joint swelling   [] Joint pain   [] Low back pain Hematologic:  [] Easy bruising  [] Easy bleeding   [] Hypercoagulable state   [] Anemic Gastrointestinal:  [] Diarrhea   [] Vomiting  [] Gastroesophageal reflux/heartburn   [] Difficulty swallowing. Genitourinary:  [] Chronic kidney disease   [] Difficult urination  [] Frequent urination   [] Blood in urine Skin:  [] Rashes   [] Ulcers  Psychological:  [] History of anxiety   []  History of major depression.  Physical Examination  There were no vitals filed for this visit. There is no height or weight on file to calculate BMI. Gen: WD/WN, NAD Head: [place]/AT, No temporalis wasting.  Ear/Nose/Throat: Hearing grossly intact, nares w/o erythema or drainage, pinna without lesions Eyes: PER, EOMI, sclera nonicteric.  Neck: Supple, no gross masses.  No JVD.  Pulmonary:  Good air movement, no audible wheezing, no use of accessory muscles.  Cardiac: RRR, precordium not hyperdynamic. Vascular:  scattered varicosities present bilaterally.  Mild venous stasis  changes to the legs bilaterally.  3-4+ soft pitting edema, CEAP C4sEpAsPr  Vessel Right Left  Radial Palpable Palpable  Gastrointestinal: soft, non-distended. No guarding/no peritoneal signs.  Musculoskeletal: M/S 5/5 throughout.  No deformity.  Neurologic: CN 2-12 intact. Pain and light touch intact in extremities.  Symmetrical.  Speech is fluent. Motor exam as listed above. Psychiatric: Judgment intact, Mood & affect appropriate for pt's clinical situation. Dermatologic: Venous rashes no ulcers noted.  No changes consistent with cellulitis. Lymph : No lichenification or skin changes of chronic lymphedema.  CBC Lab Results  Component Value Date   WBC 6.0 02/21/2023   HGB 10.9 (L) 02/21/2023   HCT 33.3 (L) 02/21/2023   MCV 98 (H) 02/21/2023   PLT 178 02/21/2023    BMET    Component Value Date/Time   NA 142 02/03/2023 1405   NA 136 02/13/2013 2024   K 3.8 02/03/2023 1405   K 4.0 02/13/2013  2024   CL 102 02/03/2023 1405   CL 103 02/13/2013 2024   CO2 24 02/03/2023 1405   CO2 22 02/13/2013 2024   GLUCOSE 86 02/03/2023 1405   GLUCOSE 91 12/04/2022 0351   GLUCOSE 93 02/13/2013 2024   BUN 23 02/03/2023 1405   BUN 14 02/13/2013 2024   CREATININE 1.20 (H) 02/03/2023 1405   CREATININE 1.09 02/13/2013 2024   CALCIUM 9.1 02/03/2023 1405   CALCIUM 9.8 02/13/2013 2024   GFRNONAA 49 (L) 12/04/2022 0351   GFRNONAA 50 (L) 02/13/2013 2024   GFRAA 52 (L) 10/13/2019 1945   GFRAA 58 (L) 02/13/2013 2024   CrCl cannot be calculated (Patient's most recent lab result is older than the maximum 21 days allowed.).  COAG Lab Results  Component Value Date   INR 1.0 08/04/2022   INR 0.9 10/15/2011    Radiology No results found.   Assessment/Plan 1. Lymphedema Recommend:  No surgery or intervention at this point in time.   The Patient is CEAP C4sEpAsPr.  The patient has been wearing compression for more than 12 weeks with no or little benefit.  The patient has been exercising daily  for more than 12 weeks. The patient has been elevating and taking OTC pain medications for more than 12 weeks.  None of these have have eliminated the pain related to the lymphedema or the discomfort regarding excessive swelling and venous congestion.    I have reviewed my discussion with the patient regarding lymphedema and why it  causes symptoms.  Patient will continue wearing graduated compression on a daily basis. The patient should put the compression on first thing in the morning and removing them in the evening. The patient should not sleep in the compression.   In addition, behavioral modification throughout the day will be continued.  This will include frequent elevation (such as in a recliner), use of over the counter pain medications as needed and exercise such as walking.  The systemic causes for chronic edema such as liver, kidney and cardiac etiologies do not appear to have significant changed over the past year.    The patient has chronic , severe lymphedema with hyperpigmentation of the skin and has done MLD, skin care, medication, diet, exercise, elevation and compression for 4 weeks with no improvement,  I am recommending a lymphedema pump.  The patient still has stage 3 lymphedema and therefore, I believe that a lymph pump is needed to improve the control of the patient's lymphedema and improve the quality of life.  Additionally, a lymph pump is warranted because it will reduce the risk of cellulitis and ulceration in the future.  Patient should follow-up in six months   2. Essential hypertension Continue antihypertensive medications as already ordered, these medications have been reviewed and there are no changes at this time.  3. Primary osteoarthritis involving multiple joints Continue medications to treat the patient's degenerative disease as already ordered, these medications have been reviewed and there are no changes at this time.  Continued activity and therapy was  stressed.  4. Mixed hyperlipidemia Continue statin as ordered and reviewed, no changes at this time    Levora Dredge, MD  03/27/2023 2:44 PM

## 2023-03-29 ENCOUNTER — Encounter (INDEPENDENT_AMBULATORY_CARE_PROVIDER_SITE_OTHER): Payer: Self-pay | Admitting: Vascular Surgery

## 2023-03-29 DIAGNOSIS — I129 Hypertensive chronic kidney disease with stage 1 through stage 4 chronic kidney disease, or unspecified chronic kidney disease: Secondary | ICD-10-CM | POA: Diagnosis not present

## 2023-03-29 DIAGNOSIS — N1832 Chronic kidney disease, stage 3b: Secondary | ICD-10-CM | POA: Diagnosis not present

## 2023-03-29 DIAGNOSIS — E875 Hyperkalemia: Secondary | ICD-10-CM | POA: Diagnosis not present

## 2023-03-29 DIAGNOSIS — I890 Lymphedema, not elsewhere classified: Secondary | ICD-10-CM | POA: Insufficient documentation

## 2023-03-29 DIAGNOSIS — R600 Localized edema: Secondary | ICD-10-CM | POA: Diagnosis not present

## 2023-04-03 DIAGNOSIS — D485 Neoplasm of uncertain behavior of skin: Secondary | ICD-10-CM | POA: Diagnosis not present

## 2023-04-21 ENCOUNTER — Encounter: Payer: Self-pay | Admitting: Oncology

## 2023-04-22 DIAGNOSIS — M6281 Muscle weakness (generalized): Secondary | ICD-10-CM | POA: Diagnosis not present

## 2023-04-22 DIAGNOSIS — Z9181 History of falling: Secondary | ICD-10-CM | POA: Diagnosis not present

## 2023-04-22 DIAGNOSIS — R262 Difficulty in walking, not elsewhere classified: Secondary | ICD-10-CM | POA: Diagnosis not present

## 2023-04-22 DIAGNOSIS — R2689 Other abnormalities of gait and mobility: Secondary | ICD-10-CM | POA: Diagnosis not present

## 2023-04-22 DIAGNOSIS — I10 Essential (primary) hypertension: Secondary | ICD-10-CM | POA: Diagnosis not present

## 2023-04-28 ENCOUNTER — Encounter: Payer: Self-pay | Admitting: Oncology

## 2023-05-01 DIAGNOSIS — I38 Endocarditis, valve unspecified: Secondary | ICD-10-CM | POA: Diagnosis not present

## 2023-05-01 DIAGNOSIS — I272 Pulmonary hypertension, unspecified: Secondary | ICD-10-CM | POA: Diagnosis not present

## 2023-05-01 DIAGNOSIS — E785 Hyperlipidemia, unspecified: Secondary | ICD-10-CM | POA: Diagnosis not present

## 2023-05-01 DIAGNOSIS — R001 Bradycardia, unspecified: Secondary | ICD-10-CM | POA: Diagnosis not present

## 2023-05-01 DIAGNOSIS — G459 Transient cerebral ischemic attack, unspecified: Secondary | ICD-10-CM | POA: Diagnosis not present

## 2023-05-01 DIAGNOSIS — I10 Essential (primary) hypertension: Secondary | ICD-10-CM | POA: Diagnosis not present

## 2023-05-01 DIAGNOSIS — I639 Cerebral infarction, unspecified: Secondary | ICD-10-CM | POA: Diagnosis not present

## 2023-05-01 DIAGNOSIS — N1832 Chronic kidney disease, stage 3b: Secondary | ICD-10-CM | POA: Diagnosis not present

## 2023-05-01 DIAGNOSIS — R0602 Shortness of breath: Secondary | ICD-10-CM | POA: Diagnosis not present

## 2023-05-01 DIAGNOSIS — R600 Localized edema: Secondary | ICD-10-CM | POA: Diagnosis not present

## 2023-05-01 DIAGNOSIS — Z87898 Personal history of other specified conditions: Secondary | ICD-10-CM | POA: Diagnosis not present

## 2023-05-03 DIAGNOSIS — I890 Lymphedema, not elsewhere classified: Secondary | ICD-10-CM | POA: Diagnosis not present

## 2023-05-05 ENCOUNTER — Ambulatory Visit: Payer: 59 | Admitting: Nurse Practitioner

## 2023-05-08 ENCOUNTER — Encounter: Payer: Self-pay | Admitting: *Deleted

## 2023-05-08 ENCOUNTER — Other Ambulatory Visit: Payer: 59

## 2023-05-11 ENCOUNTER — Ambulatory Visit: Payer: 59 | Admitting: *Deleted

## 2023-05-11 NOTE — Patient Outreach (Signed)
Care Coordination   Follow Up Visit Note   05/11/2023 Name: Tracie Fisher MRN: [id number] DOB: April 05, 1936  Tracie Fisher is a 87 y.o. year old female who sees [place], Corrie Dandy T, NP for primary care. I spoke with  Archie Endo by phone today.  What matters to the patients health and wellness today?  Patient report she is doing well.  PCP appointment that was scheduled for 12/20 has not been changed to 1/9. Continues to  use compression socks for leg swelling, but state it is better.  Denies any urgent concerns, encouraged to contact this care manager with questions.      Goals Addressed             This Visit's Progress    Management of chronic medical conditions       Interventions Today    Flowsheet Row Most Recent Value  Chronic Disease   Chronic disease during today's visit Other, Congestive Heart Failure (CHF), Chronic Obstructive Pulmonary Disease (COPD)  [lymphedema]  General Interventions   General Interventions Discussed/Reviewed General Interventions Reviewed, Doctor Visits  [Report she  has received lymphedema pumps, waiting for home visit for set up and instruction of use, which will be on 1/3]  Doctor Visits Discussed/Reviewed Doctor Visits Reviewed, PCP  [facility] 1/9, nephrology 1/20, cancer center 1/30]  PCP/Specialist Visits Compliance with follow-up visit  Education Interventions   Education Provided Provided Education  Provided Verbal Education On When to see the doctor, Medication, Nutrition  [Report she has been having hot flashes since taking Vitamin D3, will discuss with provider]  Nutrition Interventions   Nutrition Discussed/Reviewed Nutrition Reviewed, Fluid intake, Decreasing salt           COMPLETED: RNCM Care Management Expected Outcome:  Monitor, Self-Manage and Reduce Symptoms of: Memory changes   On track    Current Barriers:  Chronic Disease Management support and education needs related to memory changes   Planned Interventions: Evaluation  of current treatment plan related to dementia or memory changes and patient's adherence to plan as established by provider. The patient says that she is doing ok today but states that she was supposed to receive a lymphedema machine for her legs to help aid in the swelling. She states that PT has wrapped them twice with some benefit. She states at this time she has 2 blisters, with one being open. She states when this has happened in the past her PCP advised her to use soap and water to keep area clean and dry. She denies any signs or symptoms of infection at site. Advised patient to call the office for changes in memory, increased anxiety or acute changes in mental status. Denies any new concerns with her mentation, stress level or other concerns. Provided education to patient re: safety in the home, falls preventions, support system, and medication safety. Has had more than 2 falls in  the last year. No injuries. Denies any new falls. Reviewed medications with patient and discussed compliance. The patient keeps her medications in their original bottles and she has them set up where she remembers to take them each morning. She denies any issues with making sure she takes her medications as directed. Review of the ability for pharmacy support if needed. The patient denies any needs at this time related to medication needs. Will continue to monitor.  Reviewed scheduled/upcoming provider appointments including 05-05-2023 with the pcp Discussed plans with patient for ongoing care management follow up and provided patient with direct contact  information for care management team Advised patient to discuss changes in her memory, new questions or concerns about her health and well being with provider Screening for signs and symptoms of depression related to chronic disease state  Assessed social determinant of health barriers    Symptom Management: Take medications as prescribed   Attend all scheduled provider  appointments Call provider office for new concerns or questions  call the Suicide and Crisis Lifeline: 988 call the [place] National Suicide Prevention Lifeline: (760) 239-0108 or TTY: 480-192-0101 TTY (858)609-4236) to talk to a trained counselor call 1-800-273-TALK (toll free, 24 hour hotline) if experiencing a Mental Health or Behavioral Health Crisis   Follow Up Plan: Telephone follow up appointment with care management team member scheduled for: 05-08-2023 at 0945 am       COMPLETED: [facility] Care Management Expected Outcome:  Monitor, Self-Manage, and Reduce Symptoms of Hypertension   On track    Current Barriers:  Chronic Disease Management support and education needs related to effective management of HTN BP Readings from Last 3 Encounters:  03/27/23 122/69  03/23/23 126/62  03/20/23 126/66     Planned Interventions: Evaluation of current treatment plan related to hypertension self management and patient's adherence to plan as established by provider. Denies any issues with her blood pressures or heart health. Patients states that she randomly checks her blood pressure and on her last check it was good/baseline for her. Provided education to patient re: stroke prevention, s/s of heart attack and stroke. Has had a stroke in the past.  Reviewed prescribed diet heart healthy diet. States she maintains a heart healthy diet refraining from salty and high fattty foods.  Reviewed medications with patient and discussed importance of compliance. The patient states compliance with medications.;  Discussed plans with patient for ongoing care management follow up and provided patient with direct contact information for care management team; Advised patient, providing education and rationale, to monitor blood pressure daily and record, calling PCP for findings outside established parameters;  Reviewed scheduled/upcoming provider appointments including:05-05-2023 Advised patient to discuss changes in  HTN or heart health with provider; Provided education on prescribed diet heart healthy diet;  Discussed complications of poorly controlled blood pressure such as heart disease, stroke, circulatory complications, vision complications, kidney impairment, sexual dysfunction;  Screening for signs and symptoms of depression related to chronic disease state;  Assessed social determinant of health barriers;  Patient expressed concerns related to having to start iron infusions on Monday and states that the last time she was directly admitted from the clinic and also has a fear of needles. She continues to express that her 41 yo sister has been driving her to her appointments because her son is working 12hr shifts and it is difficult for him to take the time off. RN CM asked if she would like a referral to the care guides for transportation needs and she declined at this time stating that she will allow her sister to continue driving her but she would rather her not due to her being older than she is. She advised that if something changes, she would notify RNCM and have referral placed for transportation purposes.   Symptom Management: Take medications as prescribed   Attend all scheduled provider appointments Call provider office for new concerns or questions  call the Suicide and Crisis Lifeline: 988 call the [place] National Suicide Prevention Lifeline: 606-324-7039 or TTY: 754-143-1868 TTY 919-771-5687) to talk to a trained counselor call 1-800-273-TALK (toll free, 24 hour hotline) if  experiencing a Mental Health or Behavioral Health Crisis  check blood pressure weekly learn about high blood pressure call doctor for signs and symptoms of high blood pressure develop an action plan for high blood pressure keep all doctor appointments take medications for blood pressure exactly as prescribed report new symptoms to your doctor  Follow Up Plan: Telephone follow up appointment with care management team  member scheduled for: 05-08-2023 at 0945 am       COMPLETED: RNCM Care Management: Maintain, Monitor and Self-Manage Symptoms of COPD   On track    Current Barriers:  Chronic Disease Management support and education needs related to effective management of COPD  Planned Interventions: Provided patient with basic written and verbal COPD education on self care/management/and exacerbation prevention. Feels she is stable.  Advised patient to track and manage COPD triggers. Review of triggers that may exacerbate her conditions. She paces her activity and rest when she needs to do so. She states on 03-09-2023 she noticed that her heart rate was abnormal and she states that this has been going on for some time but she is following up with Cardiology soon. Provided written and verbal instructions on pursed lip breathing and utilized returned demonstration as teach back Provided instruction about proper use of medications used for management of COPD including inhalers Advised patient to self assesses COPD action plan zone and make appointment with provider if in the yellow zone for 48 hours without improvement Provided education about and advised patient to utilize infection prevention strategies to reduce risk of respiratory infection Discussed the importance of adequate rest and management of fatigue with COPD Screening for signs and symptoms of depression related to chronic disease state  Assessed social determinant of health barriers Review of weather changes and monitoring for acute changes in her breathing. She will see the pcp on 05-05-2023 for follow up.   Symptom Management: Take medications as prescribed   Attend all scheduled provider appointments Call provider office for new concerns or questions  call the Suicide and Crisis Lifeline: 988 call the [place] National Suicide Prevention Lifeline: 667-562-9048 or TTY: (367)372-3286 TTY 262-001-8951) to talk to a trained counselor call  1-800-273-TALK (toll free, 24 hour hotline) if experiencing a Mental Health or Behavioral Health Crisis  avoid second hand smoke eliminate smoking in my home identify and remove indoor air pollutants limit outdoor activity during cold weather listen for public air quality announcements every day do breathing exercises every day develop a rescue plan follow rescue plan if symptoms flare-up  Follow Up Plan: Telephone follow up appointment with care management team member scheduled for: 05-08-2023 at 0945 am          SDOH assessments and interventions completed:  No     Care Coordination Interventions:  Yes, provided   Follow up plan: Follow up call scheduled for 2/4    Encounter Outcome:  Patient Visit Completed   Rodney Langton, RN, MSN, CCM [place]  [facility], [facility] RN Care Coordinator Direct Dial: [phone] / Main [phone] Fax [phone] Email: [email or web address]: [email or web address]

## 2023-05-16 DIAGNOSIS — M7918 Myalgia, other site: Secondary | ICD-10-CM | POA: Diagnosis not present

## 2023-05-16 DIAGNOSIS — M5481 Occipital neuralgia: Secondary | ICD-10-CM | POA: Diagnosis not present

## 2023-05-18 ENCOUNTER — Telehealth (INDEPENDENT_AMBULATORY_CARE_PROVIDER_SITE_OTHER): Payer: Self-pay

## 2023-05-18 NOTE — Telephone Encounter (Signed)
 Patient called wanting to know if she need the lymph pumps. She stated her legs have went down and are doing well with just the compression socks. She don't want to use the pumps.   Previous conversation- Patient will discussthe use of pumps with the representative and all questions will be answer.

## 2023-05-19 NOTE — Patient Instructions (Signed)

## 2023-05-23 DIAGNOSIS — Z9181 History of falling: Secondary | ICD-10-CM | POA: Diagnosis not present

## 2023-05-23 DIAGNOSIS — R262 Difficulty in walking, not elsewhere classified: Secondary | ICD-10-CM | POA: Diagnosis not present

## 2023-05-23 DIAGNOSIS — I10 Essential (primary) hypertension: Secondary | ICD-10-CM | POA: Diagnosis not present

## 2023-05-23 DIAGNOSIS — R2689 Other abnormalities of gait and mobility: Secondary | ICD-10-CM | POA: Diagnosis not present

## 2023-05-23 DIAGNOSIS — M6281 Muscle weakness (generalized): Secondary | ICD-10-CM | POA: Diagnosis not present

## 2023-05-25 ENCOUNTER — Ambulatory Visit: Payer: 59 | Admitting: Nurse Practitioner

## 2023-05-25 ENCOUNTER — Encounter: Payer: Self-pay | Admitting: Nurse Practitioner

## 2023-05-25 VITALS — BP 117/72 | HR 61 | Temp 98.3°F | Resp 18 | Ht 61.0 in | Wt 146.0 lb

## 2023-05-25 DIAGNOSIS — I272 Pulmonary hypertension, unspecified: Secondary | ICD-10-CM

## 2023-05-25 DIAGNOSIS — I10 Essential (primary) hypertension: Secondary | ICD-10-CM | POA: Diagnosis not present

## 2023-05-25 DIAGNOSIS — D631 Anemia in chronic kidney disease: Secondary | ICD-10-CM

## 2023-05-25 DIAGNOSIS — Z8673 Personal history of transient ischemic attack (TIA), and cerebral infarction without residual deficits: Secondary | ICD-10-CM | POA: Diagnosis not present

## 2023-05-25 DIAGNOSIS — I890 Lymphedema, not elsewhere classified: Secondary | ICD-10-CM | POA: Diagnosis not present

## 2023-05-25 DIAGNOSIS — F5101 Primary insomnia: Secondary | ICD-10-CM

## 2023-05-25 DIAGNOSIS — N184 Chronic kidney disease, stage 4 (severe): Secondary | ICD-10-CM | POA: Diagnosis not present

## 2023-05-25 DIAGNOSIS — G629 Polyneuropathy, unspecified: Secondary | ICD-10-CM

## 2023-05-25 DIAGNOSIS — D692 Other nonthrombocytopenic purpura: Secondary | ICD-10-CM

## 2023-05-25 DIAGNOSIS — E782 Mixed hyperlipidemia: Secondary | ICD-10-CM

## 2023-05-25 DIAGNOSIS — M5481 Occipital neuralgia: Secondary | ICD-10-CM | POA: Diagnosis not present

## 2023-05-25 MED ORDER — PREGABALIN 75 MG PO CAPS: 75.0000 mg | ORAL_CAPSULE | Freq: Three times a day (TID) | ORAL | 1 refills | Status: DC

## 2023-05-25 MED ORDER — ALPRAZOLAM 1 MG PO TABS: ORAL_TABLET | ORAL | 2 refills | Status: DC

## 2023-05-25 NOTE — Assessment & Plan Note (Signed)
Chronic, ongoing.  Continues to be followed by neurology.  Will continue this collaboration and medication regimen as ordered by them.  Recent note reviewed.

## 2023-05-25 NOTE — Assessment & Plan Note (Signed)
 Chronic, ongoing for years.  Previous PCP, Dr. Corlis, placed her on Xanax  many years ago and she still takes, reports she cannot sleep without it.  Refuses to try anything else. At this time will continue current regimen and adjust if possible.  Aware of need for every 3 month visits. Controlled substance agreement at next visit and UDS up to date (due next 08/04/23).  Return in 3 months.

## 2023-05-25 NOTE — Progress Notes (Signed)
 BP 117/72 (BP Location: Left Arm)   Pulse 61   Temp 98.3 F (36.8 C) (Oral)   Resp 18   Ht 5' 1 (1.549 m)   Wt 146 lb (66.2 kg)   SpO2 97%   BMI 27.59 kg/m    Subjective:    Patient ID: Tracie Fisher, female    DOB: 1936/01/03, 88 y.o.   MRN: [id number]  HPI: Tracie Fisher is a 88 y.o. female  Chief Complaint  Patient presents with   Hyperlipidemia   Hypertension   Insomnia   INSOMNIA Has been on Xanax  for many years, Dr. Corlis, her previous PCP, started years ago. She wishes to remain on it -- takes 1/2 tablet Xanax  in morning and 1 tablet at night.  Pt is aware of risks of psychoactive medication use to include increased sedation, respiratory suppression, falls, extrapyramidal movements, dependence and cardiovascular events.  Pt would like to continue treatment as benefit determined to outweigh risk.  Last filled on 04/25/23. Duration: chronic Satisfied with sleep quality: yes Difficulty falling asleep: yes Difficulty staying asleep: yes Waking a few hours after sleep onset: no Early morning awakenings: yes Daytime hypersomnolence: no Wakes feeling refreshed: yes Good sleep hygiene: yes Apnea: no Snoring: no Depressed/anxious mood: yes Recent stress: no Restless legs/nocturnal leg cramps: no Chronic pain/arthritis: yes  CHRONIC KIDNEY DISEASE Follows with nephrology, last visit 03/29/23 and they started low dose Metolazone.  Is seeing hematology for anemia with last visit 03/06/23 -- IV iron .  Reports overall feeling better. CKD status: stable Medications renally dose: yes Previous renal evaluation: yes Pneumovax:  Up to Date Influenza Vaccine:  Up to Date  HYPERTENSION / HYPERLIPIDEMIA Continues on Metolazone, Plavix , Torsemide , and Pravastatin . Mild pulmonary hypertension. Saw cardiology, Dr. Florencio, 05/01/23, echo March 2024 65-70%.   History of stroke years ago and last stroke on 08/04/22. Saw neurology last 02/22/23, received trigger point injections  for occipital neuralgia on 05/16/23 -- the shots are not offering benefit at this time.  Takes Lyrica  75 MG TID for peripheral neuropathy.  Vascular last 03/27/23 -- using pumps for lymphedema and noticing significant improvement in swelling of legs + has lost fluid weight with these. Satisfied with current treatment? yes Duration of hypertension: chronic BP monitoring frequency: daily BP range: 110-120/70 range BP medication side effects: no Duration of hyperlipidemia: chronic Cholesterol medication side effects: no Cholesterol supplements: none Medication compliance: good compliance Aspirin: no Recent stressors: no Recurrent headaches: no Visual changes: no Palpitations: no Dyspnea: occasionally a little bit Chest pain: no Lower extremity edema: at baseline, can not put on compression hose Dizzy/lightheaded: no     09/13/2022   11:20 AM 07/15/2022   11:31 AM  6CIT Screen  What Year? 0 points 0 points  What month? 0 points 0 points  What time? 0 points 0 points  Count back from 20 4 points 0 points  Months in reverse 4 points 4 points  Repeat phrase 6 points 8 points  Total Score 14 points 12 points      02/03/2023    1:49 PM  MMSE - Mini Mental State Exam  Orientation to time 5  Orientation to Place 5  Registration 3  Attention/ Calculation 5  Recall 3  Language- name 2 objects 2  Language- repeat 1  Language- follow 3 step command 3  Language- read & follow direction 1  Write a sentence 1  Copy design 1  Total score 30    Relevant past medical, surgical,  family and social history reviewed and updated as indicated. Interim medical history since our last visit reviewed. Allergies and medications reviewed and updated.  Review of Systems  Constitutional:  Negative for activity change, appetite change, diaphoresis, fatigue and fever.  Respiratory:  Negative for cough, chest tightness and shortness of breath.   Cardiovascular:  Negative for chest pain, palpitations and  leg swelling.  Gastrointestinal: Negative.   Neurological: Negative.   Psychiatric/Behavioral:  Negative for decreased concentration, self-injury, sleep disturbance and suicidal ideas.    Per HPI unless specifically indicated above     Objective:    BP 117/72 (BP Location: Left Arm)   Pulse 61   Temp 98.3 F (36.8 C) (Oral)   Resp 18   Ht 5' 1 (1.549 m)   Wt 146 lb (66.2 kg)   SpO2 97%   BMI 27.59 kg/m   Wt Readings from Last 3 Encounters:  05/25/23 146 lb (66.2 kg)  03/06/23 152 lb 12.8 oz (69.3 kg)  02/28/23 151 lb (68.5 kg)    Physical Exam Vitals and nursing note reviewed.  Constitutional:      General: She is awake. She is not in acute distress.    Appearance: She is well-developed and well-groomed. She is not ill-appearing or toxic-appearing.  HENT:     Head: Normocephalic.     Right Ear: Hearing and external ear normal.     Left Ear: Hearing and external ear normal.  Eyes:     General: Lids are normal.        Right eye: No discharge.        Left eye: No discharge.     Conjunctiva/sclera: Conjunctivae normal.     Pupils: Pupils are equal, round, and reactive to light.  Neck:     Thyroid : No thyromegaly.     Vascular: No carotid bruit.  Cardiovascular:     Rate and Rhythm: Normal rate and regular rhythm.     Heart sounds: Normal heart sounds. No murmur heard.    No gallop.     Comments: Much improved legs today, no edema. Pulmonary:     Effort: Pulmonary effort is normal. No accessory muscle usage or respiratory distress.     Breath sounds: Normal breath sounds.  Abdominal:     General: Bowel sounds are normal. There is no distension.     Palpations: Abdomen is soft.     Tenderness: There is no abdominal tenderness.  Musculoskeletal:     Cervical back: Normal range of motion and neck supple.     Right lower leg: No edema.     Left lower leg: No edema.  Lymphadenopathy:     Cervical: No cervical adenopathy.  Skin:    General: Skin is warm and dry.   Neurological:     Mental Status: She is alert and oriented to person, place, and time.     Deep Tendon Reflexes: Reflexes are normal and symmetric.     Reflex Scores:      Brachioradialis reflexes are 2+ on the right side and 2+ on the left side.      Patellar reflexes are 2+ on the right side and 2+ on the left side. Psychiatric:        Attention and Perception: Attention normal.        Mood and Affect: Mood normal.        Speech: Speech normal.        Behavior: Behavior normal. Behavior is cooperative.  Thought Content: Thought content normal.    Results for orders placed or performed in visit on 02/21/23  Iron  Binding Cap (TIBC)([facility])   Collection Time: 02/21/23 11:54 AM  Result Value Ref Range   Total Iron  Binding Capacity 328 250 - 450 ug/dL   UIBC 698 881 - 630 ug/dL   Iron  27 27 - 139 ug/dL   Iron  Saturation 8 (LL) 15 - 55 %  Ferritin   Collection Time: 02/21/23 11:54 AM  Result Value Ref Range   Ferritin 83 15 - 150 ng/mL  CBC   Collection Time: 02/21/23 12:03 PM  Result Value Ref Range   WBC 6.0 3.4 - 10.8 x10E3/uL   RBC 3.41 (L) 3.77 - 5.28 x10E6/uL   Hemoglobin 10.9 (L) 11.1 - 15.9 g/dL   Hematocrit 66.6 (L) 65.9 - 46.6 %   MCV 98 (H) 79 - 97 fL   MCH 32.0 26.6 - 33.0 pg   MCHC 32.7 31.5 - 35.7 g/dL   RDW 86.6 88.2 - 84.5 %   Platelets 178 150 - 450 x10E3/uL      Assessment & Plan:   Problem List Items Addressed This Visit       Cardiovascular and Mediastinum   Essential hypertension   Chronic, stable.  BP well below goal for her age.  Recommend she monitor BP at least a few mornings a week at home and document.  DASH diet at home.  Continue Torsemide  only.  Collaboration with cardiology - recent note reviewed.  Labs today: up to date.  Urine ALB 25 June 2022.         Moderate pulmonary hypertension (HCC) - Primary   Chronic, ongoing.  Followed by cardiology, continue current medication regimen as prescribed by them and  collaboration.  Recent notes reviewed. Would benefit return to pulmonary in future.      Senile purpura (HCC)   Noted to upper and lower extremities.  Recommended to patient gentle cleansing daily with soap and water + application of lotion.  Monitor closely for skin breakdown or wounds, immediately notify provider if present.        Nervous and Auditory   Neuropathy   Chronic, ongoing.  Continues to be followed by neurology.  Will continue this collaboration and medication regimen as ordered by them.  Recent note reviewed.  Her CrCl was 37 at recent visit, would avoid increasing Lyrica .      Occipital neuralgia   Chronic, ongoing.  Continues to be followed by neurology.  Will continue this collaboration and medication regimen as ordered by them.  Recent note reviewed.      Relevant Medications   pregabalin  (LYRICA ) 75 MG capsule   ALPRAZolam  (XANAX ) 1 MG tablet     Genitourinary   CKD (chronic kidney disease) stage 4, GFR 15-29 ml/min (HCC)   Chronic, ongoing.  Followed by nephrology.  Currently CKD 4.  Educated her on diet and fluid intake + current medications. Urine ALB 25 June 2022.  Recommend continue all current medications and wear compression hose at home on during day and off at night.  Labs up to date with nephrology.        Other   Anemia due to stage 4 chronic kidney disease (HCC)   Chronic, improving.  Appearance is improved with more color to face.  Followed by hematology, continue this collaboration and IV iron  as prescribed by them.  Recent notes reviewed.      History of stroke   History of CVA, most  recent on 08/04/22.  Continue collaboration with neurology and current medication regimen for prevention.  Labs up to date.  Close monitoring.  BP goal <130/80, A1c <6.5%, and LDL <55.      Insomnia   Chronic, ongoing for years.  Previous PCP, Dr. Corlis, placed her on Xanax  many years ago and she still takes, reports she cannot sleep without it.  Refuses to try  anything else. At this time will continue current regimen and adjust if possible.  Aware of need for every 3 month visits. Controlled substance agreement at next visit and UDS up to date (due next 08/04/23).  Return in 3 months.      Relevant Medications   ALPRAZolam  (XANAX ) 1 MG tablet   Lymphedema   Chronic and much improving with pumps.  No edema today and fluid weight loss present.  Continue collaboration with vascular and using pumps daily.  Recent vascular note reviewed.      Mixed hyperlipidemia   Chronic, ongoing.  Continue current medication regimen and adjust as needed.  Labs up to date.         Follow up plan: Return in about 3 months (around 08/23/2023) for HTN/HLD, CKD, INSOMNIA.

## 2023-05-25 NOTE — Assessment & Plan Note (Signed)
 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  Continue collaboration with neurology and current medication regimen for prevention.  Labs up to date.  Close monitoring.  BP goal <130/80, A1c <6.5%, and LDL <55.

## 2023-05-25 NOTE — Assessment & Plan Note (Addendum)
 Chronic, ongoing.  Continues to be followed by neurology.  Will continue this collaboration and medication regimen as ordered by them.  Recent note reviewed.  Her CrCl was 37 at recent visit, would avoid increasing Lyrica.

## 2023-05-25 NOTE — Assessment & Plan Note (Signed)
 Chronic, ongoing.  Followed by cardiology, continue current medication regimen as prescribed by them and collaboration.  Recent notes reviewed. Would benefit return to pulmonary in future.

## 2023-05-25 NOTE — Assessment & Plan Note (Signed)
 Chronic, ongoing.  Continue current medication regimen and adjust as needed.  Labs up to date.

## 2023-05-25 NOTE — Assessment & Plan Note (Signed)
 Chronic and much improving with pumps.  No edema today and fluid weight loss present.  Continue collaboration with vascular and using pumps daily.  Recent vascular note reviewed.

## 2023-05-25 NOTE — Assessment & Plan Note (Signed)
 Chronic, improving.  Appearance is improved with more color to face.  Followed by hematology, continue this collaboration and IV iron as prescribed by them.  Recent notes reviewed.

## 2023-05-25 NOTE — Assessment & Plan Note (Signed)
 Chronic, stable.  BP well below goal for her age.  Recommend she monitor BP at least a few mornings a week at home and document.  DASH diet at home.  Continue Torsemide  only.  Collaboration with cardiology - recent note reviewed.  Labs today: up to date.  Urine ALB 25 June 2022.

## 2023-05-25 NOTE — Assessment & Plan Note (Signed)
 Chronic, ongoing.  Followed by nephrology.  Currently CKD 4.  Educated her on diet and fluid intake + current medications. Urine ALB 25 June 2022.  Recommend continue all current medications and wear compression hose at home on during day and off at night.  Labs up to date with nephrology.

## 2023-05-25 NOTE — Assessment & Plan Note (Signed)
Noted to upper and lower extremities.  Recommended to patient gentle cleansing daily with soap and water + application of lotion.  Monitor closely for skin breakdown or wounds, immediately notify provider if present. 

## 2023-05-29 DIAGNOSIS — E875 Hyperkalemia: Secondary | ICD-10-CM | POA: Diagnosis not present

## 2023-05-29 DIAGNOSIS — R600 Localized edema: Secondary | ICD-10-CM | POA: Diagnosis not present

## 2023-05-29 DIAGNOSIS — I129 Hypertensive chronic kidney disease with stage 1 through stage 4 chronic kidney disease, or unspecified chronic kidney disease: Secondary | ICD-10-CM | POA: Diagnosis not present

## 2023-05-29 DIAGNOSIS — N1832 Chronic kidney disease, stage 3b: Secondary | ICD-10-CM | POA: Diagnosis not present

## 2023-05-30 ENCOUNTER — Encounter: Payer: Self-pay | Admitting: Oncology

## 2023-06-01 DIAGNOSIS — H6123 Impacted cerumen, bilateral: Secondary | ICD-10-CM | POA: Diagnosis not present

## 2023-06-01 DIAGNOSIS — H00019 Hordeolum externum unspecified eye, unspecified eyelid: Secondary | ICD-10-CM | POA: Diagnosis not present

## 2023-06-05 DIAGNOSIS — I129 Hypertensive chronic kidney disease with stage 1 through stage 4 chronic kidney disease, or unspecified chronic kidney disease: Secondary | ICD-10-CM | POA: Diagnosis not present

## 2023-06-05 DIAGNOSIS — N1832 Chronic kidney disease, stage 3b: Secondary | ICD-10-CM | POA: Diagnosis not present

## 2023-06-05 DIAGNOSIS — R600 Localized edema: Secondary | ICD-10-CM | POA: Diagnosis not present

## 2023-06-08 DIAGNOSIS — H6063 Unspecified chronic otitis externa, bilateral: Secondary | ICD-10-CM | POA: Diagnosis not present

## 2023-06-08 DIAGNOSIS — H6123 Impacted cerumen, bilateral: Secondary | ICD-10-CM | POA: Diagnosis not present

## 2023-06-13 ENCOUNTER — Other Ambulatory Visit: Payer: Self-pay | Admitting: *Deleted

## 2023-06-13 DIAGNOSIS — D509 Iron deficiency anemia, unspecified: Secondary | ICD-10-CM

## 2023-06-14 ENCOUNTER — Inpatient Hospital Stay: Payer: 59 | Attending: Oncology

## 2023-06-14 DIAGNOSIS — Z79899 Other long term (current) drug therapy: Secondary | ICD-10-CM | POA: Insufficient documentation

## 2023-06-14 DIAGNOSIS — Z862 Personal history of diseases of the blood and blood-forming organs and certain disorders involving the immune mechanism: Secondary | ICD-10-CM | POA: Diagnosis not present

## 2023-06-14 DIAGNOSIS — N189 Chronic kidney disease, unspecified: Secondary | ICD-10-CM | POA: Diagnosis not present

## 2023-06-14 DIAGNOSIS — D509 Iron deficiency anemia, unspecified: Secondary | ICD-10-CM

## 2023-06-14 LAB — CBC WITH DIFFERENTIAL/PLATELET
Abs Immature Granulocytes: 0.04 K/uL (ref 0.00–0.07)
Basophils Absolute: 0 K/uL (ref 0.0–0.1)
Basophils Relative: 1 %
Eosinophils Absolute: 0.2 K/uL (ref 0.0–0.5)
Eosinophils Relative: 3 %
HCT: 38.7 % (ref 36.0–46.0)
Hemoglobin: 12.4 g/dL (ref 12.0–15.0)
Immature Granulocytes: 1 %
Lymphocytes Relative: 32 %
Lymphs Abs: 2.1 K/uL (ref 0.7–4.0)
MCH: 31.3 pg (ref 26.0–34.0)
MCHC: 32 g/dL (ref 30.0–36.0)
MCV: 97.7 fL (ref 80.0–100.0)
Monocytes Absolute: 0.6 K/uL (ref 0.1–1.0)
Monocytes Relative: 10 %
Neutro Abs: 3.5 K/uL (ref 1.7–7.7)
Neutrophils Relative %: 53 %
Platelets: 286 K/uL (ref 150–400)
RBC: 3.96 MIL/uL (ref 3.87–5.11)
RDW: 13.8 % (ref 11.5–15.5)
WBC: 6.5 K/uL (ref 4.0–10.5)
nRBC: 0 % (ref 0.0–0.2)

## 2023-06-14 LAB — IRON AND TIBC
Iron: 75 ug/dL (ref 28–170)
Saturation Ratios: 18 % (ref 10.4–31.8)
TIBC: 409 ug/dL (ref 250–450)
UIBC: 334 ug/dL

## 2023-06-14 LAB — FERRITIN: Ferritin: 36 ng/mL (ref 11–307)

## 2023-06-15 ENCOUNTER — Inpatient Hospital Stay: Payer: 59

## 2023-06-15 ENCOUNTER — Inpatient Hospital Stay (HOSPITAL_BASED_OUTPATIENT_CLINIC_OR_DEPARTMENT_OTHER): Payer: 59 | Admitting: Oncology

## 2023-06-15 ENCOUNTER — Encounter: Payer: Self-pay | Admitting: Oncology

## 2023-06-15 VITALS — BP 127/62 | HR 72 | Temp 97.9°F | Resp 16 | Ht 61.0 in | Wt 147.0 lb

## 2023-06-15 DIAGNOSIS — N189 Chronic kidney disease, unspecified: Secondary | ICD-10-CM | POA: Diagnosis not present

## 2023-06-15 DIAGNOSIS — Z79899 Other long term (current) drug therapy: Secondary | ICD-10-CM | POA: Diagnosis not present

## 2023-06-15 DIAGNOSIS — D509 Iron deficiency anemia, unspecified: Secondary | ICD-10-CM

## 2023-06-15 DIAGNOSIS — Z862 Personal history of diseases of the blood and blood-forming organs and certain disorders involving the immune mechanism: Secondary | ICD-10-CM | POA: Diagnosis not present

## 2023-06-15 NOTE — Progress Notes (Signed)
[facility]  [phone]) [phone] Fax:([phone]) [phone]  ID: Archie Endo OB: [birth year]  MR#: [id number]  AOZ#:[id number]  Patient Care Team: Marjie Skiff, NP as PCP - General (Nurse Practitioner) Rodney Langton, RN as [facility] Care Management (General Practice) Orlie Dakin, Tollie Pizza, MD as Consulting Physician (Oncology) Mosetta Pigeon, MD as Consulting Physician (Nephrology) Alwyn Pea, MD as Consulting Physician (Cardiology) Galen Manila, MD as Referring Physician (Ophthalmology) Gilda Crease, Latina Craver, MD as Consulting Physician (Vascular Surgery)  CHIEF COMPLAINT: Iron deficiency anemia.  INTERVAL HISTORY: Patient returns to clinic today for repeat laboratory work, further evaluation, and consideration of additional IV Venofer.  She currently feels well and is asymptomatic.  She does not complain of any weakness or fatigue today.  She has no neurologic complaints.  She denies any recent fevers or illnesses.  She has a good appetite and denies weight loss.  She has no chest pain, shortness of breath, cough, or hemoptysis.  She denies any nausea, vomiting, constipation, or diarrhea.  She has no melena or hematochezia.  She has no urinary complaints.  Patient offers no specific complaints today.  REVIEW OF SYSTEMS:   Review of Systems  Constitutional: Negative.  Negative for fever, malaise/fatigue and weight loss.  Respiratory:  Negative for cough, hemoptysis and shortness of breath.   Cardiovascular: Negative.  Negative for chest pain and leg swelling.  Gastrointestinal: Negative.  Negative for abdominal pain, blood in stool and melena.  Genitourinary: Negative.  Negative for hematuria.  Musculoskeletal: Negative.  Negative for back pain.  Skin: Negative.  Negative for rash.  Neurological: Negative.  Negative for dizziness, focal weakness, weakness and headaches.  Psychiatric/Behavioral: Negative.  The patient is not nervous/anxious.      As per HPI. Otherwise, a complete review of systems is negative.  PAST MEDICAL HISTORY: Past Medical History:  Diagnosis Date   Cancer (HCC)    SKIN   Complication of anesthesia    Depression    Dysrhythmia    GERD (gastroesophageal reflux disease)    Hiatal hernia    HOH (hard of hearing)    Hypertension    Hypothyroidism    PONV (postoperative nausea and vomiting)    Thyroid disease     PAST SURGICAL HISTORY: Past Surgical History:  Procedure Laterality Date   APPENDECTOMY     BACK SURGERY     MULTIPLE/ROD IN BACK   BREAST CYST ASPIRATION Left "years ago"   CATARACT EXTRACTION W/PHACO Left 03/21/2017   Procedure: CATARACT EXTRACTION PHACO AND INTRAOCULAR LENS PLACEMENT (IOC);  Surgeon: Galen Manila, MD;  Location: [facility] ORS;  Service: Ophthalmology;  Laterality: Left;  [place] 00:48 AP% 17.8 CDE 8.57 Fluid pack lot # [id number]   CATARACT EXTRACTION W/PHACO Right 04/11/2017   Procedure: CATARACT EXTRACTION PHACO AND INTRAOCULAR LENS PLACEMENT (IOC);  Surgeon: Galen Manila, MD;  Location: [facility] ORS;  Service: Ophthalmology;  Laterality: Right;  [place] 01:09.5 AP% 15.8 CDE 11.04 Fluid Pack lot # [id number]   CHOLECYSTECTOMY     COLON SURGERY     BOWEL OBSTRUCTION   COLONOSCOPY WITH PROPOFOL N/A 04/25/2016   Procedure: COLONOSCOPY WITH PROPOFOL;  Surgeon: Wyline Mood, MD;  Location: [facility] ENDOSCOPY;  Service: Endoscopy;  Laterality: N/A;   ESOPHAGOGASTRODUODENOSCOPY (EGD) WITH PROPOFOL N/A 04/25/2016   Procedure: ESOPHAGOGASTRODUODENOSCOPY (EGD) WITH PROPOFOL;  Surgeon: Wyline Mood, MD;  Location: [facility] ENDOSCOPY;  Service: Endoscopy;  Laterality: N/A;   EXPLORATORY LAPAROTOMY  10/18/2011   blockage small intestines   GIVENS CAPSULE STUDY N/A 05/11/2016  Procedure: GIVENS CAPSULE STUDY;  Surgeon: Wyline Mood, MD;  Location: [facility]COPY;  Service: Endoscopy;  Laterality: N/A;    FAMILY HISTORY: Family History  Problem Relation Age of Onset   Breast cancer Sister 51    Heart attack Brother    Heart disease Father     ADVANCED DIRECTIVES (Y/N):  N  HEALTH MAINTENANCE: Social History   Tobacco Use   Smoking status: Former    Types: Cigarettes   Smokeless tobacco: Never  Vaping Use   Vaping status: Never Used  Substance Use Topics   Alcohol use: Yes    Comment: 1 beer /day or 2 occ   Drug use: No     Colonoscopy:  PAP:  Bone density:  Lipid panel:  Allergies  Allergen Reactions   Aspirin Itching   Penicillins Other (See Comments)    Unknown reaction Has patient had a PCN reaction causing immediate rash, facial/tongue/throat swelling, SOB or lightheadedness with hypotension: Unknown Has patient had a PCN reaction causing severe rash involving mucus membranes or skin necrosis: Unknown Has patient had a PCN reaction that required hospitalization: Unknown Has patient had a PCN reaction occurring within the last 10 years: No If all of the above answers are "NO", then may proceed with Cephalosporin use. Patient does not remember the reaction to penicillin.   Penicillin G     Unknown reaction Has patient had a PCN reaction causing immediate rash, facial/tongue/throat swelling, SOB or lightheadedness with hypotension: Unknown Has patient had a PCN reaction causing severe rash involving mucus membranes or skin necrosis: Unknown Has patient had a PCN reaction that required hospitalization: Unknown Has patient had a PCN reaction occurring within the last 10 years: No If all of the above answers are "NO", then may proceed with Cephalosporin use.     Current Outpatient Medications  Medication Sig Dispense Refill   ALPRAZolam (XANAX) 1 MG tablet Take 0.5 mg (1/2 tablet) by mouth in the morning and 1 mg (1 tablet) by mouth at bedtime. 45 tablet 2   Cholecalciferol (VITAMIN D3) 50 MCG (2000 UT) CAPS Take 2,000 Units by mouth daily.     clopidogrel (PLAVIX) 75 MG tablet Take 1 tablet (75 mg total) by mouth daily. 90 tablet 4   cyanocobalamin (V-R  VITAMIN B-12) 500 MCG tablet Take 500 mcg by mouth daily.      erythromycin ophthalmic ointment Place 1 Application into the left eye 3 (three) times daily.     ferrous gluconate (FERGON) 240 (27 FE) MG tablet Take 240 mg by mouth 2 (two) times daily.     levothyroxine (SYNTHROID) 50 MCG tablet Take 1 tablet (50 mcg total) by mouth daily before breakfast. 90 tablet 4   metolazone (ZAROXOLYN) 2.5 MG tablet Take 2.5 mg by mouth once a week.     ofloxacin (FLOXIN) 0.3 % OTIC solution Place 5 drops into both ears daily.     pravastatin (PRAVACHOL) 20 MG tablet TAKE 1 TABLET BY MOUTH EVERY EVENING 90 tablet 4   pregabalin (LYRICA) 75 MG capsule Take 1 capsule (75 mg total) by mouth 3 (three) times daily. 90 capsule 1   Probiotic Product (PROBIOTIC DAILY PO) Take 1 capsule by mouth daily as needed.     torsemide (DEMADEX) 20 MG tablet Take 20 mg by mouth daily.     No current facility-administered medications for this visit.    OBJECTIVE: Vitals:   06/15/23 1350  BP: 127/62  Pulse: 72  Resp: 16  Temp: 97.9 F (  36.6 C)  SpO2: 97%      Body mass index is 27.78 kg/m.    ECOG FS:1 - Symptomatic but completely ambulatory  General: Well-developed, well-nourished, no acute distress. Eyes: Pink conjunctiva, anicteric sclera. HEENT: Normocephalic, moist mucous membranes. Lungs: No audible wheezing or coughing. Heart: Regular rate and rhythm. Abdomen: Soft, nontender, no obvious distention. Musculoskeletal: No edema, cyanosis, or clubbing. Neuro: Alert, answering all questions appropriately. Cranial nerves grossly intact. Skin: No rashes or petechiae noted. Psych: Normal affect.  LAB RESULTS:  Lab Results  Component Value Date   NA 142 02/03/2023   K 3.8 02/03/2023   CL 102 02/03/2023   CO2 24 02/03/2023   GLUCOSE 86 02/03/2023   BUN 23 02/03/2023   CREATININE 1.20 (H) 02/03/2023   CALCIUM 9.1 02/03/2023   PROT 5.9 (L) 02/03/2023   ALBUMIN 4.0 02/03/2023   AST 23 02/03/2023   ALT  10 02/03/2023   ALKPHOS 84 02/03/2023   BILITOT 0.2 02/03/2023   GFRNONAA 49 (L) 12/04/2022   GFRAA 52 (L) 10/13/2019    Lab Results  Component Value Date   WBC 6.5 06/14/2023   NEUTROABS 3.5 06/14/2023   HGB 12.4 06/14/2023   HCT 38.7 06/14/2023   MCV 97.7 06/14/2023   PLT 286 06/14/2023   Lab Results  Component Value Date   IRON 75 06/14/2023   TIBC 409 06/14/2023   IRONPCTSAT 18 06/14/2023   Lab Results  Component Value Date   FERRITIN 36 06/14/2023     STUDIES: No results found.  ASSESSMENT: Iron deficiency anemia.  PLAN:    Iron deficiency anemia: Resolved.  Patient's hemoglobin and iron stores are now within normal limits.  She does not require additional IV Venofer.  Patient last received treatment on March 23, 2023.  No intervention is needed at this time.  Return to clinic in 4 months with repeat laboratory work, further evaluation, and consideration of additional IV Venofer if necessary.   Chronic renal insufficiency: Chronic and unchanged.  Continue follow-up with nephrology as scheduled.  Patient does not require Retacrit at this time.  I spent a total of 20 minutes reviewing chart data, face-to-face evaluation with the patient, counseling and coordination of care as detailed above.   Patient expressed understanding and was in agreement with this plan. She also understands that She can call clinic at any time with any questions, concerns, or complaints.    Jeralyn Ruths, MD   06/15/2023 2:28 PM

## 2023-06-20 ENCOUNTER — Ambulatory Visit: Payer: Self-pay | Admitting: *Deleted

## 2023-06-20 NOTE — Patient Outreach (Addendum)
  Care Coordination   06/20/2023 Name: Tracie Fisher MRN: [id number] DOB: [birth year]   Care Coordination Outreach Attempts:  An unsuccessful outreach was attempted for an appointment today.  Outreach attempt to both listed numbers, unsuccessful.  Patient called CMA and was provided updated number [phone]) with request to call back at 3pm per patient.  Call placed again, no answer, unable to leave message.   Follow Up Plan:  Additional outreach attempts will be made to offer the patient complex care management information and services.   Encounter Outcome:  No Answer   Care Coordination Interventions:  No, not indicated    Odella Ku, RN, MSN, CCM [place]  [facility], [facility] RN Care Coordinator Direct Dial: [phone] / Main [phone] Fax [phone] Email: [email or web address] Website: [email or web address]

## 2023-06-23 DIAGNOSIS — Z9181 History of falling: Secondary | ICD-10-CM | POA: Diagnosis not present

## 2023-06-23 DIAGNOSIS — I10 Essential (primary) hypertension: Secondary | ICD-10-CM | POA: Diagnosis not present

## 2023-06-23 DIAGNOSIS — M6281 Muscle weakness (generalized): Secondary | ICD-10-CM | POA: Diagnosis not present

## 2023-06-23 DIAGNOSIS — R262 Difficulty in walking, not elsewhere classified: Secondary | ICD-10-CM | POA: Diagnosis not present

## 2023-06-23 DIAGNOSIS — R2689 Other abnormalities of gait and mobility: Secondary | ICD-10-CM | POA: Diagnosis not present

## 2023-06-26 ENCOUNTER — Other Ambulatory Visit: Payer: Self-pay | Admitting: Nurse Practitioner

## 2023-06-26 DIAGNOSIS — Z1231 Encounter for screening mammogram for malignant neoplasm of breast: Secondary | ICD-10-CM

## 2023-07-03 ENCOUNTER — Ambulatory Visit: Payer: Self-pay | Admitting: *Deleted

## 2023-07-03 NOTE — Patient Instructions (Signed)
Visit Information  Thank you for taking time to visit with me today. Please don't hesitate to contact me if I can be of assistance to you before our next scheduled telephone appointment.  Following are the goals we discussed today:  Monitor fluid levels by checking weights every day.  Listen for call form car guide  to provide affordable dentists.  Our next appointment is by telephone on 4/14  Please call the care guide team at [phone] if you need to cancel or reschedule your appointment.   Please call the Suicide and Crisis Lifeline: 988 call the [place] National Suicide Prevention Lifeline: (618)786-3032 or TTY: (519)061-8081 TTY (204) 269-5065) to talk to a trained counselor call 1-800-273-TALK (toll free, 24 hour hotline) call 911 if you are experiencing a Mental Health or Behavioral Health Crisis or need someone to talk to.  The patient verbalized understanding of instructions, educational materials, and care plan provided today and DECLINED offer to receive copy of patient instructions, educational materials, and care plan.   The patient has been provided with contact information for the care management team and has been advised to call with any health related questions or concerns.   Rodney Langton, RN, MSN, CCM [facility], [facility] RN Care Coordinator Direct Dial: [phone] / Main [phone] Fax [phone] Email: [email or web address]: [email or web address]

## 2023-07-03 NOTE — Patient Outreach (Signed)
  Care Coordination   Follow Up Visit Note   07/03/2023 Name: Tracie Fisher MRN: [id number] DOB: 13-May-1936  Tracie Fisher is a 88 y.o. year old female who sees [place], Corrie Dandy T, NP for primary care. I spoke with  Archie Endo by phone today.  What matters to the patients health and wellness today?  Patient report she is doing well, swelling is much better, denies shortness of breath or chest discomfort.     Goals Addressed             This Visit's Progress    Management of chronic medical conditions   On track    Interventions Today    Flowsheet Row Most Recent Value  Chronic Disease   Chronic disease during today's visit Chronic Obstructive Pulmonary Disease (COPD), Hypertension (HTN), Congestive Heart Failure (CHF)  General Interventions   General Interventions Discussed/Reviewed [facility], General Interventions Reviewed, Doctor Visits  [Patient requesting resources for affordable dentists]  Doctor Visits Discussed/Reviewed Doctor Visits Reviewed, Specialist, PCP, Annual Wellness Visits  [Upcoming: neurology 3/10, Mammogram 3/21, PCP 4/9, eye appointment 3/30]  PCP/Specialist Visits Compliance with follow-up visit  Exercise Interventions   Exercise Discussed/Reviewed Weight Managment  Weight Management Weight maintenance  [Performing daily weights, report stable, today 147]  Education Interventions   Education Provided Provided Education  Provided Verbal Education On Nutrition, Medication, When to see the doctor  [facility] reviewed, taking as instructed. Uses compression socks for swelling as needed.  Has not currently needed her lymphadema pumps, but will use if needed]  Nutrition Interventions   Nutrition Discussed/Reviewed Decreasing fats, Decreasing salt, Fluid intake, Nutrition Reviewed              SDOH assessments and interventions completed:  No     Care Coordination Interventions:  Yes, provided   Follow up plan: Follow up call scheduled for  4/14    Encounter Outcome:  Patient Visit Completed   Rodney Langton, RN, MSN, CCM [place]  [facility], [facility] RN Care Coordinator Direct Dial: [phone] / Main [phone] Fax [phone] Email: [email or web address]

## 2023-07-17 ENCOUNTER — Encounter: Payer: Self-pay | Admitting: Oncology

## 2023-07-21 DIAGNOSIS — M6281 Muscle weakness (generalized): Secondary | ICD-10-CM | POA: Diagnosis not present

## 2023-07-21 DIAGNOSIS — Z9181 History of falling: Secondary | ICD-10-CM | POA: Diagnosis not present

## 2023-07-21 DIAGNOSIS — R262 Difficulty in walking, not elsewhere classified: Secondary | ICD-10-CM | POA: Diagnosis not present

## 2023-07-21 DIAGNOSIS — R2689 Other abnormalities of gait and mobility: Secondary | ICD-10-CM | POA: Diagnosis not present

## 2023-07-21 DIAGNOSIS — I10 Essential (primary) hypertension: Secondary | ICD-10-CM | POA: Diagnosis not present

## 2023-07-24 DIAGNOSIS — R208 Other disturbances of skin sensation: Secondary | ICD-10-CM | POA: Diagnosis not present

## 2023-07-24 DIAGNOSIS — R278 Other lack of coordination: Secondary | ICD-10-CM | POA: Diagnosis not present

## 2023-07-24 DIAGNOSIS — M7918 Myalgia, other site: Secondary | ICD-10-CM | POA: Diagnosis not present

## 2023-07-24 DIAGNOSIS — M5481 Occipital neuralgia: Secondary | ICD-10-CM | POA: Diagnosis not present

## 2023-07-24 DIAGNOSIS — G629 Polyneuropathy, unspecified: Secondary | ICD-10-CM | POA: Diagnosis not present

## 2023-07-24 DIAGNOSIS — Z8673 Personal history of transient ischemic attack (TIA), and cerebral infarction without residual deficits: Secondary | ICD-10-CM | POA: Diagnosis not present

## 2023-07-27 DIAGNOSIS — D485 Neoplasm of uncertain behavior of skin: Secondary | ICD-10-CM | POA: Diagnosis not present

## 2023-08-04 ENCOUNTER — Ambulatory Visit
Admission: RE | Admit: 2023-08-04 | Discharge: 2023-08-04 | Disposition: A | Discharge status: Home / Self Care | Payer: 59 | Source: Ambulatory Visit | Attending: Nurse Practitioner | Admitting: Nurse Practitioner

## 2023-08-04 DIAGNOSIS — Z1231 Encounter for screening mammogram for malignant neoplasm of breast: Secondary | ICD-10-CM | POA: Insufficient documentation

## 2023-08-14 NOTE — Patient Instructions (Signed)

## 2023-08-21 DIAGNOSIS — R2689 Other abnormalities of gait and mobility: Secondary | ICD-10-CM | POA: Diagnosis not present

## 2023-08-21 DIAGNOSIS — M6281 Muscle weakness (generalized): Secondary | ICD-10-CM | POA: Diagnosis not present

## 2023-08-21 DIAGNOSIS — R262 Difficulty in walking, not elsewhere classified: Secondary | ICD-10-CM | POA: Diagnosis not present

## 2023-08-21 DIAGNOSIS — I10 Essential (primary) hypertension: Secondary | ICD-10-CM | POA: Diagnosis not present

## 2023-08-21 DIAGNOSIS — Z9181 History of falling: Secondary | ICD-10-CM | POA: Diagnosis not present

## 2023-08-23 ENCOUNTER — Telehealth: Payer: Self-pay

## 2023-08-23 ENCOUNTER — Encounter: Payer: Self-pay | Admitting: Nurse Practitioner

## 2023-08-23 ENCOUNTER — Ambulatory Visit: Payer: Self-pay | Admitting: Nurse Practitioner

## 2023-08-23 VITALS — BP 136/81 | HR 69 | Temp 98.2°F | Ht 61.0 in | Wt 152.6 lb

## 2023-08-23 DIAGNOSIS — N184 Chronic kidney disease, stage 4 (severe): Secondary | ICD-10-CM | POA: Diagnosis not present

## 2023-08-23 DIAGNOSIS — E039 Hypothyroidism, unspecified: Secondary | ICD-10-CM

## 2023-08-23 DIAGNOSIS — D692 Other nonthrombocytopenic purpura: Secondary | ICD-10-CM

## 2023-08-23 DIAGNOSIS — Z79899 Other long term (current) drug therapy: Secondary | ICD-10-CM | POA: Insufficient documentation

## 2023-08-23 DIAGNOSIS — I272 Pulmonary hypertension, unspecified: Secondary | ICD-10-CM

## 2023-08-23 DIAGNOSIS — D631 Anemia in chronic kidney disease: Secondary | ICD-10-CM

## 2023-08-23 DIAGNOSIS — I10 Essential (primary) hypertension: Secondary | ICD-10-CM | POA: Diagnosis not present

## 2023-08-23 DIAGNOSIS — E782 Mixed hyperlipidemia: Secondary | ICD-10-CM

## 2023-08-23 DIAGNOSIS — M5481 Occipital neuralgia: Secondary | ICD-10-CM

## 2023-08-23 DIAGNOSIS — E663 Overweight: Secondary | ICD-10-CM

## 2023-08-23 DIAGNOSIS — F5101 Primary insomnia: Secondary | ICD-10-CM | POA: Diagnosis not present

## 2023-08-23 MED ORDER — PREGABALIN 75 MG PO CAPS: 75.0000 mg | ORAL_CAPSULE | Freq: Three times a day (TID) | ORAL | 1 refills | Status: DC

## 2023-08-23 MED ORDER — ALPRAZOLAM 1 MG PO TABS: ORAL_TABLET | ORAL | 2 refills | Status: DC

## 2023-08-23 NOTE — Assessment & Plan Note (Signed)
Chronic, ongoing.  Continue current medication regimen and adjust as needed based on labs.  TSH, Free T4 today.

## 2023-08-23 NOTE — Assessment & Plan Note (Signed)
 Chronic, ongoing.  Followed by cardiology, continue current medication regimen as prescribed by them and collaboration.  Recent notes reviewed. Would benefit return to pulmonary in future.

## 2023-08-23 NOTE — Telephone Encounter (Unsigned)
 Copied from CRM [id number]. Topic: General - Other >> Aug 23, 2023  2:32 PM [facility] wrote: Reason for CRM: Pt states she was referred to neurology in [place] today but she lost the paper with that information on it and she doesn't know any of it. She wants to know if a nurse can call and give her that information again so that she could write it down. I did not see in referral orders in orders or appt desk

## 2023-08-23 NOTE — Assessment & Plan Note (Signed)
 Chronic, ongoing.  Followed by nephrology.  Currently CKD 4.  Educated her on diet and fluid intake + current medications. Urine ALB <20 May 2023.  Recommend continue all current medications and wear compression hose at home on during day and off at night.  Labs up to date with nephrology.

## 2023-08-23 NOTE — Addendum Note (Signed)
 Addended by: Aura Dials T on: 08/23/2023 03:32 PM   Modules accepted: Orders

## 2023-08-23 NOTE — Assessment & Plan Note (Signed)
 Chronic, improving. Followed by hematology, continue this collaboration and IV iron as prescribed by them.  Recent notes reviewed.

## 2023-08-23 NOTE — Assessment & Plan Note (Signed)
 Chronic, ongoing for years.  Previous PCP, Dr. Arlana Pouch, placed her on Xanax many years ago and she still takes, reports she cannot sleep without it.  Refuses to try anything else. At this time will continue current regimen and adjust if possible.  Aware of need for every 3 month visits. Controlled substance agreement at next visit and UDS up to date (due next 08/22/24).  Return in 3 months.

## 2023-08-23 NOTE — Assessment & Plan Note (Signed)
 BMI 28.83.  Recommended eating smaller high protein, low fat meals more frequently and exercising 30 mins a day 5 times a week with a goal of 10-15lb weight loss in the next 3 months. Patient voiced their understanding and motivation to adhere to these recommendations.

## 2023-08-23 NOTE — Assessment & Plan Note (Signed)
Chronic, ongoing.  Continues to be followed by neurology.  Will continue this collaboration and medication regimen as ordered by them.  Recent note reviewed.

## 2023-08-23 NOTE — Addendum Note (Signed)
 Addended by: Aura Dials T on: 08/23/2023 12:17 PM   Modules accepted: Level of Service

## 2023-08-23 NOTE — Assessment & Plan Note (Signed)
 Chronic, stable.  BP well below goal for her age.  Recommend she monitor BP at least a few mornings a week at home and document.  DASH diet at home.  Continue Torsemide only.  Collaboration with cardiology - recent note reviewed.  Labs today: up to date.  Urine ALB < 20 May 2023.

## 2023-08-23 NOTE — Assessment & Plan Note (Signed)
Noted to upper and lower extremities.  Recommended to patient gentle cleansing daily with soap and water + application of lotion.  Monitor closely for skin breakdown or wounds, immediately notify provider if present. 

## 2023-08-23 NOTE — Assessment & Plan Note (Signed)
 Chronic, ongoing.  Continue current medication regimen and adjust as needed.  Labs today.

## 2023-08-23 NOTE — Assessment & Plan Note (Signed)
 Refer to primary insomnia plan of care.

## 2023-08-23 NOTE — Progress Notes (Signed)
 BP 136/81   Pulse 69   Temp 98.2 F (36.8 C) (Oral)   Ht 5\' 1"  (1.549 m)   Wt 152 lb 9.6 oz (69.2 kg)   SpO2 97%   BMI 28.83 kg/m    Subjective:    Patient ID: Tracie Fisher, female    DOB: February 02, 1936, 88 y.o.   MRN: [id number]  HPI: Tracie Fisher is a 88 y.o. female  Chief Complaint  Patient presents with   Chronic Kidney Disease   Hyperlipidemia   Hypertension   Insomnia   CHRONIC KIDNEY DISEASE Saw nephrology last 06/05/23 and hematology 06/15/23, no IV iron this time.   CKD status: stable Medications renally dose: yes Previous renal evaluation: yes Pneumovax:  Up to Date Influenza Vaccine:  Up to Date  HYPERTENSION / HYPERLIPIDEMIA Takes Metolazone, Plavix, Torsemide, + Pravastatin. Mild pulmonary hypertension. Follows with cardiology, Dr. Juliann Pares, last 05/01/23. Echo March 2024 65-70%. Saw vascular 03/27/23, uses pumps for lymphedema which has improved edema.   History of stroke years ago and last stroke on 08/04/22. Neurology visit last 07/24/23, receives trigger point injections for occipital neuralgia.  They recommended Botox injections next, but these were denied. Continues Lyrica 75 MG TID for peripheral neuropathy. She feels her headaches are not migraines and is going to see Dr. Epimenio Foot in [place]. Satisfied with current treatment? yes Duration of hypertension: chronic BP monitoring frequency: daily BP range: 120/70 range at home BP medication side effects: no Duration of hyperlipidemia: chronic Cholesterol medication side effects: no Cholesterol supplements: none Medication compliance: good compliance Aspirin: no Recent stressors: no Recurrent headaches: no Visual changes: no Palpitations: no Dyspnea: occasionally a little bit Chest pain: no Lower extremity edema: at baseline, can not put on compression hose Dizzy/lightheaded: no     09/13/2022   11:20 AM 07/15/2022   11:31 AM  6CIT Screen  What Year? 0 points 0 points  What month? 0 points 0 points   What time? 0 points 0 points  Count back from 20 4 points 0 points  Months in reverse 4 points 4 points  Repeat phrase 6 points 8 points  Total Score 14 points 12 points      02/03/2023    1:49 PM  MMSE - Mini Mental State Exam  Orientation to time 5  Orientation to Place 5  Registration 3  Attention/ Calculation 5  Recall 3  Language- name 2 objects 2  Language- repeat 1  Language- follow 3 step command 3  Language- read & follow direction 1  Write a sentence 1  Copy design 1  Total score 30    INSOMNIA Has been on Xanax for many years, Dr. Arlana Pouch, her previous PCP, started years ago. Wishes to remain on it -- takes 1/2 tablet Xanax in morning and 1 tablet at night. Pt is aware of risks of psychoactive medication use to include increased sedation, respiratory suppression, falls, extrapyramidal movements, dependence and cardiovascular events.  Pt would like to continue treatment as benefit determined to outweigh risk.  Last filled on 07/26/23. Duration: chronic Satisfied with sleep quality: yes Difficulty falling asleep: yes Difficulty staying asleep: yes Waking a few hours after sleep onset: no Early morning awakenings: yes Daytime hypersomnolence: no Wakes feeling refreshed: yes Good sleep hygiene: yes Apnea: no Snoring: no Depressed/anxious mood: yes Recent stress: no Restless legs/nocturnal leg cramps: no Chronic pain/arthritis: yes  HYPOTHYROIDISM Thyroid control status:stable Satisfied with current treatment? yes Medication side effects: no Medication compliance: good compliance Etiology of hypothyroidism:  unknown Recent dose adjustment:no Fatigue: no Cold intolerance: no Heat intolerance: no Weight gain: no Weight loss: no Constipation: no Diarrhea/loose stools: no Palpitations: no Lower extremity edema: at baseline Anxiety/depressed mood: no   Relevant past medical, surgical, family and social history reviewed and updated as indicated. Interim medical  history since our last visit reviewed. Allergies and medications reviewed and updated.  Review of Systems  Constitutional:  Negative for activity change, appetite change, diaphoresis, fatigue and fever.  Respiratory:  Negative for cough, chest tightness and shortness of breath.   Cardiovascular:  Negative for chest pain, palpitations and leg swelling.  Gastrointestinal: Negative.   Neurological: Negative.   Psychiatric/Behavioral:  Negative for decreased concentration, self-injury, sleep disturbance and suicidal ideas.    Per HPI unless specifically indicated above     Objective:    BP 136/81   Pulse 69   Temp 98.2 F (36.8 C) (Oral)   Ht 5\' 1"  (1.549 m)   Wt 152 lb 9.6 oz (69.2 kg)   SpO2 97%   BMI 28.83 kg/m   Wt Readings from Last 3 Encounters:  08/23/23 152 lb 9.6 oz (69.2 kg)  06/15/23 147 lb (66.7 kg)  05/25/23 146 lb (66.2 kg)    Physical Exam Vitals and nursing note reviewed.  Constitutional:      General: She is awake. She is not in acute distress.    Appearance: She is well-developed and well-groomed. She is not ill-appearing or toxic-appearing.  HENT:     Head: Normocephalic.     Right Ear: Hearing and external ear normal.     Left Ear: Hearing and external ear normal.  Eyes:     General: Lids are normal.        Right eye: No discharge.        Left eye: No discharge.     Conjunctiva/sclera: Conjunctivae normal.     Pupils: Pupils are equal, round, and reactive to light.  Neck:     Thyroid: No thyromegaly.     Vascular: No carotid bruit.  Cardiovascular:     Rate and Rhythm: Normal rate and regular rhythm.     Heart sounds: Normal heart sounds. No murmur heard.    No gallop.  Pulmonary:     Effort: Pulmonary effort is normal. No accessory muscle usage or respiratory distress.     Breath sounds: Normal breath sounds.  Abdominal:     General: Bowel sounds are normal. There is no distension.     Palpations: Abdomen is soft.     Tenderness: There is no  abdominal tenderness.  Musculoskeletal:     Cervical back: Normal range of motion and neck supple.     Right lower leg: No edema.     Left lower leg: No edema.  Lymphadenopathy:     Cervical: No cervical adenopathy.  Skin:    General: Skin is warm and dry.     Findings: Bruising present.     Comments: Bilateral upper extremities with bruising scattered.  Neurological:     Mental Status: She is alert and oriented to person, place, and time.     Deep Tendon Reflexes: Reflexes are normal and symmetric.     Reflex Scores:      Brachioradialis reflexes are 2+ on the right side and 2+ on the left side.      Patellar reflexes are 2+ on the right side and 2+ on the left side. Psychiatric:        Attention and Perception: Attention  normal.        Mood and Affect: Mood normal.        Speech: Speech normal.        Behavior: Behavior normal. Behavior is cooperative.        Thought Content: Thought content normal.    Results for orders placed or performed in visit on 06/14/23  Iron and TIBC([facility])   Collection Time: 06/14/23 10:45 AM  Result Value Ref Range   Iron 75 28 - 170 ug/dL   TIBC 956 213 - 086 ug/dL   Saturation Ratios 18 10.4 - 31.8 %   UIBC 334 ug/dL  Ferritin   Collection Time: 06/14/23 10:45 AM  Result Value Ref Range   Ferritin 36 11 - 307 ng/mL  CBC with Differential/Platelet   Collection Time: 06/14/23 10:45 AM  Result Value Ref Range   WBC 6.5 4.0 - 10.5 K/uL   RBC 3.96 3.87 - 5.11 MIL/uL   Hemoglobin 12.4 12.0 - 15.0 g/dL   HCT 57.8 46.9 - 62.9 %   MCV 97.7 80.0 - 100.0 fL   MCH 31.3 26.0 - 34.0 pg   MCHC 32.0 30.0 - 36.0 g/dL   RDW 52.8 41.3 - 24.4 %   Platelets 286 150 - 400 K/uL   nRBC 0.0 0.0 - 0.2 %   Neutrophils Relative % 53 %   Neutro Abs 3.5 1.7 - 7.7 K/uL   Lymphocytes Relative 32 %   Lymphs Abs 2.1 0.7 - 4.0 K/uL   Monocytes Relative 10 %   Monocytes Absolute 0.6 0.1 - 1.0 K/uL   Eosinophils Relative 3 %   Eosinophils Absolute 0.2  0.0 - 0.5 K/uL   Basophils Relative 1 %   Basophils Absolute 0.0 0.0 - 0.1 K/uL   Immature Granulocytes 1 %   Abs Immature Granulocytes 0.04 0.00 - 0.07 K/uL      Assessment & Plan:   Problem List Items Addressed This Visit       Cardiovascular and Mediastinum   Essential hypertension   Chronic, stable.  BP well below goal for her age.  Recommend she monitor BP at least a few mornings a week at home and document.  DASH diet at home.  Continue Torsemide only.  Collaboration with cardiology - recent note reviewed.  Labs today: up to date.  Urine ALB < 20 May 2023.       Moderate pulmonary hypertension (HCC) - Primary   Chronic, ongoing.  Followed by cardiology, continue current medication regimen as prescribed by them and collaboration.  Recent notes reviewed. Would benefit return to pulmonary in future.      Senile purpura (HCC)   Noted to upper and lower extremities.  Recommended to patient gentle cleansing daily with soap and water + application of lotion.  Monitor closely for skin breakdown or wounds, immediately notify provider if present.        Endocrine   Hypothyroidism, adult   Chronic, ongoing.  Continue current medication regimen and adjust as needed based on labs.  TSH, Free T4 today.      Relevant Orders   T4, free   TSH     Nervous and Auditory   Occipital neuralgia   Chronic, ongoing.  Continues to be followed by neurology.  Will continue this collaboration and medication regimen as ordered by them.  Recent note reviewed.      Relevant Medications   ALPRAZolam (XANAX) 1 MG tablet   pregabalin (LYRICA) 75 MG capsule  Genitourinary   CKD (chronic kidney disease) stage 4, GFR 15-29 ml/min (HCC)   Chronic, ongoing.  Followed by nephrology.  Currently CKD 4.  Educated her on diet and fluid intake + current medications. Urine ALB <20 May 2023.  Recommend continue all current medications and wear compression hose at home on during day and off at night.   Labs up to date with nephrology.        Other   Anemia due to stage 4 chronic kidney disease (HCC)   Chronic, improving. Followed by hematology, continue this collaboration and IV iron as prescribed by them.  Recent notes reviewed.      Insomnia   Chronic, ongoing for years.  Previous PCP, Dr. Arlana Pouch, placed her on Xanax many years ago and she still takes, reports she cannot sleep without it.  Refuses to try anything else. At this time will continue current regimen and adjust if possible.  Aware of need for every 3 month visits. Controlled substance agreement at next visit and UDS up to date (due next 08/22/24).  Return in 3 months.      Relevant Medications   ALPRAZolam (XANAX) 1 MG tablet   Other Relevant Orders   [id number] 11+Oxyco+Alc+Crt-Bund   Long-term current use of benzodiazepine   Refer to primary insomnia plan of care.      Relevant Orders   [id number] 11+Oxyco+Alc+Crt-Bund   Mixed hyperlipidemia   Chronic, ongoing.  Continue current medication regimen and adjust as needed.  Labs today.      Relevant Orders   Lipid Panel w/o Chol/HDL Ratio   Overweight (BMI 25.0-29.9)   BMI 28.83.  Recommended eating smaller high protein, low fat meals more frequently and exercising 30 mins a day 5 times a week with a goal of 10-15lb weight loss in the next 3 months. Patient voiced their understanding and motivation to adhere to these recommendations.       Other Visit Diagnoses       High risk medication use       UDs today per protocol for yearly check.   Relevant Orders   [id number] 11+Oxyco+Alc+Crt-Bund         Follow up plan: Return in about 3 months (around 11/22/2023) for HTN/HLD, INSOMNIA, CKD, HEADACHES.

## 2023-08-24 LAB — LIPID PANEL W/O CHOL/HDL RATIO
Cholesterol, Total: 206 mg/dL — ABNORMAL HIGH (ref 100–199)
HDL: 77 mg/dL (ref >39)
LDL Chol Calc (NIH): 105 mg/dL — ABNORMAL HIGH (ref 0–99)
Triglycerides: 142 mg/dL (ref 0–149)
VLDL Cholesterol Cal: 24 mg/dL (ref 5–40)

## 2023-08-24 LAB — T4, FREE: Free T4: 1.27 ng/dL (ref 0.82–1.77)

## 2023-08-24 LAB — TSH: TSH: 1.41 u[IU]/mL (ref 0.450–4.500)

## 2023-08-24 NOTE — Telephone Encounter (Signed)
 Jolene were you referring this patient to Neuro? I dont see a referral.

## 2023-08-24 NOTE — Progress Notes (Signed)
 Good morning, please let Teale know her labs have returned.  Thyroid levels are normal, no Levothyroxine dose changes needed, continue current dosing.  Lipid panel shows trend up in levels this check from her baseline, I recommend she ensure she is taking her Pravastatin daily for stroke prevention.  This should be with her medications and if she needs more let us know.  Any questions? Keep being stellar!!  Thank you for allowing me to participate in your care.  I appreciate you. Kindest regards, Erian Lariviere

## 2023-08-25 LAB — PMP SCREEN PROFILE (10S), URINE
Amphetamine Scrn, Ur: NEGATIVE ng/mL
BARBITURATE SCREEN URINE: NEGATIVE ng/mL
BENZODIAZEPINE SCREEN, URINE: POSITIVE ng/mL — AB
CANNABINOIDS UR QL SCN: NEGATIVE ng/mL
Cocaine (Metab) Scrn, Ur: NEGATIVE ng/mL
Creatinine(Crt), U: 16.8 mg/dL — ABNORMAL LOW (ref 20.0–300.0)
Methadone Screen, Urine: NEGATIVE ng/mL
OXYCODONE+OXYMORPHONE UR QL SCN: NEGATIVE ng/mL
Opiate Scrn, Ur: NEGATIVE ng/mL
Ph of Urine: 5.3 (ref 4.5–8.9)
Phencyclidine Qn, Ur: NEGATIVE ng/mL
Propoxyphene Scrn, Ur: NEGATIVE ng/mL

## 2023-08-25 LAB — SPECIFIC GRAVITY (REFLEXED): SPECIFIC GRAVITY: 1.0055

## 2023-08-25 NOTE — Telephone Encounter (Signed)
 Called and spoke with patient. She states she has everything she needs now, states she contacted Dr. Daisy Blossom office.

## 2023-08-28 ENCOUNTER — Ambulatory Visit: Payer: 59 | Admitting: *Deleted

## 2023-08-28 NOTE — Patient Outreach (Signed)
 Complex Care Management   Visit Note  08/28/2023  Name:  Tracie Fisher MRN: [id number] DOB: Aug 19, 1935  Situation: Referral received for Complex Care Management related to Heart Failure and COPD I obtained verbal consent from Patient.  Visit completed with patient  on the phone  Patient report doing well, chronic conditions stable.  Still in need of dental resources, will send list.  Denies any urgent concerns, encouraged to contact this care manager with questions.    Background:   Past Medical History:  Diagnosis Date   Cancer (HCC)    SKIN   Complication of anesthesia    Depression    Dysrhythmia    GERD (gastroesophageal reflux disease)    Hiatal hernia    HOH (hard of hearing)    Hypertension    Hypothyroidism    PONV (postoperative nausea and vomiting)    Thyroid disease     Assessment: Patient Reported Symptoms:  Cognitive Cognitive Status: Alert and oriented to person, place, and time, Normal speech and language skills   Health Maintenance Behaviors: Annual physical exam, Healthy diet Healing Pattern: Average Health Facilitated by: Healthy diet, Rest  Neurological      HEENT HEENT Symptoms Reported: No symptoms reported      Cardiovascular Cardiovascular Symptoms Reported: No symptoms reported Does patient have uncontrolled Hypertension?: No Cardiovascular Conditions: Hypertension Cardiovascular Management Strategies: Medication therapy, Diet modification Weight: 151 lb (68.5 kg) (self reported)  Respiratory Respiratory Symptoms Reported: No symptoms reported    Endocrine Patient reports the following symptoms related to hypoglycemia or hyperglycemia : No symptoms reported Is patient diabetic?: No    Gastrointestinal Gastrointestinal Symptoms Reported: No symptoms reported   Nutrition Risk Screen (CP): No indicators present  Genitourinary   Genitourinary Conditions: Chronic kidney disease Genitourinary Management Strategies: Diet modification,  Medication therapy, Fluid modification  Integumentary Integumentary Symptoms Reported: No symptoms reported    Musculoskeletal Musculoskelatal Symptoms Reviewed: No symptoms reported        Psychosocial Psychosocial Symptoms Reported: No symptoms reported   Major Change/Loss/Stressor/Fears (CP): Denies        05/25/2023   11:06 AM  Depression screen PHQ 2/9  Decreased Interest 0  Down, Depressed, Hopeless 0  PHQ - 2 Score 0  Altered sleeping 0  Tired, decreased energy 1  Change in appetite 2  Feeling bad or failure about yourself  0  Trouble concentrating 0  Moving slowly or fidgety/restless 0  Suicidal thoughts 0  PHQ-9 Score 3  Difficult doing work/chores Not difficult at all    There were no vitals filed for this visit.  Medications Reviewed Today     Reviewed by Holland Lundborg, RN (Registered Nurse) on 08/28/23 at 1445  Med List Status: <None>   Medication Order Taking? Sig Documenting Provider Last Dose Status Informant  ALPRAZolam (XANAX) 1 MG tablet [id number] Yes Take 0.5 mg (1/2 tablet) by mouth in the morning and 1 mg (1 tablet) by mouth at bedtime. Cannady, Jolene T, NP Taking Active   Cholecalciferol (VITAMIN D3) 50 MCG (2000 UT) CAPS [id number] Yes Take 2,000 Units by mouth daily. [provider] Taking Active   clopidogrel (PLAVIX) 75 MG tablet [id number] Yes Take 1 tablet (75 mg total) by mouth daily. Cannady, Jolene T, NP Taking Active   cyanocobalamin (V-R VITAMIN B-12) 500 MCG tablet [id number] Yes Take 500 mcg by mouth daily.  [provider] Taking Active Self, Pharmacy Records, Multiple Informants           Med Note Shann Darnel,  KYLE A   Fri Mar 10, 2017 10:36 AM)    ferrous gluconate (FERGON) 240 (27 FE) MG tablet [id number] Yes Take 240 mg by mouth 2 (two) times daily. [provider] Taking Active Self, Pharmacy Records, Multiple Informants  levothyroxine (SYNTHROID) 50 MCG tablet [id number] Yes Take 1 tablet (50 mcg total) by mouth  daily before breakfast. Cannady, Jolene T, NP Taking Active   metolazone (ZAROXOLYN) 2.5 MG tablet [id number] Yes Take 2.5 mg by mouth once a week. [provider] Taking Active   pravastatin (PRAVACHOL) 20 MG tablet [id number] Yes TAKE 1 TABLET BY MOUTH EVERY EVENING Cannady, Jolene T, NP Taking Active   pregabalin (LYRICA) 75 MG capsule [id number] Yes Take 1 capsule (75 mg total) by mouth 3 (three) times daily. Cannady, Jolene T, NP Taking Active   torsemide (DEMADEX) 20 MG tablet [id number] Yes Take 20 mg by mouth daily. [provider] Taking Active             Recommendation:   PCP Follow-up Specialty provider follow-up Eye doctor 4/16, ENT 4/23, AWV 5/6, Vascular 5/12, nephrology 5/19, oncology 6/5, cardiology 6/23, PCP 7/9, Neurology 9/15  Follow Up Plan:   Patient has met all care management goals. Care Management case will be closed. Patient has been provided contact information should new needs arise.  Will mail list of dental options under insurance.  Holland Lundborg, RN, MSN, CCM [facility], [facility] RN Care Coordinator Direct Dial: [phone] / Main [phone] Fax [phone] Email: [email or web address]

## 2023-08-28 NOTE — Patient Instructions (Addendum)
 Visit Information  Thank you for taking time to visit with me today. Please don't hesitate to contact me if I can be of assistance to you before our next scheduled appointment.  See the list of Dental options below:  Caretha Chapel, DDS General Dentist [address] [place], [place] [address]  [phone]   [facility] [address] [place], [place] [address] Specialty: General Dentist Phone: [phone]  [facility] [address] [place], [place] [address] Specialty: General Dentist Phone: [phone]  Melchor, Christophe Cram, DDS General Dentist [phone] phone for [facility], DDS [address] [place], [place] [address]  [facility] [place] [address] [place], [place] [address] [phone]   [facility] Department of Health - [facility] [address] [place], [place] [address] [phone]   [facility] Department of [facility] [address] 2nd floor [place], [place] [address] [phone]   [facility] - Dental [address] [place], [place] [address] [phone]  Patient has met all care management goals. Care Management case will be closed. Patient has been provided contact information should new needs arise.   Please call the care guide team at [phone] if you need to cancel, schedule, or reschedule an appointment.   Please call the Suicide and Crisis Lifeline: 988 call the [place]  National Suicide Prevention Lifeline: (310)160-0276 or TTY: 516-209-5590 TTY (669)053-1887) to talk to a trained counselor call 1-800-273-TALK (toll free, 24 hour hotline) call 911 if you are experiencing a Mental Health or Behavioral Health Crisis or need someone to talk to.  Holland Lundborg, RN, MSN, CCM [facility], [facility] RN Care Coordinator Direct Dial: [phone] / Main [phone] Fax [phone] Email:  [email or web address] Website: [email or web address]

## 2023-08-30 DIAGNOSIS — H43813 Vitreous degeneration, bilateral: Secondary | ICD-10-CM | POA: Diagnosis not present

## 2023-08-30 DIAGNOSIS — H34231 Retinal artery branch occlusion, right eye: Secondary | ICD-10-CM | POA: Diagnosis not present

## 2023-08-30 DIAGNOSIS — D485 Neoplasm of uncertain behavior of skin: Secondary | ICD-10-CM | POA: Diagnosis not present

## 2023-08-30 DIAGNOSIS — Z961 Presence of intraocular lens: Secondary | ICD-10-CM | POA: Diagnosis not present

## 2023-09-06 ENCOUNTER — Telehealth (INDEPENDENT_AMBULATORY_CARE_PROVIDER_SITE_OTHER): Payer: Self-pay

## 2023-09-06 DIAGNOSIS — H6123 Impacted cerumen, bilateral: Secondary | ICD-10-CM | POA: Diagnosis not present

## 2023-09-06 DIAGNOSIS — H6063 Unspecified chronic otitis externa, bilateral: Secondary | ICD-10-CM | POA: Diagnosis not present

## 2023-09-06 DIAGNOSIS — H903 Sensorineural hearing loss, bilateral: Secondary | ICD-10-CM | POA: Diagnosis not present

## 2023-09-06 NOTE — Telephone Encounter (Signed)
 Pt needs help with lymph pump gave number to [employer]

## 2023-09-08 ENCOUNTER — Telehealth (INDEPENDENT_AMBULATORY_CARE_PROVIDER_SITE_OTHER): Payer: Self-pay

## 2023-09-08 NOTE — Telephone Encounter (Signed)
 Pt called again wanting help with her lymph pump machine I called pt back and she had already spoken with the company and got the machine fixed. I let her know if she needs anything from us  to give us  a call she states understanding

## 2023-09-19 ENCOUNTER — Ambulatory Visit: Payer: Self-pay | Admitting: Emergency Medicine

## 2023-09-19 VITALS — Ht 60.0 in | Wt 148.0 lb

## 2023-09-19 DIAGNOSIS — Z0000 Encounter for general adult medical examination without abnormal findings: Secondary | ICD-10-CM | POA: Diagnosis not present

## 2023-09-19 NOTE — Patient Instructions (Addendum)
 Ms. Tracie Fisher , Thank you for taking time to come for your Medicare Wellness Visit. I appreciate your ongoing commitment to your health goals. Please review the following plan we discussed and let me know if I can assist you in the future.   Referrals/Orders/Follow-Ups/Clinician Recommendations: Your next mammogram will be due 08/04/24.  This is a list of the screening recommended for you and due dates:  Health Maintenance  Topic Date Due   COVID-19 Vaccine (2 - Pfizer risk series) 03/10/2023   Zoster (Shingles) Vaccine (1 of 2) 11/22/2023*   Pneumonia Vaccine (1 of 2 - PCV) 01/02/2024*   Flu Shot  12/15/2023   Mammogram  08/03/2024   Medicare Annual Wellness Visit  09/18/2024   DEXA scan (bone density measurement)  08/03/2027   DTaP/Tdap/Td vaccine (2 - Tdap) 10/25/2032   HPV Vaccine  Aged Out   Meningitis B Vaccine  Aged Out  *Topic was postponed. The date shown is not the original due date.    Advanced directives: (Declined) Advance directive discussed with you today. Even though you declined this today, please call our office should you change your mind, and we can give you the proper paperwork for you to fill out.  Next Medicare Annual Wellness Visit scheduled for next year: Yes, 09/24/24 @ 9:20am (phone visit)  Have you seen your provider in the last 6 months (3 months if uncontrolled diabetes)? Yes  Fall Prevention in the Home, Adult Falls can cause injuries and affect people of all ages. There are many simple things that you can do to make your home safe and to help prevent falls. If you need it, ask for help making these changes. What actions can I take to prevent falls? General information Use good lighting in all rooms. Make sure to: Replace any light bulbs that burn out. Turn on lights if it is dark and use night-lights. Keep items that you use often in easy-to-reach places. Lower the shelves around your home if needed. Move furniture so that there are clear paths around  it. Do not keep throw rugs or other things on the floor that can make you trip. If any of your floors are uneven, fix them. Add color or contrast paint or tape to clearly mark and help you see: Grab bars or handrails. First and last steps of staircases. Where the edge of each step is. If you use a ladder or stepladder: Make sure that it is fully opened. Do not climb a closed ladder. Make sure the sides of the ladder are locked in place. Have someone hold the ladder while you use it. Know where your pets are as you move through your home. What can I do in the bathroom?     Keep the floor dry. Clean up any water that is on the floor right away. Remove soap buildup in the bathtub or shower. Buildup makes bathtubs and showers slippery. Use non-skid mats or decals on the floor of the bathtub or shower. Attach bath mats securely with double-sided, non-slip rug tape. If you need to sit down while you are in the shower, use a non-slip stool. Install grab bars by the toilet and in the bathtub and shower. Do not use towel bars as grab bars. What can I do in the bedroom? Make sure that you have a light by your bed that is easy to reach. Do not use any sheets or blankets on your bed that hang to the floor. Have a firm bench or chair with  side arms that you can use for support when you get dressed. What can I do in the kitchen? Clean up any spills right away. If you need to reach something above you, use a sturdy step stool that has a grab bar. Keep electrical cables out of the way. Do not use floor polish or wax that makes floors slippery. What can I do with my stairs? Do not leave anything on the stairs. Make sure that you have a light switch at the top and the bottom of the stairs. Have them installed if you do not have them. Make sure that there are handrails on both sides of the stairs. Fix handrails that are broken or loose. Make sure that handrails are as long as the staircases. Install  non-slip stair treads on all stairs in your home if they do not have carpet. Avoid having throw rugs at the top or bottom of stairs, or secure the rugs with carpet tape to prevent them from moving. Choose a carpet design that does not hide the edge of steps on the stairs. Make sure that carpet is firmly attached to the stairs. Fix any carpet that is loose or worn. What can I do on the outside of my home? Use bright outdoor lighting. Repair the edges of walkways and driveways and fix any cracks. Clear paths of anything that can make you trip, such as tools or rocks. Add color or contrast paint or tape to clearly mark and help you see high doorway thresholds. Trim any bushes or trees on the main path into your home. Check that handrails are securely fastened and in good repair. Both sides of all steps should have handrails. Install guardrails along the edges of any raised decks or porches. Have leaves, snow, and ice cleared regularly. Use sand, salt, or ice melt on walkways during winter months if you live where there is ice and snow. In the garage, clean up any spills right away, including grease or oil spills. What other actions can I take? Review your medicines with your health care provider. Some medicines can make you confused or feel dizzy. This can increase your chance of falling. Wear closed-toe shoes that fit well and support your feet. Wear shoes that have rubber soles and low heels. Use a cane, walker, scooter, or crutches that help you move around if needed. Talk with your provider about other ways that you can decrease your risk of falls. This may include seeing a physical therapist to learn to do exercises to improve movement and strength. Where to find more information Centers for Disease Control and Prevention, STEADI: TonerPromos.no [employer] on Aging: BaseRingTones.pl National Institute on Aging: BaseRingTones.pl Contact a health care provider if: You are afraid of falling at home. You  feel weak, drowsy, or dizzy at home. You fall at home. Get help right away if you: Lose consciousness or have trouble moving after a fall. Have a fall that causes a head injury. These symptoms may be an emergency. Get help right away. Call 911. Do not wait to see if the symptoms will go away. Do not drive yourself to the hospital. This information is not intended to replace advice given to you by your health care provider. Make sure you discuss any questions you have with your health care provider. Document Revised: 01/03/2022 Document Reviewed: 01/03/2022 Elsevier Patient Education  2024 [employer].

## 2023-09-19 NOTE — Progress Notes (Signed)
 Subjective:   Tracie Fisher is a 88 y.o. who presents for a Medicare Wellness preventive visit.  Visit Complete: Virtual I connected with  MACII ARD on 09/19/23 by a audio enabled telemedicine application and verified that I am speaking with the correct person using two identifiers.  Patient Location: Home  Provider Location: Home Office  I discussed the limitations of evaluation and management by telemedicine. The patient expressed understanding and agreed to proceed.  Vital Signs: Because this visit was a virtual/telehealth visit, some criteria may be missing or patient reported. Any vitals not documented were not able to be obtained and vitals that have been documented are patient reported.  VideoDeclined- This patient declined Librarian, academic. Therefore the visit was completed with audio only.  Persons Participating in Visit: Patient.  AWV Questionnaire: No: Patient Medicare AWV questionnaire was not completed prior to this visit.  Cardiac Risk Factors include: advanced age (>79men, >64 women);dyslipidemia;hypertension     Objective:    Today's Vitals   09/19/23 1005  Weight: 148 lb (67.1 kg)  Height: 5' (1.524 m)   Body mass index is 28.9 kg/m.     09/19/2023   10:25 AM 06/15/2023    1:55 PM 03/20/2023    1:41 PM 03/06/2023   11:34 AM 11/25/2022    1:00 PM 11/24/2022    4:06 PM 09/13/2022   11:14 AM  Advanced Directives  Does Patient Have a Medical Advance Directive? No No No No No No No  Does patient want to make changes to medical advance directive?  No - Patient declined No - Patient declined      Would patient like information on creating a medical advance directive? No - Patient declined  No - Patient declined No - Patient declined No - Patient declined  No - Patient declined    Current Medications (verified) Outpatient Encounter Medications as of 09/19/2023  Medication Sig   acetaminophen  (TYLENOL ) 325 MG tablet Take 650  mg by mouth every 6 (six) hours as needed.   ALPRAZolam  (XANAX ) 1 MG tablet Take 0.5 mg (1/2 tablet) by mouth in the morning and 1 mg (1 tablet) by mouth at bedtime.   Cholecalciferol (VITAMIN D3) 50 MCG (2000 UT) CAPS Take 2,000 Units by mouth daily.   clopidogrel  (PLAVIX ) 75 MG tablet Take 1 tablet (75 mg total) by mouth daily.   cyanocobalamin  (V-R VITAMIN B-12) 500 MCG tablet Take 500 mcg by mouth daily.    ferrous gluconate  (FERGON) 240 (27 FE) MG tablet Take 240 mg by mouth 2 (two) times daily.   levothyroxine  (SYNTHROID ) 50 MCG tablet Take 1 tablet (50 mcg total) by mouth daily before breakfast.   metolazone (ZAROXOLYN) 2.5 MG tablet Take 2.5 mg by mouth once a week.   pravastatin  (PRAVACHOL ) 20 MG tablet TAKE 1 TABLET BY MOUTH EVERY EVENING   pregabalin  (LYRICA ) 75 MG capsule Take 1 capsule (75 mg total) by mouth 3 (three) times daily.   torsemide  (DEMADEX ) 20 MG tablet Take 20 mg by mouth daily.   No facility-administered encounter medications on file as of 09/19/2023.    Allergies (verified) Aspirin, Penicillins, and Penicillin g   History: Past Medical History:  Diagnosis Date   Cancer (HCC)    SKIN   Complication of anesthesia    Depression    Dysrhythmia    GERD (gastroesophageal reflux disease)    Hiatal hernia    HOH (hard of hearing)    Hypertension    Hypothyroidism  PONV (postoperative nausea and vomiting)    Thyroid  disease    Past Surgical History:  Procedure Laterality Date   APPENDECTOMY     BACK SURGERY     MULTIPLE/ROD IN BACK   BREAST CYST ASPIRATION Left "years ago"   CATARACT EXTRACTION W/PHACO Left 03/21/2017   Procedure: CATARACT EXTRACTION PHACO AND INTRAOCULAR LENS PLACEMENT (IOC);  Surgeon: Clair Crews, MD;  Location: [facility] OR[facility];  Service: Ophthalmology;  Laterality: Left;  US  00:48 AP% 17.8 CDE 8.57 Fluid pack lot # [id number]   CATARACT EXTRACTION W/PHACO Right 04/11/2017   Procedure: CATARACT EXTRACTION PHACO AND INTRAOCULAR LENS  PLACEMENT (IOC);  Surgeon: Clair Crews, MD;  Location: [facility];  Service: Ophthalmology;  Laterality: Right;  US  01:09.5 AP% 15.8 CDE 11.04 Fluid Pack lot # [id number]   CHOLECYSTECTOMY     COLON SURGERY     BOWEL OBSTRUCTION   COLONOSCOPY WITH PROPOFOL  N/A 04/25/2016   Procedure: COLONOSCOPY WITH PROPOFOL ;  Surgeon: Luke Salaam, MD;  Location: [facility];  Service: Endoscopy;  Laterality: N/A;   ESOPHAGOGASTRODUODENOSCOPY (EGD) WITH PROPOFOL  N/A 04/25/2016   Procedure: ESOPHAGOGASTRODUODENOSCOPY (EGD) WITH PROPOFOL ;  Surgeon: Luke Salaam, MD;  Location: [facility];  Service: Endoscopy;  Laterality: N/A;   EXPLORATORY LAPAROTOMY  10/18/2011   blockage small intestines   GIVENS CAPSULE STUDY N/A 05/11/2016   Procedure: GIVENS CAPSULE STUDY;  Surgeon: Luke Salaam, MD;  Location: [facility];  Service: Endoscopy;  Laterality: N/A;   Family History  Problem Relation Age of Onset   Other Mother        unknown medical history   Heart disease Father    Breast cancer Sister 11   Heart attack Brother    Social History   Socioeconomic History   Marital status: Divorced    Spouse name: Not on file   Number of children: 2   Years of education: Not on file   Highest education level: Not on file  Occupational History   Occupation: retired  Tobacco Use   Smoking status: Former    Current packs/day: 0.00    Average packs/day: 2.0 packs/day for 35.0 years (70.0 ttl pk-yrs)    Types: Cigarettes    Start date: 64    Quit date: 1990    Years since quitting: 35.3   Smokeless tobacco: Never  Vaping Use   Vaping status: Never Used  Substance and Sexual Activity   Alcohol use: Not Currently    Comment: 1 beer /day or 2 occ, last use ~2023   Drug use: No   Sexual activity: Yes    Birth control/protection: Post-menopausal  Other Topics Concern   Not on file  Social History Narrative   Not on file   Social Drivers of Health   Financial Resource Strain: Low Risk   (09/19/2023)   Overall Financial Resource Strain (CARDIA)    Difficulty of Paying Living Expenses: Not hard at all  Food Insecurity: No Food Insecurity (09/19/2023)   Hunger Vital Sign    Worried About Running Out of Food in the Last Year: Never true    Ran Out of Food in the Last Year: Never true  Transportation Needs: No Transportation Needs (09/19/2023)   PRAPARE - Administrator, Civil Service (Medical): No    Lack of Transportation (Non-Medical): No  Physical Activity: Inactive (09/19/2023)   Exercise Vital Sign    Days of Exercise per Week: 0 days    Minutes of Exercise per Session: 0 min  Stress: No Stress Concern  Present (09/19/2023)   [employer] of Occupational Health - Occupational Stress Questionnaire    Feeling of Stress : Not at all  Social Connections: Socially Isolated (09/19/2023)   Social Connection and Isolation Panel [NHANES]    Frequency of Communication with Friends and Family: More than three times a week    Frequency of Social Gatherings with Friends and Family: More than three times a week    Attends Religious Services: Never    Database administrator or Organizations: No    Attends Engineer, structural: Never    Marital Status: Divorced    Tobacco Counseling Counseling given: Not Answered    Clinical Intake:  Pre-visit preparation completed: Yes  Pain : No/denies pain     BMI - recorded: 28.9 Nutritional Status: BMI 25 -29 Overweight Nutritional Risks: None Diabetes: No  Lab Results  Component Value Date   HGBA1C 5.6 08/04/2022     How often do you need to have someone help you when you read instructions, pamphlets, or other written materials from your doctor or pharmacy?: 1 - Never  Interpreter Needed?: No  Information entered by :: Jaunita Messier, CMA   Activities of Daily Living     09/19/2023   10:09 AM 11/25/2022    1:00 PM  In your present state of health, do you have any difficulty performing the following  activities:  Hearing? 1 0  Comment has hearing aids, but doesn't wear   Vision? 0 0  Difficulty concentrating or making decisions? 0 0  Walking or climbing stairs? 1 0  Comment uses rollator   Dressing or bathing? 0 0  Doing errands, shopping? 0 0  Preparing Food and eating ? N   Using the Toilet? N   In the past six months, have you accidently leaked urine? Y   Comment wears pad   Do you have problems with loss of bowel control? N   Managing your Medications? N   Managing your Finances? N   Housekeeping or managing your Housekeeping? N     Patient Care Team: Cannady, Jolene T, NP as PCP - General (Nurse Practitioner) Shellie Dials, MD as Consulting Physician (Oncology) Rica Chalet, MD as Consulting Physician (Nephrology) Antonette Batters, MD as Consulting Physician (Cardiology) Clair Crews, MD as Referring Physician (Ophthalmology) Prescilla Brod, Ninette Basque, MD as Consulting Physician (Vascular Surgery)  Indicate any recent Medical Services you may have received from other than [facility] providers in the past year (date may be approximate).     Assessment:   This is a routine wellness examination for Nili.  Hearing/Vision screen Hearing Screening - Comments:: Has hearing aids, but doesn't wear Vision Screening - Comments:: Gets routine eye exams, Dr. Lafrances Pigeon [place]   Goals Addressed             This Visit's Progress    Patient Stated       Walk normal       Depression Screen     09/19/2023   10:22 AM 05/25/2023   11:06 AM 02/21/2023   11:34 AM 11/10/2022    9:30 AM 11/03/2022   10:53 AM 09/13/2022   11:11 AM 08/26/2022   11:22 AM  PHQ 2/9 Scores  PHQ - 2 Score 0 0 0 4 0 0 0  PHQ- 9 Score 0 3 1 5  0 0 1    Fall Risk     09/19/2023   10:26 AM 05/25/2023   11:06 AM 03/10/2023   10:07 AM  02/21/2023   11:33 AM 11/10/2022    9:30 AM  Fall Risk   Falls in the past year? 1 1 1 1 1   Number falls in past yr: 0 0 1 1 0  Injury with Fall? 0 0 0 0 0  Risk  for fall due to : History of fall(s);Impaired balance/gait;Orthopedic patient;Impaired mobility No Fall Risks History of fall(s) History of fall(s);Impaired balance/gait No Fall Risks  Follow up Falls prevention discussed;Falls evaluation completed;Education provided Falls evaluation completed Falls evaluation completed;Education provided;Falls prevention discussed Falls evaluation completed Falls evaluation completed    MEDICARE RISK AT HOME:  Medicare Risk at Home Any stairs in or around the home?: No If so, are there any without handrails?: No Home free of loose throw rugs in walkways, pet beds, electrical cords, etc?: Yes Adequate lighting in your home to reduce risk of falls?: Yes Life alert?: No Use of a cane, walker or w/c?: Yes (rollator) Grab bars in the bathroom?: Yes Shower chair or bench in shower?: Yes Elevated toilet seat or a handicapped toilet?: Yes  TIMED UP AND GO:  Was the test performed?  No  Cognitive Function: 6CIT completed    02/03/2023    1:49 PM  MMSE - Mini Mental State Exam  Orientation to time 5  Orientation to Place 5  Registration 3  Attention/ Calculation 5  Recall 3  Language- name 2 objects 2  Language- repeat 1  Language- follow 3 step command 3  Language- read & follow direction 1  Write a sentence 1  Copy design 1  Total score 30        09/19/2023   10:28 AM 09/13/2022   11:20 AM 07/15/2022   11:31 AM  6CIT Screen  What Year? 0 points 0 points 0 points  What month? 0 points 0 points 0 points  What time? 0 points 0 points 0 points  Count back from 20 4 points 4 points 0 points  Months in reverse 4 points 4 points 4 points  Repeat phrase 6 points 6 points 8 points  Total Score 14 points 14 points 12 points    Immunizations Immunization History  Administered Date(s) Administered   Fluad Trivalent(High Dose 65+) 02/03/2023   Pfizer(Comirnaty)Fall Seasonal Vaccine 12 years and older 02/17/2023   Td 10/26/2022    Screening  Tests Health Maintenance  Topic Date Due   COVID-19 Vaccine (2 - Pfizer risk series) 03/10/2023   Zoster Vaccines- Shingrix (1 of 2) 11/22/2023 (Originally 07/16/1954)   Pneumonia Vaccine 68+ Years old (1 of 2 - PCV) 01/02/2024 (Originally 07/16/1954)   INFLUENZA VACCINE  12/15/2023   MAMMOGRAM  08/03/2024   Medicare Annual Wellness (AWV)  09/18/2024   DEXA SCAN  08/03/2027   DTaP/Tdap/Td (2 - Tdap) 10/25/2032   HPV VACCINES  Aged Out   Meningococcal B Vaccine  Aged Out    Health Maintenance  Health Maintenance Due  Topic Date Due   COVID-19 Vaccine (2 - Pfizer risk series) 03/10/2023   Health Maintenance Items Addressed: See Nurse Notes  Additional Screening:  Vision Screening: Recommended annual ophthalmology exams for early detection of glaucoma and other disorders of the eye.  Dental Screening: Recommended annual dental exams for proper oral hygiene  Community Resource Referral / Chronic Care Management: CRR required this visit?  No   CCM required this visit?  No     Plan:     I have personally reviewed and noted the following in the patient's chart:   Medical  and social history Use of alcohol, tobacco or illicit drugs  Current medications and supplements including opioid prescriptions. Patient is not currently taking opioid prescriptions. Functional ability and status Nutritional status Physical activity Advanced directives List of other physicians Hospitalizations, surgeries, and ER visits in previous 12 months Vitals Screenings to include cognitive, depression, and falls Referrals and appointments  In addition, I have reviewed and discussed with patient certain preventive protocols, quality metrics, and best practice recommendations. A written personalized care plan for preventive services as well as general preventive health recommendations were provided to patient.     Jaunita Messier, CMA   09/19/2023   After Visit Summary: (Declined) Due to this being a  telephonic visit, with patients personalized plan was offered to patient but patient Declined AVS at this time   Notes: Please refer to Routing Comments.

## 2023-09-20 DIAGNOSIS — R2689 Other abnormalities of gait and mobility: Secondary | ICD-10-CM | POA: Diagnosis not present

## 2023-09-20 DIAGNOSIS — Z9181 History of falling: Secondary | ICD-10-CM | POA: Diagnosis not present

## 2023-09-20 DIAGNOSIS — I10 Essential (primary) hypertension: Secondary | ICD-10-CM | POA: Diagnosis not present

## 2023-09-20 DIAGNOSIS — M6281 Muscle weakness (generalized): Secondary | ICD-10-CM | POA: Diagnosis not present

## 2023-09-20 DIAGNOSIS — R262 Difficulty in walking, not elsewhere classified: Secondary | ICD-10-CM | POA: Diagnosis not present

## 2023-09-25 ENCOUNTER — Ambulatory Visit (INDEPENDENT_AMBULATORY_CARE_PROVIDER_SITE_OTHER): Payer: 59 | Admitting: Vascular Surgery

## 2023-09-26 DIAGNOSIS — R600 Localized edema: Secondary | ICD-10-CM | POA: Diagnosis not present

## 2023-09-26 DIAGNOSIS — I129 Hypertensive chronic kidney disease with stage 1 through stage 4 chronic kidney disease, or unspecified chronic kidney disease: Secondary | ICD-10-CM | POA: Diagnosis not present

## 2023-09-26 DIAGNOSIS — N1832 Chronic kidney disease, stage 3b: Secondary | ICD-10-CM | POA: Diagnosis not present

## 2023-09-29 ENCOUNTER — Telehealth (INDEPENDENT_AMBULATORY_CARE_PROVIDER_SITE_OTHER): Payer: Self-pay

## 2023-09-29 NOTE — Telephone Encounter (Signed)
 Let's see if we can reach out to Tracie Fisher to see if he can help shed some light on this

## 2023-09-29 NOTE — Telephone Encounter (Signed)
 Tracie Fisher from [employer] care called on behalf of Tracie Fisher stating Tracie Fisher called mentioning her compressions socks were not the right fit. They are not staying up.   After speaking with her she was speaking of the compression pumps from [employer]. She stated that she called the woman from [employer] that she worked with to get them and and she stated that she should have told her when was fitting her and that now she has to purchase a new pair.   Please advise

## 2023-09-29 NOTE — Telephone Encounter (Signed)
 I contacted Everlena Hoard at [facility] and he will contact the patient and find out what is going on. With the compression sleeves you should be lying down and not up for it to roll down.

## 2023-10-02 DIAGNOSIS — E875 Hyperkalemia: Secondary | ICD-10-CM | POA: Diagnosis not present

## 2023-10-02 DIAGNOSIS — I129 Hypertensive chronic kidney disease with stage 1 through stage 4 chronic kidney disease, or unspecified chronic kidney disease: Secondary | ICD-10-CM | POA: Diagnosis not present

## 2023-10-02 DIAGNOSIS — R600 Localized edema: Secondary | ICD-10-CM | POA: Diagnosis not present

## 2023-10-02 DIAGNOSIS — N1832 Chronic kidney disease, stage 3b: Secondary | ICD-10-CM | POA: Diagnosis not present

## 2023-10-05 DIAGNOSIS — G43719 Chronic migraine without aura, intractable, without status migrainosus: Secondary | ICD-10-CM | POA: Diagnosis not present

## 2023-10-08 ENCOUNTER — Emergency Department
Admission: EM | Admit: 2023-10-08 | Discharge: 2023-10-08 | Disposition: A | Discharge status: Home / Self Care | Attending: Emergency Medicine | Admitting: Emergency Medicine

## 2023-10-08 ENCOUNTER — Other Ambulatory Visit: Payer: Self-pay

## 2023-10-08 ENCOUNTER — Emergency Department

## 2023-10-08 DIAGNOSIS — Z8673 Personal history of transient ischemic attack (TIA), and cerebral infarction without residual deficits: Secondary | ICD-10-CM | POA: Insufficient documentation

## 2023-10-08 DIAGNOSIS — R519 Headache, unspecified: Secondary | ICD-10-CM | POA: Insufficient documentation

## 2023-10-08 MED ORDER — BUTALBITAL-APAP-CAFFEINE 50-325-40 MG PO TABS: 1.0000 | ORAL_TABLET | Freq: Four times a day (QID) | ORAL | 0 refills | Status: DC | PRN

## 2023-10-08 MED ORDER — BUTALBITAL-APAP-CAFFEINE 50-325-40 MG PO TABS
2.0000 | ORAL_TABLET | Freq: Once | ORAL | Status: AC
  Administered 2023-10-08: 2 via ORAL
  Filled 2023-10-08: qty 2

## 2023-10-08 MED ORDER — LIDOCAINE 5 % EX PTCH
1.0000 | MEDICATED_PATCH | CUTANEOUS | Status: DC
  Administered 2023-10-08: 1 via TRANSDERMAL
  Filled 2023-10-08: qty 1

## 2023-10-08 NOTE — Discharge Instructions (Signed)
 As we discussed, try using the Fioricet to help relieve/abort your severe headaches.  As we discussed, keep track of the Tylenol  that you are taking as this medication includes Tylenol  within it.  Follow-up with your neurologist  Return to the ED with any worsening symptoms despite these measures

## 2023-10-08 NOTE — ED Triage Notes (Signed)
 Pt here with a headache. Pt states she has a hx of headaches but woke up this morning in severe pain and was crying. Pt states she normally get shots in her head and has not had a scan of her in the past 2 years. Pt denies NVD. Pt denies photosensitivity.

## 2023-10-08 NOTE — ED Notes (Signed)
 ED Provider at bedside.

## 2023-10-08 NOTE — ED Provider Notes (Signed)
 [facility] Provider Note    Event Date/Time   First MD Initiated Contact with Patient 10/08/23 1306     (approximate)   History   Headache   HPI  Tracie Fisher is a 88 y.o. female who presents to the ED for evaluation of Headache   Review a neurology clinic visit from March.  History of stroke, memory loss and occipital neuralgia.  Daily occipital headaches.  Occipital nerve blocks in the past and recently started Botox injection, performed 3 days ago.  Patient presents to the ED due to acute on chronic occipital headache.  Awoke this morning with severe occipital headache.  Took Tylenol  earlier this morning but headache persisted so she presents to the ED.  She is asking about a scan of her head, with frustration that she does not know why she has had severe headaches for the past 2 years nearly daily.   Physical Exam   Triage Vital Signs: ED Triage Vitals [10/08/23 1309]  Encounter Vitals Group     BP      Systolic BP Percentile      Diastolic BP Percentile      Pulse      Resp      Temp      Temp src      SpO2      Weight 147 lb 14.9 oz (67.1 kg)     Height 5' (1.524 m)     Head Circumference      Peak Flow      Pain Score 10     Pain Loc      Pain Education      Exclude from Growth Chart     Most recent vital signs: Vitals:   10/08/23 1310 10/08/23 1330  BP: (!) 168/62 (!) 154/61  Pulse: (!) 57 (!) 56  Resp: 17 19  Temp: 97.6 F (36.4 C)   SpO2: 100% 98%    General: Awake, no distress.  CV:  Good peripheral perfusion.  Resp:  Normal effort.  Abd:  No distention.  MSK:  No deformity noted.  Neuro:  No focal deficits appreciated. Cranial nerves II through XII intact 5/5 strength and sensation in all 4 extremities Other:     ED Results / Procedures / Treatments   Labs (all labs ordered are listed, but only abnormal results are displayed) Labs Reviewed - No data to display  EKG   RADIOLOGY CT head interpreted by  me without evidence of acute intracranial pathology   Official radiology report(s): CT HEAD WO CONTRAST ( ) Result Date: 10/08/2023 CLINICAL DATA:  Severe occipital headache. History of headaches and undergoes local injections. EXAM: CT HEAD WITHOUT CONTRAST TECHNIQUE: Contiguous axial images were obtained from the base of the skull through the vertex without intravenous contrast. RADIATION DOSE REDUCTION: This exam was performed according to the departmental dose-optimization program which includes automated exposure control, adjustment of the mA and/or kV according to patient size and/or use of iterative reconstruction technique. COMPARISON:  10/13/2019 FINDINGS: Brain: Ventricles, cisterns and other CSF spaces are normal. There is moderate chronic ischemic microvascular disease. Old left frontal infarct. No mass, mass effect, shift of midline structures or acute hemorrhage. No evidence of acute infarction. Vascular: No hyperdense vessel or unexpected calcification. Skull: Normal. Negative for fracture or focal lesion. Sinuses/Orbits: Orbits are normal.  Hypoplastic frontal sinuses. Other: None. IMPRESSION: 1. No acute findings. 2. 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 Electronically Signed  By: Roda Cirri M.D.   On: 10/08/2023 14:14    PROCEDURES and INTERVENTIONS:  Procedures  Medications  lidocaine  (LIDODERM ) 5 % 1 patch (1 patch Transdermal Patch Applied 10/08/23 1337)  butalbital-acetaminophen -caffeine (FIORICET) 50-325-40 MG per tablet 2 tablet (2 tablets Oral Given 10/08/23 1335)     IMPRESSION / MDM / ASSESSMENT AND PLAN / ED COURSE  I reviewed the triage vital signs and the nursing notes.  Differential diagnosis includes, but is not limited to, migraine headache, tension headache, ICH  {Patient presents with symptoms of an acute illness or injury that is potentially life-threatening.  Patient presents with acute on chronic headaches  without evidence of neurologic deficits or any recent trauma.  We will obtain a CT scan, provide nonnarcotic analgesia.  Improving pain, CT without acute features.  No neurologic deficits or indication for MRI at this point.  Suitable for outpatient management with neurologic follow-up.  Clinical Course as of 10/08/23 1511  Sun Oct 08, 2023  1448 Reassessed, feeling better.  Discussed plan of care.  Discussed reassuring CT [DS]    Clinical Course User Index [DS] Arline Bennett, MD     FINAL CLINICAL IMPRESSION(S) / ED DIAGNOSES   Final diagnoses:  Bad headache  Recurrent occipital headache     Rx / DC Orders   ED Discharge Orders          Ordered    butalbital-acetaminophen -caffeine (FIORICET) 50-325-40 MG tablet  Every 6 hours PRN        10/08/23 1501             Note:  This document was prepared using Dragon voice recognition software and may include unintentional dictation errors.   Arline Bennett, MD 10/08/23 [phone]

## 2023-10-14 NOTE — Progress Notes (Signed)
 MRN : [id number]  Tracie Fisher is a 88 y.o. ([birth year]) female who presents with chief complaint of legs swell.  History of Present Illness:   Recommend:  The patient returns to the office for followup evaluation regarding leg swelling.  The swelling has persisted but with the lymph pump the patient states the swelling is much better controlled. The pain associated with swelling is essentially eliminated. There have not been any interval development of a ulcerations or wounds.  No episodes of cellulitis or infection over the past 12 months  The patient denies problems with the pump, noting it is working well and the leggings are in good condition.  Since the previous visit the patient has been wearing graduated compression stockings and using the lymph pump on a routine basis and  has noted significant improvement in the lymphedema.   Patient stated the lymph pump has been helpful in treating the lymphedema.   The patient is also followed for carotid stenosis. The carotid stenosis followed by ultrasound.   The patient denies amaurosis fugax. There is no recent history of TIA symptoms or focal motor deficits. There is no prior documented CVA.  The patient is taking enteric-coated aspirin 81 mg daily.  No recent shortening of the patient's walking distance or new symptoms consistent with claudication.  No history of rest pain symptoms. No new ulcers or wounds of the lower extremities have occurred.  No documented recent episodes of angina or shortness of breath documented.   Previous Carotid Duplex dated 08/05/2022  showed <50% bilateral ICA stenosis.     No outpatient medications have been marked as taking for the 10/16/23 encounter (Appointment) with Prescilla Brod, Ninette Basque, MD.    Past Medical History:  Diagnosis Date   Cancer [facility])    SKIN   Complication of anesthesia    Depression    Dysrhythmia    GERD (gastroesophageal reflux disease)    Hiatal  hernia    HOH (hard of hearing)    Hypertension    Hypothyroidism    PONV (postoperative nausea and vomiting)    Thyroid  disease     Past Surgical History:  Procedure Laterality Date   APPENDECTOMY     BACK SURGERY     MULTIPLE/ROD IN BACK   BREAST CYST ASPIRATION Left "years ago"   CATARACT EXTRACTION W/PHACO Left 03/21/2017   Procedure: CATARACT EXTRACTION PHACO AND INTRAOCULAR LENS PLACEMENT (IOC);  Surgeon: Clair Crews, MD;  Location: [facility] ORS;  Service: Ophthalmology;  Laterality: Left;  US  00:48 AP% 17.8 CDE 8.57 Fluid pack lot # [id number]   CATARACT EXTRACTION W/PHACO Right 04/11/2017   Procedure: CATARACT EXTRACTION PHACO AND INTRAOCULAR LENS PLACEMENT (IOC);  Surgeon: Clair Crews, MD;  Location: [facility] ORS;  Service: Ophthalmology;  Laterality: Right;  US  01:09.5 AP% 15.8 CDE 11.04 Fluid Pack lot # [id number]   CHOLECYSTECTOMY     COLON SURGERY     BOWEL OBSTRUCTION   COLONOSCOPY WITH PROPOFOL  N/A 04/25/2016   Procedure: COLONOSCOPY WITH PROPOFOL ;  Surgeon: Luke Salaam, MD;  Location: [facility] ENDOSCOPY;  Service: Endoscopy;  Laterality: N/A;   ESOPHAGOGASTRODUODENOSCOPY (EGD) WITH PROPOFOL  N/A 04/25/2016   Procedure: ESOPHAGOGASTRODUODENOSCOPY (EGD) WITH PROPOFOL ;  Surgeon: Luke Salaam, MD;  Location: [facility] ENDOSCOPY;  Service: Endoscopy;  Laterality: N/A;   EXPLORATORY LAPAROTOMY  10/18/2011   blockage small intestines  GIVENS CAPSULE STUDY N/A 05/11/2016   Procedure: GIVENS CAPSULE STUDY;  Surgeon: Luke Salaam, MD;  Location: [facility] ENDOSCOPY;  Service: Endoscopy;  Laterality: N/A;    Social History Social History   Tobacco Use   Smoking status: Former    Current packs/day: 0.00    Average packs/day: 2.0 packs/day for 35.0 years (70.0 ttl pk-yrs)    Types: Cigarettes    Start date: 71    Quit date: 1990    Years since quitting: 35.4   Smokeless tobacco: Never  Vaping Use   Vaping status: Never Used  Substance Use Topics   Alcohol use: Not Currently     Comment: 1 beer /day or 2 occ, last use ~2023   Drug use: No    Family History Family History  Problem Relation Age of Onset   Other Mother        unknown medical history   Heart disease Father    Breast cancer Sister 19   Heart attack Brother     Allergies  Allergen Reactions   Aspirin Itching   Penicillins Other (See Comments)    Unknown reaction Has patient had a PCN reaction causing immediate rash, facial/tongue/throat swelling, SOB or lightheadedness with hypotension: Unknown Has patient had a PCN reaction causing severe rash involving mucus membranes or skin necrosis: Unknown Has patient had a PCN reaction that required hospitalization: Unknown Has patient had a PCN reaction occurring within the last 10 years: No If all of the above answers are "NO", then may proceed with Cephalosporin use. Patient does not remember the reaction to penicillin.   Penicillin G     Unknown reaction Has patient had a PCN reaction causing immediate rash, facial/tongue/throat swelling, SOB or lightheadedness with hypotension: Unknown Has patient had a PCN reaction causing severe rash involving mucus membranes or skin necrosis: Unknown Has patient had a PCN reaction that required hospitalization: Unknown Has patient had a PCN reaction occurring within the last 10 years: No If all of the above answers are "NO", then may proceed with Cephalosporin use.      REVIEW OF SYSTEMS (Negative unless checked)  Constitutional: [] Weight loss  [] Fever  [] Chills Cardiac: [] Chest pain   [] Chest pressure   [] Palpitations   [] Shortness of breath when laying flat   [] Shortness of breath with exertion. Vascular:  [] Pain in legs with walking   [x] Pain in legs with standing  [] History of DVT   [] Phlebitis   [x] Swelling in legs   [x] Varicose veins   [] Non-healing ulcers Pulmonary:   [] Uses home oxygen   [] Productive cough   [] Hemoptysis   [] Wheeze  [] COPD   [] Asthma Neurologic:  [] Dizziness   [] Seizures   [] History  of stroke   [] History of TIA  [] Aphasia   [] Vissual changes   [] Weakness or numbness in arm   [] Weakness or numbness in leg Musculoskeletal:   [] Joint swelling   [x] Joint pain   [] Low back pain Hematologic:  [] Easy bruising  [] Easy bleeding   [] Hypercoagulable state   [] Anemic Gastrointestinal:  [] Diarrhea   [] Vomiting  [x] Gastroesophageal reflux/heartburn   [x] Difficulty swallowing. Genitourinary:  [] Chronic kidney disease   [] Difficult urination  [] Frequent urination   [] Blood in urine Skin:  [] Rashes   [] Ulcers  Psychological:  [] History of anxiety   []  History of major depression.  Physical Examination  There were no vitals filed for this visit. There is no height or weight on file to calculate BMI. Gen: WD/WN, NAD Head: [place]/AT, No temporalis wasting.  Ear/Nose/Throat: Hearing  grossly intact, nares w/o erythema or drainage, pinna without lesions Eyes: PER, EOMI, sclera nonicteric.  Neck: Supple, no gross masses.  No JVD.  Pulmonary:  Good air movement, no audible wheezing, no use of accessory muscles.  Cardiac: RRR, precordium not hyperdynamic. Vascular:  scattered varicosities present bilaterally.  Mild venous stasis changes to the legs bilaterally.  3-4+ soft pitting edema, CEAP C4sEpAsPr.  No carotid bruits noted Vessel Right Left  Radial Palpable Palpable  Gastrointestinal: soft, non-distended. No guarding/no peritoneal signs.  Musculoskeletal: M/S 5/5 throughout.  No deformity.  Neurologic: CN 2-12 intact. Pain and light touch intact in extremities.  Symmetrical.  Speech is fluent. Motor exam as listed above. Psychiatric: Judgment intact, Mood & affect appropriate for pt's clinical situation. Dermatologic: Venous rashes no ulcers noted.  No changes consistent with cellulitis. Lymph : No lichenification or skin changes of chronic lymphedema.  CBC Lab Results  Component Value Date   WBC 6.5 06/14/2023   HGB 12.4 06/14/2023   HCT 38.7 06/14/2023   MCV 97.7 06/14/2023   PLT  286 06/14/2023    BMET    Component Value Date/Time   NA 142 02/03/2023 1405   NA 136 02/13/2013 2024   K 3.8 02/03/2023 1405   K 4.0 02/13/2013 2024   CL 102 02/03/2023 1405   CL 103 02/13/2013 2024   CO2 24 02/03/2023 1405   CO2 22 02/13/2013 2024   GLUCOSE 86 02/03/2023 1405   GLUCOSE 91 12/04/2022 0351   GLUCOSE 93 02/13/2013 2024   BUN 23 02/03/2023 1405   BUN 14 02/13/2013 2024   CREATININE 1.20 (H) 02/03/2023 1405   CREATININE 1.09 02/13/2013 2024   CALCIUM 9.1 02/03/2023 1405   CALCIUM 9.8 02/13/2013 2024   GFRNONAA 49 (L) 12/04/2022 0351   GFRNONAA 50 (L) 02/13/2013 2024   GFRAA 52 (L) 10/13/2019 1945   GFRAA 58 (L) 02/13/2013 2024   CrCl cannot be calculated (Patient's most recent lab result is older than the maximum 21 days allowed.).  COAG Lab Results  Component Value Date   INR 1.0 08/04/2022   INR 0.9 10/15/2011    Radiology CT HEAD WO CONTRAST ( ) Result Date: 10/08/2023 CLINICAL DATA:  Severe occipital headache. History of headaches and undergoes local injections. EXAM: CT HEAD WITHOUT CONTRAST TECHNIQUE: Contiguous axial images were obtained from the base of the skull through the vertex without intravenous contrast. RADIATION DOSE REDUCTION: This exam was performed according to the departmental dose-optimization program which includes automated exposure control, adjustment of the mA and/or kV according to patient size and/or use of iterative reconstruction technique. COMPARISON:  10/13/2019 FINDINGS: Brain: Ventricles, cisterns and other CSF spaces are normal. There is moderate chronic ischemic microvascular disease. Old left frontal infarct. No mass, mass effect, shift of midline structures or acute hemorrhage. No evidence of acute infarction. Vascular: No hyperdense vessel or unexpected calcification. Skull: Normal. Negative for fracture or focal lesion. Sinuses/Orbits: Orbits are normal.  Hypoplastic frontal sinuses. Other: None. IMPRESSION: 1. No acute  findings. 2. 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 Electronically Signed   By: Roda Cirri M.D.   On: 10/08/2023 14:14     Assessment/Plan 1. Lymphedema (Primary) Recommend:  No surgery or intervention at this point in time.    I have reviewed my discussion with the patient regarding lymphedema and why it  causes symptoms.  Patient will continue wearing graduated compression on a daily basis. The patient should put the compression on first thing in the  morning and removing them in the evening. The patient should not sleep in the compression.   In addition, behavioral modification throughout the day will be continued.  This will include frequent elevation (such as in a recliner), use of over the counter pain medications as needed and exercise such as walking.  The systemic causes for chronic edema such as liver, kidney and cardiac etiologies does not appear to have significant changed over the past year.    The patient will continue aggressive use of the  lymph pump.  This will continue to improve the edema control and prevent sequela such as ulcers and infections.   The patient will follow-up with me on an annual basis.   2. Varicose veins of both lower extremities without ulcer or inflammation Recommend:  The patient is complaining of varicose veins.    I have had a long discussion with the patient regarding  varicose veins and why they cause symptoms.  Patient will begin wearing graduated compression stockings on a daily basis, beginning first thing in the morning and removing them in the evening. The patient is instructed specifically not to sleep in the stockings.    The patient  will also begin using over-the-counter analgesics such as Motrin 600 mg po TID to help control the symptoms as needed.    In addition, behavioral modification including elevation during the day will be initiated, utilizing a recliner was recommended.  The patient is  also instructed to continue exercising such as walking 4-5 times per week.  At this time the patient wishes to continue conservative therapy and is not interested in more invasive treatments such as laser ablation and sclerotherapy.  3. Essential hypertension Continue antihypertensive medications as already ordered, these medications have been reviewed and there are no changes at this time.  4. Primary osteoarthritis involving multiple joints Continue medications to treat the patient's degenerative disease as already ordered, these medications have been reviewed and there are no changes at this time.  Continued activity and therapy was stressed.  5. Mixed hyperlipidemia Continue statin as ordered and reviewed, no changes at this time  6. Bilateral carotid artery stenosis Recommend:  Given the patient's asymptomatic subcritical stenosis no further invasive testing or surgery at this time.  Duplex ultrasound shows <50% stenosis bilaterally.  Continue antiplatelet therapy as prescribed Continue management of CAD, HTN and Hyperlipidemia Healthy heart diet,  encouraged exercise at least 4 times per week  Follow up in 24 months with duplex ultrasound and physical exam     Devon Fogo, MD  10/14/2023 3:33 PM

## 2023-10-16 ENCOUNTER — Ambulatory Visit (INDEPENDENT_AMBULATORY_CARE_PROVIDER_SITE_OTHER): Admitting: Vascular Surgery

## 2023-10-16 ENCOUNTER — Encounter (INDEPENDENT_AMBULATORY_CARE_PROVIDER_SITE_OTHER): Payer: Self-pay | Admitting: Vascular Surgery

## 2023-10-16 VITALS — BP 162/68 | HR 87 | Resp 16 | Wt 148.6 lb

## 2023-10-16 DIAGNOSIS — M150 Primary generalized (osteo)arthritis: Secondary | ICD-10-CM

## 2023-10-16 DIAGNOSIS — I6523 Occlusion and stenosis of bilateral carotid arteries: Secondary | ICD-10-CM | POA: Diagnosis not present

## 2023-10-16 DIAGNOSIS — I10 Essential (primary) hypertension: Secondary | ICD-10-CM

## 2023-10-16 DIAGNOSIS — I890 Lymphedema, not elsewhere classified: Secondary | ICD-10-CM | POA: Diagnosis not present

## 2023-10-16 DIAGNOSIS — E782 Mixed hyperlipidemia: Secondary | ICD-10-CM | POA: Diagnosis not present

## 2023-10-16 DIAGNOSIS — I8393 Asymptomatic varicose veins of bilateral lower extremities: Secondary | ICD-10-CM

## 2023-10-18 ENCOUNTER — Inpatient Hospital Stay: Payer: 59 | Attending: Oncology

## 2023-10-18 DIAGNOSIS — Z862 Personal history of diseases of the blood and blood-forming organs and certain disorders involving the immune mechanism: Secondary | ICD-10-CM | POA: Diagnosis not present

## 2023-10-18 DIAGNOSIS — I129 Hypertensive chronic kidney disease with stage 1 through stage 4 chronic kidney disease, or unspecified chronic kidney disease: Secondary | ICD-10-CM | POA: Insufficient documentation

## 2023-10-18 DIAGNOSIS — N189 Chronic kidney disease, unspecified: Secondary | ICD-10-CM | POA: Diagnosis not present

## 2023-10-18 DIAGNOSIS — Z79899 Other long term (current) drug therapy: Secondary | ICD-10-CM | POA: Diagnosis not present

## 2023-10-18 DIAGNOSIS — Z87891 Personal history of nicotine dependence: Secondary | ICD-10-CM | POA: Insufficient documentation

## 2023-10-18 DIAGNOSIS — D509 Iron deficiency anemia, unspecified: Secondary | ICD-10-CM

## 2023-10-18 LAB — CBC WITH DIFFERENTIAL/PLATELET
Abs Immature Granulocytes: 0.02 10*3/uL (ref 0.00–0.07)
Basophils Absolute: 0 10*3/uL (ref 0.0–0.1)
Basophils Relative: 1 %
Eosinophils Absolute: 0.4 10*3/uL (ref 0.0–0.5)
Eosinophils Relative: 8 %
HCT: 37.5 % (ref 36.0–46.0)
Hemoglobin: 12.2 g/dL (ref 12.0–15.0)
Immature Granulocytes: 0 %
Lymphocytes Relative: 32 %
Lymphs Abs: 1.9 10*3/uL (ref 0.7–4.0)
MCH: 31.3 pg (ref 26.0–34.0)
MCHC: 32.5 g/dL (ref 30.0–36.0)
MCV: 96.2 fL (ref 80.0–100.0)
Monocytes Absolute: 0.7 10*3/uL (ref 0.1–1.0)
Monocytes Relative: 13 %
Neutro Abs: 2.8 10*3/uL (ref 1.7–7.7)
Neutrophils Relative %: 46 %
Platelets: 267 10*3/uL (ref 150–400)
RBC: 3.9 MIL/uL (ref 3.87–5.11)
RDW: 12.7 % (ref 11.5–15.5)
WBC: 5.9 10*3/uL (ref 4.0–10.5)
nRBC: 0 % (ref 0.0–0.2)

## 2023-10-18 LAB — IRON AND TIBC
Iron: 44 ug/dL (ref 28–170)
Saturation Ratios: 12 % (ref 10.4–31.8)
TIBC: 358 ug/dL (ref 250–450)
UIBC: 314 ug/dL

## 2023-10-18 LAB — FERRITIN: Ferritin: 30 ng/mL (ref 11–307)

## 2023-10-19 ENCOUNTER — Encounter: Payer: Self-pay | Admitting: Oncology

## 2023-10-19 ENCOUNTER — Inpatient Hospital Stay (HOSPITAL_BASED_OUTPATIENT_CLINIC_OR_DEPARTMENT_OTHER): Admitting: Oncology

## 2023-10-19 ENCOUNTER — Ambulatory Visit: Payer: 59 | Admitting: Oncology

## 2023-10-19 ENCOUNTER — Ambulatory Visit: Payer: 59

## 2023-10-19 ENCOUNTER — Ambulatory Visit

## 2023-10-19 ENCOUNTER — Inpatient Hospital Stay

## 2023-10-19 ENCOUNTER — Ambulatory Visit: Admitting: Oncology

## 2023-10-19 VITALS — BP 158/69 | HR 64 | Temp 97.0°F | Resp 16 | Ht 60.0 in | Wt 147.0 lb

## 2023-10-19 DIAGNOSIS — Z87891 Personal history of nicotine dependence: Secondary | ICD-10-CM | POA: Diagnosis not present

## 2023-10-19 DIAGNOSIS — I129 Hypertensive chronic kidney disease with stage 1 through stage 4 chronic kidney disease, or unspecified chronic kidney disease: Secondary | ICD-10-CM | POA: Diagnosis not present

## 2023-10-19 DIAGNOSIS — D509 Iron deficiency anemia, unspecified: Secondary | ICD-10-CM

## 2023-10-19 DIAGNOSIS — Z862 Personal history of diseases of the blood and blood-forming organs and certain disorders involving the immune mechanism: Secondary | ICD-10-CM | POA: Diagnosis not present

## 2023-10-19 DIAGNOSIS — Z79899 Other long term (current) drug therapy: Secondary | ICD-10-CM | POA: Diagnosis not present

## 2023-10-19 DIAGNOSIS — N189 Chronic kidney disease, unspecified: Secondary | ICD-10-CM | POA: Diagnosis not present

## 2023-10-19 NOTE — Progress Notes (Signed)
 [facility]  [phone] [phone]  ID: Hazen Liter OB: 02-02-1936  MR#: [id number]  AOZ#:[id number]  Patient Care Team: Lemar Pyles, NP as PCP - General (Nurse Practitioner) Shellie Dials, MD as Consulting Physician (Oncology) Rica Chalet, MD as Consulting Physician (Nephrology) Antonette Batters, MD as Consulting Physician (Cardiology) Clair Crews, MD as Referring Physician (Ophthalmology) Prescilla Brod, Ninette Basque, MD as Consulting Physician (Vascular Surgery) Bufford Carne, MD as Referring Physician (Neurology) Rosan Comfort, MD as Consulting Physician (Neurology)  CHIEF COMPLAINT: Iron  deficiency anemia.  INTERVAL HISTORY: Patient returns to clinic today for repeat laboratory, further evaluation, and consideration of additional IV Venofer .  She continues to feel well and remains asymptomatic.  She does not complain of any weakness or fatigue. She has no neurologic complaints.  She denies any recent fevers or illnesses.  She has a good appetite and denies weight loss.  She has no chest pain, shortness of breath, cough, or hemoptysis.  She denies any nausea, vomiting, constipation, or diarrhea.  She has no melena or hematochezia.  She has no urinary complaints.  Patient offers no specific complaints today.  REVIEW OF SYSTEMS:   Review of Systems  Constitutional: Negative.  Negative for fever, malaise/fatigue and weight loss.  Respiratory:  Negative for cough, hemoptysis and shortness of breath.   Cardiovascular: Negative.  Negative for chest pain and leg swelling.  Gastrointestinal: Negative.  Negative for abdominal pain, blood in stool and melena.  Genitourinary: Negative.  Negative for hematuria.  Musculoskeletal: Negative.  Negative for back pain.  Skin: Negative.  Negative for rash.  Neurological: Negative.  Negative for dizziness, focal weakness, weakness and headaches.  Psychiatric/Behavioral: Negative.  The patient  is not nervous/anxious.     As per HPI. Otherwise, a complete review of systems is negative.  PAST MEDICAL HISTORY: Past Medical History:  Diagnosis Date   Cancer (HCC)    SKIN   Complication of anesthesia    Depression    Dysrhythmia    GERD (gastroesophageal reflux disease)    Hiatal hernia    HOH (hard of hearing)    Hypertension    Hypothyroidism    PONV (postoperative nausea and vomiting)    Thyroid  disease     PAST SURGICAL HISTORY: Past Surgical History:  Procedure Laterality Date   APPENDECTOMY     BACK SURGERY     MULTIPLE/ROD IN BACK   BREAST CYST ASPIRATION Left "years ago"   CATARACT EXTRACTION W/PHACO Left 03/21/2017   Procedure: CATARACT EXTRACTION PHACO AND INTRAOCULAR LENS PLACEMENT (IOC);  Surgeon: Clair Crews, MD;  Location: [facility];  Service: Ophthalmology;  Laterality: Left;  US  00:48 AP% 17.8 CDE 8.57 Fluid pack lot # [id number]   CATARACT EXTRACTION W/PHACO Right 04/11/2017   Procedure: CATARACT EXTRACTION PHACO AND INTRAOCULAR LENS PLACEMENT (IOC);  Surgeon: Clair Crews, MD;  Location: [facility];  Service: Ophthalmology;  Laterality: Right;  US  01:09.5 AP% 15.8 CDE 11.04 Fluid Pack lot # [id number]   CHOLECYSTECTOMY     COLON SURGERY     BOWEL OBSTRUCTION   COLONOSCOPY WITH PROPOFOL  N/A 04/25/2016   Procedure: COLONOSCOPY WITH PROPOFOL ;  Surgeon: Luke Salaam, MD;  Location: [facility];  Service: Endoscopy;  Laterality: N/A;   ESOPHAGOGASTRODUODENOSCOPY (EGD) WITH PROPOFOL  N/A 04/25/2016   Procedure: ESOPHAGOGASTRODUODENOSCOPY (EGD) WITH PROPOFOL ;  Surgeon: Luke Salaam, MD;  Location: [facility];  Service: Endoscopy;  Laterality: N/A;   EXPLORATORY LAPAROTOMY  10/18/2011   blockage small intestines   GIVENS CAPSULE  STUDY N/A 05/11/2016   Procedure: GIVENS CAPSULE STUDY;  Surgeon: Luke Salaam, MD;  Location: [facility]COPY;  Service: Endoscopy;  Laterality: N/A;    FAMILY HISTORY: Family History  Problem Relation Age of Onset    Other Mother        unknown medical history   Heart disease Father    Breast cancer Sister 40   Heart attack Brother     ADVANCED DIRECTIVES (Y/N):  N  HEALTH MAINTENANCE: Social History   Tobacco Use   Smoking status: Former    Current packs/day: 0.00    Average packs/day: 2.0 packs/day for 35.0 years (70.0 ttl pk-yrs)    Types: Cigarettes    Start date: 76    Quit date: 68    Years since quitting: 35.4   Smokeless tobacco: Never  Vaping Use   Vaping status: Never Used  Substance Use Topics   Alcohol use: Not Currently    Comment: 1 beer /day or 2 occ, last use ~2023   Drug use: No     Colonoscopy:  PAP:  Bone density:  Lipid panel:  Allergies  Allergen Reactions   Aspirin Itching   Penicillins Other (See Comments)    Unknown reaction Has patient had a PCN reaction causing immediate rash, facial/tongue/throat swelling, SOB or lightheadedness with hypotension: Unknown Has patient had a PCN reaction causing severe rash involving mucus membranes or skin necrosis: Unknown Has patient had a PCN reaction that required hospitalization: Unknown Has patient had a PCN reaction occurring within the last 10 years: No If all of the above answers are "NO", then may proceed with Cephalosporin use. Patient does not remember the reaction to penicillin.   Penicillin G     Unknown reaction Has patient had a PCN reaction causing immediate rash, facial/tongue/throat swelling, SOB or lightheadedness with hypotension: Unknown Has patient had a PCN reaction causing severe rash involving mucus membranes or skin necrosis: Unknown Has patient had a PCN reaction that required hospitalization: Unknown Has patient had a PCN reaction occurring within the last 10 years: No If all of the above answers are "NO", then may proceed with Cephalosporin use.     Current Outpatient Medications  Medication Sig Dispense Refill   acetaminophen  (TYLENOL ) 325 MG tablet Take 650 mg by mouth every 6  (six) hours as needed.     ALPRAZolam  (XANAX ) 1 MG tablet Take 0.5 mg (1/2 tablet) by mouth in the morning and 1 mg (1 tablet) by mouth at bedtime. 45 tablet 2   butalbital -acetaminophen -caffeine  (FIORICET ) 50-325-40 MG tablet Take 1-2 tablets by mouth every 6 (six) hours as needed for headache. 20 tablet 0   Cholecalciferol (VITAMIN D3) 50 MCG (2000 UT) CAPS Take 2,000 Units by mouth daily.     clopidogrel  (PLAVIX ) 75 MG tablet Take 1 tablet (75 mg total) by mouth daily. 90 tablet 4   cyanocobalamin  (V-R VITAMIN B-12) 500 MCG tablet Take 500 mcg by mouth daily.      ferrous gluconate  (FERGON) 240 (27 FE) MG tablet Take 240 mg by mouth 2 (two) times daily.     levothyroxine  (SYNTHROID ) 50 MCG tablet Take 1 tablet (50 mcg total) by mouth daily before breakfast. 90 tablet 4   metolazone (ZAROXOLYN) 2.5 MG tablet Take 2.5 mg by mouth once a week.     pravastatin  (PRAVACHOL ) 20 MG tablet TAKE 1 TABLET BY MOUTH EVERY EVENING 90 tablet 4   pregabalin  (LYRICA ) 75 MG capsule Take 1 capsule (75 mg total) by mouth 3 (  three) times daily. 90 capsule 1   torsemide  (DEMADEX ) 20 MG tablet Take 20 mg by mouth daily.     No current facility-administered medications for this visit.    OBJECTIVE: Vitals:   10/19/23 1355  BP: (!) 158/69  Pulse: 64  Resp: 16  Temp: (!) 97 F (36.1 C)  SpO2: 100%      Body mass index is 28.71 kg/m.    ECOG FS:0 - Asymptomatic  General: Well-developed, well-nourished, no acute distress. Eyes: Pink conjunctiva, anicteric sclera. HEENT: Normocephalic, moist mucous membranes. Lungs: No audible wheezing or coughing. Heart: Regular rate and rhythm. Abdomen: Soft, nontender, no obvious distention. Musculoskeletal: No edema, cyanosis, or clubbing. Neuro: Alert, answering all questions appropriately. Cranial nerves grossly intact. Skin: No rashes or petechiae noted. Psych: Normal affect.  LAB RESULTS:  Lab Results  Component Value Date   NA 142 02/03/2023   K 3.8  02/03/2023   CL 102 02/03/2023   CO2 24 02/03/2023   GLUCOSE 86 02/03/2023   BUN 23 02/03/2023   CREATININE 1.20 (H) 02/03/2023   CALCIUM 9.1 02/03/2023   PROT 5.9 (L) 02/03/2023   ALBUMIN 4.0 02/03/2023   AST 23 02/03/2023   ALT 10 02/03/2023   ALKPHOS 84 02/03/2023   BILITOT 0.2 02/03/2023   GFRNONAA 49 (L) 12/04/2022   GFRAA 52 (L) 10/13/2019    Lab Results  Component Value Date   WBC 5.9 10/18/2023   NEUTROABS 2.8 10/18/2023   HGB 12.2 10/18/2023   HCT 37.5 10/18/2023   MCV 96.2 10/18/2023   PLT 267 10/18/2023   Lab Results  Component Value Date   IRON  44 10/18/2023   TIBC 358 10/18/2023   IRONPCTSAT 12 10/18/2023   Lab Results  Component Value Date   FERRITIN 30 10/18/2023     STUDIES: CT HEAD WO CONTRAST ( ) Result Date: 10/08/2023 CLINICAL DATA:  Severe occipital headache. History of headaches and undergoes local injections. EXAM: CT HEAD WITHOUT CONTRAST TECHNIQUE: Contiguous axial images were obtained from the base of the skull through the vertex without intravenous contrast. RADIATION DOSE REDUCTION: This exam was performed according to the departmental dose-optimization program which includes automated exposure control, adjustment of the mA and/or kV according to patient size and/or use of iterative reconstruction technique. COMPARISON:  10/13/2019 FINDINGS: Brain: Ventricles, cisterns and other CSF spaces are normal. There is moderate chronic ischemic microvascular disease. Old left frontal infarct. No mass, mass effect, shift of midline structures or acute hemorrhage. No evidence of acute infarction. Vascular: No hyperdense vessel or unexpected calcification. Skull: Normal. Negative for fracture or focal lesion. Sinuses/Orbits: Orbits are normal.  Hypoplastic frontal sinuses. Other: None. IMPRESSION: 1. No acute findings. 2. 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 Electronically Signed   By: Roda Cirri M.D.   On:  10/08/2023 14:14    ASSESSMENT: Iron  deficiency anemia.  PLAN:    Iron  deficiency anemia: Resolved.  Patient's hemoglobin and iron  stores continue to be within normal limits.  She does not require additional IV Venofer  today.  Patient last received treatment on March 23, 2023.  No intervention is needed at this time.  Return to clinic in 6 months with repeat laboratory work, further evaluation, and consideration of additional treatment.  If patient is laboratory work continues to remain within normal limits, she likely can be discharged from clinic. Chronic renal insufficiency: Chronic and unchanged.  Continue follow-up with nephrology as scheduled.  Hypertension: Patient's blood pressure is moderately elevated today.  Continue  monitoring and treatment per primary care.  Patient expressed understanding and was in agreement with this plan. She also understands that She can call clinic at any time with any questions, concerns, or complaints.    Shellie Dials, MD   10/19/2023 2:25 PM

## 2023-10-21 DIAGNOSIS — I10 Essential (primary) hypertension: Secondary | ICD-10-CM | POA: Diagnosis not present

## 2023-10-21 DIAGNOSIS — R2689 Other abnormalities of gait and mobility: Secondary | ICD-10-CM | POA: Diagnosis not present

## 2023-10-21 DIAGNOSIS — Z9181 History of falling: Secondary | ICD-10-CM | POA: Diagnosis not present

## 2023-10-21 DIAGNOSIS — M6281 Muscle weakness (generalized): Secondary | ICD-10-CM | POA: Diagnosis not present

## 2023-10-21 DIAGNOSIS — R262 Difficulty in walking, not elsewhere classified: Secondary | ICD-10-CM | POA: Diagnosis not present

## 2023-10-22 ENCOUNTER — Encounter (INDEPENDENT_AMBULATORY_CARE_PROVIDER_SITE_OTHER): Payer: Self-pay | Admitting: Vascular Surgery

## 2023-10-22 DIAGNOSIS — I6529 Occlusion and stenosis of unspecified carotid artery: Secondary | ICD-10-CM | POA: Insufficient documentation

## 2023-10-23 ENCOUNTER — Other Ambulatory Visit: Payer: Self-pay | Admitting: Nurse Practitioner

## 2023-10-24 ENCOUNTER — Encounter: Payer: Self-pay | Admitting: Nurse Practitioner

## 2023-10-24 ENCOUNTER — Ambulatory Visit (INDEPENDENT_AMBULATORY_CARE_PROVIDER_SITE_OTHER): Admitting: Nurse Practitioner

## 2023-10-24 VITALS — BP 132/78 | HR 70 | Temp 97.9°F | Ht 60.0 in | Wt 146.2 lb

## 2023-10-24 DIAGNOSIS — E039 Hypothyroidism, unspecified: Secondary | ICD-10-CM | POA: Diagnosis not present

## 2023-10-24 DIAGNOSIS — G629 Polyneuropathy, unspecified: Secondary | ICD-10-CM | POA: Diagnosis not present

## 2023-10-24 DIAGNOSIS — D631 Anemia in chronic kidney disease: Secondary | ICD-10-CM | POA: Diagnosis not present

## 2023-10-24 DIAGNOSIS — E782 Mixed hyperlipidemia: Secondary | ICD-10-CM

## 2023-10-24 DIAGNOSIS — Z8673 Personal history of transient ischemic attack (TIA), and cerebral infarction without residual deficits: Secondary | ICD-10-CM | POA: Diagnosis not present

## 2023-10-24 DIAGNOSIS — N184 Chronic kidney disease, stage 4 (severe): Secondary | ICD-10-CM

## 2023-10-24 DIAGNOSIS — I272 Pulmonary hypertension, unspecified: Secondary | ICD-10-CM | POA: Diagnosis not present

## 2023-10-24 DIAGNOSIS — I10 Essential (primary) hypertension: Secondary | ICD-10-CM | POA: Diagnosis not present

## 2023-10-24 DIAGNOSIS — F5101 Primary insomnia: Secondary | ICD-10-CM | POA: Diagnosis not present

## 2023-10-24 DIAGNOSIS — M5481 Occipital neuralgia: Secondary | ICD-10-CM | POA: Diagnosis not present

## 2023-10-24 DIAGNOSIS — Z79899 Other long term (current) drug therapy: Secondary | ICD-10-CM

## 2023-10-24 DIAGNOSIS — D692 Other nonthrombocytopenic purpura: Secondary | ICD-10-CM

## 2023-10-24 MED ORDER — TORSEMIDE 20 MG PO TABS: 20.0000 mg | ORAL_TABLET | Freq: Every day | ORAL | 3 refills | Status: AC

## 2023-10-24 MED ORDER — PRAVASTATIN SODIUM 20 MG PO TABS: 20.0000 mg | ORAL_TABLET | Freq: Every evening | ORAL | 4 refills | Status: AC

## 2023-10-24 MED ORDER — CLOPIDOGREL BISULFATE 75 MG PO TABS: 75.0000 mg | ORAL_TABLET | Freq: Every day | ORAL | 4 refills | Status: AC

## 2023-10-24 MED ORDER — ALPRAZOLAM 1 MG PO TABS: ORAL_TABLET | ORAL | 3 refills | Status: DC

## 2023-10-24 NOTE — Assessment & Plan Note (Signed)
 Refer to primary insomnia plan of care.

## 2023-10-24 NOTE — Assessment & Plan Note (Signed)
Chronic, ongoing.  Continues to be followed by neurology.  Will continue this collaboration and medication regimen as ordered by them.  Recent note reviewed.

## 2023-10-24 NOTE — Assessment & Plan Note (Signed)
 Chronic, ongoing for years.  Previous PCP, Dr. Arlana Pouch, placed her on Xanax many years ago and she still takes, reports she cannot sleep without it.  Refuses to try anything else. At this time will continue current regimen and adjust if possible.  Aware of need for every 3 month visits. Controlled substance agreement at next visit and UDS up to date (due next 08/22/24).  Return in 3 months.

## 2023-10-24 NOTE — Patient Instructions (Signed)

## 2023-10-24 NOTE — Assessment & Plan Note (Signed)
 Chronic, improving. Followed by hematology, continue this collaboration and IV iron as prescribed by them.  Recent notes reviewed.

## 2023-10-24 NOTE — Assessment & Plan Note (Signed)
 Chronic, ongoing.  Followed by cardiology, continue current medication regimen as prescribed by them and collaboration.  Recent notes reviewed. Would benefit return to pulmonary in future.

## 2023-10-24 NOTE — Assessment & Plan Note (Signed)
 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  Continue collaboration with neurology and current medication regimen for prevention.  Labs today.  Close monitoring.  BP goal <130/80, A1c <6.5%, and LDL <55.

## 2023-10-24 NOTE — Assessment & Plan Note (Signed)
 Chronic, stable.  BP well below goal for her age.  Recommend she monitor BP at least a few mornings a week at home and document.  DASH diet at home.  Continue Torsemide only.  Collaboration with cardiology - recent note reviewed.  Labs today: up to date.  Urine ALB < 20 May 2023.

## 2023-10-24 NOTE — Assessment & Plan Note (Signed)
 Chronic, ongoing.  Followed by nephrology.  Currently CKD 4.  Educated her on diet and fluid intake + current medications. Urine ALB <20 May 2023.  Recommend continue all current medications and wear compression hose at home on during day and off at night.  Labs up to date with nephrology.

## 2023-10-24 NOTE — Progress Notes (Signed)
 BP 132/78   Pulse 70   Temp 97.9 F (36.6 C) (Oral)   Ht 5' (1.524 m)   Wt 146 lb 3.2 oz (66.3 kg)   SpO2 96%   BMI 28.55 kg/m    Subjective:    Patient ID: Tracie Fisher, female    DOB: 1936-02-25, 88 y.o.   MRN: [id number]  HPI: DASIA GUERRIER is a 88 y.o. female  Chief Complaint  Patient presents with   ER Follow Up   CHRONIC KIDNEY DISEASE Last nephrology visit was 10/02/23, no changes made. Hematology seen for anemia on 10/19/23, no IV iron  required. CKD status: stable Medications renally dose: yes Previous renal evaluation: yes Pneumovax:  Up to Date Influenza Vaccine:  Up to Date    Latest Ref Rng & Units 02/03/2023    2:05 PM 01/02/2023    1:58 PM 12/04/2022    3:51 AM  BMP  Glucose 70 - 99 mg/dL 86  72  91   BUN 8 - 27 mg/dL 23  25  19    Creatinine 0.57 - 1.00 mg/dL 7.82  9.56  2.13   BUN/Creat Ratio 12 - 28 19  21     Sodium 134 - 144 mmol/L 142  140  131   Potassium 3.5 - 5.2 mmol/L 3.8  3.9  3.9   Chloride 96 - 106 mmol/L 102  103  99   CO2 20 - 29 mmol/L 24  21  23    Calcium 8.7 - 10.3 mg/dL 9.1  9.3  8.7     HYPERTENSION / HYPERLIPIDEMIA Continues Metolazone, Plavix , Torsemide , + Pravastatin . Mild pulmonary hypertension. Last saw Dr. Beau Bound with cardiology on 05/01/23. Echo March 2024 65-70%. Vascular visit 10/16/23, uses pumps for lymphedema but has had to use less often as elevating legs more.   Last neurology visit was 10/05/23, when received initial Botox injection. She then went to ER on 10/08/23 due to worsening headache. Work-up in ER reassuring with CT noting no acute findings. Was given Fioricet  in ER, has only taken 2 tablets and they do help.  Takes Lyrica  75 MG TID for peripheral neuropathy, last filled 10/23/23. Satisfied with current treatment? yes Duration of hypertension: chronic BP monitoring frequency: daily BP range: 120/70 range at home BP medication side effects: no Duration of hyperlipidemia: chronic Cholesterol medication side  effects: no Cholesterol supplements: none Medication compliance: good compliance Aspirin: no Recent stressors: no Recurrent headaches: mild headache today to upper occiput right side Visual changes: no Palpitations: no Dyspnea: occasionally a small amount with activity Chest pain: no Lower extremity edema: at baseline, but this has improved Dizzy/lightheaded: no     09/19/2023   10:28 AM 09/13/2022   11:20 AM 07/15/2022   11:31 AM  6CIT Screen  What Year? 0 points 0 points 0 points  What month? 0 points 0 points 0 points  What time? 0 points 0 points 0 points  Count back from 20 4 points 4 points 0 points  Months in reverse 4 points 4 points 4 points  Repeat phrase 6 points 6 points 8 points  Total Score 14 points 14 points 12 points      10/24/2023    2:25 PM 02/03/2023    1:49 PM  MMSE - Mini Mental State Exam  Orientation to time 5 5  Orientation to Place 5 5  Registration 3 3  Attention/ Calculation 5 5  Recall 2 3  Language- name 2 objects 2 2  Language- repeat 1 1  Language- follow 3 step command 3 3  Language- read & follow direction 1 1  Write a sentence 1 1  Copy design 1 1  Total score 29 30    INSOMNIA Taken Xanax  for many years. Dr. Arabella Knife, her previous PCP, started many years ago. Wishes to remain on it -- takes 1/2 tablet Xanax  in morning and 1 tablet at night. Pt is aware of risks of psychoactive medication use to include increased sedation, respiratory suppression, falls, extrapyramidal movements, dependence and cardiovascular events.  Pt would like to continue treatment as benefit determined to outweigh risk.  Last filled on 10/23/23. Duration: chronic Satisfied with sleep quality: yes Difficulty falling asleep: yes Difficulty staying asleep: yes Waking a few hours after sleep onset: no Early morning awakenings: yes Daytime hypersomnolence: no Wakes feeling refreshed: yes Good sleep hygiene: yes Apnea: no Snoring: no Depressed/anxious mood: yes Recent  stress: no Restless legs/nocturnal leg cramps: no Chronic pain/arthritis: yes  HYPOTHYROIDISM Thyroid  control status:stable Satisfied with current treatment? yes Medication side effects: no Medication compliance: good compliance Etiology of hypothyroidism: unknown Recent dose adjustment:no Fatigue: no Cold intolerance: no Heat intolerance:yes, has been very hot recently Weight gain: no Weight loss: no Constipation: no Diarrhea/loose stools: no Palpitations: no Lower extremity edema: at baseline, as above Anxiety/depressed mood: no     10/24/2023    2:02 PM 09/19/2023   10:22 AM 05/25/2023   11:06 AM 02/21/2023   11:34 AM 11/10/2022    9:30 AM  Depression screen PHQ 2/9  Decreased Interest 1 0 0 0 2  Down, Depressed, Hopeless 0 0 0 0 2  PHQ - 2 Score 1 0 0 0 4  Altered sleeping 1 0 0 0 0  Tired, decreased energy 1 0 1 1 1   Change in appetite 0 0 2 0 0  Feeling bad or failure about yourself  0 0 0 0 0  Trouble concentrating 0 0 0 0 0  Moving slowly or fidgety/restless 0 0 0 0 0  Suicidal thoughts 0 0 0 0 0  PHQ-9 Score 3 0 3 1 5   Difficult doing work/chores Somewhat difficult Not difficult at all Not difficult at all Somewhat difficult Not difficult at all       10/24/2023    2:03 PM 05/25/2023   11:06 AM 02/21/2023   11:34 AM 11/10/2022    9:31 AM  GAD 7 : Generalized Anxiety Score  Nervous, Anxious, on Edge 1 0 0 1  Control/stop worrying 1 0 0 0  Worry too much - different things 1 0 0 0  Trouble relaxing 1 0 0 3  Restless 0 0 0 0  Easily annoyed or irritable 0 0 1 0  Afraid - awful might happen 0 0 0 0  Total GAD 7 Score 4 0 1 4  Anxiety Difficulty Not difficult at all Not difficult at all Not difficult at all Not difficult at all   Relevant past medical, surgical, family and social history reviewed and updated as indicated. Interim medical history since our last visit reviewed. Allergies and medications reviewed and updated.  Review of Systems  Constitutional:   Negative for activity change, appetite change, diaphoresis, fatigue and fever.  Respiratory:  Negative for cough, chest tightness and shortness of breath.   Cardiovascular:  Negative for chest pain, palpitations and leg swelling.  Gastrointestinal: Negative.   Neurological: Negative.   Psychiatric/Behavioral:  Negative for decreased concentration, self-injury, sleep disturbance and suicidal ideas.    Per HPI unless specifically  indicated above     Objective:    BP 132/78   Pulse 70   Temp 97.9 F (36.6 C) (Oral)   Ht 5' (1.524 m)   Wt 146 lb 3.2 oz (66.3 kg)   SpO2 96%   BMI 28.55 kg/m   Wt Readings from Last 3 Encounters:  10/24/23 146 lb 3.2 oz (66.3 kg)  10/19/23 147 lb (66.7 kg)  10/16/23 148 lb 9.6 oz (67.4 kg)    Physical Exam Vitals and nursing note reviewed.  Constitutional:      General: She is awake. She is not in acute distress.    Appearance: She is well-developed and well-groomed. She is not ill-appearing or toxic-appearing.  HENT:     Head: Normocephalic.     Right Ear: Hearing and external ear normal.     Left Ear: Hearing and external ear normal.  Eyes:     General: Lids are normal.        Right eye: No discharge.        Left eye: No discharge.     Conjunctiva/sclera: Conjunctivae normal.     Pupils: Pupils are equal, round, and reactive to light.  Neck:     Thyroid : No thyromegaly.     Vascular: No carotid bruit.  Cardiovascular:     Rate and Rhythm: Normal rate and regular rhythm.     Heart sounds: Normal heart sounds. No murmur heard.    No gallop.     Comments: No edema on exam today. Pulmonary:     Effort: Pulmonary effort is normal. No accessory muscle usage or respiratory distress.     Breath sounds: Normal breath sounds.  Abdominal:     General: Bowel sounds are normal. There is no distension.     Palpations: Abdomen is soft.     Tenderness: There is no abdominal tenderness.  Musculoskeletal:     Cervical back: Normal range of motion  and neck supple.     Right lower leg: No edema.     Left lower leg: No edema.  Lymphadenopathy:     Cervical: No cervical adenopathy.  Skin:    General: Skin is warm and dry.     Findings: Bruising present.     Comments: Bilateral upper extremities with bruising scattered.  Neurological:     Mental Status: She is alert and oriented to person, place, and time.     Cranial Nerves: Cranial nerves 2-12 are intact.     Coordination: Coordination is intact.     Gait: Gait is intact.     Deep Tendon Reflexes: Reflexes are normal and symmetric.     Reflex Scores:      Brachioradialis reflexes are 2+ on the right side and 2+ on the left side.      Patellar reflexes are 2+ on the right side and 2+ on the left side. Psychiatric:        Attention and Perception: Attention normal.        Mood and Affect: Mood normal.        Speech: Speech normal.        Behavior: Behavior normal. Behavior is cooperative.        Thought Content: Thought content normal.    Results for orders placed or performed in visit on 10/18/23  Iron  and TIBC([facility])   Collection Time: 10/18/23 10:52 AM  Result Value Ref Range   Iron  44 28 - 170 ug/dL   TIBC 161 096 - 045 ug/dL  Saturation Ratios 12 10.4 - 31.8 %   UIBC 314 ug/dL  Ferritin   Collection Time: 10/18/23 10:52 AM  Result Value Ref Range   Ferritin 30 11 - 307 ng/mL  CBC with Differential/Platelet   Collection Time: 10/18/23 10:52 AM  Result Value Ref Range   WBC 5.9 4.0 - 10.5 K/uL   RBC 3.90 3.87 - 5.11 MIL/uL   Hemoglobin 12.2 12.0 - 15.0 g/dL   HCT 08.6 57.8 - 46.9 %   MCV 96.2 80.0 - 100.0 fL   MCH 31.3 26.0 - 34.0 pg   MCHC 32.5 30.0 - 36.0 g/dL   RDW 62.9 52.8 - 41.3 %   Platelets 267 150 - 400 K/uL   nRBC 0.0 0.0 - 0.2 %   Neutrophils Relative % 46 %   Neutro Abs 2.8 1.7 - 7.7 K/uL   Lymphocytes Relative 32 %   Lymphs Abs 1.9 0.7 - 4.0 K/uL   Monocytes Relative 13 %   Monocytes Absolute 0.7 0.1 - 1.0 K/uL   Eosinophils  Relative 8 %   Eosinophils Absolute 0.4 0.0 - 0.5 K/uL   Basophils Relative 1 %   Basophils Absolute 0.0 0.0 - 0.1 K/uL   Immature Granulocytes 0 %   Abs Immature Granulocytes 0.02 0.00 - 0.07 K/uL      Assessment & Plan:   Problem List Items Addressed This Visit       Cardiovascular and Mediastinum   Senile purpura (HCC)   Noted to upper and lower extremities.  Recommended to patient gentle cleansing daily with soap and water + application of lotion.  Monitor closely for skin breakdown or wounds, immediately notify provider if present.      Relevant Medications   torsemide  (DEMADEX ) 20 MG tablet   pravastatin  (PRAVACHOL ) 20 MG tablet   Moderate pulmonary hypertension (HCC)   Chronic, ongoing.  Followed by cardiology, continue current medication regimen as prescribed by them and collaboration.  Recent notes reviewed. Would benefit return to pulmonary in future.      Relevant Medications   torsemide  (DEMADEX ) 20 MG tablet   pravastatin  (PRAVACHOL ) 20 MG tablet   Essential hypertension   Chronic, stable.  BP well below goal for her age.  Recommend she monitor BP at least a few mornings a week at home and document.  DASH diet at home.  Continue Torsemide  only.  Collaboration with cardiology - recent note reviewed.  Labs today: up to date.  Urine ALB < 20 May 2023.       Relevant Medications   torsemide  (DEMADEX ) 20 MG tablet   pravastatin  (PRAVACHOL ) 20 MG tablet     Endocrine   Hypothyroidism, adult   Chronic, ongoing.  Continue current medication regimen and adjust as needed based on labs.  TSH, Free T4 today due to more issues with feeling hot.      Relevant Orders   TSH   T4, free     Nervous and Auditory   Occipital neuralgia   Chronic, ongoing.  Continues to be followed by neurology.  Will continue this collaboration and medication regimen as ordered by them.  Recent note reviewed.      Relevant Medications   ALPRAZolam  (XANAX ) 1 MG tablet (Start on 11/21/2023)    Neuropathy   Chronic, ongoing.  Continues to be followed by neurology.  Will continue this collaboration and medication regimen as ordered by them.  Recent note reviewed.  Her CrCl was 37 at last visit, would avoid increasing Lyrica .  Genitourinary   CKD (chronic kidney disease) stage 4, GFR 15-29 ml/min (HCC) - Primary   Chronic, ongoing.  Followed by nephrology.  Currently CKD 4.  Educated her on diet and fluid intake + current medications. Urine ALB <20 May 2023.  Recommend continue all current medications and wear compression hose at home on during day and off at night.  Labs up to date with nephrology.        Other   Mixed hyperlipidemia   Chronic, ongoing.  Continue current medication regimen and adjust as needed.  Labs today.      Relevant Medications   torsemide  (DEMADEX ) 20 MG tablet   pravastatin  (PRAVACHOL ) 20 MG tablet   Other Relevant Orders   Lipid Panel w/o Chol/HDL Ratio   Long-term current use of benzodiazepine   Refer to primary insomnia plan of care.      Insomnia   Chronic, ongoing for years.  Previous PCP, Dr. Arabella Knife, placed her on Xanax  many years ago and she still takes, reports she cannot sleep without it.  Refuses to try anything else. At this time will continue current regimen and adjust if possible.  Aware of need for every 3 month visits. Controlled substance agreement at next visit and UDS up to date (due next 08/22/24).  Return in 3 months.      Relevant Medications   ALPRAZolam  (XANAX ) 1 MG tablet (Start on 11/21/2023)   History of stroke   History of CVA, most recent on 08/04/22.  Continue collaboration with neurology and current medication regimen for prevention.  Labs today.  Close monitoring.  BP goal <130/80, A1c <6.5%, and LDL <55.      Anemia due to stage 4 chronic kidney disease (HCC)   Chronic, improving. Followed by hematology, continue this collaboration and IV iron  as prescribed by them.  Recent notes reviewed.          Follow  up plan: Return in about 3 months (around 01/24/2024) for HTN/HLD, Hypothyroid -- can cancel July 9th visit.

## 2023-10-24 NOTE — Telephone Encounter (Signed)
 Requested medication (s) are due for refill today: yes  Requested medication (s) are on the active medication list: yes  Last refill:  4/9/2  Future visit scheduled: no  Notes to clinic:  Unable to refill per protocol, cannot delegate.      Requested Prescriptions  Pending Prescriptions Disp Refills   pregabalin  (LYRICA ) 75 MG capsule [Pharmacy Med Name: PREGABALIN  75 MG CAPSULE] 90 capsule 0    Sig: Take 1 capsule (75 mg total) by mouth 3 (three) times daily.     Not Delegated - Neurology:  Anticonvulsants - Controlled - pregabalin  Failed - 10/24/2023 11:15 AM      Failed - This refill cannot be delegated      Failed - Cr in normal range and within 360 days    Creatinine  Date Value Ref Range Status  02/13/2013 1.09 0.60 - 1.30 mg/dL Final   Creatinine, Ser  Date Value Ref Range Status  02/03/2023 1.20 (H) 0.57 - 1.00 mg/dL Final         Passed - Completed PHQ-2 or PHQ-9 in the last 360 days      Passed - Valid encounter within last 12 months    Recent Outpatient Visits           2 months ago Moderate pulmonary hypertension (HCC)   [place] [facility] [place], Lavelle Posey, NP

## 2023-10-24 NOTE — Assessment & Plan Note (Signed)
 Chronic, ongoing.  Continue current medication regimen and adjust as needed.  Labs today.

## 2023-10-24 NOTE — Assessment & Plan Note (Signed)
 Chronic, ongoing.  Continue current medication regimen and adjust as needed based on labs.  TSH, Free T4 today due to more issues with feeling hot.

## 2023-10-24 NOTE — Assessment & Plan Note (Signed)
 Chronic, ongoing.  Continues to be followed by neurology.  Will continue this collaboration and medication regimen as ordered by them.  Recent note reviewed.  Her CrCl was 37 at last visit, would avoid increasing Lyrica .

## 2023-10-24 NOTE — Assessment & Plan Note (Signed)
Noted to upper and lower extremities.  Recommended to patient gentle cleansing daily with soap and water + application of lotion.  Monitor closely for skin breakdown or wounds, immediately notify provider if present. 

## 2023-10-25 ENCOUNTER — Ambulatory Visit: Payer: Self-pay | Admitting: Nurse Practitioner

## 2023-10-25 ENCOUNTER — Telehealth: Payer: Self-pay

## 2023-10-25 LAB — LIPID PANEL W/O CHOL/HDL RATIO
Cholesterol, Total: 186 mg/dL (ref 100–199)
HDL: 71 mg/dL (ref >39)
LDL Chol Calc (NIH): 91 mg/dL (ref 0–99)
Triglycerides: 143 mg/dL (ref 0–149)
VLDL Cholesterol Cal: 24 mg/dL (ref 5–40)

## 2023-10-25 LAB — TSH: TSH: 1.13 u[IU]/mL (ref 0.450–4.500)

## 2023-10-25 LAB — T4, FREE: Free T4: 1.28 ng/dL (ref 0.82–1.77)

## 2023-10-25 NOTE — Telephone Encounter (Signed)
 Will alert via lab message

## 2023-10-25 NOTE — Progress Notes (Signed)
 Please let Orva know thyroid  labs and lipid panel remain stable.  No medication changes for these needed.  For the left thumb pain continue Tylenol  and may place Icy/Hot or Voltaren gel to area.  If ongoing or worsening come in for visit.:)

## 2023-10-25 NOTE — Telephone Encounter (Signed)
 Copied from CRM [id number]. Topic: Clinical - Lab/Test Results >> Oct 25, 2023 12:52 PM Georgeann Kindred wrote: Reason for CRM: Patient is requesting a call regarding her labs once results are in. Patient states that she forgot to mention that her left thumb hurts ans she has been taking 2 Tylenol  4 times a day for the pain but it doesn't get to the pain.  Please contact patient at [phone]

## 2023-11-02 DIAGNOSIS — G8929 Other chronic pain: Secondary | ICD-10-CM | POA: Diagnosis not present

## 2023-11-02 DIAGNOSIS — M25561 Pain in right knee: Secondary | ICD-10-CM | POA: Diagnosis not present

## 2023-11-08 ENCOUNTER — Ambulatory Visit: Payer: Self-pay

## 2023-11-08 NOTE — Telephone Encounter (Signed)
Provider alerted.

## 2023-11-08 NOTE — Telephone Encounter (Signed)
 FYI Only or Action Required?: FYI only for provider.  Patient was last seen in primary care on 10/24/2023 by [name], NP. Called Nurse Triage reporting Nasal Congestion. Symptoms began several weeks ago. Interventions attempted: OTC medications: cough syrup. Symptoms are: rapidly worsening.  Triage Disposition: Go to ED Now (Notify PCP)  Patient/caregiver understands and will follow disposition?: Yes   FYI- This RN noted patient having increased difficulty breathing while oncall. Initially refusing ED, call made to [facility] for assistance, no appts avail within next 4 hours. Pt now speaking in short phrases.  EMS alerted.     Copied from CRM [id number]. Topic: Clinical - Medical Advice >> Nov 08, 2023 11:05 AM Turkey B wrote: Reason for CRM: pt has congestion in her throat, can't get it out when she coughs. Pt didn't want to come out in heat for appt. She wants to know can something be sent to pharmacy Reason for Disposition  [1] MODERATE difficulty breathing (e.g., speaks in phrases, SOB even at rest, pulse 100-120) AND [2] still present when not coughing  Answer Assessment - Initial Assessment Questions 1. ONSET: When did the cough begin?      Cough started 2 weeks now  2. SEVERITY: How bad is the cough today?      Persistent  3. SPUTUM: Describe the color of your sputum (none, dry cough; clear, white, yellow, green)     Dry cough  4. HEMOPTYSIS: Are you coughing up any blood? If so ask: How much? (flecks, streaks, tablespoons, etc.)     No  5. DIFFICULTY BREATHING: Are you having difficulty breathing? If Yes, ask: How bad is it? (e.g., mild, moderate, severe)    - MILD: No SOB at rest, mild SOB with walking, speaks normally in sentences, can lie down, no retractions, pulse < 100.    - MODERATE: SOB at rest, SOB with minimal exertion and prefers to sit, cannot lie down flat, speaks in phrases, mild retractions, audible wheezing, pulse 100-120.    - SEVERE: Very  SOB at rest, speaks in single words, struggling to breathe, sitting hunched forward, retractions, pulse > 120      Mild shortness of breath, gets winded when walking from kitchen to bathroom  6. FEVER: Do you have a fever? If Yes, ask: What is your temperature, how was it measured, and when did it start?     No  7. CARDIAC HISTORY: Do you have any history of heart disease? (e.g., heart attack, congestive heart failure)      HLD, pulmonary HTN  8. LUNG HISTORY: Do you have any history of lung disease?  (e.g., pulmonary embolus, asthma, emphysema)     No  9. PE RISK FACTORS: Do you have a history of blood clots? (or: recent major surgery, recent prolonged travel, bedridden)     No  10. OTHER SYMPTOMS: Do you have any other symptoms? (e.g., runny nose, wheezing, chest pain)       Congestion in throat and right side headache  11. PREGNANCY: Is there any chance you are pregnant? When was your last menstrual period?       No 12. TRAVEL: Have you traveled out of the country in the last month? (e.g., travel history, exposures)       No  Protocols used: Cough - Acute Productive-A-AH

## 2023-11-09 ENCOUNTER — Ambulatory Visit (INDEPENDENT_AMBULATORY_CARE_PROVIDER_SITE_OTHER): Admitting: Pediatrics

## 2023-11-09 ENCOUNTER — Encounter: Payer: Self-pay | Admitting: Pediatrics

## 2023-11-09 VITALS — BP 148/81 | HR 63 | Temp 97.9°F

## 2023-11-09 DIAGNOSIS — I10 Essential (primary) hypertension: Secondary | ICD-10-CM | POA: Diagnosis not present

## 2023-11-09 DIAGNOSIS — J040 Acute laryngitis: Secondary | ICD-10-CM

## 2023-11-09 MED ORDER — METHYLPREDNISOLONE 4 MG PO TBPK: ORAL_TABLET | ORAL | 0 refills | Status: DC

## 2023-11-09 NOTE — Progress Notes (Signed)
 Office Visit  BP (!) 148/81   Pulse 63   Temp 97.9 F (36.6 C) (Oral)   SpO2 98%    Subjective:    Patient ID: Tracie Fisher, female    DOB: [birth year], 88 y.o.   MRN: [id number]  HPI: Tracie Fisher is a 88 y.o. female  Chief Complaint  Patient presents with   mucus    Has had mucus in her throat for several weeks no relief     #horse voice and congestion Patient reports several weeks of changed voice and congestion Also intermittent throat pain She's not sure about shortness of breath, notes no change from her baseline Denies fevers, chills, nausea, vomiting She's not sure if she had a cold recently Has not tried anything for these symptoms. She has had a lot of other issues so forgot to bring this up to her pcp during recent visit  Relevant past medical, surgical, family and social history reviewed and updated as indicated. Interim medical history since our last visit reviewed. Allergies and medications reviewed and updated.  ROS per HPI unless specifically indicated above     Objective:    BP (!) 148/81   Pulse 63   Temp 97.9 F (36.6 C) (Oral)   SpO2 98%   Wt Readings from Last 3 Encounters:  10/24/23 146 lb 3.2 oz (66.3 kg)  10/19/23 147 lb (66.7 kg)  10/16/23 148 lb 9.6 oz (67.4 kg)     Physical Exam Constitutional:      Appearance: Normal appearance.  HENT:     Head: Normocephalic and atraumatic.     Mouth/Throat:     Pharynx: Posterior oropharyngeal erythema present. No pharyngeal swelling or oropharyngeal exudate.   Eyes:     Pupils: Pupils are equal, round, and reactive to light.    Cardiovascular:     Rate and Rhythm: Normal rate and regular rhythm.     Pulses: Normal pulses.     Heart sounds: Normal heart sounds.  Pulmonary:     Effort: Pulmonary effort is normal.     Breath sounds: Normal breath sounds.  Abdominal:     General: Abdomen is flat.     Palpations: Abdomen is soft.   Musculoskeletal:        General: Normal range of  motion.     Cervical back: Normal range of motion.   Skin:    General: Skin is warm and dry.     Capillary Refill: Capillary refill takes less than 2 seconds.   Neurological:     General: No focal deficit present.     Mental Status: She is alert. Mental status is at baseline.   Psychiatric:        Mood and Affect: Mood normal.        Behavior: Behavior normal.         11/09/2023    9:21 AM 10/24/2023    2:02 PM 09/19/2023   10:22 AM 05/25/2023   11:06 AM 02/21/2023   11:34 AM  Depression screen PHQ 2/9  Decreased Interest 0 1 0 0 0  Down, Depressed, Hopeless 0 0 0 0 0  PHQ - 2 Score 0 1 0 0 0  Altered sleeping 0 1 0 0 0  Tired, decreased energy 0 1 0 1 1  Change in appetite 0 0 0 2 0  Feeling bad or failure about yourself  0 0 0 0 0  Trouble concentrating 0 0 0 0 0  Moving slowly or  fidgety/restless 0 0 0 0 0  Suicidal thoughts 0 0 0 0 0  PHQ-9 Score 0 3 0 3 1  Difficult doing work/chores Not difficult at all Somewhat difficult Not difficult at all Not difficult at all Somewhat difficult       11/09/2023    9:21 AM 10/24/2023    2:03 PM 05/25/2023   11:06 AM 02/21/2023   11:34 AM  GAD 7 : Generalized Anxiety Score  Nervous, Anxious, on Edge 0 1 0 0  Control/stop worrying 0 1 0 0  Worry too much - different things 0 1 0 0  Trouble relaxing 0 1 0 0  Restless 0 0 0 0  Easily annoyed or irritable 0 0 0 1  Afraid - awful might happen 0 0 0 0  Total GAD 7 Score 0 4 0 1  Anxiety Difficulty Not difficult at all Not difficult at all Not difficult at all Not difficult at all       Assessment & Plan:  Assessment & Plan   Laryngitis Ongoing for 2-3 weeks. Suspect from post nasal drip vs sequela of viral infection weeks ago. Offered conservative OTC tx including OTC allergy meds but would like to try steroid pack given how bothersome sx have been. Consider ENT referral if persistent.  -     methylPREDNISolone ; Follow instructions on pack  Dispense: 21 each; Refill:  0  Essential hypertension Assessment & Plan: Elevated today suspect due to laryngitis but will have her f/u w pcp in a week in case med adjustments needed.   Follow up plan: Return in about 1 week (around 11/16/2023) for follow up.  Tracie SHAUNNA Nett, MD

## 2023-11-09 NOTE — Patient Instructions (Signed)
 Start medrol for your throat and follow up with Jolene next week at 10am

## 2023-11-11 NOTE — Patient Instructions (Signed)

## 2023-11-12 ENCOUNTER — Encounter: Payer: Self-pay | Admitting: Pediatrics

## 2023-11-12 NOTE — Assessment & Plan Note (Signed)
 Elevated today suspect due to laryngitis but will have her f/u w pcp in a week in case med adjustments needed.

## 2023-11-14 ENCOUNTER — Ambulatory Visit: Attending: Orthopedic Surgery

## 2023-11-14 DIAGNOSIS — R269 Unspecified abnormalities of gait and mobility: Secondary | ICD-10-CM | POA: Insufficient documentation

## 2023-11-14 DIAGNOSIS — M6281 Muscle weakness (generalized): Secondary | ICD-10-CM | POA: Diagnosis not present

## 2023-11-14 DIAGNOSIS — R262 Difficulty in walking, not elsewhere classified: Secondary | ICD-10-CM | POA: Diagnosis not present

## 2023-11-14 DIAGNOSIS — R278 Other lack of coordination: Secondary | ICD-10-CM | POA: Diagnosis not present

## 2023-11-14 DIAGNOSIS — M25561 Pain in right knee: Secondary | ICD-10-CM | POA: Diagnosis not present

## 2023-11-14 DIAGNOSIS — R2681 Unsteadiness on feet: Secondary | ICD-10-CM | POA: Insufficient documentation

## 2023-11-14 NOTE — Therapy (Signed)
 OUTPATIENT PHYSICAL THERAPY LOWER EXTREMITY EVALUATION   Patient Name: Tracie Fisher MRN: [id number] DOB:Mar 31, 1936, 88 y.o., female Today's Date: 11/14/2023  END OF SESSION:  PT End of Session - 11/14/23 1123     Visit Number 1    Number of Visits 16    Date for PT Re-Evaluation 01/09/24    Authorization Type [employer]    Authorization Time Period 11/14/23-01/09/24    Progress Note Due on Visit 10    PT Start Time 1100    PT Stop Time 1140    PT Time Calculation (min) 40 min    Activity Tolerance Patient tolerated treatment well;No increased pain    Behavior During Therapy WFL for tasks assessed/performed          Past Medical History:  Diagnosis Date   Cancer (HCC)    SKIN   Complication of anesthesia    Depression    Dysrhythmia    GERD (gastroesophageal reflux disease)    Hiatal hernia    HOH (hard of hearing)    Hypertension    Hypothyroidism    PONV (postoperative nausea and vomiting)    Thyroid  disease    Past Surgical History:  Procedure Laterality Date   APPENDECTOMY     BACK SURGERY     MULTIPLE/ROD IN BACK   BREAST CYST ASPIRATION Left years ago   CATARACT EXTRACTION W/PHACO Left 03/21/2017   Procedure: CATARACT EXTRACTION PHACO AND INTRAOCULAR LENS PLACEMENT (IOC);  Surgeon: Jaye Fallow, MD;  Location: [facility];  Service: Ophthalmology;  Laterality: Left;  US  00:48 AP% 17.8 CDE 8.57 Fluid pack lot # [id number]   CATARACT EXTRACTION W/PHACO Right 04/11/2017   Procedure: CATARACT EXTRACTION PHACO AND INTRAOCULAR LENS PLACEMENT (IOC);  Surgeon: Jaye Fallow, MD;  Location: [facility];  Service: Ophthalmology;  Laterality: Right;  US  01:09.5 AP% 15.8 CDE 11.04 Fluid Pack lot # [id number]   CHOLECYSTECTOMY     COLON SURGERY     BOWEL OBSTRUCTION   COLONOSCOPY WITH PROPOFOL  N/A 04/25/2016   Procedure: COLONOSCOPY WITH PROPOFOL ;  Surgeon: Ruel Kung, MD;  Location: [facility];  Service: Endoscopy;  Laterality: N/A;    ESOPHAGOGASTRODUODENOSCOPY (EGD) WITH PROPOFOL  N/A 04/25/2016   Procedure: ESOPHAGOGASTRODUODENOSCOPY (EGD) WITH PROPOFOL ;  Surgeon: Ruel Kung, MD;  Location: [facility];  Service: Endoscopy;  Laterality: N/A;   EXPLORATORY LAPAROTOMY  10/18/2011   blockage small intestines   GIVENS CAPSULE STUDY N/A 05/11/2016   Procedure: GIVENS CAPSULE STUDY;  Surgeon: Ruel Kung, MD;  Location: [facility];  Service: Endoscopy;  Laterality: N/A;   Patient Active Problem List   Diagnosis Date Noted   Carotid stenosis 10/22/2023   Long-term current use of benzodiazepine 08/23/2023   Lymphedema 03/29/2023   Anemia due to stage 4 chronic kidney disease (HCC) 02/04/2023   Overweight (BMI 25.0-29.9) 08/05/2022   Vision loss 08/04/2022   Senile purpura (HCC) 06/16/2022   Insomnia 06/16/2022   Varicose veins of both lower extremities without ulcer or inflammation 06/16/2022   Mixed hyperlipidemia 06/12/2022   Occipital neuralgia 06/12/2022   Sensory ataxia 06/12/2022   Osteopenia of left forearm 06/12/2022   CMC arthritis 05/23/2022   CKD (chronic kidney disease) stage 4, GFR 15-29 ml/min (HCC) 03/03/2022   Hypothyroidism, adult 03/01/2022   Loss of memory 07/26/2021   Moderate pulmonary hypertension (HCC) 05/20/2021   Primary osteoarthritis involving multiple joints 03/24/2021   Unsteady gait 10/16/2019   History of stroke 03/04/2019   Bilateral foot pain 03/06/2018   Chronic bilateral low back pain  03/06/2018   Neck pain 03/06/2018   Neuropathy 02/06/2018   History of nonmelanoma skin cancer 09/27/2016   Essential hypertension 09/02/2016   Hiatal hernia    Stricture of esophagus    Iron  deficiency anemia, unspecified 03/23/2016   History of tachycardia 07/29/2015    PCP: ***  REFERRING PROVIDER: ***  REFERRING DIAG: ***  THERAPY DIAG:  Acute pain of right knee  Difficulty in walking, not elsewhere classified  Rationale for Evaluation and Treatment: {HABREHAB:27488}  ONSET  DATE: ***  SUBJECTIVE:   SUBJECTIVE STATEMENT: ***  PERTINENT HISTORY: *** PAIN:  Are you having pain? {OPRCPAIN:27236}  PRECAUTIONS: {Therapy precautions:24002}  RED FLAGS: {PT Red Flags:29287}   WEIGHT BEARING RESTRICTIONS: {Yes ***/No:24003}  FALLS:  Has patient fallen in last 6 months? {fallsyesno:27318}  LIVING ENVIRONMENT: Lives with: {[facility] lives with:25569::lives with their family} Lives in: {Lives in:25570} Stairs: {opstairs:27293} Has following equipment at home: {Assistive devices:23999}  OCCUPATION: ***  PLOF: {PLOF:24004}  PATIENT GOALS: ***  NEXT MD VISIT: ***  OBJECTIVE:  Note: Objective measures were completed at evaluation unless otherwise noted.  DIAGNOSTIC FINDINGS:   Recently seen by orthopedics. PATIENT SURVEYS:  Attempted LEFS and KOOD, neither of which were compatible with pt's self-report capacity.  COGNITION: Overall cognitive status: globally WNL, mentation somewhat limiting to ability to response to LEFS/KOOS in a meaningful way.   EDEMA:  None seen upon visual inspection  PALPATION: Rt knee extensor mechanism; not tender, no focal or global edema/bogginess, healthy muscle mass JOINT RANGE:   ROM Right eval Left eval  Knee flexion >110*   Knee extension full full   *pt reports not limited, flexoin at baseline LOWER EXTREMITY SPECIAL TESTS:  None   FUNCTIONAL TESTS:  5xSTS: 20sec, hands free, no LOB, mild antalgia reps 4-5  GAIT: Distance walked: 22 meters Assistive device utilized: SPC  Level of assistance: ModI  Comments: slow, appears to be limited more by baseline balance deficits                                                                                                                              TODAY'S TREATMENT 11/14/23 :   -moist heat application to Right knee/quads  -Rt SAQ 2x15 -hooklying bridge 1x10 (low range)  -standing double heel raise x15 (supported by chair)  -short sitting LAQ 1x15 Rt *all  mostly pain free  PATIENT EDUCATION:  Education details: Since bridging is mildly aggravating to back pain, maintain exercise in low range at home as not to further exacerbate back issues. Person educated: Water quality scientist: collaborative learning, deliberate practice, positive reinforcement, explicit instruction, establish rules. Education comprehension: seems ok; FU in future to determine LT comprehension/adherence  HOME EXERCISE PROGRAM: Access Code: [email or web address] URL: [email or web address] Date: 11/14/2023 Prepared by: Peggye Linear  Exercises - Supine Knee Extension Strengthening  - 2 x daily - 1 sets - 15 reps - Supine Bridge  - 2 x daily - 1 sets - 10 reps (low  range as not to aggravate chronic low back pain)  - Seated Long Arc Quad  - 2 x daily - 1 sets - 10 reps - Heel Raises with Counter Support  - 2 x daily - 1 sets - 15 reps  ASSESSMENT:  CLINICAL IMPRESSION: 88yoF referred to [facility] via orthopedics for acute onset Right knee pain. Exam not remarkable for any appreciable ROM limitations or concerning edema. PT able to partake in many mobility assessments today without antalgia, although pt has heavy mobility impairment from other baseline medical problems. Pt tolerates simple exercises this date for activation of muscles about the knee joint, particularly the extensor mechanism, no pain during treatment, nor after. A home exercise program is developed based on pt performance in clinic today, is issued with paper handout. Patient will benefit from skilled physical therapy intervention to reduce deficits and impairments identified in evaluation, in order to reduce pain, improve quality of life, and maximize activity tolerance for ADL, IADL, and leisure/fitness. Physical therapy will help pt achieve long and short term goals of care.     OBJECTIVE IMPAIRMENTS: Decreased knowledge of condition, decreased use of DME, decreased mobility, difficulty walking, decreased  strength.  ACTIVITY LIMITATIONS: Lifting, standing, walking, squatting, transfers, locomotion level PARTICIPATION LIMITATIONS: Cleaning, laundry, interpersonal relationships, driving, yardwork, community activity.  PERSONAL FACTORS: Age, behavior pattern, education, past/current experiences, transportation, profession  are also affecting patient's functional outcome.  REHAB POTENTIAL: Good.  CLINICAL DECISION MAKING: moderate  EVALUATION COMPLEXITY: Stable/uncomplicated   GOALS: Goals reviewed with patient? No  SHORT TERM GOALS: Target date: 12/15/23 Patient will report comprehension, confidence, and consistent compliance and of a simple home exercise program established to facilitate symptoms management and basic strengthening and/or segment mobility.  Baseline: issued at visit 1 Goal status: INITIAL  2.  Pt to demonstrate improvement in transfers power AEB 5xSTS<17seconds. Baseline: 5XSTS 20sec on visit 1 Goal status: INITIAL  LONG TERM GOALS: Target date: 01/15/24 Pt to demonstrate ability to perform without pain limitation, less than 2/10 increase in NPRS, and with >10% improvement in distance to improve ability to participate in IADL and social events.   Baseline: pending assessment visit 2  Goal status: INITIAL  2.  Patient to demonstrate improved performance on initial transfers assessment AEB improved reps per time or time per perform desired reps and/or reduced seat height and/or use of hands in order to improve safety, tolerance, and independence in ADL performance.   Baseline: visit 1 5xSTS: 20sec Goal status: INITIAL  3.  Pt to report ability to perform housework an dlighthousehold activities ad lib without painful restrictions to these activities.  Baseline: limited by pain at eval Goal status: INITIAL  PLAN:  PT FREQUENCY: 1-2x/week  PT DURATION: 8 weeks  PLANNED INTERVENTIONS: 97110-Therapeutic exercises, 97530- Therapeutic activity, 97112- Neuromuscular  re-education, 97535- Self Care, 02859- Manual therapy, G0283- Electrical stimulation (unattended), [phone]- Electrical stimulation (manual), Patient/Family education, Balance training, Stair training, Joint mobilization, Joint manipulation, Visual/preceptual remediation/compensation, DME instructions, Cryotherapy, and Moist heat  PLAN FOR NEXT SESSION: HEP review (symptoms response),  11:37 AM, 11/14/23 Peggye JAYSON Linear, PT, DPT Physical Therapist - [facility]  Outpatient Physical Therapy- Main Campus [phone]     Athanasia Stanwood C, PT 11/14/2023, 11:31 AM

## 2023-11-15 ENCOUNTER — Ambulatory Visit (INDEPENDENT_AMBULATORY_CARE_PROVIDER_SITE_OTHER): Admitting: Nurse Practitioner

## 2023-11-15 ENCOUNTER — Encounter: Payer: Self-pay | Admitting: Nurse Practitioner

## 2023-11-15 VITALS — BP 131/77 | HR 66 | Temp 97.8°F | Ht 60.0 in | Wt 142.0 lb

## 2023-11-15 DIAGNOSIS — M5481 Occipital neuralgia: Secondary | ICD-10-CM | POA: Diagnosis not present

## 2023-11-15 DIAGNOSIS — M79641 Pain in right hand: Secondary | ICD-10-CM | POA: Diagnosis not present

## 2023-11-15 DIAGNOSIS — I10 Essential (primary) hypertension: Secondary | ICD-10-CM

## 2023-11-15 DIAGNOSIS — M79642 Pain in left hand: Secondary | ICD-10-CM | POA: Diagnosis not present

## 2023-11-15 MED ORDER — LEVOTHYROXINE SODIUM 50 MCG PO TABS: 50.0000 ug | ORAL_TABLET | Freq: Every day | ORAL | 4 refills | Status: AC

## 2023-11-15 NOTE — Assessment & Plan Note (Signed)
Chronic, ongoing.  Continues to be followed by neurology.  Will continue this collaboration and medication regimen as ordered by them.  Recent note reviewed.

## 2023-11-15 NOTE — Assessment & Plan Note (Signed)
 Chronic, stable.  BP at goal for her age in office, some elevations recently with taking steroid taper.  Recommend she monitor BP at least a few mornings a week at home and document.  DASH diet at home.  Continue Torsemide  only.  Collaboration with cardiology - recent note reviewed.  Labs today: up to date.  Urine ALB < 20 May 2023.

## 2023-11-15 NOTE — Progress Notes (Signed)
 BP 131/77   Pulse 66   Temp 97.8 F (36.6 C) (Oral)   Ht 5' (1.524 m)   Wt 142 lb (64.4 kg)   SpO2 97%   BMI 27.73 kg/m    Subjective:    Patient ID: Tracie Fisher, female    DOB: [birth year], 88 y.o.   MRN: [id number]  HPI: Tracie Fisher is a 88 y.o. female  Chief Complaint  Patient presents with   Hypertension   HYPERTENSION with Chronic Kidney Disease Presents today for follow-up on BP, reports still having occasional elevations at home since being on steroid taper, she just completed this.  Recent [employer] home visit her BP was 100/62. Last imaging of head was in ER on 10/08/23 when she presented for headache after receiving Botox injections. CT noted no acute issues, but did show her old left frontal infarct.   Would like a referral to ortho to discuss her carpal tunnel and OA in hands, this is getting worse. Reports when she goes to bed she holds her hands. Hypertension status: stable  Satisfied with current treatment? yes Duration of hypertension: chronic BP monitoring frequency:  daily BP range: 111/74 to 161/85 BP medication side effects:  no Medication compliance: good compliance Aspirin: yes Recurrent headaches: no Visual changes: no Palpitations: no Dyspnea: no Chest pain: no Lower extremity edema: no Dizzy/lightheaded: no   Relevant past medical, surgical, family and social history reviewed and updated as indicated. Interim medical history since our last visit reviewed. Allergies and medications reviewed and updated.  Review of Systems  Constitutional:  Negative for activity change, appetite change, diaphoresis, fatigue and fever.  Respiratory:  Negative for cough, chest tightness and shortness of breath.   Cardiovascular:  Negative for chest pain, palpitations and leg swelling.  Gastrointestinal: Negative.   Musculoskeletal:  Positive for arthralgias.  Neurological: Negative.   Psychiatric/Behavioral: Negative.      Per HPI unless specifically  indicated above     Objective:    BP 131/77   Pulse 66   Temp 97.8 F (36.6 C) (Oral)   Ht 5' (1.524 m)   Wt 142 lb (64.4 kg)   SpO2 97%   BMI 27.73 kg/m   Wt Readings from Last 3 Encounters:  11/15/23 142 lb (64.4 kg)  10/24/23 146 lb 3.2 oz (66.3 kg)  10/19/23 147 lb (66.7 kg)    Physical Exam Vitals and nursing note reviewed.  Constitutional:      General: She is awake. She is not in acute distress.    Appearance: She is well-developed and well-groomed. She is not ill-appearing or toxic-appearing.  HENT:     Head: Normocephalic.     Right Ear: Hearing and external ear normal.     Left Ear: Hearing and external ear normal.  Eyes:     General: Lids are normal.        Right eye: No discharge.        Left eye: No discharge.     Conjunctiva/sclera: Conjunctivae normal.     Pupils: Pupils are equal, round, and reactive to light.  Neck:     Thyroid : No thyromegaly.     Vascular: No carotid bruit.  Cardiovascular:     Rate and Rhythm: Normal rate and regular rhythm.     Heart sounds: Normal heart sounds. No murmur heard.    No gallop.  Pulmonary:     Effort: Pulmonary effort is normal. No accessory muscle usage or respiratory distress.  Breath sounds: Normal breath sounds.  Abdominal:     General: Bowel sounds are normal. There is no distension.     Palpations: Abdomen is soft.     Tenderness: There is no abdominal tenderness.  Musculoskeletal:     Cervical back: Normal range of motion and neck supple.     Right lower leg: No edema.     Left lower leg: No edema.  Lymphadenopathy:     Cervical: No cervical adenopathy.  Skin:    General: Skin is warm and dry.     Findings: Bruising present.     Comments: To bilateral upper extremities  Neurological:     Mental Status: She is alert and oriented to person, place, and time.     Deep Tendon Reflexes: Reflexes are normal and symmetric.     Reflex Scores:      Brachioradialis reflexes are 2+ on the right side and  2+ on the left side.      Patellar reflexes are 2+ on the right side and 2+ on the left side. Psychiatric:        Attention and Perception: Attention normal.        Mood and Affect: Mood normal.        Speech: Speech normal.        Behavior: Behavior normal. Behavior is cooperative.        Thought Content: Thought content normal.    Results for orders placed or performed in visit on 10/24/23  TSH   Collection Time: 10/24/23  2:43 PM  Result Value Ref Range   TSH 1.130 0.450 - 4.500 uIU/mL  T4, free   Collection Time: 10/24/23  2:43 PM  Result Value Ref Range   Free T4 1.28 0.82 - 1.77 ng/dL  Lipid Panel w/o Chol/HDL Ratio   Collection Time: 10/24/23  2:43 PM  Result Value Ref Range   Cholesterol, Total 186 100 - 199 mg/dL   Triglycerides 856 0 - 149 mg/dL   HDL 71 >60 mg/dL   VLDL Cholesterol Cal 24 5 - 40 mg/dL   LDL Chol Calc (NIH) 91 0 - 99 mg/dL      Assessment & Plan:   Problem List Items Addressed This Visit       Cardiovascular and Mediastinum   Essential hypertension - Primary   Chronic, stable.  BP at goal for her age in office, some elevations recently with taking steroid taper.  Recommend she monitor BP at least a few mornings a week at home and document.  DASH diet at home.  Continue Torsemide  only.  Collaboration with cardiology - recent note reviewed.  Labs today: up to date.  Urine ALB < 20 May 2023.       Relevant Medications   levothyroxine  (SYNTHROID ) 50 MCG tablet     Nervous and Auditory   Occipital neuralgia   Chronic, ongoing.  Continues to be followed by neurology.  Will continue this collaboration and medication regimen as ordered by them.  Recent note reviewed.      Other Visit Diagnoses       Pain in both hands       Referral to ortho placed.   Relevant Orders   Ambulatory referral to Orthopedic Surgery        Follow up plan: Return for as scheduled in September .

## 2023-11-16 ENCOUNTER — Ambulatory Visit

## 2023-11-16 DIAGNOSIS — R262 Difficulty in walking, not elsewhere classified: Secondary | ICD-10-CM

## 2023-11-16 DIAGNOSIS — R2681 Unsteadiness on feet: Secondary | ICD-10-CM | POA: Diagnosis not present

## 2023-11-16 DIAGNOSIS — R278 Other lack of coordination: Secondary | ICD-10-CM | POA: Diagnosis not present

## 2023-11-16 DIAGNOSIS — R269 Unspecified abnormalities of gait and mobility: Secondary | ICD-10-CM | POA: Diagnosis not present

## 2023-11-16 DIAGNOSIS — M25561 Pain in right knee: Secondary | ICD-10-CM | POA: Diagnosis not present

## 2023-11-16 DIAGNOSIS — M6281 Muscle weakness (generalized): Secondary | ICD-10-CM | POA: Diagnosis not present

## 2023-11-16 NOTE — Therapy (Signed)
 OUTPATIENT PHYSICAL THERAPY TREATMENT  Patient Name: Tracie Fisher MRN: [id number] DOB:January 21, 1936, 88 y.o., female Today's Date: 11/16/2023  END OF SESSION:  PT End of Session - 11/16/23 1102     Visit Number 2    Number of Visits 16    Date for PT Re-Evaluation 01/09/24    Authorization Type [employer] Medicare    Authorization Time Period 11/14/23-01/09/24    Progress Note Due on Visit 10    PT Start Time 1100    PT Stop Time 1140    PT Time Calculation (min) 40 min    Equipment Utilized During Treatment Gait belt    Activity Tolerance Patient tolerated treatment well;No increased pain    Behavior During Therapy WFL for tasks assessed/performed          Past Medical History:  Diagnosis Date   Cancer (HCC)    SKIN   Complication of anesthesia    Depression    Dysrhythmia    GERD (gastroesophageal reflux disease)    Hiatal hernia    HOH (hard of hearing)    Hypertension    Hypothyroidism    PONV (postoperative nausea and vomiting)    Thyroid  disease    Past Surgical History:  Procedure Laterality Date   APPENDECTOMY     BACK SURGERY     MULTIPLE/ROD IN BACK   BREAST CYST ASPIRATION Left years ago   CATARACT EXTRACTION W/PHACO Left 03/21/2017   Procedure: CATARACT EXTRACTION PHACO AND INTRAOCULAR LENS PLACEMENT (IOC);  Surgeon: Jaye Fallow, MD;  Location: [facility];  Service: Ophthalmology;  Laterality: Left;  US  00:48 AP% 17.8 CDE 8.57 Fluid pack lot # [id number]   CATARACT EXTRACTION W/PHACO Right 04/11/2017   Procedure: CATARACT EXTRACTION PHACO AND INTRAOCULAR LENS PLACEMENT (IOC);  Surgeon: Jaye Fallow, MD;  Location: [facility];  Service: Ophthalmology;  Laterality: Right;  US  01:09.5 AP% 15.8 CDE 11.04 Fluid Pack lot # [id number]   CHOLECYSTECTOMY     COLON SURGERY     BOWEL OBSTRUCTION   COLONOSCOPY WITH PROPOFOL  N/A 04/25/2016   Procedure: COLONOSCOPY WITH PROPOFOL ;  Surgeon: Ruel Kung, MD;  Location: [facility];  Service: Endoscopy;  Laterality:  N/A;   ESOPHAGOGASTRODUODENOSCOPY (EGD) WITH PROPOFOL  N/A 04/25/2016   Procedure: ESOPHAGOGASTRODUODENOSCOPY (EGD) WITH PROPOFOL ;  Surgeon: Ruel Kung, MD;  Location: [facility];  Service: Endoscopy;  Laterality: N/A;   EXPLORATORY LAPAROTOMY  10/18/2011   blockage small intestines   GIVENS CAPSULE STUDY N/A 05/11/2016   Procedure: GIVENS CAPSULE STUDY;  Surgeon: Ruel Kung, MD;  Location: [facility];  Service: Endoscopy;  Laterality: N/A;   Patient Active Problem List   Diagnosis Date Noted   Carotid stenosis 10/22/2023   Long-term current use of benzodiazepine 08/23/2023   Lymphedema 03/29/2023   Anemia due to stage 4 chronic kidney disease (HCC) 02/04/2023   Overweight (BMI 25.0-29.9) 08/05/2022   Vision loss 08/04/2022   Senile purpura (HCC) 06/16/2022   Insomnia 06/16/2022   Varicose veins of both lower extremities without ulcer or inflammation 06/16/2022   Mixed hyperlipidemia 06/12/2022   Occipital neuralgia 06/12/2022   Sensory ataxia 06/12/2022   Osteopenia of left forearm 06/12/2022   CMC arthritis 05/23/2022   CKD (chronic kidney disease) stage 4, GFR 15-29 ml/min (HCC) 03/03/2022   Hypothyroidism, adult 03/01/2022   Loss of memory 07/26/2021   Moderate pulmonary hypertension (HCC) 05/20/2021   Primary osteoarthritis involving multiple joints 03/24/2021   Unsteady gait 10/16/2019   History of stroke 03/04/2019   Bilateral foot pain 03/06/2018  Chronic bilateral low back pain 03/06/2018   Neck pain 03/06/2018   Neuropathy 02/06/2018   History of nonmelanoma skin cancer 09/27/2016   Essential hypertension 09/02/2016   Hiatal hernia    Stricture of esophagus    Iron  deficiency anemia, unspecified 03/23/2016   History of tachycardia 07/29/2015   PCP: [name], NP  REFERRING PROVIDER: Lynwood Hue, MD  REFERRING DIAG: Rt knee pain  THERAPY DIAG:  Acute pain of right knee  Difficulty in walking, not elsewhere classified  Muscle weakness  (generalized)  Unsteadiness on feet  Rationale for Evaluation and Treatment: Rehabilitation  ONSET DATE: June 2025  SUBJECTIVE:  SUBJECTIVE STATEMENT: Pt reports a rough night, lots of pain in her back, thinks lying the plinth at exam is to blame. Pt was able to achieve her home exercises as given.    PERTINENT HISTORY: 88yoF referred to OPPT for acute insidious Rt medial knee pain. Pt seen by Dr. Hue [facility] orthopedics, per pt he recommended PT to gain muscle and strength in this knee, but generally the joint looked quite favorable. Unrelated, pt reports significant debility and difficulty walking without limiting DOE since being hospitalized last year- this has created more of activity restriction than her knee pain alone.   PAIN:  Are you having pain? No pain today; knee ache and significant back pain last night.   PRECAUTIONS: None  WEIGHT BEARING RESTRICTIONS: No  FALLS:  Has patient fallen in last 6 months? Yes. Number of falls 1  LIVING ENVIRONMENT: Lives with: lives with their family and lives with their son Stairs: No Has following equipment at home: Single point cane and Environmental consultant - 2 wheeled  OCCUPATION: retired   PLOF: drives, AMB with SPC more recently or walker last year when she left [facility], history of falls/neuropathy.   PATIENT GOALS: resolution of knee pain   OBJECTIVE:  Note: Objective measures were completed at evaluation unless otherwise noted.  DIAGNOSTIC FINDINGS:   Recently seen by orthopedics. PATIENT SURVEYS:  Attempted LEFS and KOOD, neither of which were compatible with pt's self-report capacity.  COGNITION: Overall cognitive status: globally WNL, mentation somewhat limiting to ability to response to LEFS/KOOS in a meaningful way.   EDEMA:  None seen upon visual inspection  PALPATION: Rt knee extensor mechanism; not tender, no focal or global edema/bogginess, healthy muscle mass JOINT RANGE:   ROM Right eval Left eval  Knee flexion >110*    Knee extension full full   *pt reports not limited, flexoin at baseline  FUNCTIONAL TESTS:  5xSTS: 20sec, hands free, no LOB, mild antalgia reps 4-5  445ft AMB  overground: SPC use, 8 minutes, 38 sec (11/16/23)   GAIT: Distance walked: 22 meters Assistive device utilized: SPC  Level of assistance: ModI  Comments: slow, appears to be limited more by baseline balance deficits  TODAY'S TREATMENT 11/16/23 :   -Nustep AA/ROM bilat legs Nustep, seat 5, legs only level 1x4 minutes (bad option, try something else next time)  -Overground AMB assessment: 423ft lap c SPC, quite slow, constant SOB, no LOB, 41m38sec *post AMB vitals: 145/52mmHg 65bpm, 98%SpO2  -STS from chair, hands free 1x8, 1x7 -SAQ 3x15 @ 2lb AW bilat -hooklying marching x20, alternating (2 sets)     PATIENT EDUCATION:  Education details: Since bridging is mildly aggravating to back pain, maintain exercise in low range at home as not to further exacerbate back issues. Person educated: Water quality scientist: collaborative learning, deliberate practice, positive reinforcement, explicit instruction, establish rules. Education comprehension: seems ok; FU in future to determine LT comprehension/adherence  HOME EXERCISE PROGRAM: Access Code: [email or web address] URL: [email or web address] Date: 11/14/2023 Prepared by: Peggye Linear  Exercises - Supine Knee Extension Strengthening  - 2 x daily - 1 sets - 15 reps - Supine Bridge  - 2 x daily - 1 sets - 10 reps (low range as not to aggravate chronic low back pain)  - Seated Long Arc Quad  - 2 x daily - 1 sets - 10 reps - Heel Raises with Counter Support  - 2 x daily - 1 sets - 15 reps  ASSESSMENT:  CLINICAL IMPRESSION: HEP assignments thus far successful. This session reveal of the greater degree on limitation from on going debility since hospital  admission last year, more limiting than her knee pain issue. In contrast to interventions here 2 years prior, pt remains far off her more recent baseline. Patient will benefit from skilled physical therapy intervention to reduce deficits and impairments identified in evaluation, in order to reduce pain, improve quality of life, and maximize activity tolerance for ADL, IADL, and leisure/fitness. Physical therapy will help pt achieve long and short term goals of care.     OBJECTIVE IMPAIRMENTS: Decreased knowledge of condition, decreased use of DME, decreased mobility, difficulty walking, decreased strength.  ACTIVITY LIMITATIONS: Lifting, standing, walking, squatting, transfers, locomotion level PARTICIPATION LIMITATIONS: Cleaning, laundry, interpersonal relationships, driving, yardwork, community activity.  PERSONAL FACTORS: Age, behavior pattern, education, past/current experiences, transportation, profession  are also affecting patient's functional outcome.  REHAB POTENTIAL: Good.  CLINICAL DECISION MAKING: moderate  EVALUATION COMPLEXITY: Stable/uncomplicated   GOALS: Goals reviewed with patient? No  SHORT TERM GOALS: Target date: 12/15/23 Patient will report comprehension, confidence, and consistent compliance and of a simple home exercise program established to facilitate symptoms management and basic strengthening and/or segment mobility.  Baseline: issued at visit 1 Goal status: INITIAL  2.  Pt to demonstrate improvement in transfers power AEB 5xSTS<17seconds. Baseline: 5XSTS 20sec on visit 1 Goal status: INITIAL  LONG TERM GOALS: Target date: 01/15/24 Pt to demonstrate ability to perform without pain limitation, less than 2/10 increase in NPRS, and with >10% improvement in distance to improve ability to participate in IADL and social events.   Baseline: pending assessment visit 2  Goal status: INITIAL  2.  Patient to demonstrate improved performance on initial transfers  assessment AEB improved reps per time or time per perform desired reps and/or reduced seat height and/or use of hands in order to improve safety, tolerance, and independence in ADL performance.   Baseline: visit 1 5xSTS: 20sec Goal status: INITIAL  3.  Pt to report ability to perform housework an dlighthousehold activities ad lib without painful restrictions to these activities.  Baseline: limited by pain at eval Goal status: INITIAL  PLAN:  PT FREQUENCY: 1-2x/week  PT DURATION: 8 weeks  PLANNED INTERVENTIONS: 97110-Therapeutic exercises, 97530- Therapeutic activity, [id number]- Neuromuscular re-education, [phone]- Self Care, 02859- Manual therapy, G0283- Electrical stimulation (unattended), [phone]- Electrical stimulation (manual), Patient/Family education, Balance training, Stair training, Joint mobilization, Joint manipulation, Visual/preceptual remediation/compensation, DME instructions, Cryotherapy, and Moist heat  PLAN FOR NEXT SESSION: continue with graded exercise and beging high intensity interval gait training with RW.   11:06 AM, 11/16/23 Peggye JAYSON Linear, PT, DPT Physical Therapist - [facility]  Outpatient Physical Therapy- Main [facility] [phone]     Dawan Farney C, PT 11/16/2023, 11:06 AM

## 2023-11-20 ENCOUNTER — Ambulatory Visit: Admitting: Physical Therapy

## 2023-11-20 DIAGNOSIS — R262 Difficulty in walking, not elsewhere classified: Secondary | ICD-10-CM | POA: Diagnosis not present

## 2023-11-20 DIAGNOSIS — R269 Unspecified abnormalities of gait and mobility: Secondary | ICD-10-CM | POA: Diagnosis not present

## 2023-11-20 DIAGNOSIS — M6281 Muscle weakness (generalized): Secondary | ICD-10-CM

## 2023-11-20 DIAGNOSIS — R2681 Unsteadiness on feet: Secondary | ICD-10-CM | POA: Diagnosis not present

## 2023-11-20 DIAGNOSIS — R278 Other lack of coordination: Secondary | ICD-10-CM | POA: Diagnosis not present

## 2023-11-20 DIAGNOSIS — M25561 Pain in right knee: Secondary | ICD-10-CM

## 2023-11-20 NOTE — Therapy (Signed)
 OUTPATIENT PHYSICAL THERAPY TREATMENT  Patient Name: Tracie Fisher MRN: [id number] DOB:1936/03/07, 88 y.o., female Today's Date: 11/20/2023  END OF SESSION:  PT End of Session - 11/20/23 1036     Visit Number 3    Number of Visits 16    Date for PT Re-Evaluation 01/09/24    Authorization Type [employer] Medicare    Authorization Time Period 11/14/23-01/09/24    Progress Note Due on Visit 10    PT Start Time 1102    PT Stop Time 1141    PT Time Calculation (min) 39 min    Equipment Utilized During Treatment Gait belt    Activity Tolerance Patient tolerated treatment well;No increased pain    Behavior During Therapy WFL for tasks assessed/performed           Past Medical History:  Diagnosis Date   Cancer (HCC)    SKIN   Complication of anesthesia    Depression    Dysrhythmia    GERD (gastroesophageal reflux disease)    Hiatal hernia    HOH (hard of hearing)    Hypertension    Hypothyroidism    PONV (postoperative nausea and vomiting)    Thyroid  disease    Past Surgical History:  Procedure Laterality Date   APPENDECTOMY     BACK SURGERY     MULTIPLE/ROD IN BACK   BREAST CYST ASPIRATION Left years ago   CATARACT EXTRACTION W/PHACO Left 03/21/2017   Procedure: CATARACT EXTRACTION PHACO AND INTRAOCULAR LENS PLACEMENT (IOC);  Surgeon: Jaye Fallow, MD;  Location: [facility];  Service: Ophthalmology;  Laterality: Left;  US  00:48 AP% 17.8 CDE 8.57 Fluid pack lot # [id number]   CATARACT EXTRACTION W/PHACO Right 04/11/2017   Procedure: CATARACT EXTRACTION PHACO AND INTRAOCULAR LENS PLACEMENT (IOC);  Surgeon: Jaye Fallow, MD;  Location: [facility];  Service: Ophthalmology;  Laterality: Right;  US  01:09.5 AP% 15.8 CDE 11.04 Fluid Pack lot # [id number]   CHOLECYSTECTOMY     COLON SURGERY     BOWEL OBSTRUCTION   COLONOSCOPY WITH PROPOFOL  N/A 04/25/2016   Procedure: COLONOSCOPY WITH PROPOFOL ;  Surgeon: Ruel Kung, MD;  Location: [facility];  Service: Endoscopy;   Laterality: N/A;   ESOPHAGOGASTRODUODENOSCOPY (EGD) WITH PROPOFOL  N/A 04/25/2016   Procedure: ESOPHAGOGASTRODUODENOSCOPY (EGD) WITH PROPOFOL ;  Surgeon: Ruel Kung, MD;  Location: [facility];  Service: Endoscopy;  Laterality: N/A;   EXPLORATORY LAPAROTOMY  10/18/2011   blockage small intestines   GIVENS CAPSULE STUDY N/A 05/11/2016   Procedure: GIVENS CAPSULE STUDY;  Surgeon: Ruel Kung, MD;  Location: [facility];  Service: Endoscopy;  Laterality: N/A;   Patient Active Problem List   Diagnosis Date Noted   Carotid stenosis 10/22/2023   Long-term current use of benzodiazepine 08/23/2023   Lymphedema 03/29/2023   Anemia due to stage 4 chronic kidney disease (HCC) 02/04/2023   Overweight (BMI 25.0-29.9) 08/05/2022   Vision loss 08/04/2022   Senile purpura (HCC) 06/16/2022   Insomnia 06/16/2022   Varicose veins of both lower extremities without ulcer or inflammation 06/16/2022   Mixed hyperlipidemia 06/12/2022   Occipital neuralgia 06/12/2022   Sensory ataxia 06/12/2022   Osteopenia of left forearm 06/12/2022   CMC arthritis 05/23/2022   CKD (chronic kidney disease) stage 4, GFR 15-29 ml/min (HCC) 03/03/2022   Hypothyroidism, adult 03/01/2022   Loss of memory 07/26/2021   Moderate pulmonary hypertension (HCC) 05/20/2021   Primary osteoarthritis involving multiple joints 03/24/2021   Unsteady gait 10/16/2019   History of stroke 03/04/2019   Bilateral foot pain 03/06/2018  Chronic bilateral low back pain 03/06/2018   Neck pain 03/06/2018   Neuropathy 02/06/2018   History of nonmelanoma skin cancer 09/27/2016   Essential hypertension 09/02/2016   Hiatal hernia    Stricture of esophagus    Iron  deficiency anemia, unspecified 03/23/2016   History of tachycardia 07/29/2015   PCP: [name], NP  REFERRING PROVIDER: Lynwood Hue, MD  REFERRING DIAG: Rt knee pain  THERAPY DIAG:  Acute pain of right knee  Difficulty in walking, not elsewhere classified  Muscle  weakness (generalized)  Unsteadiness on feet  Abnormality of gait and mobility  Rationale for Evaluation and Treatment: Rehabilitation  ONSET DATE: June 2025  SUBJECTIVE:  SUBJECTIVE STATEMENT: Pt reports doing well since last visit. Presents to PT with SPC.   PERTINENT HISTORY: 88yoF referred to OPPT for acute insidious Rt medial knee pain. Pt seen by Dr. Hue [facility] orthopedics, per pt he recommended PT to gain muscle and strength in this knee, but generally the joint looked quite favorable. Unrelated, pt reports significant debility and difficulty walking without limiting DOE since being hospitalized last year- this has created more of activity restriction than her knee pain alone.   PAIN:  Are you having pain? No pain today; knee ache and significant back pain last night.   PRECAUTIONS: None  WEIGHT BEARING RESTRICTIONS: No  FALLS:  Has patient fallen in last 6 months? Yes. Number of falls 1  LIVING ENVIRONMENT: Lives with: lives with their family and lives with their son Stairs: No Has following equipment at home: Single point cane and Environmental consultant - 2 wheeled  OCCUPATION: retired   PLOF: drives, AMB with SPC more recently or walker last year when she left [facility], history of falls/neuropathy.   PATIENT GOALS: resolution of knee pain   OBJECTIVE:  Note: Objective measures were completed at evaluation unless otherwise noted.  DIAGNOSTIC FINDINGS:   Recently seen by orthopedics. PATIENT SURVEYS:  Attempted LEFS and KOOD, neither of which were compatible with pt's self-report capacity.  COGNITION: Overall cognitive status: globally WNL, mentation somewhat limiting to ability to response to LEFS/KOOS in a meaningful way.   EDEMA:  None seen upon visual inspection  PALPATION: Rt knee extensor mechanism; not tender, no focal or global edema/bogginess, healthy muscle mass JOINT RANGE:   ROM Right eval Left eval  Knee flexion >110*   Knee extension full full   *pt reports  not limited, flexoin at baseline  FUNCTIONAL TESTS:  5xSTS: 20sec, hands free, no LOB, mild antalgia reps 4-5  464ft AMB  overground: SPC use, 8 minutes, 38 sec (11/16/23)   GAIT: Distance walked: 22 meters Assistive device utilized: SPC  Level of assistance: ModI  Comments: slow, appears to be limited more by baseline balance deficits                                                                                                                              TODAY'S TREATMENT 11/20/23 :  Physical Performance:  6 Min Walk Test:  Instructed patient to ambulate as quickly and as safely as possible for 6 minutes using LRAD. Patient was allowed to take standing rest breaks without stopping the test, but if the patient required a sitting rest break the clock would be stopped and the test would be over.  Results: 440 feet using a SPC with CGA. Results indicate that the patient has reduced endurance with ambulation compared to age matched norms.  Age Matched Norms (in meters): 26-69 yo M: 57 F: 76, 35-79 yo M: 12 F: 471, 58-89 yo M: 417 F: 392 MDC: 58.21 meters (190.98 feet) or 50 meters (ANPTA Core Set of Outcome Measures for Adults with Neurologic Conditions, 2018)  -fatigue noted in hips, cane use every 4 steps, slow gait speed. Finishes at 440 ft.   Seated rest  6 Min Walk Test:  Instructed patient to ambulate as quickly and as safely as possible for 6 minutes using LRAD. Patient was allowed to take standing rest breaks without stopping the test, but if the patient required a sitting rest break the clock would be stopped and the test would be over.  Results: 550 feet using a 4WW with SBA. Results indicate that the patient has reduced endurance with ambulation compared to age matched norms.  Age Matched Norms (in meters): 29-69 yo M: 21 F: 59, 55-79 yo M: 78 F: 471, 13-89 yo M: 417 F: 392 MDC: 58.21 meters (190.98 feet) or 50 meters (ANPTA Core Set of Outcome Measures for Adults with  Neurologic Conditions, 2018)  - less hip discomfort ( a little noted at end), consistent pace throughout and less need for guarding. Encouraged use of walker for community ambulation   TE- To improve strength, endurance, mobility, and function of specific targeted muscle groups or improve joint range of motion or improve muscle flexibility  Standing heel raise 3 x 10, cues for form to prevent ant weight shift to accomplish movement   Seated LAQ with 3# AW 2 x 10 ea LE   Standing step taps with 3# AW x 15 ea side   Standing hip ABD x 10 ea side with 3# AW      PATIENT EDUCATION:  Education details: Pt educated throughout session about proper posture and technique with exercises. Improved exercise technique, movement at target joints, use of target muscles after min to mod verbal, visual, tactile cues. Person educated: Pt  Education method: collaborative learning, deliberate practice, positive reinforcement, explicit instruction, establish rules. Education comprehension: seems ok; FU in future to determine LT comprehension/adherence  HOME EXERCISE PROGRAM: Access Code: [email or web address] URL: [email or web address] Date: 11/14/2023 Prepared by: Peggye Linear  Exercises - Supine Knee Extension Strengthening  - 2 x daily - 1 sets - 15 reps - Supine Bridge  - 2 x daily - 1 sets - 10 reps (low range as not to aggravate chronic low back pain)  - Seated Long Arc Quad  - 2 x daily - 1 sets - 10 reps - Heel Raises with Counter Support  - 2 x daily - 1 sets - 15 reps  ASSESSMENT:  CLINICAL IMPRESSION: Patient arrived with good motivation for completion of pt activities.  Pt completed both with SPC and with rollator, both well below age matched norms indicating increased fall risk and impaired community mobility. Pt encouraged to continue with HEP but not to perform if they are inducing pain. Pt reports no pain with exercises but does have fatigue in hip musculature. Pt Fisher  continue to benefit from skilled physical therapy intervention to address impairments, improve QOL, and attain therapy goals.      OBJECTIVE IMPAIRMENTS: Decreased knowledge of condition, decreased use of DME, decreased mobility, difficulty walking, decreased strength.  ACTIVITY LIMITATIONS: Lifting, standing, walking, squatting, transfers, locomotion level PARTICIPATION LIMITATIONS: Cleaning, laundry, interpersonal relationships, driving, yardwork, community activity.  PERSONAL FACTORS: Age, behavior pattern, education, past/current experiences, transportation, profession  are also affecting patient's functional outcome.  REHAB POTENTIAL: Good.  CLINICAL DECISION MAKING: moderate  EVALUATION COMPLEXITY: Stable/uncomplicated   GOALS: Goals reviewed with patient? No  SHORT TERM GOALS: Target date: 12/15/23 Patient Fisher report comprehension, confidence, and consistent compliance and of a simple home exercise program established to facilitate symptoms management and basic strengthening and/or segment mobility.  Baseline: issued at visit 1 Goal status: INITIAL  2.  Pt to demonstrate improvement in transfers power AEB 5xSTS<17seconds. Baseline: 5XSTS 20sec on visit 1 Goal status: INITIAL  LONG TERM GOALS: Target date: 01/15/24 Pt to demonstrate ability to perform without pain limitation, less than 2/10 increase in NPRS, and with >10% improvement in distance to improve ability to participate in IADL and social events.   Baseline: pending assessment visit 2  Goal status: INITIAL  2.  Patient to demonstrate improved performance on initial transfers assessment AEB improved reps per time or time per perform desired reps and/or reduced seat height and/or use of hands in order to improve safety, tolerance, and independence in ADL performance.   Baseline: visit 1 5xSTS: 20sec Goal status: INITIAL  3.  Pt to report ability to perform housework an dlighthousehold activities ad lib without  painful restrictions to these activities.  Baseline: limited by pain at eval Goal status: INITIAL  PLAN:  PT FREQUENCY: 1-2x/week  PT DURATION: 8 weeks  PLANNED INTERVENTIONS: 97110-Therapeutic exercises, 97530- Therapeutic activity, 97112- Neuromuscular re-education, 97535- Self Care, 02859- Manual therapy, G0283- Electrical stimulation (unattended), [phone]- Electrical stimulation (manual), Patient/Family education, Balance training, Stair training, Joint mobilization, Joint manipulation, Visual/preceptual remediation/compensation, DME instructions, Cryotherapy, and Moist heat  PLAN FOR NEXT SESSION: continue with graded exercise and progress high intensity interval gait training with 4WW.   10:47 AM, 11/20/23   Lonni KATHEE Gainer, PT 11/20/2023, 10:47 AM

## 2023-11-21 NOTE — Therapy (Signed)
 OUTPATIENT PHYSICAL THERAPY TREATMENT  Patient Name: Tracie Fisher MRN: [id number] DOB:Jan 21, 1936, 88 y.o., female Today's Date: 11/22/2023  END OF SESSION:  PT End of Session - 11/22/23 1145     Visit Number 4    Number of Visits 16    Date for PT Re-Evaluation 01/09/24    Authorization Type [employer] Medicare    Authorization Time Period 11/14/23-01/09/24    Progress Note Due on Visit 10    PT Start Time 1145    PT Stop Time 1229    PT Time Calculation (min) 44 min    Equipment Utilized During Treatment Gait belt    Activity Tolerance Patient tolerated treatment well;No increased pain    Behavior During Therapy WFL for tasks assessed/performed            Past Medical History:  Diagnosis Date   Cancer (HCC)    SKIN   Complication of anesthesia    Depression    Dysrhythmia    GERD (gastroesophageal reflux disease)    Hiatal hernia    HOH (hard of hearing)    Hypertension    Hypothyroidism    PONV (postoperative nausea and vomiting)    Thyroid  disease    Past Surgical History:  Procedure Laterality Date   APPENDECTOMY     BACK SURGERY     MULTIPLE/ROD IN BACK   BREAST CYST ASPIRATION Left years ago   CATARACT EXTRACTION W/PHACO Left 03/21/2017   Procedure: CATARACT EXTRACTION PHACO AND INTRAOCULAR LENS PLACEMENT (IOC);  Surgeon: Jaye Fallow, MD;  Location: [facility];  Service: Ophthalmology;  Laterality: Left;  US  00:48 AP% 17.8 CDE 8.57 Fluid pack lot # [id number]   CATARACT EXTRACTION W/PHACO Right 04/11/2017   Procedure: CATARACT EXTRACTION PHACO AND INTRAOCULAR LENS PLACEMENT (IOC);  Surgeon: Jaye Fallow, MD;  Location: [facility];  Service: Ophthalmology;  Laterality: Right;  US  01:09.5 AP% 15.8 CDE 11.04 Fluid Pack lot # [id number]   CHOLECYSTECTOMY     COLON SURGERY     BOWEL OBSTRUCTION   COLONOSCOPY WITH PROPOFOL  N/A 04/25/2016   Procedure: COLONOSCOPY WITH PROPOFOL ;  Surgeon: Ruel Kung, MD;  Location: [facility];  Service: Endoscopy;   Laterality: N/A;   ESOPHAGOGASTRODUODENOSCOPY (EGD) WITH PROPOFOL  N/A 04/25/2016   Procedure: ESOPHAGOGASTRODUODENOSCOPY (EGD) WITH PROPOFOL ;  Surgeon: Ruel Kung, MD;  Location: [facility];  Service: Endoscopy;  Laterality: N/A;   EXPLORATORY LAPAROTOMY  10/18/2011   blockage small intestines   GIVENS CAPSULE STUDY N/A 05/11/2016   Procedure: GIVENS CAPSULE STUDY;  Surgeon: Ruel Kung, MD;  Location: [facility];  Service: Endoscopy;  Laterality: N/A;   Patient Active Problem List   Diagnosis Date Noted   Carotid stenosis 10/22/2023   Long-term current use of benzodiazepine 08/23/2023   Lymphedema 03/29/2023   Anemia due to stage 4 chronic kidney disease (HCC) 02/04/2023   Overweight (BMI 25.0-29.9) 08/05/2022   Vision loss 08/04/2022   Senile purpura (HCC) 06/16/2022   Insomnia 06/16/2022   Varicose veins of both lower extremities without ulcer or inflammation 06/16/2022   Mixed hyperlipidemia 06/12/2022   Occipital neuralgia 06/12/2022   Sensory ataxia 06/12/2022   Osteopenia of left forearm 06/12/2022   CMC arthritis 05/23/2022   CKD (chronic kidney disease) stage 4, GFR 15-29 ml/min (HCC) 03/03/2022   Hypothyroidism, adult 03/01/2022   Loss of memory 07/26/2021   Moderate pulmonary hypertension (HCC) 05/20/2021   Primary osteoarthritis involving multiple joints 03/24/2021   Unsteady gait 10/16/2019   History of stroke 03/04/2019   Bilateral foot pain  03/06/2018   Chronic bilateral low back pain 03/06/2018   Neck pain 03/06/2018   Neuropathy 02/06/2018   History of nonmelanoma skin cancer 09/27/2016   Essential hypertension 09/02/2016   Hiatal hernia    Stricture of esophagus    Iron  deficiency anemia, unspecified 03/23/2016   History of tachycardia 07/29/2015   PCP: [name], NP  REFERRING PROVIDER: Lynwood Hue, MD  REFERRING DIAG: Rt knee pain  THERAPY DIAG:  Acute pain of right knee  Difficulty in walking, not elsewhere classified  Muscle  weakness (generalized)  Unsteadiness on feet  Abnormality of gait and mobility  Rationale for Evaluation and Treatment: Rehabilitation  ONSET DATE: June 2025  SUBJECTIVE:  SUBJECTIVE STATEMENT: Patient reports some aching in her knees but no pain.   PERTINENT HISTORY: 88yoF referred to OPPT for acute insidious Rt medial knee pain. Pt seen by Dr. Hue [facility], per pt he recommended PT to gain muscle and strength in this knee, but generally the joint looked quite favorable. Unrelated, pt reports significant debility and difficulty walking without limiting DOE since being hospitalized last year- this has created more of activity restriction than her knee pain alone.   PAIN:  Are you having pain? No pain today; knee ache and significant back pain last night.   PRECAUTIONS: None  WEIGHT BEARING RESTRICTIONS: No  FALLS:  Has patient fallen in last 6 months? Yes. Number of falls 1  LIVING ENVIRONMENT: Lives with: lives with their family and lives with their son Stairs: No Has following equipment at home: Single point cane and Environmental consultant - 2 wheeled  OCCUPATION: retired   PLOF: drives, AMB with SPC more recently or walker last year when she left [employer], history of falls/neuropathy.   PATIENT GOALS: resolution of knee pain   OBJECTIVE:  Note: Objective measures were completed at evaluation unless otherwise noted.  DIAGNOSTIC FINDINGS:   Recently seen by orthopedics. PATIENT SURVEYS:  Attempted LEFS and KOOD, neither of which were compatible with pt's self-report capacity.  COGNITION: Overall cognitive status: globally WNL, mentation somewhat limiting to ability to response to LEFS/KOOS in a meaningful way.   EDEMA:  None seen upon visual inspection  PALPATION: Rt knee extensor mechanism; not tender, no focal or global edema/bogginess, healthy muscle mass JOINT RANGE:   ROM Right eval Left eval  Knee flexion >110*   Knee extension full full   *pt reports not limited,  flexoin at baseline  FUNCTIONAL TESTS:  5xSTS: 20sec, hands free, no LOB, mild antalgia reps 4-5  422ft AMB  overground: SPC use, 8 minutes, 38 sec (11/16/23)   GAIT: Distance walked: 22 meters Assistive device utilized: SPC  Level of assistance: ModI  Comments: slow, appears to be limited more by baseline balance deficits                                                                                                                              TODAY'S TREATMENT 11/22/23 :  TE- To improve strength, endurance, mobility, and function of specific targeted muscle groups or improve joint range of motion or improve muscle flexibility    Seated  LAQ with 3# AW 2 x 10 ea LE  March with #3 AW 10x; 2 sets  Adduction squeeze 12x Adduction with ER/IR 10x each side   Supine: Bridge 10x; 2 sets Knee abduction GTB 10x ; 2 sets  Standing: Sit to stand 10x  Standing heel raise 15, cues for form to prevent ant weight shift to accomplish movement  Standing step taps with 3# AW x 15 ea side  #3 ankle weight lateral toe taps 15x each side   PATIENT EDUCATION:  Education details: Pt educated throughout session about proper posture and technique with exercises. Improved exercise technique, movement at target joints, use of target muscles after min to mod verbal, visual, tactile cues. Person educated: Pt  Education method: collaborative learning, deliberate practice, positive reinforcement, explicit instruction, establish rules. Education comprehension: seems ok; FU in future to determine LT comprehension/adherence  HOME EXERCISE PROGRAM: Access Code: [email or web address] URL: [email or web address] Date: 11/14/2023 Prepared by: Peggye Linear  Exercises - Supine Knee Extension Strengthening  - 2 x daily - 1 sets - 15 reps - Supine Bridge  - 2 x daily - 1 sets - 10 reps (low range as not to aggravate chronic low back pain)  - Seated Long Arc Quad  - 2 x daily - 1 sets - 10 reps - Heel  Raises with Counter Support  - 2 x daily - 1 sets - 15 reps  ASSESSMENT:  CLINICAL IMPRESSION: Patient speaks very softly making it occasionally challenging to hear her. She is highly motivated throughout session and eager to progress her pain free mobility. She does have shortness of breath with SP02 monitored and retaining about 95%.  Pt will continue to benefit from skilled physical therapy intervention to address impairments, improve QOL, and attain therapy goals.      OBJECTIVE IMPAIRMENTS: Decreased knowledge of condition, decreased use of DME, decreased mobility, difficulty walking, decreased strength.  ACTIVITY LIMITATIONS: Lifting, standing, walking, squatting, transfers, locomotion level PARTICIPATION LIMITATIONS: Cleaning, laundry, interpersonal relationships, driving, yardwork, community activity.  PERSONAL FACTORS: Age, behavior pattern, education, past/current experiences, transportation, profession  are also affecting patient's functional outcome.  REHAB POTENTIAL: Good.  CLINICAL DECISION MAKING: moderate  EVALUATION COMPLEXITY: Stable/uncomplicated   GOALS: Goals reviewed with patient? No  SHORT TERM GOALS: Target date: 12/15/23 Patient will report comprehension, confidence, and consistent compliance and of a simple home exercise program established to facilitate symptoms management and basic strengthening and/or segment mobility.  Baseline: issued at visit 1 Goal status: INITIAL  2.  Pt to demonstrate improvement in transfers power AEB 5xSTS<17seconds. Baseline: 5XSTS 20sec on visit 1 Goal status: INITIAL  LONG TERM GOALS: Target date: 01/15/24 Pt to demonstrate ability to perform without pain limitation, less than 2/10 increase in NPRS, and with >10% improvement in distance to improve ability to participate in IADL and social events.   Baseline: pending assessment visit 2  Goal status: INITIAL  2.  Patient to demonstrate improved performance on initial  transfers assessment AEB improved reps per time or time per perform desired reps and/or reduced seat height and/or use of hands in order to improve safety, tolerance, and independence in ADL performance.   Baseline: visit 1 5xSTS: 20sec Goal status: INITIAL  3.  Pt to report ability to perform housework an dlighthousehold activities ad lib without painful restrictions to these activities.  Baseline:  limited by pain at eval Goal status: INITIAL  PLAN:  PT FREQUENCY: 1-2x/week  PT DURATION: 8 weeks  PLANNED INTERVENTIONS: 97110-Therapeutic exercises, 97530- Therapeutic activity, 97112- Neuromuscular re-education, 97535- Self Care, [id number]- Manual therapy, [id number]- Electrical stimulation (unattended), [phone]- Electrical stimulation (manual), Patient/Family education, Balance training, Stair training, Joint mobilization, Joint manipulation, Visual/preceptual remediation/compensation, DME instructions, Cryotherapy, and Moist heat  PLAN FOR NEXT SESSION: continue with graded exercise and progress high intensity interval gait training with 4WW.   12:43 PM, 11/22/23   Aksh Swart, PT 11/22/2023, 12:43 PM

## 2023-11-22 ENCOUNTER — Other Ambulatory Visit: Payer: Self-pay

## 2023-11-22 ENCOUNTER — Ambulatory Visit

## 2023-11-22 ENCOUNTER — Ambulatory Visit: Admitting: Nurse Practitioner

## 2023-11-22 DIAGNOSIS — M25561 Pain in right knee: Secondary | ICD-10-CM | POA: Diagnosis not present

## 2023-11-22 DIAGNOSIS — R262 Difficulty in walking, not elsewhere classified: Secondary | ICD-10-CM | POA: Diagnosis not present

## 2023-11-22 DIAGNOSIS — M6281 Muscle weakness (generalized): Secondary | ICD-10-CM | POA: Diagnosis not present

## 2023-11-22 DIAGNOSIS — R2681 Unsteadiness on feet: Secondary | ICD-10-CM

## 2023-11-22 DIAGNOSIS — R269 Unspecified abnormalities of gait and mobility: Secondary | ICD-10-CM | POA: Diagnosis not present

## 2023-11-22 DIAGNOSIS — R278 Other lack of coordination: Secondary | ICD-10-CM | POA: Diagnosis not present

## 2023-11-22 MED ORDER — BUTALBITAL-APAP-CAFFEINE 50-325-40 MG PO TABS: 1.0000 | ORAL_TABLET | Freq: Four times a day (QID) | ORAL | 0 refills | Status: DC | PRN

## 2023-11-23 ENCOUNTER — Other Ambulatory Visit: Payer: Self-pay | Admitting: Nurse Practitioner

## 2023-11-23 NOTE — Telephone Encounter (Signed)
 Copied from CRM [id number]. Topic: Clinical - Medication Refill >> Nov 23, 2023  8:57 AM Carlatta H wrote: Medication: butalbital -acetaminophen -caffeine  (FIORICET ) 50-325-40 MG tablet  Has the patient contacted their pharmacy? Yes (Agent: If no, request that the patient contact the pharmacy for the refill. If patient does not wish to contact the pharmacy document the reason why and proceed with request.) (Agent: If yes, when and what did the pharmacy advise?)Advised to contact doctor  This is the patient's preferred pharmacy:  [facility] - [place], [place] - [address] [place] [address] Phone: [phone] Fax: [phone]  Is this the correct pharmacy for this prescription? Yes If no, delete pharmacy and type the correct one.   Has the prescription been filled recently? No  Is the patient out of the medication? Yes  Has the patient been seen for an appointment in the last year OR does the patient have an upcoming appointment? No  Can we respond through MyChart? No  Agent: Please be advised that Rx refills may take up to 3 business days. We ask that you follow-up with your pharmacy.

## 2023-11-24 NOTE — Telephone Encounter (Signed)
 Requested medication (s) are due for refill today: no  Requested medication (s) are on the active medication list: yes  Last refill:  11/22/23 #20  Future visit scheduled: no  Notes to clinic:  med not delegated to NT to RF   Requested Prescriptions  Pending Prescriptions Disp Refills   butalbital -acetaminophen -caffeine  (FIORICET ) 50-325-40 MG tablet 20 tablet 0    Sig: Take 1-2 tablets by mouth every 6 (six) hours as needed for headache.     Not Delegated - Analgesics:  Non-Opioid Analgesic Combinations 2 Failed - 11/24/2023  3:19 PM      Failed - This refill cannot be delegated      Failed - Cr in normal range and within 360 days    Creatinine  Date Value Ref Range Status  02/13/2013 1.09 0.60 - 1.30 mg/dL Final   Creatinine, Ser  Date Value Ref Range Status  02/03/2023 1.20 (H) 0.57 - 1.00 mg/dL Final         Passed - eGFR is 10 or above and within 360 days    EGFR (African American)  Date Value Ref Range Status  02/13/2013 58 (L)  Final   GFR calc Af Amer  Date Value Ref Range Status  10/13/2019 52 (L) >60 mL/min Final   EGFR (Non-African Amer.)  Date Value Ref Range Status  02/13/2013 50 (L)  Final    Comment:    eGFR values <55mL/min/1.73 m2 may be an indication of chronic kidney disease (CKD). Calculated eGFR is useful in patients with stable renal function. The eGFR calculation will not be reliable in acutely ill patients when serum creatinine is changing rapidly. It is not useful in  patients on dialysis. The eGFR calculation may not be applicable to patients at the low and high extremes of body sizes, pregnant women, and vegetarians.    GFR, Estimated  Date Value Ref Range Status  12/04/2022 49 (L) >60 mL/min Final    Comment:    (NOTE) Calculated using the CKD-EPI Creatinine Equation (2021)    eGFR  Date Value Ref Range Status  02/03/2023 44 (L) >59 mL/min/1.73 Final         Passed - Patient is not pregnant      Passed - Valid encounter  within last 12 months    Recent Outpatient Visits           1 week ago Essential hypertension   [facility] [place], Melanie T, NP   2 weeks ago Laryngitis   [place] [facility] Herold Hadassah SQUIBB, MD   1 month ago CKD (chronic kidney disease) stage 4, GFR 15-29 ml/min (HCC)   [facility] [place], [place] T, NP   3 months ago Moderate pulmonary hypertension (HCC)   [place] [facility] [place], Melanie DASEN, NP

## 2023-11-27 ENCOUNTER — Ambulatory Visit: Admitting: Physical Therapy

## 2023-11-27 ENCOUNTER — Encounter: Admitting: Physical Therapy

## 2023-11-27 DIAGNOSIS — R262 Difficulty in walking, not elsewhere classified: Secondary | ICD-10-CM | POA: Diagnosis not present

## 2023-11-27 DIAGNOSIS — R269 Unspecified abnormalities of gait and mobility: Secondary | ICD-10-CM | POA: Diagnosis not present

## 2023-11-27 DIAGNOSIS — R2681 Unsteadiness on feet: Secondary | ICD-10-CM | POA: Diagnosis not present

## 2023-11-27 DIAGNOSIS — M25561 Pain in right knee: Secondary | ICD-10-CM | POA: Diagnosis not present

## 2023-11-27 DIAGNOSIS — R278 Other lack of coordination: Secondary | ICD-10-CM | POA: Diagnosis not present

## 2023-11-27 DIAGNOSIS — M6281 Muscle weakness (generalized): Secondary | ICD-10-CM | POA: Diagnosis not present

## 2023-11-27 NOTE — Therapy (Signed)
 OUTPATIENT PHYSICAL THERAPY TREATMENT  Patient Name: Tracie Fisher MRN: [id number] DOB:September 11, 1935, 88 y.o., female Today's Date: 11/27/2023  END OF SESSION:  PT End of Session - 11/27/23 1001     Visit Number 5    Number of Visits 16    Date for PT Re-Evaluation 01/09/24    Authorization Type [employer]    Authorization Time Period 11/14/23-01/09/24    Progress Note Due on Visit 10    PT Start Time 1015    PT Stop Time 1055    PT Time Calculation (min) 40 min    Equipment Utilized During Treatment Gait belt    Activity Tolerance Patient tolerated treatment well;No increased pain    Behavior During Therapy WFL for tasks assessed/performed            Past Medical History:  Diagnosis Date   Cancer (HCC)    SKIN   Complication of anesthesia    Depression    Dysrhythmia    GERD (gastroesophageal reflux disease)    Hiatal hernia    HOH (hard of hearing)    Hypertension    Hypothyroidism    PONV (postoperative nausea and vomiting)    Thyroid  disease    Past Surgical History:  Procedure Laterality Date   APPENDECTOMY     BACK SURGERY     MULTIPLE/ROD IN BACK   BREAST CYST ASPIRATION Left years ago   CATARACT EXTRACTION W/PHACO Left 03/21/2017   Procedure: CATARACT EXTRACTION PHACO AND INTRAOCULAR LENS PLACEMENT (IOC);  Surgeon: Jaye Fallow, MD;  Location: [facility];  Service: Ophthalmology;  Laterality: Left;  US  00:48 AP% 17.8 CDE 8.57 Fluid pack lot # [id number]   CATARACT EXTRACTION W/PHACO Right 04/11/2017   Procedure: CATARACT EXTRACTION PHACO AND INTRAOCULAR LENS PLACEMENT (IOC);  Surgeon: Jaye Fallow, MD;  Location: [facility];  Service: Ophthalmology;  Laterality: Right;  US  01:09.5 AP% 15.8 CDE 11.04 Fluid Pack lot # [id number]   CHOLECYSTECTOMY     COLON SURGERY     BOWEL OBSTRUCTION   COLONOSCOPY WITH PROPOFOL  N/A 04/25/2016   Procedure: COLONOSCOPY WITH PROPOFOL ;  Surgeon: Ruel Kung, MD;  Location: [facility];  Service: Endoscopy;   Laterality: N/A;   ESOPHAGOGASTRODUODENOSCOPY (EGD) WITH PROPOFOL  N/A 04/25/2016   Procedure: ESOPHAGOGASTRODUODENOSCOPY (EGD) WITH PROPOFOL ;  Surgeon: Ruel Kung, MD;  Location: [facility];  Service: Endoscopy;  Laterality: N/A;   EXPLORATORY LAPAROTOMY  10/18/2011   blockage small intestines   GIVENS CAPSULE STUDY N/A 05/11/2016   Procedure: GIVENS CAPSULE STUDY;  Surgeon: Ruel Kung, MD;  Location: [facility];  Service: Endoscopy;  Laterality: N/A;   Patient Active Problem List   Diagnosis Date Noted   Carotid stenosis 10/22/2023   Long-term current use of benzodiazepine 08/23/2023   Lymphedema 03/29/2023   Anemia due to stage 4 chronic kidney disease (HCC) 02/04/2023   Overweight (BMI 25.0-29.9) 08/05/2022   Vision loss 08/04/2022   Senile purpura (HCC) 06/16/2022   Insomnia 06/16/2022   Varicose veins of both lower extremities without ulcer or inflammation 06/16/2022   Mixed hyperlipidemia 06/12/2022   Occipital neuralgia 06/12/2022   Sensory ataxia 06/12/2022   Osteopenia of left forearm 06/12/2022   CMC arthritis 05/23/2022   CKD (chronic kidney disease) stage 4, GFR 15-29 ml/min (HCC) 03/03/2022   Hypothyroidism, adult 03/01/2022   Loss of memory 07/26/2021   Moderate pulmonary hypertension (HCC) 05/20/2021   Primary osteoarthritis involving multiple joints 03/24/2021   Unsteady gait 10/16/2019   History of stroke 03/04/2019   Bilateral foot pain  03/06/2018   Chronic bilateral low back pain 03/06/2018   Neck pain 03/06/2018   Neuropathy 02/06/2018   History of nonmelanoma skin cancer 09/27/2016   Essential hypertension 09/02/2016   Hiatal hernia    Stricture of esophagus    Iron  deficiency anemia, unspecified 03/23/2016   History of tachycardia 07/29/2015   PCP: [name], NP  REFERRING PROVIDER: Lynwood Hue, MD  REFERRING DIAG: Rt knee pain  THERAPY DIAG:  Acute pain of right knee  Difficulty in walking, not elsewhere classified  Muscle  weakness (generalized)  Unsteadiness on feet  Abnormality of gait and mobility  Rationale for Evaluation and Treatment: Rehabilitation  ONSET DATE: June 2025  SUBJECTIVE:  SUBJECTIVE STATEMENT: Pt reports that she is feeling a little better today, but states that she has been waking up with a HA on the R side of the head every morning.   Reports that HA is only bothering her a little bit at start of PT treatment:   Still reports soreness in bil knees but no pain.   PERTINENT HISTORY: 88yoF referred to OPPT for acute insidious Rt medial knee pain. Pt seen by Dr. Hue [facility], per pt he recommended PT to gain muscle and strength in this knee, but generally the joint looked quite favorable. Unrelated, pt reports significant debility and difficulty walking without limiting DOE since being hospitalized last year- this has created more of activity restriction than her knee pain alone.   PAIN:  Are you having pain? No pain today; knee ache and significant back pain last night.   PRECAUTIONS: None  WEIGHT BEARING RESTRICTIONS: No  FALLS:  Has patient fallen in last 6 months? Yes. Number of falls 1  LIVING ENVIRONMENT: Lives with: lives with their family and lives with their son Stairs: No Has following equipment at home: Single point cane and Environmental consultant - 2 wheeled  OCCUPATION: retired   PLOF: drives, AMB with SPC more recently or walker last year when she left [employer], history of falls/neuropathy.   PATIENT GOALS: resolution of knee pain   OBJECTIVE:  Note: Objective measures were completed at evaluation unless otherwise noted.  DIAGNOSTIC FINDINGS:   Recently seen by orthopedics. PATIENT SURVEYS:  Attempted LEFS and KOOD, neither of which were compatible with pt's self-report capacity.  COGNITION: Overall cognitive status: globally WNL, mentation somewhat limiting to ability to response to LEFS/KOOS in a meaningful way.   EDEMA:  None seen upon visual inspection   PALPATION: Rt knee extensor mechanism; not tender, no focal or global edema/bogginess, healthy muscle mass JOINT RANGE:   ROM Right eval Left eval  Knee flexion >110*   Knee extension full full   *pt reports not limited, flexoin at baseline  FUNCTIONAL TESTS:  5xSTS: 20sec, hands free, no LOB, mild antalgia reps 4-5  428ft AMB  overground: SPC use, 8 minutes, 38 sec (11/16/23)   GAIT: Distance walked: 22 meters Assistive device utilized: SPC  Level of assistance: ModI  Comments: slow, appears to be limited more by baseline balance deficits  TODAY'S TREATMENT 11/27/23 :    Pt reports that she is feeling a little better today, but states that she has been waking up with a HA on the R side of the head every morning.   Reports that HA is only bothering her a little bit at start of PT treatment:   Self Care:  BP assessed by PT 172/62 HR 58 with regular BP cuff 149/73 HR 57 with small cuff.    TE- To improve strength, endurance, mobility, and function of specific targeted muscle groups or improve joint range of motion or improve muscle flexibility  Nustep AAROM, reciprocal movement, and activity tolerance training 2 x 2.5 min level 1-2 therapeutic rest break between bouts.    Seated  LAQ with 3# AW 2 x 11 ea LE  March with #3 AW 12x; 2 sets  HS curl with RTB 2 x 12  Adduction squeeze 12x Sit<>stand without UE support 3 x 5.   BP assessed following seated therex: 166/78 HR 67  PT remained attentive to pt needs and provided multiple therapeutic rest breaks as needed between sets due to reported BLE fatigue.   PATIENT EDUCATION:  Education details: Pt educated throughout session about proper posture and technique with exercises. Improved exercise technique, movement at target joints, use of target muscles after min to mod verbal, visual, tactile  cues. Person educated: Pt  Education method: collaborative learning, deliberate practice, positive reinforcement, explicit instruction, establish rules. Education comprehension: seems ok; FU in future to determine LT comprehension/adherence   Benefits of ergonomic handle on cane for hand pain management at metacarpals.   HOME EXERCISE PROGRAM: Access Code: [email or web address] URL: [email or web address] Date: 11/14/2023 Prepared by: Peggye Linear  Exercises - Supine Knee Extension Strengthening  - 2 x daily - 1 sets - 15 reps - Supine Bridge  - 2 x daily - 1 sets - 10 reps (low range as not to aggravate chronic low back pain)  - Seated Long Arc Quad  - 2 x daily - 1 sets - 10 reps - Heel Raises with Counter Support  - 2 x daily - 1 sets - 15 reps  ASSESSMENT:  CLINICAL IMPRESSION: Patient speaks very softly making it occasionally challenging to hear her. She is highly motivated throughout session and eager to progress her pain free mobility. PT increased repetitions for each therex and added cardiovascular component. VS assessed due to HA over the last few days. Mildly elevated on this day, but pt reports that this is normal for her and no additional s/s. Will continue to monitor.  Pt will continue to benefit from skilled physical therapy intervention to address impairments, improve QOL, and attain therapy goals.      OBJECTIVE IMPAIRMENTS: Decreased knowledge of condition, decreased use of DME, decreased mobility, difficulty walking, decreased strength.  ACTIVITY LIMITATIONS: Lifting, standing, walking, squatting, transfers, locomotion level PARTICIPATION LIMITATIONS: Cleaning, laundry, interpersonal relationships, driving, yardwork, community activity.  PERSONAL FACTORS: Age, behavior pattern, education, past/current experiences, transportation, profession  are also affecting patient's functional outcome.  REHAB POTENTIAL: Good.  CLINICAL DECISION MAKING: moderate  EVALUATION  COMPLEXITY: Stable/uncomplicated   GOALS: Goals reviewed with patient? No  SHORT TERM GOALS: Target date: 12/15/23 Patient will report comprehension, confidence, and consistent compliance and of a simple home exercise program established to facilitate symptoms management and basic strengthening and/or segment mobility.  Baseline: issued at visit 1 Goal status: INITIAL  2.  Pt to demonstrate improvement in transfers power AEB 5xSTS<17seconds. Baseline: 5XSTS 20sec on visit 1 Goal  status: INITIAL  LONG TERM GOALS: Target date: 01/15/24 Pt to demonstrate ability to perform without pain limitation, less than 2/10 increase in NPRS, and with >10% improvement in distance to improve ability to participate in IADL and social events.   Baseline: pending assessment visit 2  Goal status: INITIAL  2.  Patient to demonstrate improved performance on initial transfers assessment AEB improved reps per time or time per perform desired reps and/or reduced seat height and/or use of hands in order to improve safety, tolerance, and independence in ADL performance.   Baseline: visit 1 5xSTS: 20sec Goal status: INITIAL  3.  Pt to report ability to perform housework an dlighthousehold activities ad lib without painful restrictions to these activities.  Baseline: limited by pain at eval Goal status: INITIAL  PLAN:  PT FREQUENCY: 1-2x/week  PT DURATION: 8 weeks  PLANNED INTERVENTIONS: 97110-Therapeutic exercises, 97530- Therapeutic activity, 97112- Neuromuscular re-education, 97535- Self Care, [id number]- Manual therapy, [id number]- Electrical stimulation (unattended), [phone]- Electrical stimulation (manual), Patient/Family education, Balance training, Stair training, Joint mobilization, Joint manipulation, Visual/preceptual remediation/compensation, DME instructions, Cryotherapy, and Moist heat  PLAN FOR NEXT SESSION:   continue with graded exercise and progress intensity of interval gait training with 4WW vs no AD  or SPC.   10:19 AM, 11/27/23   Massie FORBES Dollar, PT 11/27/2023, 10:19 AM

## 2023-11-28 ENCOUNTER — Ambulatory Visit: Payer: Self-pay

## 2023-11-28 ENCOUNTER — Encounter

## 2023-11-28 DIAGNOSIS — Z87898 Personal history of other specified conditions: Secondary | ICD-10-CM | POA: Diagnosis not present

## 2023-11-28 DIAGNOSIS — I272 Pulmonary hypertension, unspecified: Secondary | ICD-10-CM | POA: Diagnosis not present

## 2023-11-28 DIAGNOSIS — I639 Cerebral infarction, unspecified: Secondary | ICD-10-CM | POA: Diagnosis not present

## 2023-11-28 DIAGNOSIS — R600 Localized edema: Secondary | ICD-10-CM | POA: Diagnosis not present

## 2023-11-28 DIAGNOSIS — N1832 Chronic kidney disease, stage 3b: Secondary | ICD-10-CM | POA: Diagnosis not present

## 2023-11-28 DIAGNOSIS — R278 Other lack of coordination: Secondary | ICD-10-CM

## 2023-11-28 DIAGNOSIS — I10 Essential (primary) hypertension: Secondary | ICD-10-CM | POA: Diagnosis not present

## 2023-11-28 DIAGNOSIS — R001 Bradycardia, unspecified: Secondary | ICD-10-CM | POA: Diagnosis not present

## 2023-11-28 DIAGNOSIS — E785 Hyperlipidemia, unspecified: Secondary | ICD-10-CM | POA: Diagnosis not present

## 2023-11-28 DIAGNOSIS — M5481 Occipital neuralgia: Secondary | ICD-10-CM

## 2023-11-28 DIAGNOSIS — Z8673 Personal history of transient ischemic attack (TIA), and cerebral infarction without residual deficits: Secondary | ICD-10-CM

## 2023-11-28 NOTE — Telephone Encounter (Signed)
 FYI Only or Action Required?: Action required by provider: referral request. For [facility].   Patient was last seen in primary care on 11/15/2023 by Cannady, Jolene T, NP.  Called Nurse Triage reporting Headache.  Symptoms began several years ago.  Interventions attempted: OTC medications: tylenol .  Symptoms are: unchanged.  Triage Disposition: Home Care  Patient/caregiver understands and will follow disposition?: Yes, will follow disposition  Reason for Disposition  Headache  Answer Assessment - Initial Assessment Questions 1. LOCATION: Where does it hurt?      R side of head 2. ONSET: When did the headache start? (e.g., minutes, hours, days)      Ongoing for years 3. PATTERN: Does the pain come and go, or has it been constant since it started?     constant 4. SEVERITY: How bad is the pain? and What does it keep you from doing?  (e.g., Scale 1-10; mild, moderate, or severe)     Mild, worse in the morning 5. RECURRENT SYMPTOM: Have you ever had headaches before? If Yes, ask: When was the last time? and What happened that time?      States for last 3 years, has been getting shots in the head 6. CAUSE: What do you think is causing the headache?     unsure 8. HEAD INJURY: Has there been any recent injury to your head?      denies 9. OTHER SYMPTOMS: Do you have any other symptoms? (e.g., fever, stiff neck, eye pain, sore throat, cold symptoms)     denies  Protocols used: Headache-A-AH

## 2023-11-28 NOTE — Telephone Encounter (Signed)
 Called and spoke to patient. She states she was wanting to go to [facility] in [place] for her second opinion.

## 2023-11-28 NOTE — Telephone Encounter (Signed)
 Was this the referral that was entered to [facility] on 08/29/23?

## 2023-11-28 NOTE — Addendum Note (Signed)
 Addended by: Sutton Hirsch T on: 11/28/2023 07:05 PM   Modules accepted: Orders

## 2023-11-28 NOTE — Telephone Encounter (Signed)
 This RN made first attempt to triage patient. No answer, unable to LVM. Routing for additional attempts.   Copied from CRM [id number]. Topic: General - Other >> Nov 28, 2023  8:41 AM Tracie Fisher wrote: Reason for CRM: pt stated that Dr.Cannady was supposed to make her an appt to be seen about the headaches she's been having on her right side everyday. Can Dr Valerio follow up with pt to get this scheduled.

## 2023-11-29 ENCOUNTER — Ambulatory Visit: Admitting: Physical Therapy

## 2023-11-29 ENCOUNTER — Encounter: Admitting: Physical Therapy

## 2023-11-29 DIAGNOSIS — M25561 Pain in right knee: Secondary | ICD-10-CM | POA: Diagnosis not present

## 2023-11-29 DIAGNOSIS — R269 Unspecified abnormalities of gait and mobility: Secondary | ICD-10-CM | POA: Diagnosis not present

## 2023-11-29 DIAGNOSIS — R278 Other lack of coordination: Secondary | ICD-10-CM | POA: Diagnosis not present

## 2023-11-29 DIAGNOSIS — M6281 Muscle weakness (generalized): Secondary | ICD-10-CM | POA: Diagnosis not present

## 2023-11-29 DIAGNOSIS — R262 Difficulty in walking, not elsewhere classified: Secondary | ICD-10-CM | POA: Diagnosis not present

## 2023-11-29 DIAGNOSIS — R2681 Unsteadiness on feet: Secondary | ICD-10-CM

## 2023-11-29 NOTE — Telephone Encounter (Signed)
 Called and LVM notifying patient that Tracie Fisher entered the new referral for her and that she should be expecting a call to get scheduled.

## 2023-11-29 NOTE — Therapy (Unsigned)
 OUTPATIENT PHYSICAL THERAPY TREATMENT  Patient Name: Tracie Fisher MRN: [id number] DOB:05/15/1936, 88 y.o., female Today's Date: 11/29/2023  END OF SESSION:  PT End of Session - 11/29/23 1355     Visit Number 6    Number of Visits 16    Date for PT Re-Evaluation 01/09/24    Authorization Type [employer] Medicare    Authorization Time Period 11/14/23-01/09/24    Progress Note Due on Visit 10    PT Start Time 1400    PT Stop Time 1442    PT Time Calculation (min) 42 min    Equipment Utilized During Treatment Gait belt    Activity Tolerance Patient tolerated treatment well;No increased pain    Behavior During Therapy WFL for tasks assessed/performed            Past Medical History:  Diagnosis Date   Cancer (HCC)    SKIN   Complication of anesthesia    Depression    Dysrhythmia    GERD (gastroesophageal reflux disease)    Hiatal hernia    HOH (hard of hearing)    Hypertension    Hypothyroidism    PONV (postoperative nausea and vomiting)    Thyroid  disease    Past Surgical History:  Procedure Laterality Date   APPENDECTOMY     BACK SURGERY     MULTIPLE/ROD IN BACK   BREAST CYST ASPIRATION Left years ago   CATARACT EXTRACTION W/PHACO Left 03/21/2017   Procedure: CATARACT EXTRACTION PHACO AND INTRAOCULAR LENS PLACEMENT (IOC);  Surgeon: Jaye Fallow, MD;  Location: [facility];  Service: Ophthalmology;  Laterality: Left;  US  00:48 AP% 17.8 CDE 8.57 Fluid pack lot # [id number]   CATARACT EXTRACTION W/PHACO Right 04/11/2017   Procedure: CATARACT EXTRACTION PHACO AND INTRAOCULAR LENS PLACEMENT (IOC);  Surgeon: Jaye Fallow, MD;  Location: [facility];  Service: Ophthalmology;  Laterality: Right;  US  01:09.5 AP% 15.8 CDE 11.04 Fluid Pack lot # [id number]   CHOLECYSTECTOMY     COLON SURGERY     BOWEL OBSTRUCTION   COLONOSCOPY WITH PROPOFOL  N/A 04/25/2016   Procedure: COLONOSCOPY WITH PROPOFOL ;  Surgeon: Ruel Kung, MD;  Location: [facility];  Service: Endoscopy;   Laterality: N/A;   ESOPHAGOGASTRODUODENOSCOPY (EGD) WITH PROPOFOL  N/A 04/25/2016   Procedure: ESOPHAGOGASTRODUODENOSCOPY (EGD) WITH PROPOFOL ;  Surgeon: Ruel Kung, MD;  Location: [facility];  Service: Endoscopy;  Laterality: N/A;   EXPLORATORY LAPAROTOMY  10/18/2011   blockage small intestines   GIVENS CAPSULE STUDY N/A 05/11/2016   Procedure: GIVENS CAPSULE STUDY;  Surgeon: Ruel Kung, MD;  Location: [facility];  Service: Endoscopy;  Laterality: N/A;   Patient Active Problem List   Diagnosis Date Noted   Carotid stenosis 10/22/2023   Long-term current use of benzodiazepine 08/23/2023   Lymphedema 03/29/2023   Anemia due to stage 4 chronic kidney disease (HCC) 02/04/2023   Overweight (BMI 25.0-29.9) 08/05/2022   Vision loss 08/04/2022   Senile purpura (HCC) 06/16/2022   Insomnia 06/16/2022   Varicose veins of both lower extremities without ulcer or inflammation 06/16/2022   Mixed hyperlipidemia 06/12/2022   Occipital neuralgia 06/12/2022   Sensory ataxia 06/12/2022   Osteopenia of left forearm 06/12/2022   CMC arthritis 05/23/2022   CKD (chronic kidney disease) stage 4, GFR 15-29 ml/min (HCC) 03/03/2022   Hypothyroidism, adult 03/01/2022   Loss of memory 07/26/2021   Moderate pulmonary hypertension (HCC) 05/20/2021   Primary osteoarthritis involving multiple joints 03/24/2021   Unsteady gait 10/16/2019   History of stroke 03/04/2019   Bilateral foot pain  03/06/2018   Chronic bilateral low back pain 03/06/2018   Neck pain 03/06/2018   Neuropathy 02/06/2018   History of nonmelanoma skin cancer 09/27/2016   Essential hypertension 09/02/2016   Hiatal hernia    Stricture of esophagus    Iron  deficiency anemia, unspecified 03/23/2016   History of tachycardia 07/29/2015   PCP: [name], NP  REFERRING PROVIDER: Lynwood Hue, MD  REFERRING DIAG: Rt knee pain  THERAPY DIAG:  Abnormality of gait and mobility  Acute pain of right knee  Difficulty in walking, not  elsewhere classified  Muscle weakness (generalized)  Unsteadiness on feet  Rationale for Evaluation and Treatment: Rehabilitation  ONSET DATE: June 2025  SUBJECTIVE:  SUBJECTIVE STATEMENT: Pt reports that she is feeling a little better today, no updates since last session. Was a little sore after last session but is better now.   Reports that HA is only bothering her a little bit at start of PT treatment:   Still reports soreness in bil knees but no pain.   PERTINENT HISTORY: 88yoF referred to OPPT for acute insidious Rt medial knee pain. Pt seen by Dr. Hue [facility] orthopedics, per pt he recommended PT to gain muscle and strength in this knee, but generally the joint looked quite favorable. Unrelated, pt reports significant debility and difficulty walking without limiting DOE since being hospitalized last year- this has created more of activity restriction than her knee pain alone.   PAIN:  Are you having pain? No pain today; knee ache and significant back pain last night.   PRECAUTIONS: None  WEIGHT BEARING RESTRICTIONS: No  FALLS:  Has patient fallen in last 6 months? Yes. Number of falls 1  LIVING ENVIRONMENT: Lives with: lives with their family and lives with their son Stairs: No Has following equipment at home: Single point cane and Environmental consultant - 2 wheeled  OCCUPATION: retired   PLOF: drives, AMB with SPC more recently or walker last year when she left [facility], history of falls/neuropathy.   PATIENT GOALS: resolution of knee pain   OBJECTIVE:  Note: Objective measures were completed at evaluation unless otherwise noted.  DIAGNOSTIC FINDINGS:   Recently seen by orthopedics. PATIENT SURVEYS:  Attempted LEFS and KOOD, neither of which were compatible with pt's self-report capacity.  COGNITION: Overall cognitive status: globally WNL, mentation somewhat limiting to ability to response to LEFS/KOOS in a meaningful way.   EDEMA:  None seen upon visual inspection   PALPATION: Rt knee extensor mechanism; not tender, no focal or global edema/bogginess, healthy muscle mass JOINT RANGE:   ROM Right eval Left eval  Knee flexion >110*   Knee extension full full   *pt reports not limited, flexoin at baseline  FUNCTIONAL TESTS:  5xSTS: 20sec, hands free, no LOB, mild antalgia reps 4-5  431ft AMB  overground: SPC use, 8 minutes, 38 sec (11/16/23)   GAIT: Distance walked: 22 meters Assistive device utilized: SPC  Level of assistance: ModI  Comments: slow, appears to be limited more by baseline balance deficits  TODAY'S TREATMENT 11/29/23 :     TE- To improve strength, endurance, mobility, and function of specific targeted muscle groups or improve joint range of motion or improve muscle flexibility  Nustep AAROM, reciprocal movement, and activity tolerance training x 6 min at level 1 no rest break  LAQ with 3# AW 2 x 15 ea LE  Adduction squeeze 20 x 2 HS curl with GTB 2 x 15   TA- To improve functional movements patterns for everyday tasks  Standing March with #3 AW 15x; 2 sets  Standing heel raise 2 x 15 with 3# AW  Standing hip ABD 2 x 10 ea LE Step on/ off airex pad no UE support 2 x 10 ea LE  Sit<>stand without UE support x 8    PT remained attentive to pt needs and provided multiple therapeutic rest breaks as needed between sets due to reported BLE fatigue.   PATIENT EDUCATION:  Education details: Pt educated throughout session about proper posture and technique with exercises. Improved exercise technique, movement at target joints, use of target muscles after min to mod verbal, visual, tactile cues. Person educated: Pt  Education method: collaborative learning, deliberate practice, positive reinforcement, explicit instruction, establish rules. Education comprehension: seems ok; FU in future to determine LT  comprehension/adherence   Benefits of ergonomic handle on cane for hand pain management at metacarpals.   HOME EXERCISE PROGRAM: Access Code: [email or web address] URL: [email or web address] Date: 11/14/2023 Prepared by: Peggye Linear  Exercises - Supine Knee Extension Strengthening  - 2 x daily - 1 sets - 15 reps - Supine Bridge  - 2 x daily - 1 sets - 10 reps (low range as not to aggravate chronic low back pain)  - Seated Long Arc Quad  - 2 x daily - 1 sets - 10 reps - Heel Raises with Counter Support  - 2 x daily - 1 sets - 15 reps  ASSESSMENT:  CLINICAL IMPRESSION: Continued with current plan of care as laid out in evaluation and recent prior sessions. Pt remains motivated to advance progress toward goals in order to maximize independence and safety at home. Pt requires high level assistance and cuing for completion of exercises in order to provide adequate level of stimulation challenge while minimizing pain and discomfort when possible. Pt closely monitored throughout session pt response and to maximize patient safety during interventions. Pt continues to demonstrate progress toward goals AEB progression of interventions this date either in volume or intensity.      OBJECTIVE IMPAIRMENTS: Decreased knowledge of condition, decreased use of DME, decreased mobility, difficulty walking, decreased strength.  ACTIVITY LIMITATIONS: Lifting, standing, walking, squatting, transfers, locomotion level PARTICIPATION LIMITATIONS: Cleaning, laundry, interpersonal relationships, driving, yardwork, community activity.  PERSONAL FACTORS: Age, behavior pattern, education, past/current experiences, transportation, profession  are also affecting patient's functional outcome.  REHAB POTENTIAL: Good.  CLINICAL DECISION MAKING: moderate  EVALUATION COMPLEXITY: Stable/uncomplicated   GOALS: Goals reviewed with patient? No  SHORT TERM GOALS: Target date: 12/15/23 Patient will report comprehension,  confidence, and consistent compliance and of a simple home exercise program established to facilitate symptoms management and basic strengthening and/or segment mobility.  Baseline: issued at visit 1 Goal status: INITIAL  2.  Pt to demonstrate improvement in transfers power AEB 5xSTS<17seconds. Baseline: 5XSTS 20sec on visit 1 Goal status: INITIAL  LONG TERM GOALS: Target date: 01/15/24 Pt to demonstrate ability to perform without pain limitation, less than 2/10 increase in NPRS, and with >10% improvement in distance to improve ability to participate  in IADL and social events.   Baseline: pending assessment visit 2  Goal status: INITIAL  2.  Patient to demonstrate improved performance on initial transfers assessment AEB improved reps per time or time per perform desired reps and/or reduced seat height and/or use of hands in order to improve safety, tolerance, and independence in ADL performance.   Baseline: visit 1 5xSTS: 20sec Goal status: INITIAL  3.  Pt to report ability to perform housework an dlighthousehold activities ad lib without painful restrictions to these activities.  Baseline: limited by pain at eval Goal status: INITIAL  PLAN:  PT FREQUENCY: 1-2x/week  PT DURATION: 8 weeks  PLANNED INTERVENTIONS: 97110-Therapeutic exercises, 97530- Therapeutic activity, 97112- Neuromuscular re-education, 97535- Self Care, 02859- Manual therapy, G0283- Electrical stimulation (unattended), [phone]- Electrical stimulation (manual), Patient/Family education, Balance training, Stair training, Joint mobilization, Joint manipulation, Visual/preceptual remediation/compensation, DME instructions, Cryotherapy, and Moist heat  PLAN FOR NEXT SESSION:   continue with graded exercise and progress intensity of interval gait training with 4WW vs no AD or SPC.   1:56 PM, 11/29/23   Lonni KATHEE Gainer, PT 11/29/2023, 1:56 PM

## 2023-11-30 ENCOUNTER — Ambulatory Visit: Payer: Self-pay

## 2023-11-30 DIAGNOSIS — H903 Sensorineural hearing loss, bilateral: Secondary | ICD-10-CM | POA: Diagnosis not present

## 2023-11-30 DIAGNOSIS — H6123 Impacted cerumen, bilateral: Secondary | ICD-10-CM | POA: Diagnosis not present

## 2023-11-30 NOTE — Telephone Encounter (Signed)
 Pt calling in reference to the referral to [facility].  States that she does not get VM on her cell phone. Please call pt back.  Pt spoke to NT on 7/15 and states that her s/s are the same, no change.   Copied from CRM [id number]. Topic: Clinical - Red Word Triage >> Nov 30, 2023 10:20 AM Powell HERO wrote: Red Word that prompted transfer to Nurse Triage: States she has a bad headache and she has is every single day. Only getting worse. Always on the top or right side.

## 2023-11-30 NOTE — Telephone Encounter (Signed)
 See other phone encounter.

## 2023-12-04 DIAGNOSIS — I129 Hypertensive chronic kidney disease with stage 1 through stage 4 chronic kidney disease, or unspecified chronic kidney disease: Secondary | ICD-10-CM | POA: Diagnosis not present

## 2023-12-04 DIAGNOSIS — E875 Hyperkalemia: Secondary | ICD-10-CM | POA: Diagnosis not present

## 2023-12-04 DIAGNOSIS — N1832 Chronic kidney disease, stage 3b: Secondary | ICD-10-CM | POA: Diagnosis not present

## 2023-12-04 DIAGNOSIS — R600 Localized edema: Secondary | ICD-10-CM | POA: Diagnosis not present

## 2023-12-04 NOTE — Therapy (Signed)
 OUTPATIENT PHYSICAL THERAPY TREATMENT  Patient Name: Tracie Fisher MRN: [id number] DOB:Aug 06, 1935, 88 y.o., female Today's Date: 12/05/2023  END OF SESSION:  PT End of Session - 12/05/23 1110     Visit Number 7    Number of Visits 16    Date for PT Re-Evaluation 01/09/24    Authorization Type [employer] Medicare    Authorization Time Period 11/14/23-01/09/24    Progress Note Due on Visit 10    PT Start Time 1101    PT Stop Time 1144    PT Time Calculation (min) 43 min    Equipment Utilized During Treatment Gait belt    Activity Tolerance Patient tolerated treatment well;No increased pain    Behavior During Therapy WFL for tasks assessed/performed             Past Medical History:  Diagnosis Date   Cancer (HCC)    SKIN   Complication of anesthesia    Depression    Dysrhythmia    GERD (gastroesophageal reflux disease)    Hiatal hernia    HOH (hard of hearing)    Hypertension    Hypothyroidism    PONV (postoperative nausea and vomiting)    Thyroid  disease    Past Surgical History:  Procedure Laterality Date   APPENDECTOMY     BACK SURGERY     MULTIPLE/ROD IN BACK   BREAST CYST ASPIRATION Left years ago   CATARACT EXTRACTION W/PHACO Left 03/21/2017   Procedure: CATARACT EXTRACTION PHACO AND INTRAOCULAR LENS PLACEMENT (IOC);  Surgeon: Jaye Fallow, MD;  Location: [facility];  Service: Ophthalmology;  Laterality: Left;  US  00:48 AP% 17.8 CDE 8.57 Fluid pack lot # [id number]   CATARACT EXTRACTION W/PHACO Right 04/11/2017   Procedure: CATARACT EXTRACTION PHACO AND INTRAOCULAR LENS PLACEMENT (IOC);  Surgeon: Jaye Fallow, MD;  Location: [facility];  Service: Ophthalmology;  Laterality: Right;  US  01:09.5 AP% 15.8 CDE 11.04 Fluid Pack lot # [id number]   CHOLECYSTECTOMY     COLON SURGERY     BOWEL OBSTRUCTION   COLONOSCOPY WITH PROPOFOL  N/A 04/25/2016   Procedure: COLONOSCOPY WITH PROPOFOL ;  Surgeon: Ruel Kung, MD;  Location: [facility];  Service: Endoscopy;   Laterality: N/A;   ESOPHAGOGASTRODUODENOSCOPY (EGD) WITH PROPOFOL  N/A 04/25/2016   Procedure: ESOPHAGOGASTRODUODENOSCOPY (EGD) WITH PROPOFOL ;  Surgeon: Ruel Kung, MD;  Location: [facility];  Service: Endoscopy;  Laterality: N/A;   EXPLORATORY LAPAROTOMY  10/18/2011   blockage small intestines   GIVENS CAPSULE STUDY N/A 05/11/2016   Procedure: GIVENS CAPSULE STUDY;  Surgeon: Ruel Kung, MD;  Location: [facility];  Service: Endoscopy;  Laterality: N/A;   Patient Active Problem List   Diagnosis Date Noted   Carotid stenosis 10/22/2023   Long-term current use of benzodiazepine 08/23/2023   Lymphedema 03/29/2023   Anemia due to stage 4 chronic kidney disease (HCC) 02/04/2023   Overweight (BMI 25.0-29.9) 08/05/2022   Vision loss 08/04/2022   Senile purpura (HCC) 06/16/2022   Insomnia 06/16/2022   Varicose veins of both lower extremities without ulcer or inflammation 06/16/2022   Mixed hyperlipidemia 06/12/2022   Occipital neuralgia 06/12/2022   Sensory ataxia 06/12/2022   Osteopenia of left forearm 06/12/2022   CMC arthritis 05/23/2022   CKD (chronic kidney disease) stage 4, GFR 15-29 ml/min (HCC) 03/03/2022   Hypothyroidism, adult 03/01/2022   Loss of memory 07/26/2021   Moderate pulmonary hypertension (HCC) 05/20/2021   Primary osteoarthritis involving multiple joints 03/24/2021   Unsteady gait 10/16/2019   History of stroke 03/04/2019   Bilateral foot  pain 03/06/2018   Chronic bilateral low back pain 03/06/2018   Neck pain 03/06/2018   Neuropathy 02/06/2018   History of nonmelanoma skin cancer 09/27/2016   Essential hypertension 09/02/2016   Hiatal hernia    Stricture of esophagus    Iron  deficiency anemia, unspecified 03/23/2016   History of tachycardia 07/29/2015   PCP: [name], NP  REFERRING PROVIDER: Lynwood Hue, MD  REFERRING DIAG: Rt knee pain  THERAPY DIAG:  Abnormality of gait and mobility  Acute pain of right knee  Difficulty in walking, not  elsewhere classified  Muscle weakness (generalized)  Unsteadiness on feet  Rationale for Evaluation and Treatment: Rehabilitation  ONSET DATE: June 2025  SUBJECTIVE:  SUBJECTIVE STATEMENT: Pt reports that she tired and just can't seem to shake it. States was up walking earlier this morning - down near the gift shop. States ongoing daily headaches.   Reports that HA is only bothering her a little bit at start of PT treatment:   Still reports soreness in bil knees but no pain.   PERTINENT HISTORY: 88yoF referred to OPPT for acute insidious Rt medial knee pain. Pt seen by Dr. Hue [facility] orthopedics, per pt he recommended PT to gain muscle and strength in this knee, but generally the joint looked quite favorable. Unrelated, pt reports significant debility and difficulty walking without limiting DOE since being hospitalized last year- this has created more of activity restriction than her knee pain alone.   PAIN:  Are you having pain? No pain today; knee ache and significant back pain last night.   PRECAUTIONS: None  WEIGHT BEARING RESTRICTIONS: No  FALLS:  Has patient fallen in last 6 months? Yes. Number of falls 1  LIVING ENVIRONMENT: Lives with: lives with their family and lives with their son Stairs: No Has following equipment at home: Single point cane and Environmental consultant - 2 wheeled  OCCUPATION: retired   PLOF: drives, AMB with SPC more recently or walker last year when she left [employer], history of falls/neuropathy.   PATIENT GOALS: resolution of knee pain   OBJECTIVE:  Note: Objective measures were completed at evaluation unless otherwise noted.  DIAGNOSTIC FINDINGS:   Recently seen by orthopedics. PATIENT SURVEYS:  Attempted LEFS and KOOD, neither of which were compatible with pt's self-report capacity.  COGNITION: Overall cognitive status: globally WNL, mentation somewhat limiting to ability to response to LEFS/KOOS in a meaningful way.   EDEMA:  None seen upon visual  inspection  PALPATION: Rt knee extensor mechanism; not tender, no focal or global edema/bogginess, healthy muscle mass JOINT RANGE:   ROM Right eval Left eval  Knee flexion >110*   Knee extension full full   *pt reports not limited, flexoin at baseline  FUNCTIONAL TESTS:  5xSTS: 20sec, hands free, no LOB, mild antalgia reps 4-5  422ft AMB  overground: SPC use, 8 minutes, 38 sec (11/16/23)   GAIT: Distance walked: 22 meters Assistive device utilized: SPC  Level of assistance: ModI  Comments: slow, appears to be limited more by baseline balance deficits  TODAY'S TREATMENT 12/05/23 :      TA- To improve functional movements patterns for everyday tasks    Step on/ off 6  block with UE support 2.5 # AW - x12 ea LE  Sit<>stand without UE support x 10 Resistive gait in clinic x 180 feet 2.5 # (slow deliberate pace)   NMR:  High knee march walk with #2.5 AW in // bars- down and back x 10 Fwd stepping over obstacles in // bars - 4 PVC pipes -down and back x 10 Side stepping over obstacles in // bars - 4 PVC pipes- down and back x 10 (VC for proper placement)    PT remained attentive to pt needs and provided multiple therapeutic rest breaks as needed between sets due to reported BLE fatigue.   PATIENT EDUCATION:  Education details: Pt educated throughout session about proper posture and technique with exercises. Improved exercise technique, movement at target joints, use of target muscles after min to mod verbal, visual, tactile cues. Person educated: Pt  Education method: collaborative learning, deliberate practice, positive reinforcement, explicit instruction, establish rules. Education comprehension: seems ok; FU in future to determine LT comprehension/adherence   Benefits of ergonomic handle on cane for hand pain management at metacarpals.   HOME  EXERCISE PROGRAM: Access Code: [email or web address] URL: [email or web address] Date: 11/14/2023 Prepared by: Peggye Linear  Exercises - Supine Knee Extension Strengthening  - 2 x daily - 1 sets - 15 reps - Supine Bridge  - 2 x daily - 1 sets - 10 reps (low range as not to aggravate chronic low back pain)  - Seated Long Arc Quad  - 2 x daily - 1 sets - 10 reps - Heel Raises with Counter Support  - 2 x daily - 1 sets - 15 reps  ASSESSMENT:  CLINICAL IMPRESSION: Treatment continues to focus on progressing standing functional activities incorporating resistive strengthening and balance. She was able to step up/over obstacles today without significant difficulty. She exhibited good reciprocal steps overall today with no LOB.  Pt will continue to benefit from skilled physical therapy intervention to address impairments, improve QOL, and attain therapy goals.       OBJECTIVE IMPAIRMENTS: Decreased knowledge of condition, decreased use of DME, decreased mobility, difficulty walking, decreased strength.  ACTIVITY LIMITATIONS: Lifting, standing, walking, squatting, transfers, locomotion level PARTICIPATION LIMITATIONS: Cleaning, laundry, interpersonal relationships, driving, yardwork, community activity.  PERSONAL FACTORS: Age, behavior pattern, education, past/current experiences, transportation, profession  are also affecting patient's functional outcome.  REHAB POTENTIAL: Good.  CLINICAL DECISION MAKING: moderate  EVALUATION COMPLEXITY: Stable/uncomplicated   GOALS: Goals reviewed with patient? No  SHORT TERM GOALS: Target date: 12/15/23 Patient will report comprehension, confidence, and consistent compliance and of a simple home exercise program established to facilitate symptoms management and basic strengthening and/or segment mobility.  Baseline: issued at visit 1 Goal status: INITIAL  2.  Pt to demonstrate improvement in transfers power AEB 5xSTS<17seconds. Baseline: 5XSTS 20sec  on visit 1 Goal status: INITIAL  LONG TERM GOALS: Target date: 01/15/24 Pt to demonstrate ability to perform without pain limitation, less than 2/10 increase in NPRS, and with >10% improvement in distance to improve ability to participate in IADL and social events.   Baseline: pending assessment visit 2  Goal status: INITIAL  2.  Patient to demonstrate improved performance on initial transfers assessment AEB improved reps per time or time per perform desired reps and/or reduced seat height and/or use of hands in order to improve safety, tolerance, and independence  in ADL performance.   Baseline: visit 1 5xSTS: 20sec Goal status: INITIAL  3.  Pt to report ability to perform housework an dlighthousehold activities ad lib without painful restrictions to these activities.  Baseline: limited by pain at eval Goal status: INITIAL  PLAN:  PT FREQUENCY: 1-2x/week  PT DURATION: 8 weeks  PLANNED INTERVENTIONS: 97110-Therapeutic exercises, 97530- Therapeutic activity, 97112- Neuromuscular re-education, 97535- Self Care, 02859- Manual therapy, G0283- Electrical stimulation (unattended), [phone]- Electrical stimulation (manual), Patient/Family education, Balance training, Stair training, Joint mobilization, Joint manipulation, Visual/preceptual remediation/compensation, DME instructions, Cryotherapy, and Moist heat  PLAN FOR NEXT SESSION:   continue with graded exercise and progress intensity of interval gait training with 4WW vs no AD or SPC.   1:17 PM, 12/05/23   Reyes LOISE London, PT 12/05/2023, 1:17 PM

## 2023-12-05 ENCOUNTER — Telehealth: Payer: Self-pay

## 2023-12-05 ENCOUNTER — Ambulatory Visit

## 2023-12-05 DIAGNOSIS — R269 Unspecified abnormalities of gait and mobility: Secondary | ICD-10-CM

## 2023-12-05 DIAGNOSIS — I129 Hypertensive chronic kidney disease with stage 1 through stage 4 chronic kidney disease, or unspecified chronic kidney disease: Secondary | ICD-10-CM | POA: Diagnosis not present

## 2023-12-05 DIAGNOSIS — M6281 Muscle weakness (generalized): Secondary | ICD-10-CM

## 2023-12-05 DIAGNOSIS — R2681 Unsteadiness on feet: Secondary | ICD-10-CM | POA: Diagnosis not present

## 2023-12-05 DIAGNOSIS — R278 Other lack of coordination: Secondary | ICD-10-CM | POA: Diagnosis not present

## 2023-12-05 DIAGNOSIS — R262 Difficulty in walking, not elsewhere classified: Secondary | ICD-10-CM | POA: Diagnosis not present

## 2023-12-05 DIAGNOSIS — N1832 Chronic kidney disease, stage 3b: Secondary | ICD-10-CM | POA: Diagnosis not present

## 2023-12-05 DIAGNOSIS — M25561 Pain in right knee: Secondary | ICD-10-CM

## 2023-12-05 DIAGNOSIS — M5481 Occipital neuralgia: Secondary | ICD-10-CM

## 2023-12-05 DIAGNOSIS — E875 Hyperkalemia: Secondary | ICD-10-CM | POA: Diagnosis not present

## 2023-12-05 DIAGNOSIS — R600 Localized edema: Secondary | ICD-10-CM | POA: Diagnosis not present

## 2023-12-05 NOTE — Telephone Encounter (Signed)
 Referral team, can you check on this referral for the patient? It looks like it was entered on 11/28/23 and I dont see that a letter was sent to the patient.

## 2023-12-05 NOTE — Telephone Encounter (Signed)
 Copied from CRM #[id number]. Topic: Referral - Status >> Dec 05, 2023  1:59 PM Nathanel BROCKS wrote: Reason for CRM: pt called about the referral for a neruologist appt. Please call and advise. Pt stated that she has had headaches really bad for the past 7 mornings.

## 2023-12-06 NOTE — Telephone Encounter (Signed)
Routing to provider for referral

## 2023-12-07 ENCOUNTER — Ambulatory Visit

## 2023-12-07 DIAGNOSIS — M6281 Muscle weakness (generalized): Secondary | ICD-10-CM

## 2023-12-07 DIAGNOSIS — R262 Difficulty in walking, not elsewhere classified: Secondary | ICD-10-CM | POA: Diagnosis not present

## 2023-12-07 DIAGNOSIS — R269 Unspecified abnormalities of gait and mobility: Secondary | ICD-10-CM

## 2023-12-07 DIAGNOSIS — M25561 Pain in right knee: Secondary | ICD-10-CM | POA: Diagnosis not present

## 2023-12-07 DIAGNOSIS — R2681 Unsteadiness on feet: Secondary | ICD-10-CM | POA: Diagnosis not present

## 2023-12-07 DIAGNOSIS — R278 Other lack of coordination: Secondary | ICD-10-CM

## 2023-12-07 NOTE — Therapy (Signed)
 OUTPATIENT PHYSICAL THERAPY TREATMENT  Patient Name: Tracie Fisher MRN: [id number] DOB:[birth year], 88 y.o., female Today's Date: 12/07/2023  END OF SESSION:  PT End of Session - 12/07/23 1053     Visit Number 8    Number of Visits 16    Date for PT Re-Evaluation 01/09/24    Authorization Type [employer] Medicare    Authorization Time Period 11/14/23-01/09/24    Progress Note Due on Visit 10    PT Start Time 1051    PT Stop Time 1135    PT Time Calculation (min) 44 min    Equipment Utilized During Treatment Gait belt    Activity Tolerance Patient tolerated treatment well;No increased pain    Behavior During Therapy WFL for tasks assessed/performed              Past Medical History:  Diagnosis Date   Cancer (HCC)    SKIN   Complication of anesthesia    Depression    Dysrhythmia    GERD (gastroesophageal reflux disease)    Hiatal hernia    HOH (hard of hearing)    Hypertension    Hypothyroidism    PONV (postoperative nausea and vomiting)    Thyroid  disease    Past Surgical History:  Procedure Laterality Date   APPENDECTOMY     BACK SURGERY     MULTIPLE/ROD IN BACK   BREAST CYST ASPIRATION Left years ago   CATARACT EXTRACTION W/PHACO Left 03/21/2017   Procedure: CATARACT EXTRACTION PHACO AND INTRAOCULAR LENS PLACEMENT (IOC);  Surgeon: Jaye Fallow, MD;  Location: [facility];  Service: Ophthalmology;  Laterality: Left;  US  00:48 AP% 17.8 CDE 8.57 Fluid pack lot # [id number]   CATARACT EXTRACTION W/PHACO Right 04/11/2017   Procedure: CATARACT EXTRACTION PHACO AND INTRAOCULAR LENS PLACEMENT (IOC);  Surgeon: Jaye Fallow, MD;  Location: [facility];  Service: Ophthalmology;  Laterality: Right;  US  01:09.5 AP% 15.8 CDE 11.04 Fluid Pack lot # [id number]   CHOLECYSTECTOMY     COLON SURGERY     BOWEL OBSTRUCTION   COLONOSCOPY WITH PROPOFOL  N/A 04/25/2016   Procedure: COLONOSCOPY WITH PROPOFOL ;  Surgeon: Ruel Kung, MD;  Location: [facility];  Service: Endoscopy;   Laterality: N/A;   ESOPHAGOGASTRODUODENOSCOPY (EGD) WITH PROPOFOL  N/A 04/25/2016   Procedure: ESOPHAGOGASTRODUODENOSCOPY (EGD) WITH PROPOFOL ;  Surgeon: Ruel Kung, MD;  Location: [facility];  Service: Endoscopy;  Laterality: N/A;   EXPLORATORY LAPAROTOMY  10/18/2011   blockage small intestines   GIVENS CAPSULE STUDY N/A 05/11/2016   Procedure: GIVENS CAPSULE STUDY;  Surgeon: Ruel Kung, MD;  Location: [facility];  Service: Endoscopy;  Laterality: N/A;   Patient Active Problem List   Diagnosis Date Noted   Carotid stenosis 10/22/2023   Long-term current use of benzodiazepine 08/23/2023   Lymphedema 03/29/2023   Anemia due to stage 4 chronic kidney disease (HCC) 02/04/2023   Overweight (BMI 25.0-29.9) 08/05/2022   Vision loss 08/04/2022   Senile purpura (HCC) 06/16/2022   Insomnia 06/16/2022   Varicose veins of both lower extremities without ulcer or inflammation 06/16/2022   Mixed hyperlipidemia 06/12/2022   Occipital neuralgia 06/12/2022   Sensory ataxia 06/12/2022   Osteopenia of left forearm 06/12/2022   CMC arthritis 05/23/2022   CKD (chronic kidney disease) stage 4, GFR 15-29 ml/min (HCC) 03/03/2022   Hypothyroidism, adult 03/01/2022   Loss of memory 07/26/2021   Moderate pulmonary hypertension (HCC) 05/20/2021   Primary osteoarthritis involving multiple joints 03/24/2021   Unsteady gait 10/16/2019   History of stroke 03/04/2019   Bilateral  foot pain 03/06/2018   Chronic bilateral low back pain 03/06/2018   Neck pain 03/06/2018   Neuropathy 02/06/2018   History of nonmelanoma skin cancer 09/27/2016   Essential hypertension 09/02/2016   Hiatal hernia    Stricture of esophagus    Iron  deficiency anemia, unspecified 03/23/2016   History of tachycardia 07/29/2015   PCP: [name], NP  REFERRING PROVIDER: Lynwood Hue, MD  REFERRING DIAG: Rt knee pain  THERAPY DIAG:  Abnormality of gait and mobility  Acute pain of right knee  Difficulty in walking, not  elsewhere classified  Muscle weakness (generalized)  Unsteadiness on feet  Other lack of coordination  Rationale for Evaluation and Treatment: Rehabilitation  ONSET DATE: June 2025  SUBJECTIVE:  SUBJECTIVE STATEMENT: Pt reports she felt good when she left last time. States still dealing with daily headaches. Denies any knee pain this morning.   PERTINENT HISTORY: 88yoF referred to OPPT for acute insidious Rt medial knee pain. Pt seen by Dr. Hue [facility] orthopedics, per pt he recommended PT to gain muscle and strength in this knee, but generally the joint looked quite favorable. Unrelated, pt reports significant debility and difficulty walking without limiting DOE since being hospitalized last year- this has created more of activity restriction than her knee pain alone.   PAIN:  Are you having pain? No pain today; knee ache and significant back pain last night.   PRECAUTIONS: None  WEIGHT BEARING RESTRICTIONS: No  FALLS:  Has patient fallen in last 6 months? Yes. Number of falls 1  LIVING ENVIRONMENT: Lives with: lives with their family and lives with their son Stairs: No Has following equipment at home: Single point cane and Environmental consultant - 2 wheeled  OCCUPATION: retired   PLOF: drives, AMB with SPC more recently or walker last year when she left [facility], history of falls/neuropathy.   PATIENT GOALS: resolution of knee pain   OBJECTIVE:  Note: Objective measures were completed at evaluation unless otherwise noted.  DIAGNOSTIC FINDINGS:   Recently seen by orthopedics. PATIENT SURVEYS:  Attempted LEFS and KOOD, neither of which were compatible with pt's self-report capacity.  COGNITION: Overall cognitive status: globally WNL, mentation somewhat limiting to ability to response to LEFS/KOOS in a meaningful way.   EDEMA:  None seen upon visual inspection  PALPATION: Rt knee extensor mechanism; not tender, no focal or global edema/bogginess, healthy muscle mass JOINT RANGE:   ROM  Right eval Left eval  Knee flexion >110*   Knee extension full full   *pt reports not limited, flexoin at baseline  FUNCTIONAL TESTS:  5xSTS: 20sec, hands free, no LOB, mild antalgia reps 4-5  457ft AMB  overground: SPC use, 8 minutes, 38 sec (11/16/23)   GAIT: Distance walked: 22 meters Assistive device utilized: SPC  Level of assistance: ModI  Comments: slow, appears to be limited more by baseline balance deficits  TODAY'S TREATMENT 12/07/23 :      TA- To improve functional movements patterns for everyday tasks    Step on/ off 6  block with UE support 20 ea LE  Sit<>stand without UE support x 12 reps  Resistive gait in clinic x 200 feet 3 # (slow deliberate pace)   NMR:  Step tap onto 6 block w/o UE support x 20   Static stand on airex pad x 30 sec x 2 with Eyes open- mild sway (No UE support)  High knee march on airex pad alt LE x 15 reps (initally unsteady- using UE Support- able to progress to hands hovering over the bar)   Static stand on airex pad with EC- 30 sec x 3 round- progressively improved each round from initialy unsteadiness and requiring some UE touch to no UE support.   Static stand on airex pad with eyes open and horizontal head turns x 10 reps (initial LOB- improved with practice)    TE:   Seated knee ext 3# AW 2 x 10 reps alt LE  Seated hip march 3# AW 2 x 10 reps alt LE     PT remained attentive to pt needs and provided multiple therapeutic rest breaks as needed between sets due to reported BLE fatigue.   PATIENT EDUCATION:  Education details: Pt educated throughout session about proper posture and technique with exercises. Improved exercise technique, movement at target joints, use of target muscles after min to mod verbal, visual, tactile cues. Person educated: Pt  Education method: collaborative learning, deliberate  practice, positive reinforcement, explicit instruction, establish rules. Education comprehension: seems ok; FU in future to determine LT comprehension/adherence   Benefits of ergonomic handle on cane for hand pain management at metacarpals.   HOME EXERCISE PROGRAM: Access Code: [email or web address] URL: [email or web address] Date: 11/14/2023 Prepared by: Peggye Linear  Exercises - Supine Knee Extension Strengthening  - 2 x daily - 1 sets - 15 reps - Supine Bridge  - 2 x daily - 1 sets - 10 reps (low range as not to aggravate chronic low back pain)  - Seated Long Arc Quad  - 2 x daily - 1 sets - 10 reps - Heel Raises with Counter Support  - 2 x daily - 1 sets - 15 reps  ASSESSMENT:  CLINICAL IMPRESSION: Patient was challenged today with dynamic balance activities- initially very unsteady (specifically with eyes closed or head turns) but did improve some with practice.  She was fatigued at end of session yet able to complete all activities with minimal rest today.  Pt will continue to benefit from skilled physical therapy intervention to address impairments, improve QOL, and attain therapy goals.       OBJECTIVE IMPAIRMENTS: Decreased knowledge of condition, decreased use of DME, decreased mobility, difficulty walking, decreased strength.  ACTIVITY LIMITATIONS: Lifting, standing, walking, squatting, transfers, locomotion level PARTICIPATION LIMITATIONS: Cleaning, laundry, interpersonal relationships, driving, yardwork, community activity.  PERSONAL FACTORS: Age, behavior pattern, education, past/current experiences, transportation, profession  are also affecting patient's functional outcome.  REHAB POTENTIAL: Good.  CLINICAL DECISION MAKING: moderate  EVALUATION COMPLEXITY: Stable/uncomplicated   GOALS: Goals reviewed with patient? No  SHORT TERM GOALS: Target date: 12/15/23 Patient will report comprehension, confidence, and consistent compliance and of a simple home exercise  program established to facilitate symptoms management and basic strengthening and/or segment mobility.  Baseline: issued at visit 1 Goal status: INITIAL  2.  Pt to demonstrate improvement in transfers power AEB 5xSTS<17seconds. Baseline: 5XSTS 20sec on visit 1  Goal status: INITIAL  LONG TERM GOALS: Target date: 01/15/24 Pt to demonstrate ability to perform without pain limitation, less than 2/10 increase in NPRS, and with >10% improvement in distance to improve ability to participate in IADL and social events.   Baseline: pending assessment visit 2  Goal status: INITIAL  2.  Patient to demonstrate improved performance on initial transfers assessment AEB improved reps per time or time per perform desired reps and/or reduced seat height and/or use of hands in order to improve safety, tolerance, and independence in ADL performance.   Baseline: visit 1 5xSTS: 20sec Goal status: INITIAL  3.  Pt to report ability to perform housework an dlighthousehold activities ad lib without painful restrictions to these activities.  Baseline: limited by pain at eval Goal status: INITIAL  PLAN:  PT FREQUENCY: 1-2x/week  PT DURATION: 8 weeks  PLANNED INTERVENTIONS: 97110-Therapeutic exercises, 97530- Therapeutic activity, 97112- Neuromuscular re-education, 97535- Self Care, 02859- Manual therapy, G0283- Electrical stimulation (unattended), [phone]- Electrical stimulation (manual), Patient/Family education, Balance training, Stair training, Joint mobilization, Joint manipulation, Visual/preceptual remediation/compensation, DME instructions, Cryotherapy, and Moist heat  PLAN FOR NEXT SESSION:   continue with graded exercise and progress intensity of interval gait training with 4WW vs no AD or SPC.   12:17 PM, 12/07/23   Reyes LOISE London, PT 12/07/2023, 12:17 PM

## 2023-12-08 DIAGNOSIS — M25542 Pain in joints of left hand: Secondary | ICD-10-CM | POA: Diagnosis not present

## 2023-12-12 ENCOUNTER — Ambulatory Visit: Admitting: Physical Therapy

## 2023-12-12 DIAGNOSIS — M6281 Muscle weakness (generalized): Secondary | ICD-10-CM | POA: Diagnosis not present

## 2023-12-12 DIAGNOSIS — R278 Other lack of coordination: Secondary | ICD-10-CM | POA: Diagnosis not present

## 2023-12-12 DIAGNOSIS — R262 Difficulty in walking, not elsewhere classified: Secondary | ICD-10-CM

## 2023-12-12 DIAGNOSIS — R269 Unspecified abnormalities of gait and mobility: Secondary | ICD-10-CM

## 2023-12-12 DIAGNOSIS — M25561 Pain in right knee: Secondary | ICD-10-CM | POA: Diagnosis not present

## 2023-12-12 DIAGNOSIS — R2681 Unsteadiness on feet: Secondary | ICD-10-CM

## 2023-12-12 NOTE — Therapy (Signed)
 OUTPATIENT PHYSICAL THERAPY TREATMENT  Patient Name: Tracie Fisher MRN: [id number] DOB:07/25/1935, 88 y.o., female Today's Date: 12/12/2023  END OF SESSION:  PT End of Session - 12/12/23 1403     Visit Number 9    Number of Visits 16    Date for PT Re-Evaluation 01/09/24    Authorization Type [employer] Medicare    Authorization Time Period 11/14/23-01/09/24    Progress Note Due on Visit 10    PT Start Time 1403    PT Stop Time 1444    PT Time Calculation (min) 41 min    Equipment Utilized During Treatment Gait belt    Activity Tolerance Patient tolerated treatment well;No increased pain    Behavior During Therapy WFL for tasks assessed/performed              Past Medical History:  Diagnosis Date   Cancer (HCC)    SKIN   Complication of anesthesia    Depression    Dysrhythmia    GERD (gastroesophageal reflux disease)    Hiatal hernia    HOH (hard of hearing)    Hypertension    Hypothyroidism    PONV (postoperative nausea and vomiting)    Thyroid  disease    Past Surgical History:  Procedure Laterality Date   APPENDECTOMY     BACK SURGERY     MULTIPLE/ROD IN BACK   BREAST CYST ASPIRATION Left years ago   CATARACT EXTRACTION W/PHACO Left 03/21/2017   Procedure: CATARACT EXTRACTION PHACO AND INTRAOCULAR LENS PLACEMENT (IOC);  Surgeon: Jaye Fallow, MD;  Location: [facility];  Service: Ophthalmology;  Laterality: Left;  US  00:48 AP% 17.8 CDE 8.57 Fluid pack lot # [id number]   CATARACT EXTRACTION W/PHACO Right 04/11/2017   Procedure: CATARACT EXTRACTION PHACO AND INTRAOCULAR LENS PLACEMENT (IOC);  Surgeon: Jaye Fallow, MD;  Location: [facility];  Service: Ophthalmology;  Laterality: Right;  US  01:09.5 AP% 15.8 CDE 11.04 Fluid Pack lot # [id number]   CHOLECYSTECTOMY     COLON SURGERY     BOWEL OBSTRUCTION   COLONOSCOPY WITH PROPOFOL  N/A 04/25/2016   Procedure: COLONOSCOPY WITH PROPOFOL ;  Surgeon: Ruel Kung, MD;  Location: [facility];  Service: Endoscopy;   Laterality: N/A;   ESOPHAGOGASTRODUODENOSCOPY (EGD) WITH PROPOFOL  N/A 04/25/2016   Procedure: ESOPHAGOGASTRODUODENOSCOPY (EGD) WITH PROPOFOL ;  Surgeon: Ruel Kung, MD;  Location: [facility];  Service: Endoscopy;  Laterality: N/A;   EXPLORATORY LAPAROTOMY  10/18/2011   blockage small intestines   GIVENS CAPSULE STUDY N/A 05/11/2016   Procedure: GIVENS CAPSULE STUDY;  Surgeon: Ruel Kung, MD;  Location: [facility];  Service: Endoscopy;  Laterality: N/A;   Patient Active Problem List   Diagnosis Date Noted   Carotid stenosis 10/22/2023   Long-term current use of benzodiazepine 08/23/2023   Lymphedema 03/29/2023   Anemia due to stage 4 chronic kidney disease (HCC) 02/04/2023   Overweight (BMI 25.0-29.9) 08/05/2022   Vision loss 08/04/2022   Senile purpura (HCC) 06/16/2022   Insomnia 06/16/2022   Varicose veins of both lower extremities without ulcer or inflammation 06/16/2022   Mixed hyperlipidemia 06/12/2022   Occipital neuralgia 06/12/2022   Sensory ataxia 06/12/2022   Osteopenia of left forearm 06/12/2022   CMC arthritis 05/23/2022   CKD (chronic kidney disease) stage 4, GFR 15-29 ml/min (HCC) 03/03/2022   Hypothyroidism, adult 03/01/2022   Loss of memory 07/26/2021   Moderate pulmonary hypertension (HCC) 05/20/2021   Primary osteoarthritis involving multiple joints 03/24/2021   Unsteady gait 10/16/2019   History of stroke 03/04/2019   Bilateral  foot pain 03/06/2018   Chronic bilateral low back pain 03/06/2018   Neck pain 03/06/2018   Neuropathy 02/06/2018   History of nonmelanoma skin cancer 09/27/2016   Essential hypertension 09/02/2016   Hiatal hernia    Stricture of esophagus    Iron  deficiency anemia, unspecified 03/23/2016   History of tachycardia 07/29/2015   PCP: [name], NP  REFERRING PROVIDER: Lynwood Hue, MD  REFERRING DIAG: Rt knee pain  THERAPY DIAG:  No diagnosis found.  Rationale for Evaluation and Treatment: Rehabilitation  ONSET  DATE: June 2025  SUBJECTIVE:  SUBJECTIVE STATEMENT:  Pt reports she felt good when she left last time. States still dealing with daily headaches. Denies any knee pain this morning.   PERTINENT HISTORY: 88yoF referred to OPPT for acute insidious Rt medial knee pain. Pt seen by Dr. Hue [facility] orthopedics, per pt he recommended PT to gain muscle and strength in this knee, but generally the joint looked quite favorable. Unrelated, pt reports significant debility and difficulty walking without limiting DOE since being hospitalized last year- this has created more of activity restriction than her knee pain alone.   PAIN:  Are you having pain? No pain today; knee ache and significant back pain last night.   PRECAUTIONS: None  WEIGHT BEARING RESTRICTIONS: No  FALLS:  Has patient fallen in last 6 months? Yes. Number of falls 1  LIVING ENVIRONMENT: Lives with: lives with their family and lives with their son Stairs: No Has following equipment at home: Single point cane and Environmental consultant - 2 wheeled  OCCUPATION: retired   PLOF: drives, AMB with SPC more recently or walker last year when she left [facility], history of falls/neuropathy.   PATIENT GOALS: resolution of knee pain   OBJECTIVE:  Note: Objective measures were completed at evaluation unless otherwise noted.  DIAGNOSTIC FINDINGS:   Recently seen by orthopedics. PATIENT SURVEYS:  Attempted LEFS and KOOD, neither of which were compatible with pt's self-report capacity.  COGNITION: Overall cognitive status: globally WNL, mentation somewhat limiting to ability to response to LEFS/KOOS in a meaningful way.   EDEMA:  None seen upon visual inspection  PALPATION: Rt knee extensor mechanism; not tender, no focal or global edema/bogginess, healthy muscle mass JOINT RANGE:   ROM Right eval Left eval  Knee flexion >110*   Knee extension full full   *pt reports not limited, flexoin at baseline  FUNCTIONAL TESTS:  5xSTS: 20sec, hands free, no  LOB, mild antalgia reps 4-5  475ft AMB  overground: SPC use, 8 minutes, 38 sec (11/16/23)   GAIT: Distance walked: 22 meters Assistive device utilized: SPC  Level of assistance: ModI  Comments: slow, appears to be limited more by baseline balance deficits                                                                                                                              TODAY'S TREATMENT 12/12/23 :      TE- To  improve strength, endurance, mobility, and function of specific targeted muscle groups or improve joint range of motion or improve muscle flexibility/TA- To improve functional movements patterns for everyday tasks   Seated LAQ x 15 reps ea LE  Gait with 2# x 160 ft Seated  Seated LAQ x 15 reps ea LE  Gait with 2# x 160 ft E  Seated march x 15 ea LE  Gait with 2# AW x 160 ft   NMR: To facilitate reeducation of movement, balance, posture, coordination, and/or proprioception/kinesthetic sense.  Static stand on airex pad x 30 sec x 2 with Eyes open- NBOS  Stance with 1 LE on airex and opposite on step 2 x 35 sec ea LE   PT remained attentive to pt needs and provided multiple therapeutic rest breaks as needed between sets due to reported BLE fatigue.   PATIENT EDUCATION:  Education details: Pt educated throughout session about proper posture and technique with exercises. Improved exercise technique, movement at target joints, use of target muscles after min to mod verbal, visual, tactile cues. Person educated: Pt  Education method: collaborative learning, deliberate practice, positive reinforcement, explicit instruction, establish rules. Education comprehension: seems ok; FU in future to determine LT comprehension/adherence   Benefits of ergonomic handle on cane for hand pain management at metacarpals.   HOME EXERCISE PROGRAM: Access Code: [email or web address] URL: [email or web address] Date: 11/14/2023 Prepared by: Peggye Linear  Exercises - Supine Knee  Extension Strengthening  - 2 x daily - 1 sets - 15 reps - Supine Bridge  - 2 x daily - 1 sets - 10 reps (low range as not to aggravate chronic low back pain)  - Seated Long Arc Quad  - 2 x daily - 1 sets - 10 reps - Heel Raises with Counter Support  - 2 x daily - 1 sets - 15 reps  ASSESSMENT:  CLINICAL IMPRESSION:  Continued with current plan of care as laid out in evaluation and recent prior sessions. Pt remains motivated to advance progress toward goals in order to maximize independence and safety at home. Pt requires high level assistance and cuing for completion of exercises in order to provide adequate level of stimulation challenge while minimizing pain and discomfort when possible.Pt requires occasional rest and water breaks due to fatigue and occasional light coughing and dry throat. Pt closely monitored throughout session pt response and to maximize patient safety during interventions. Pt continues to demonstrate progress toward goals AEB progression of interventions this date either in volume or intensity.      OBJECTIVE IMPAIRMENTS: Decreased knowledge of condition, decreased use of DME, decreased mobility, difficulty walking, decreased strength.  ACTIVITY LIMITATIONS: Lifting, standing, walking, squatting, transfers, locomotion level PARTICIPATION LIMITATIONS: Cleaning, laundry, interpersonal relationships, driving, yardwork, community activity.  PERSONAL FACTORS: Age, behavior pattern, education, past/current experiences, transportation, profession  are also affecting patient's functional outcome.  REHAB POTENTIAL: Good.  CLINICAL DECISION MAKING: moderate  EVALUATION COMPLEXITY: Stable/uncomplicated   GOALS: Goals reviewed with patient? No  SHORT TERM GOALS: Target date: 12/15/23 Patient will report comprehension, confidence, and consistent compliance and of a simple home exercise program established to facilitate symptoms management and basic strengthening and/or segment  mobility.  Baseline: issued at visit 1 Goal status: INITIAL  2.  Pt to demonstrate improvement in transfers power AEB 5xSTS<17seconds. Baseline: 5XSTS 20sec on visit 1 Goal status: INITIAL  LONG TERM GOALS: Target date: 01/15/24 Pt to demonstrate ability to perform without pain limitation, less than 2/10 increase in NPRS, and with >10%  improvement in distance to improve ability to participate in IADL and social events.   Baseline: pending assessment visit 2  Goal status: INITIAL  2.  Patient to demonstrate improved performance on initial transfers assessment AEB improved reps per time or time per perform desired reps and/or reduced seat height and/or use of hands in order to improve safety, tolerance, and independence in ADL performance.   Baseline: visit 1 5xSTS: 20sec Goal status: INITIAL  3.  Pt to report ability to perform housework an dlighthousehold activities ad lib without painful restrictions to these activities.  Baseline: limited by pain at eval Goal status: INITIAL  PLAN:  PT FREQUENCY: 1-2x/week  PT DURATION: 8 weeks  PLANNED INTERVENTIONS: 97110-Therapeutic exercises, 97530- Therapeutic activity, [id number]- Neuromuscular re-education, 97535- Self Care, 02859- Manual therapy, G0283- Electrical stimulation (unattended), [phone]- Electrical stimulation (manual), Patient/Family education, Balance training, Stair training, Joint mobilization, Joint manipulation, Visual/preceptual remediation/compensation, DME instructions, Cryotherapy, and Moist heat  PLAN FOR NEXT SESSION:   continue with graded exercise and progress intensity of interval gait training with 4WW vs no AD or SPC.  Progress note  2:07 PM, 12/12/23   Lonni KATHEE Gainer, PT 12/12/2023, 2:07 PM

## 2023-12-13 ENCOUNTER — Telehealth: Payer: Self-pay

## 2023-12-13 DIAGNOSIS — I129 Hypertensive chronic kidney disease with stage 1 through stage 4 chronic kidney disease, or unspecified chronic kidney disease: Secondary | ICD-10-CM | POA: Diagnosis not present

## 2023-12-13 DIAGNOSIS — N1832 Chronic kidney disease, stage 3b: Secondary | ICD-10-CM | POA: Diagnosis not present

## 2023-12-13 DIAGNOSIS — R600 Localized edema: Secondary | ICD-10-CM | POA: Diagnosis not present

## 2023-12-13 DIAGNOSIS — E875 Hyperkalemia: Secondary | ICD-10-CM | POA: Diagnosis not present

## 2023-12-13 NOTE — Telephone Encounter (Signed)
 Copied from CRM [id number]. Topic: Referral - Status >> Dec 13, 2023  8:41 AM Everette C wrote: Reason for CRM: The patient has called to follow up on the status of their previously requested referral for a neurologist Please contact further when possible

## 2023-12-14 ENCOUNTER — Ambulatory Visit

## 2023-12-14 DIAGNOSIS — R278 Other lack of coordination: Secondary | ICD-10-CM

## 2023-12-14 DIAGNOSIS — R269 Unspecified abnormalities of gait and mobility: Secondary | ICD-10-CM | POA: Diagnosis not present

## 2023-12-14 DIAGNOSIS — M6281 Muscle weakness (generalized): Secondary | ICD-10-CM | POA: Diagnosis not present

## 2023-12-14 DIAGNOSIS — M25561 Pain in right knee: Secondary | ICD-10-CM

## 2023-12-14 DIAGNOSIS — R2681 Unsteadiness on feet: Secondary | ICD-10-CM | POA: Diagnosis not present

## 2023-12-14 DIAGNOSIS — R262 Difficulty in walking, not elsewhere classified: Secondary | ICD-10-CM | POA: Diagnosis not present

## 2023-12-14 NOTE — Therapy (Signed)
 OUTPATIENT PHYSICAL THERAPY TREATMENT/Physical Therapy Progress Note   Dates of reporting period  11/14/2023   to   12/14/2023   Patient Name: Tracie Fisher MRN: [id number] DOB:08-Jul-1935, 88 y.o., female Today's Date: 12/14/2023  END OF SESSION:  PT End of Session - 12/14/23 1106     Visit Number 10    Number of Visits 16    Date for PT Re-Evaluation 01/09/24    Authorization Type [employer]    Authorization Time Period 11/14/23-01/09/24    Progress Note Due on Visit 10    PT Start Time 1102    PT Stop Time 1146    PT Time Calculation (min) 44 min    Equipment Utilized During Treatment Gait belt    Activity Tolerance Patient tolerated treatment well;No increased pain    Behavior During Therapy WFL for tasks assessed/performed               Past Medical History:  Diagnosis Date   Cancer (HCC)    SKIN   Complication of anesthesia    Depression    Dysrhythmia    GERD (gastroesophageal reflux disease)    Hiatal hernia    HOH (hard of hearing)    Hypertension    Hypothyroidism    PONV (postoperative nausea and vomiting)    Thyroid  disease    Past Surgical History:  Procedure Laterality Date   APPENDECTOMY     BACK SURGERY     MULTIPLE/ROD IN BACK   BREAST CYST ASPIRATION Left years ago   CATARACT EXTRACTION W/PHACO Left 03/21/2017   Procedure: CATARACT EXTRACTION PHACO AND INTRAOCULAR LENS PLACEMENT (IOC);  Surgeon: Jaye Fallow, MD;  Location: [facility];  Service: Ophthalmology;  Laterality: Left;  US  00:48 AP% 17.8 CDE 8.57 Fluid pack lot # [id number]   CATARACT EXTRACTION W/PHACO Right 04/11/2017   Procedure: CATARACT EXTRACTION PHACO AND INTRAOCULAR LENS PLACEMENT (IOC);  Surgeon: Jaye Fallow, MD;  Location: [facility];  Service: Ophthalmology;  Laterality: Right;  US  01:09.5 AP% 15.8 CDE 11.04 Fluid Pack lot # [id number]   CHOLECYSTECTOMY     COLON SURGERY     BOWEL OBSTRUCTION   COLONOSCOPY WITH PROPOFOL  N/A 04/25/2016   Procedure: COLONOSCOPY  WITH PROPOFOL ;  Surgeon: Ruel Kung, MD;  Location: [facility];  Service: Endoscopy;  Laterality: N/A;   ESOPHAGOGASTRODUODENOSCOPY (EGD) WITH PROPOFOL  N/A 04/25/2016   Procedure: ESOPHAGOGASTRODUODENOSCOPY (EGD) WITH PROPOFOL ;  Surgeon: Ruel Kung, MD;  Location: [facility];  Service: Endoscopy;  Laterality: N/A;   EXPLORATORY LAPAROTOMY  10/18/2011   blockage small intestines   GIVENS CAPSULE STUDY N/A 05/11/2016   Procedure: GIVENS CAPSULE STUDY;  Surgeon: Ruel Kung, MD;  Location: [facility];  Service: Endoscopy;  Laterality: N/A;   Patient Active Problem List   Diagnosis Date Noted   Carotid stenosis 10/22/2023   Long-term current use of benzodiazepine 08/23/2023   Lymphedema 03/29/2023   Anemia due to stage 4 chronic kidney disease (HCC) 02/04/2023   Overweight (BMI 25.0-29.9) 08/05/2022   Vision loss 08/04/2022   Senile purpura (HCC) 06/16/2022   Insomnia 06/16/2022   Varicose veins of both lower extremities without ulcer or inflammation 06/16/2022   Mixed hyperlipidemia 06/12/2022   Occipital neuralgia 06/12/2022   Sensory ataxia 06/12/2022   Osteopenia of left forearm 06/12/2022   CMC arthritis 05/23/2022   CKD (chronic kidney disease) stage 4, GFR 15-29 ml/min (HCC) 03/03/2022   Hypothyroidism, adult 03/01/2022   Loss of memory 07/26/2021   Moderate pulmonary hypertension (HCC) 05/20/2021   Primary  osteoarthritis involving multiple joints 03/24/2021   Unsteady gait 10/16/2019   History of stroke 03/04/2019   Bilateral foot pain 03/06/2018   Chronic bilateral low back pain 03/06/2018   Neck pain 03/06/2018   Neuropathy 02/06/2018   History of nonmelanoma skin cancer 09/27/2016   Essential hypertension 09/02/2016   Hiatal hernia    Stricture of esophagus    Iron  deficiency anemia, unspecified 03/23/2016   History of tachycardia 07/29/2015   PCP: [name], NP  REFERRING PROVIDER: Lynwood Hue, MD  REFERRING DIAG: Rt knee pain  THERAPY DIAG:   Abnormality of gait and mobility  Acute pain of right knee  Difficulty in walking, not elsewhere classified  Muscle weakness (generalized)  Unsteadiness on feet  Other lack of coordination  Rationale for Evaluation and Treatment: Rehabilitation  ONSET DATE: June 2025  SUBJECTIVE:  SUBJECTIVE STATEMENT:  Patient reports continuing to feel some better and walking more without her cane.   PERTINENT HISTORY: 88yoF referred to OPPT for acute insidious Rt medial knee pain. Pt seen by Dr. Hue [facility], per pt he recommended PT to gain muscle and strength in this knee, but generally the joint looked quite favorable. Unrelated, pt reports significant debility and difficulty walking without limiting DOE since being hospitalized last year- this has created more of activity restriction than her knee pain alone.   PAIN:  Are you having pain? No pain today; knee ache and significant back pain last night.   PRECAUTIONS: None  WEIGHT BEARING RESTRICTIONS: No  FALLS:  Has patient fallen in last 6 months? Yes. Number of falls 1  LIVING ENVIRONMENT: Lives with: lives with their family and lives with their son Stairs: No Has following equipment at home: Single point cane and Environmental consultant - 2 wheeled  OCCUPATION: retired   PLOF: drives, AMB with SPC more recently or walker last year when she left [employer], history of falls/neuropathy.   PATIENT GOALS: resolution of knee pain   OBJECTIVE:  Note: Objective measures were completed at evaluation unless otherwise noted.  DIAGNOSTIC FINDINGS:   Recently seen by orthopedics. PATIENT SURVEYS:  Attempted LEFS and KOOD, neither of which were compatible with pt's self-report capacity.  COGNITION: Overall cognitive status: globally WNL, mentation somewhat limiting to ability to response to LEFS/KOOS in a meaningful way.   EDEMA:  None seen upon visual inspection  PALPATION: Rt knee extensor mechanism; not tender, no focal or global  edema/bogginess, healthy muscle mass JOINT RANGE:   ROM Right eval Left eval  Knee flexion >110*   Knee extension full full   *pt reports not limited, flexoin at baseline  FUNCTIONAL TESTS:  5xSTS: 20sec, hands free, no LOB, mild antalgia reps 4-5  423ft AMB  overground: SPC use, 8 minutes, 38 sec (11/16/23)   GAIT: Distance walked: 22 meters Assistive device utilized: SPC  Level of assistance: ModI  Comments: slow, appears to be limited more by baseline balance deficits  TODAY'S TREATMENT 12/14/23 :      Physical therapy treatment session today consisted of completing assessment of goals and administration of testing as demonstrated and documented in flow sheet, treatment, and goals section of this note. Addition treatments may be found below.    Pt performed 5 time sit<>stand (5xSTS): 13.98 sec (>15 sec indicates increased fall risk)    6 Min Walk Test:  Instructed patient to ambulate as quickly and as safely as possible for 6 minutes using LRAD. Patient was allowed to take standing rest breaks without stopping the test, but if the patient required a sitting rest break the clock would be stopped and the test would be over.  Results: 630 feet (192 meters, Avg speed 0.53 m/s) using No AD with CGA. Results indicate that the patient has reduced endurance with ambulation compared to age matched norms.  Age Matched Norms: 10-69 yo M: 36 F: 30, 74-79 yo M: 68 F: 471, 76-89 yo M: 417 F: 392 MDC: 58.21 meters (190.98 feet) or 50 meters (ANPTA Core Set of Outcome Measures for Adults with Neurologic Conditions, 2018)   Item Test date: 12/14/2023 Test date:  Test date:   Sitting to standing 4. able to stand without using hands and stabilize independently Insert OPRCBERGREEVAL SmartPhrase at re-test date Insert OPRCBERGREEVAL SmartPhrase at re-test date  2. Standing  unsupported 4. able to stand safely for 2 minutes    3. Sitting with back unsupported, feet supported 4. able to sit safely and securely for 2 minutes    4. Standing to sitting 4. sits safely with minimal use of hands    5. Pivot transfer  4. able to transfer safely with minor use of hands    6. Standing unsupported with eyes closed 4. able to stand 10 seconds safely    7. Standing unsupported with feet together 4. able to place feet together independently and stand 1 minute safely    8. Reaching forward with outstretched arms while standing 3. can reach forward 12 cm (5 inches)    9. Pick up object from the floor from standing 4. able to pick up slipper safely and easily    10. Turning to look behind over left and right shoulders while standing 2. turns sideways but only maintains balance    11. Turn 360 degrees 4. able to turn 360 degrees safely in 4 seconds or less    12. Place alternate foot on step or stool while standing unsupported 4. ble to stand independently and safely and complete 8 steps in 20 seconds    13. Standing unsupported one foot in front 3. able to place foot ahead independently and hold 30 seconds    14. Standing on one leg 2. able to lift leg independently and hold >= 3 seconds      Total Score 50/56 Total Score /56 Total Score /56     10 Meter Walk Test: Patient instructed to walk 10 meters (32.8 ft) as quickly and as safely as possible at their normal speed x2 and at a fast speed x2. Time measured from 2 meter mark to 8 meter mark to accommodate ramp-up and ramp-down.  Normal speed 1: 0.54 m/s Normal speed 2: 0.56 m/s Average Normal speed: 0.55 m/s Cut off scores: <0.4 m/s = household Ambulator, 0.4-0.8 m/s = limited community Ambulator, >0.8 m/s = community Ambulator, >1.2 m/s = crossing a street, <1.0 = increased fall risk MCID 0.05 m/s (small), 0.13 m/s (moderate), 0.06 m/s (significant)  (ANPTA Core  Set of Outcome Measures for Adults with Neurologic Conditions,  2018)   TE- To improve strength, endurance, mobility, and function of specific targeted muscle groups or improve joint range of motion or improve muscle flexibility/TA- To improve functional movements patterns for everyday tasks   Seated LAQ x 15 reps ea LE  Seated march x 15 ea LE      PATIENT EDUCATION:  Education details: Pt educated throughout session about proper posture and technique with exercises. Improved exercise technique, movement at target joints, use of target muscles after min to mod verbal, visual, tactile cues. Person educated: Pt  Education method: collaborative learning, deliberate practice, positive reinforcement, explicit instruction, establish rules. Education comprehension: seems ok; FU in future to determine LT comprehension/adherence   Benefits of ergonomic handle on cane for hand pain management at metacarpals.   HOME EXERCISE PROGRAM: Access Code: [email or web address] URL: [email or web address] Date: 11/14/2023 Prepared by: Peggye Linear  Exercises - Supine Knee Extension Strengthening  - 2 x daily - 1 sets - 15 reps - Supine Bridge  - 2 x daily - 1 sets - 10 reps (low range as not to aggravate chronic low back pain)  - Seated Long Arc Quad  - 2 x daily - 1 sets - 10 reps - Heel Raises with Counter Support  - 2 x daily - 1 sets - 15 reps  ASSESSMENT:  CLINICAL IMPRESSION: Patient arrived in good spirits and agreeable to testing for progress note visit today. Patient presents with improved overall LE strength as seen by improved 5x STS and 6 min walk- now ambulating without an AD.  Added a balance and gait speed goal to add focus toward more dynamic balance and functional mobility. Patient's condition has the potential to improve in response to therapy. Maximum improvement is yet to be obtained. The anticipated improvement is attainable and reasonable in a generally predictable time. Pt will continue to benefit from skilled physical therapy intervention  to address impairments, improve QOL, and attain therapy goals.       OBJECTIVE IMPAIRMENTS: Decreased knowledge of condition, decreased use of DME, decreased mobility, difficulty walking, decreased strength.  ACTIVITY LIMITATIONS: Lifting, standing, walking, squatting, transfers, locomotion level PARTICIPATION LIMITATIONS: Cleaning, laundry, interpersonal relationships, driving, yardwork, community activity.  PERSONAL FACTORS: Age, behavior pattern, education, past/current experiences, transportation, profession  are also affecting patient's functional outcome.  REHAB POTENTIAL: Good.  CLINICAL DECISION MAKING: moderate  EVALUATION COMPLEXITY: Stable/uncomplicated   GOALS: Goals reviewed with patient? No  SHORT TERM GOALS: Target date: 12/15/23 Patient will report comprehension, confidence, and consistent compliance and of a simple home exercise program established to facilitate symptoms management and basic strengthening and/or segment mobility.  Baseline: issued at visit 1; 12/14/2023=Patient reports walking outside and doing most of the exercisses Goal status: MET  2.  Pt to demonstrate improvement in transfers power AEB 5xSTS<17seconds. Baseline: 5XSTS 20sec on visit 1; 12/14/2023: 13.98 sec without UE support Goal status: INITIAL  LONG TERM GOALS: Target date: 01/15/24 Pt to demonstrate ability to perform without pain limitation, less than 2/10 increase in NPRS, and with >10% improvement in distance to improve ability to participate in IADL and social events.   Baseline: pending assessment visit 2 ; 11/20/2023=440 feet with SPC and 550 feet with 4WW; 12/14/2023= 630 feet without UE support with report of only minimal low back pain reported during last 30 sec of walk.  Goal status: Progressing  2.  Patient to demonstrate improved performance on initial transfers assessment AEB improved reps per time  or time per perform desired reps and/or reduced seat height and/or use of hands in  order to improve safety, tolerance, and independence in ADL performance.   Baseline: visit 1 5xSTS: 20sec; 12/14/2023= 13.98 sec without UE support  Goal status: PROGRESSING  3.  Pt to report ability to perform housework an dlighthousehold activities ad lib without painful restrictions to these activities.  Baseline: limited by pain at eval; 12/14/2023= Patient reports doing household chores with some intermittent rest breaks- including washing clothes, dishes, sweeping, vacuuming.  Goal status: PROGRESSING  4. Pt will improve BERG by at least 3 points in order to demonstrate clinically significant improvement in balance.  Baseline: 12/14/2023= 50/56  Goal status: NEW  5.  Patient will improve with gait speed as seen by improved 10 MWT > 0.7 m/s with no device vs. Cane for improved mobility and decreased risk of falling.   PLAN:  PT FREQUENCY: 1-2x/week  PT DURATION: 8 weeks  PLANNED INTERVENTIONS: 97110-Therapeutic exercises, 97530- Therapeutic activity, 97112- Neuromuscular re-education, 97535- Self Care, [id number]- Manual therapy, [id number]- Electrical stimulation (unattended), [phone]- Electrical stimulation (manual), Patient/Family education, Balance training, Stair training, Joint mobilization, Joint manipulation, Visual/preceptual remediation/compensation, DME instructions, Cryotherapy, and Moist heat  PLAN FOR NEXT SESSION:   Progressive balance interventions Progress LE strengthening  Progress gait speed activities.   12:31 PM, 12/14/23   Reyes LOISE London, PT 12/14/2023, 12:31 PM

## 2023-12-18 ENCOUNTER — Encounter: Admitting: Physical Therapy

## 2023-12-19 ENCOUNTER — Ambulatory Visit: Attending: Orthopedic Surgery | Admitting: Physical Therapy

## 2023-12-19 DIAGNOSIS — R262 Difficulty in walking, not elsewhere classified: Secondary | ICD-10-CM | POA: Insufficient documentation

## 2023-12-19 DIAGNOSIS — M6281 Muscle weakness (generalized): Secondary | ICD-10-CM | POA: Insufficient documentation

## 2023-12-19 DIAGNOSIS — R269 Unspecified abnormalities of gait and mobility: Secondary | ICD-10-CM | POA: Insufficient documentation

## 2023-12-19 DIAGNOSIS — R278 Other lack of coordination: Secondary | ICD-10-CM | POA: Diagnosis not present

## 2023-12-19 DIAGNOSIS — M25561 Pain in right knee: Secondary | ICD-10-CM | POA: Diagnosis not present

## 2023-12-19 DIAGNOSIS — R2681 Unsteadiness on feet: Secondary | ICD-10-CM | POA: Diagnosis not present

## 2023-12-19 NOTE — Therapy (Signed)
 OUTPATIENT PHYSICAL THERAPY TREATMENT  Patient Name: Tracie Fisher MRN: [id number] DOB:31-Oct-1935, 88 y.o., female Today's Date: 12/19/2023  END OF SESSION:  PT End of Session - 12/19/23 1131     Visit Number 11    Number of Visits 16    Date for PT Re-Evaluation 01/09/24    Authorization Type [employer]    Authorization Time Period 11/14/23-01/09/24    Progress Note Due on Visit 20    PT Start Time 1145    PT Stop Time 1225    PT Time Calculation (min) 40 min    Equipment Utilized During Treatment Gait belt    Activity Tolerance Patient tolerated treatment well;No increased pain    Behavior During Therapy WFL for tasks assessed/performed                Past Medical History:  Diagnosis Date   Cancer (HCC)    SKIN   Complication of anesthesia    Depression    Dysrhythmia    GERD (gastroesophageal reflux disease)    Hiatal hernia    HOH (hard of hearing)    Hypertension    Hypothyroidism    PONV (postoperative nausea and vomiting)    Thyroid  disease    Past Surgical History:  Procedure Laterality Date   APPENDECTOMY     BACK SURGERY     MULTIPLE/ROD IN BACK   BREAST CYST ASPIRATION Left years ago   CATARACT EXTRACTION W/PHACO Left 03/21/2017   Procedure: CATARACT EXTRACTION PHACO AND INTRAOCULAR LENS PLACEMENT (IOC);  Surgeon: Jaye Fallow, MD;  Location: [facility];  Service: Ophthalmology;  Laterality: Left;  US  00:48 AP% 17.8 CDE 8.57 Fluid pack lot # [id number]   CATARACT EXTRACTION W/PHACO Right 04/11/2017   Procedure: CATARACT EXTRACTION PHACO AND INTRAOCULAR LENS PLACEMENT (IOC);  Surgeon: Jaye Fallow, MD;  Location: [facility];  Service: Ophthalmology;  Laterality: Right;  US  01:09.5 AP% 15.8 CDE 11.04 Fluid Pack lot # [id number]   CHOLECYSTECTOMY     COLON SURGERY     BOWEL OBSTRUCTION   COLONOSCOPY WITH PROPOFOL  N/A 04/25/2016   Procedure: COLONOSCOPY WITH PROPOFOL ;  Surgeon: Ruel Kung, MD;  Location: [facility];  Service: Endoscopy;   Laterality: N/A;   ESOPHAGOGASTRODUODENOSCOPY (EGD) WITH PROPOFOL  N/A 04/25/2016   Procedure: ESOPHAGOGASTRODUODENOSCOPY (EGD) WITH PROPOFOL ;  Surgeon: Ruel Kung, MD;  Location: [facility];  Service: Endoscopy;  Laterality: N/A;   EXPLORATORY LAPAROTOMY  10/18/2011   blockage small intestines   GIVENS CAPSULE STUDY N/A 05/11/2016   Procedure: GIVENS CAPSULE STUDY;  Surgeon: Ruel Kung, MD;  Location: [facility];  Service: Endoscopy;  Laterality: N/A;   Patient Active Problem List   Diagnosis Date Noted   Carotid stenosis 10/22/2023   Long-term current use of benzodiazepine 08/23/2023   Lymphedema 03/29/2023   Anemia due to stage 4 chronic kidney disease (HCC) 02/04/2023   Overweight (BMI 25.0-29.9) 08/05/2022   Vision loss 08/04/2022   Senile purpura (HCC) 06/16/2022   Insomnia 06/16/2022   Varicose veins of both lower extremities without ulcer or inflammation 06/16/2022   Mixed hyperlipidemia 06/12/2022   Occipital neuralgia 06/12/2022   Sensory ataxia 06/12/2022   Osteopenia of left forearm 06/12/2022   CMC arthritis 05/23/2022   CKD (chronic kidney disease) stage 4, GFR 15-29 ml/min (HCC) 03/03/2022   Hypothyroidism, adult 03/01/2022   Loss of memory 07/26/2021   Moderate pulmonary hypertension (HCC) 05/20/2021   Primary osteoarthritis involving multiple joints 03/24/2021   Unsteady gait 10/16/2019   History of stroke 03/04/2019  Bilateral foot pain 03/06/2018   Chronic bilateral low back pain 03/06/2018   Neck pain 03/06/2018   Neuropathy 02/06/2018   History of nonmelanoma skin cancer 09/27/2016   Essential hypertension 09/02/2016   Hiatal hernia    Stricture of esophagus    Iron  deficiency anemia, unspecified 03/23/2016   History of tachycardia 07/29/2015   PCP: [name], NP  REFERRING PROVIDER: Lynwood Hue, MD  REFERRING DIAG: Rt knee pain  THERAPY DIAG:  Abnormality of gait and mobility  Acute pain of right knee  Difficulty in walking, not  elsewhere classified  Muscle weakness (generalized)  Unsteadiness on feet  Rationale for Evaluation and Treatment: Rehabilitation  ONSET DATE: June 2025  SUBJECTIVE:  SUBJECTIVE STATEMENT:  Patient reports continuing to feel some better and walking more without her cane. Been working on [employer].   PERTINENT HISTORY: 88yoF referred to OPPT for acute insidious Rt medial knee pain. Pt seen by Dr. Hue [facility], per pt he recommended PT to gain muscle and strength in this knee, but generally the joint looked quite favorable. Unrelated, pt reports significant debility and difficulty walking without limiting DOE since being hospitalized last year- this has created more of activity restriction than her knee pain alone.   PAIN:  Are you having pain? No pain today; knee ache and significant back pain last night.   PRECAUTIONS: None  WEIGHT BEARING RESTRICTIONS: No  FALLS:  Has patient fallen in last 6 months? Yes. Number of falls 1  LIVING ENVIRONMENT: Lives with: lives with their family and lives with their son Stairs: No Has following equipment at home: Single point cane and Environmental consultant - 2 wheeled  OCCUPATION: retired   PLOF: drives, AMB with SPC more recently or walker last year when she left [employer], history of falls/neuropathy.   PATIENT GOALS: resolution of knee pain   OBJECTIVE:  Note: Objective measures were completed at evaluation unless otherwise noted.  DIAGNOSTIC FINDINGS:   Recently seen by orthopedics. PATIENT SURVEYS:  Attempted LEFS and KOOD, neither of which were compatible with pt's self-report capacity.  COGNITION: Overall cognitive status: globally WNL, mentation somewhat limiting to ability to response to LEFS/KOOS in a meaningful way.   EDEMA:  None seen upon visual inspection  PALPATION: Rt knee extensor mechanism; not tender, no focal or global edema/bogginess, healthy muscle mass JOINT RANGE:   ROM Right eval Left eval  Knee flexion >110*   Knee  extension full full   *pt reports not limited, flexoin at baseline  FUNCTIONAL TESTS:  5xSTS: 20sec, hands free, no LOB, mild antalgia reps 4-5  428ft AMB  overground: SPC use, 8 minutes, 38 sec (11/16/23)   GAIT: Distance walked: 22 meters Assistive device utilized: SPC  Level of assistance: ModI  Comments: slow, appears to be limited more by baseline balance deficits  TODAY'S TREATMENT 12/19/23 :    TA- To improve functional movements patterns for everyday tasks  STS x 6 Gait without assistance x 165 ft  STS x 6 Gait without assistance x 165 ft   Standing hip march with 3# AW x 20 ea LE   Standing hip abduction 2 x 10 ea LE   NMR: To facilitate reeducation of movement, balance, posture, coordination, and/or proprioception/kinesthetic sense.  Airex ant step on / off x 10  Airex lateral step on / off x 10 ea side   Progressive balance interventions Progress LE strengthening  Progress gait speed activities.   TE- To improve strength, endurance, mobility, and function of specific targeted muscle groups or improve joint range of motion or improve muscle flexibility/TA- To improve functional movements patterns for everyday tasks   Seated LAQ 2 x 10 reps ea LE 3#AW      PATIENT EDUCATION:  Education details: Pt educated throughout session about proper posture and technique with exercises. Improved exercise technique, movement at target joints, use of target muscles after min to mod verbal, visual, tactile cues. Person educated: Pt  Education method: collaborative learning, deliberate practice, positive reinforcement, explicit instruction, establish rules. Education comprehension: seems ok; FU in future to determine LT comprehension/adherence   Benefits of ergonomic handle on cane for hand pain management at metacarpals.   HOME EXERCISE  PROGRAM: Access Code: [email or web address] URL: [email or web address] Date: 11/14/2023 Prepared by: Peggye Linear  Exercises - Supine Knee Extension Strengthening  - 2 x daily - 1 sets - 15 reps - Supine Bridge  - 2 x daily - 1 sets - 10 reps (low range as not to aggravate chronic low back pain)  - Seated Long Arc Quad  - 2 x daily - 1 sets - 10 reps - Heel Raises with Counter Support  - 2 x daily - 1 sets - 15 reps  ASSESSMENT:  CLINICAL IMPRESSION:  Continued with current plan of care as laid out in evaluation and recent prior sessions. Pt remains motivated to advance progress toward goals in order to maximize independence and safety at home. Pt requires high level assistance and cuing for completion of exercises in order to provide adequate level of stimulation and perturbation. Author allows pt as much opportunity as possible to perform independent righting strategies, only stepping in when pt is unable to prevent falling to floor. Pt closely monitored throughout session for safe vitals response and to maximize patient safety during interventions. Pt continues to demonstrate progress toward goals AEB progression of some interventions this date either in volume or intensity.      OBJECTIVE IMPAIRMENTS: Decreased knowledge of condition, decreased use of DME, decreased mobility, difficulty walking, decreased strength.  ACTIVITY LIMITATIONS: Lifting, standing, walking, squatting, transfers, locomotion level PARTICIPATION LIMITATIONS: Cleaning, laundry, interpersonal relationships, driving, yardwork, community activity.  PERSONAL FACTORS: Age, behavior pattern, education, past/current experiences, transportation, profession  are also affecting patient's functional outcome.  REHAB POTENTIAL: Good.  CLINICAL DECISION MAKING: moderate  EVALUATION COMPLEXITY: Stable/uncomplicated   GOALS: Goals reviewed with patient? No  SHORT TERM GOALS: Target date: 12/15/23 Patient will report  comprehension, confidence, and consistent compliance and of a simple home exercise program established to facilitate symptoms management and basic strengthening and/or segment mobility.  Baseline: issued at visit 1; 12/14/2023=Patient reports walking outside and doing most of the exercisses Goal status: MET  2.  Pt to demonstrate improvement in transfers power AEB 5xSTS<17seconds. Baseline: 5XSTS 20sec on visit 1; 12/14/2023: 13.98 sec without UE support Goal  status: INITIAL  LONG TERM GOALS: Target date: 01/15/24 Pt to demonstrate ability to perform without pain limitation, less than 2/10 increase in NPRS, and with >10% improvement in distance to improve ability to participate in IADL and social events.   Baseline: pending assessment visit 2 ; 11/20/2023=440 feet with SPC and 550 feet with 4WW; 12/14/2023= 630 feet without UE support with report of only minimal low back pain reported during last 30 sec of walk.  Goal status: Progressing  2.  Patient to demonstrate improved performance on initial transfers assessment AEB improved reps per time or time per perform desired reps and/or reduced seat height and/or use of hands in order to improve safety, tolerance, and independence in ADL performance.   Baseline: visit 1 5xSTS: 20sec; 12/14/2023= 13.98 sec without UE support  Goal status: PROGRESSING  3.  Pt to report ability to perform housework an dlighthousehold activities ad lib without painful restrictions to these activities.  Baseline: limited by pain at eval; 12/14/2023= Patient reports doing household chores with some intermittent rest breaks- including washing clothes, dishes, sweeping, vacuuming.  Goal status: PROGRESSING  4. Pt will improve BERG by at least 3 points in order to demonstrate clinically significant improvement in balance.  Baseline: 12/14/2023= 50/56  Goal status: NEW  5.  Patient will improve with gait speed as seen by improved 10 MWT > 0.7 m/s with no device vs. Cane for  improved mobility and decreased risk of falling.   PLAN:  PT FREQUENCY: 1-2x/week  PT DURATION: 8 weeks  PLANNED INTERVENTIONS: 97110-Therapeutic exercises, 97530- Therapeutic activity, 97112- Neuromuscular re-education, 97535- Self Care, [id number]- Manual therapy, [id number]- Electrical stimulation (unattended), [phone]- Electrical stimulation (manual), Patient/Family education, Balance training, Stair training, Joint mobilization, Joint manipulation, Visual/preceptual remediation/compensation, DME instructions, Cryotherapy, and Moist heat  PLAN FOR NEXT SESSION:   Progressive balance interventions Progress LE strengthening  Progress gait speed activities.   11:32 AM, 12/19/23   Lonni KATHEE Gainer, PT 12/19/2023, 11:32 AM

## 2023-12-20 ENCOUNTER — Encounter: Admitting: Physical Therapy

## 2023-12-21 ENCOUNTER — Ambulatory Visit: Admitting: Physical Therapy

## 2023-12-21 DIAGNOSIS — M6281 Muscle weakness (generalized): Secondary | ICD-10-CM

## 2023-12-21 DIAGNOSIS — R2681 Unsteadiness on feet: Secondary | ICD-10-CM

## 2023-12-21 DIAGNOSIS — R269 Unspecified abnormalities of gait and mobility: Secondary | ICD-10-CM | POA: Diagnosis not present

## 2023-12-21 DIAGNOSIS — M25561 Pain in right knee: Secondary | ICD-10-CM | POA: Diagnosis not present

## 2023-12-21 DIAGNOSIS — R278 Other lack of coordination: Secondary | ICD-10-CM | POA: Diagnosis not present

## 2023-12-21 DIAGNOSIS — R262 Difficulty in walking, not elsewhere classified: Secondary | ICD-10-CM | POA: Diagnosis not present

## 2023-12-21 NOTE — Therapy (Signed)
 OUTPATIENT PHYSICAL THERAPY TREATMENT  Patient Name: Tracie Fisher MRN: [id number] DOB:1936-02-28, 88 y.o., female Today's Date: 12/21/2023  END OF SESSION:  PT End of Session - 12/21/23 0945     Visit Number 12    Number of Visits 16    Date for PT Re-Evaluation 01/09/24    Authorization Type [employer]    Authorization Time Period 11/14/23-01/09/24    Progress Note Due on Visit 20    PT Start Time 0950    PT Stop Time 1030    PT Time Calculation (min) 40 min    Equipment Utilized During Treatment Gait belt    Activity Tolerance Patient tolerated treatment well;No increased pain    Behavior During Therapy WFL for tasks assessed/performed                 Past Medical History:  Diagnosis Date   Cancer (HCC)    SKIN   Complication of anesthesia    Depression    Dysrhythmia    GERD (gastroesophageal reflux disease)    Hiatal hernia    HOH (hard of hearing)    Hypertension    Hypothyroidism    PONV (postoperative nausea and vomiting)    Thyroid  disease    Past Surgical History:  Procedure Laterality Date   APPENDECTOMY     BACK SURGERY     MULTIPLE/ROD IN BACK   BREAST CYST ASPIRATION Left years ago   CATARACT EXTRACTION W/PHACO Left 03/21/2017   Procedure: CATARACT EXTRACTION PHACO AND INTRAOCULAR LENS PLACEMENT (IOC);  Surgeon: Jaye Fallow, MD;  Location: [facility];  Service: Ophthalmology;  Laterality: Left;  US  00:48 AP% 17.8 CDE 8.57 Fluid pack lot # [id number]   CATARACT EXTRACTION W/PHACO Right 04/11/2017   Procedure: CATARACT EXTRACTION PHACO AND INTRAOCULAR LENS PLACEMENT (IOC);  Surgeon: Jaye Fallow, MD;  Location: [facility];  Service: Ophthalmology;  Laterality: Right;  US  01:09.5 AP% 15.8 CDE 11.04 Fluid Pack lot # [id number]   CHOLECYSTECTOMY     COLON SURGERY     BOWEL OBSTRUCTION   COLONOSCOPY WITH PROPOFOL  N/A 04/25/2016   Procedure: COLONOSCOPY WITH PROPOFOL ;  Surgeon: Ruel Kung, MD;  Location: [facility];  Service:  Endoscopy;  Laterality: N/A;   ESOPHAGOGASTRODUODENOSCOPY (EGD) WITH PROPOFOL  N/A 04/25/2016   Procedure: ESOPHAGOGASTRODUODENOSCOPY (EGD) WITH PROPOFOL ;  Surgeon: Ruel Kung, MD;  Location: [facility];  Service: Endoscopy;  Laterality: N/A;   EXPLORATORY LAPAROTOMY  10/18/2011   blockage small intestines   GIVENS CAPSULE STUDY N/A 05/11/2016   Procedure: GIVENS CAPSULE STUDY;  Surgeon: Ruel Kung, MD;  Location: [facility];  Service: Endoscopy;  Laterality: N/A;   Patient Active Problem List   Diagnosis Date Noted   Carotid stenosis 10/22/2023   Long-term current use of benzodiazepine 08/23/2023   Lymphedema 03/29/2023   Anemia due to stage 4 chronic kidney disease (HCC) 02/04/2023   Overweight (BMI 25.0-29.9) 08/05/2022   Vision loss 08/04/2022   Senile purpura (HCC) 06/16/2022   Insomnia 06/16/2022   Varicose veins of both lower extremities without ulcer or inflammation 06/16/2022   Mixed hyperlipidemia 06/12/2022   Occipital neuralgia 06/12/2022   Sensory ataxia 06/12/2022   Osteopenia of left forearm 06/12/2022   CMC arthritis 05/23/2022   CKD (chronic kidney disease) stage 4, GFR 15-29 ml/min (HCC) 03/03/2022   Hypothyroidism, adult 03/01/2022   Loss of memory 07/26/2021   Moderate pulmonary hypertension (HCC) 05/20/2021   Primary osteoarthritis involving multiple joints 03/24/2021   Unsteady gait 10/16/2019   History of stroke 03/04/2019  Bilateral foot pain 03/06/2018   Chronic bilateral low back pain 03/06/2018   Neck pain 03/06/2018   Neuropathy 02/06/2018   History of nonmelanoma skin cancer 09/27/2016   Essential hypertension 09/02/2016   Hiatal hernia    Stricture of esophagus    Iron  deficiency anemia, unspecified 03/23/2016   History of tachycardia 07/29/2015   PCP: [name], NP  REFERRING PROVIDER: Lynwood Hue, MD  REFERRING DIAG: Rt knee pain  THERAPY DIAG:  Abnormality of gait and mobility  Acute pain of right knee  Difficulty in  walking, not elsewhere classified  Muscle weakness (generalized)  Unsteadiness on feet  Rationale for Evaluation and Treatment: Rehabilitation  ONSET DATE: June 2025  SUBJECTIVE:  SUBJECTIVE STATEMENT:  Patient reports continuing to feel some better and walking more without her cane. Been working on [employer].   PERTINENT HISTORY: 88yoF referred to OPPT for acute insidious Rt medial knee pain. Pt seen by Dr. Hue [facility], per pt he recommended PT to gain muscle and strength in this knee, but generally the joint looked quite favorable. Unrelated, pt reports significant debility and difficulty walking without limiting DOE since being hospitalized last year- this has created more of activity restriction than her knee pain alone.   PAIN:  Are you having pain? No pain today; knee ache and significant back pain last night.   PRECAUTIONS: None  WEIGHT BEARING RESTRICTIONS: No  FALLS:  Has patient fallen in last 6 months? Yes. Number of falls 1  LIVING ENVIRONMENT: Lives with: lives with their family and lives with their son Stairs: No Has following equipment at home: Single point cane and Environmental consultant - 2 wheeled  OCCUPATION: retired   PLOF: drives, AMB with SPC more recently or walker last year when she left [employer], history of falls/neuropathy.   PATIENT GOALS: resolution of knee pain   OBJECTIVE:  Note: Objective measures were completed at evaluation unless otherwise noted.  DIAGNOSTIC FINDINGS:   Recently seen by orthopedics. PATIENT SURVEYS:  Attempted LEFS and KOOD, neither of which were compatible with pt's self-report capacity.  COGNITION: Overall cognitive status: globally WNL, mentation somewhat limiting to ability to response to LEFS/KOOS in a meaningful way.   EDEMA:  None seen upon visual inspection  PALPATION: Rt knee extensor mechanism; not tender, no focal or global edema/bogginess, healthy muscle mass JOINT RANGE:   ROM Right eval Left eval  Knee flexion  >110*   Knee extension full full   *pt reports not limited, flexoin at baseline  FUNCTIONAL TESTS:  5xSTS: 20sec, hands free, no LOB, mild antalgia reps 4-5  439ft AMB  overground: SPC use, 8 minutes, 38 sec (11/16/23)   GAIT: Distance walked: 22 meters Assistive device utilized: SPC  Level of assistance: ModI  Comments: slow, appears to be limited more by baseline balance deficits  TODAY'S TREATMENT 12/21/23 :    TA- To improve functional movements patterns for everyday tasks  STS x 7 Gait without assistance x 320 ft  STS x 7 Gait without assistance x 320 ft   Sidestepping x 3 laps ( 10 ft)  *1 lap = 1 time each way to return to start point  Forward and retro gait x 3 laps ( 10 ft ea)  Standing hip march with 3# AW x 20 ea LE  Standing hip abduction 2 x 10 ea LE   NMR: To facilitate reeducation of movement, balance, posture, coordination, and/or proprioception/kinesthetic sense.  Airex ant step on / off x 10  Airex lateral step on / off x 10 ea side   TE- To improve strength, endurance, mobility, and function of specific targeted muscle groups or improve joint range of motion or improve muscle flexibility  Seated LAQ 2 x 10 reps ea LE 3#AW Seated march 2 x 10 ea LE       PATIENT EDUCATION:  Education details: Pt educated throughout session about proper posture and technique with exercises. Improved exercise technique, movement at target joints, use of target muscles after min to mod verbal, visual, tactile cues. Person educated: Pt  Education method: collaborative learning, deliberate practice, positive reinforcement, explicit instruction, establish rules. Education comprehension: seems ok; FU in future to determine LT comprehension/adherence   Benefits of ergonomic handle on cane for hand pain management at metacarpals.   HOME EXERCISE  PROGRAM: Access Code: [email or web address] URL: [email or web address] Date: 11/14/2023 Prepared by: Peggye Linear  Exercises - Supine Knee Extension Strengthening  - 2 x daily - 1 sets - 15 reps - Supine Bridge  - 2 x daily - 1 sets - 10 reps (low range as not to aggravate chronic low back pain)  - Seated Long Arc Quad  - 2 x daily - 1 sets - 10 reps - Heel Raises with Counter Support  - 2 x daily - 1 sets - 15 reps  ASSESSMENT:  CLINICAL IMPRESSION:  Continued with current plan of care as laid out in evaluation and recent prior sessions. Pt remains motivated to advance progress toward goals in order to maximize independence and safety at home. Pt requires high level assistance and cuing for completion of exercises in order to provide adequate level of stimulation and perturbation. Pt progressed with functional ambulation activities in multiple planes and also safely walked into clinic without cane this date. Pt closely monitored throughout session for safe vitals response and to maximize patient safety during interventions. Pt continues to demonstrate progress toward goals AEB progression of some interventions this date either in volume or intensity.      OBJECTIVE IMPAIRMENTS: Decreased knowledge of condition, decreased use of DME, decreased mobility, difficulty walking, decreased strength.  ACTIVITY LIMITATIONS: Lifting, standing, walking, squatting, transfers, locomotion level PARTICIPATION LIMITATIONS: Cleaning, laundry, interpersonal relationships, driving, yardwork, community activity.  PERSONAL FACTORS: Age, behavior pattern, education, past/current experiences, transportation, profession  are also affecting patient's functional outcome.  REHAB POTENTIAL: Good.  CLINICAL DECISION MAKING: moderate  EVALUATION COMPLEXITY: Stable/uncomplicated   GOALS: Goals reviewed with patient? No  SHORT TERM GOALS: Target date: 12/15/23 Patient will report comprehension, confidence, and  consistent compliance and of a simple home exercise program established to facilitate symptoms management and basic strengthening and/or segment mobility.  Baseline: issued at visit 1; 12/14/2023=Patient reports walking outside and doing most of the exercisses Goal status: MET  2.  Pt to demonstrate improvement in transfers power AEB 5xSTS<17seconds. Baseline:  5XSTS 20sec on visit 1; 12/14/2023: 13.98 sec without UE support Goal status: INITIAL  LONG TERM GOALS: Target date: 01/15/24 Pt to demonstrate ability to perform without pain limitation, less than 2/10 increase in NPRS, and with >10% improvement in distance to improve ability to participate in IADL and social events.   Baseline: pending assessment visit 2 ; 11/20/2023=440 feet with SPC and 550 feet with 4WW; 12/14/2023= 630 feet without UE support with report of only minimal low back pain reported during last 30 sec of walk.  Goal status: Progressing  2.  Patient to demonstrate improved performance on initial transfers assessment AEB improved reps per time or time per perform desired reps and/or reduced seat height and/or use of hands in order to improve safety, tolerance, and independence in ADL performance.   Baseline: visit 1 5xSTS: 20sec; 12/14/2023= 13.98 sec without UE support  Goal status: PROGRESSING  3.  Pt to report ability to perform housework an dlighthousehold activities ad lib without painful restrictions to these activities.  Baseline: limited by pain at eval; 12/14/2023= Patient reports doing household chores with some intermittent rest breaks- including washing clothes, dishes, sweeping, vacuuming.  Goal status: PROGRESSING  4. Pt will improve BERG by at least 3 points in order to demonstrate clinically significant improvement in balance.  Baseline: 12/14/2023= 50/56  Goal status: NEW  5.  Patient will improve with gait speed as seen by improved 10 MWT > 0.7 m/s with no device vs. Cane for improved mobility and decreased  risk of falling.   PLAN:  PT FREQUENCY: 1-2x/week  PT DURATION: 8 weeks  PLANNED INTERVENTIONS: 97110-Therapeutic exercises, 97530- Therapeutic activity, 97112- Neuromuscular re-education, 97535- Self Care, [id number]- Manual therapy, [id number]- Electrical stimulation (unattended), [phone]- Electrical stimulation (manual), Patient/Family education, Balance training, Stair training, Joint mobilization, Joint manipulation, Visual/preceptual remediation/compensation, DME instructions, Cryotherapy, and Moist heat  PLAN FOR NEXT SESSION:   Progressive balance interventions Progress LE strengthening  Progress gait speed activities.   10:05 AM, 12/21/23   Lonni KATHEE Gainer, PT 12/21/2023, 10:05 AM

## 2023-12-26 ENCOUNTER — Ambulatory Visit: Admitting: Physical Therapy

## 2023-12-26 DIAGNOSIS — R278 Other lack of coordination: Secondary | ICD-10-CM | POA: Diagnosis not present

## 2023-12-26 DIAGNOSIS — R262 Difficulty in walking, not elsewhere classified: Secondary | ICD-10-CM | POA: Diagnosis not present

## 2023-12-26 DIAGNOSIS — R269 Unspecified abnormalities of gait and mobility: Secondary | ICD-10-CM | POA: Diagnosis not present

## 2023-12-26 DIAGNOSIS — R2681 Unsteadiness on feet: Secondary | ICD-10-CM | POA: Diagnosis not present

## 2023-12-26 DIAGNOSIS — M6281 Muscle weakness (generalized): Secondary | ICD-10-CM | POA: Diagnosis not present

## 2023-12-26 DIAGNOSIS — M25561 Pain in right knee: Secondary | ICD-10-CM

## 2023-12-26 NOTE — Therapy (Signed)
 OUTPATIENT PHYSICAL THERAPY TREATMENT  Patient Name: Tracie Fisher MRN: [id number] DOB:06-22-1935, 88 y.o., female Today's Date: 12/26/2023  END OF SESSION:  PT End of Session - 12/26/23 1150     Visit Number 13    Number of Visits 16    Date for PT Re-Evaluation 01/09/24    Authorization Type [employer]    Authorization Time Period 11/14/23-01/09/24    Progress Note Due on Visit 20    PT Start Time 1142    PT Stop Time 1225    PT Time Calculation (min) 43 min    Equipment Utilized During Treatment Gait belt    Activity Tolerance Patient tolerated treatment well;No increased pain    Behavior During Therapy WFL for tasks assessed/performed                 Past Medical History:  Diagnosis Date   Cancer (HCC)    SKIN   Complication of anesthesia    Depression    Dysrhythmia    GERD (gastroesophageal reflux disease)    Hiatal hernia    HOH (hard of hearing)    Hypertension    Hypothyroidism    PONV (postoperative nausea and vomiting)    Thyroid  disease    Past Surgical History:  Procedure Laterality Date   APPENDECTOMY     BACK SURGERY     MULTIPLE/ROD IN BACK   BREAST CYST ASPIRATION Left years ago   CATARACT EXTRACTION W/PHACO Left 03/21/2017   Procedure: CATARACT EXTRACTION PHACO AND INTRAOCULAR LENS PLACEMENT (IOC);  Surgeon: Jaye Fallow, MD;  Location: [facility];  Service: Ophthalmology;  Laterality: Left;  US  00:48 AP% 17.8 CDE 8.57 Fluid pack lot # [id number]   CATARACT EXTRACTION W/PHACO Right 04/11/2017   Procedure: CATARACT EXTRACTION PHACO AND INTRAOCULAR LENS PLACEMENT (IOC);  Surgeon: Jaye Fallow, MD;  Location: [facility];  Service: Ophthalmology;  Laterality: Right;  US  01:09.5 AP% 15.8 CDE 11.04 Fluid Pack lot # [id number]   CHOLECYSTECTOMY     COLON SURGERY     BOWEL OBSTRUCTION   COLONOSCOPY WITH PROPOFOL  N/A 04/25/2016   Procedure: COLONOSCOPY WITH PROPOFOL ;  Surgeon: Ruel Kung, MD;  Location: [facility];  Service:  Endoscopy;  Laterality: N/A;   ESOPHAGOGASTRODUODENOSCOPY (EGD) WITH PROPOFOL  N/A 04/25/2016   Procedure: ESOPHAGOGASTRODUODENOSCOPY (EGD) WITH PROPOFOL ;  Surgeon: Ruel Kung, MD;  Location: [facility];  Service: Endoscopy;  Laterality: N/A;   EXPLORATORY LAPAROTOMY  10/18/2011   blockage small intestines   GIVENS CAPSULE STUDY N/A 05/11/2016   Procedure: GIVENS CAPSULE STUDY;  Surgeon: Ruel Kung, MD;  Location: [facility];  Service: Endoscopy;  Laterality: N/A;   Patient Active Problem List   Diagnosis Date Noted   Carotid stenosis 10/22/2023   Long-term current use of benzodiazepine 08/23/2023   Lymphedema 03/29/2023   Anemia due to stage 4 chronic kidney disease (HCC) 02/04/2023   Overweight (BMI 25.0-29.9) 08/05/2022   Vision loss 08/04/2022   Senile purpura (HCC) 06/16/2022   Insomnia 06/16/2022   Varicose veins of both lower extremities without ulcer or inflammation 06/16/2022   Mixed hyperlipidemia 06/12/2022   Occipital neuralgia 06/12/2022   Sensory ataxia 06/12/2022   Osteopenia of left forearm 06/12/2022   CMC arthritis 05/23/2022   CKD (chronic kidney disease) stage 4, GFR 15-29 ml/min (HCC) 03/03/2022   Hypothyroidism, adult 03/01/2022   Loss of memory 07/26/2021   Moderate pulmonary hypertension (HCC) 05/20/2021   Primary osteoarthritis involving multiple joints 03/24/2021   Unsteady gait 10/16/2019   History of stroke 03/04/2019  Bilateral foot pain 03/06/2018   Chronic bilateral low back pain 03/06/2018   Neck pain 03/06/2018   Neuropathy 02/06/2018   History of nonmelanoma skin cancer 09/27/2016   Essential hypertension 09/02/2016   Hiatal hernia    Stricture of esophagus    Iron  deficiency anemia, unspecified 03/23/2016   History of tachycardia 07/29/2015   PCP: [name], NP  REFERRING PROVIDER: Lynwood Hue, MD  REFERRING DIAG: Rt knee pain  THERAPY DIAG:  No diagnosis found.  Rationale for Evaluation and Treatment:  Rehabilitation  ONSET DATE: June 2025  SUBJECTIVE:  SUBJECTIVE STATEMENT:  Patient reports continuing to feel some better and walking more without her cane. Been working on [employer]. Unexciting weekend.   PERTINENT HISTORY: 88yoF referred to OPPT for acute insidious Rt medial knee pain. Pt seen by Dr. Hue [facility], per pt he recommended PT to gain muscle and strength in this knee, but generally the joint looked quite favorable. Unrelated, pt reports significant debility and difficulty walking without limiting DOE since being hospitalized last year- this has created more of activity restriction than her knee pain alone.   PAIN:  Are you having pain? No pain today; knee ache and significant back pain last night.   PRECAUTIONS: None  WEIGHT BEARING RESTRICTIONS: No  FALLS:  Has patient fallen in last 6 months? Yes. Number of falls 1  LIVING ENVIRONMENT: Lives with: lives with their family and lives with their son Stairs: No Has following equipment at home: Single point cane and Environmental consultant - 2 wheeled  OCCUPATION: retired   PLOF: drives, AMB with SPC more recently or walker last year when she left [employer], history of falls/neuropathy.   PATIENT GOALS: resolution of knee pain   OBJECTIVE:  Note: Objective measures were completed at evaluation unless otherwise noted.  DIAGNOSTIC FINDINGS:   Recently seen by orthopedics. PATIENT SURVEYS:  Attempted LEFS and KOOD, neither of which were compatible with pt's self-report capacity.  COGNITION: Overall cognitive status: globally WNL, mentation somewhat limiting to ability to response to LEFS/KOOS in a meaningful way.   EDEMA:  None seen upon visual inspection  PALPATION: Rt knee extensor mechanism; not tender, no focal or global edema/bogginess, healthy muscle mass JOINT RANGE:   ROM Right eval Left eval  Knee flexion >110*   Knee extension full full   *pt reports not limited, flexoin at baseline  FUNCTIONAL TESTS:  5xSTS:  20sec, hands free, no LOB, mild antalgia reps 4-5  443ft AMB  overground: SPC use, 8 minutes, 38 sec (11/16/23)   GAIT: Distance walked: 22 meters Assistive device utilized: SPC  Level of assistance: ModI  Comments: slow, appears to be limited more by baseline balance deficits                                                                                                                              TODAY'S TREATMENT 12/26/23 :    TA- To improve functional movements patterns  for everyday tasks  Nustep level 1-3 for B UE and LE reciprocal movement training STS x 8 Gait without assistance x 320 ft  STS x 8 Gait without assistance x 320 ft   Sidestepping x 3 laps ( 8 ft) with YTB around ankles   Heel raises 2 x 15   *1 lap = 1 time each way to return to start point  NMR: To facilitate reeducation of movement, balance, posture, coordination, and/or proprioception/kinesthetic sense.  Airex stance adducted stance x 30 sec  Airex march 2 x 10 ea LE  Walking over hurdles in // bars for foot clearance x 5 laps   TE- To improve strength, endurance, mobility, and function of specific targeted muscle groups or improve joint range of motion or improve muscle flexibility  Seated LAQ 2 x 15 reps ea LE 3#AW       PATIENT EDUCATION:  Education details: Pt educated throughout session about proper posture and technique with exercises. Improved exercise technique, movement at target joints, use of target muscles after min to mod verbal, visual, tactile cues. Person educated: Pt  Education method: collaborative learning, deliberate practice, positive reinforcement, explicit instruction, establish rules. Education comprehension: seems ok; FU in future to determine LT comprehension/adherence   Benefits of ergonomic handle on cane for hand pain management at metacarpals.   HOME EXERCISE PROGRAM: Access Code: [email or web address] URL: [email or web address] Date: 11/14/2023 Prepared by:  Peggye Linear  Exercises - Supine Knee Extension Strengthening  - 2 x daily - 1 sets - 15 reps - Supine Bridge  - 2 x daily - 1 sets - 10 reps (low range as not to aggravate chronic low back pain)  - Seated Long Arc Quad  - 2 x daily - 1 sets - 10 reps - Heel Raises with Counter Support  - 2 x daily - 1 sets - 15 reps  ASSESSMENT:  CLINICAL IMPRESSION:  Continued with current plan of care as laid out in evaluation and recent prior sessions. Pt remains motivated to advance progress toward goals in order to maximize independence and safety at home. Pt requires high level assistance and cuing for completion of exercises in order to provide adequate level of stimulation and perturbation. Pt progressed with functional ambulation activities in multiple planes and also safely walked into clinic without cane this date. Pt closely monitored throughout session for safe vitals response and to maximize patient safety during interventions. Pt still having coughing spells at times unrelated to illness, seeing cardiologist soon regarding issue.  Pt continues to demonstrate progress toward goals AEB progression of some interventions this date either in volume or intensity.      OBJECTIVE IMPAIRMENTS: Decreased knowledge of condition, decreased use of DME, decreased mobility, difficulty walking, decreased strength.  ACTIVITY LIMITATIONS: Lifting, standing, walking, squatting, transfers, locomotion level PARTICIPATION LIMITATIONS: Cleaning, laundry, interpersonal relationships, driving, yardwork, community activity.  PERSONAL FACTORS: Age, behavior pattern, education, past/current experiences, transportation, profession  are also affecting patient's functional outcome.  REHAB POTENTIAL: Good.  CLINICAL DECISION MAKING: moderate  EVALUATION COMPLEXITY: Stable/uncomplicated   GOALS: Goals reviewed with patient? No  SHORT TERM GOALS: Target date: 12/15/23 Patient will report comprehension, confidence, and  consistent compliance and of a simple home exercise program established to facilitate symptoms management and basic strengthening and/or segment mobility.  Baseline: issued at visit 1; 12/14/2023=Patient reports walking outside and doing most of the exercisses Goal status: MET  2.  Pt to demonstrate improvement in transfers power AEB 5xSTS<17seconds. Baseline: 5XSTS 20sec on visit 1;  12/14/2023: 13.98 sec without UE support Goal status: INITIAL  LONG TERM GOALS: Target date: 01/15/24 Pt to demonstrate ability to perform without pain limitation, less than 2/10 increase in NPRS, and with >10% improvement in distance to improve ability to participate in IADL and social events.   Baseline: pending assessment visit 2 ; 11/20/2023=440 feet with SPC and 550 feet with 4WW; 12/14/2023= 630 feet without UE support with report of only minimal low back pain reported during last 30 sec of walk.  Goal status: Progressing  2.  Patient to demonstrate improved performance on initial transfers assessment AEB improved reps per time or time per perform desired reps and/or reduced seat height and/or use of hands in order to improve safety, tolerance, and independence in ADL performance.   Baseline: visit 1 5xSTS: 20sec; 12/14/2023= 13.98 sec without UE support  Goal status: PROGRESSING  3.  Pt to report ability to perform housework an dlighthousehold activities ad lib without painful restrictions to these activities.  Baseline: limited by pain at eval; 12/14/2023= Patient reports doing household chores with some intermittent rest breaks- including washing clothes, dishes, sweeping, vacuuming.  Goal status: PROGRESSING  4. Pt will improve BERG by at least 3 points in order to demonstrate clinically significant improvement in balance.  Baseline: 12/14/2023= 50/56  Goal status: NEW  5.  Patient will improve with gait speed as seen by improved 10 MWT > 0.7 m/s with no device vs. Cane for improved mobility and decreased  risk of falling.   PLAN:  PT FREQUENCY: 1-2x/week  PT DURATION: 8 weeks  PLANNED INTERVENTIONS: 97110-Therapeutic exercises, 97530- Therapeutic activity, 97112- Neuromuscular re-education, 97535- Self Care, [id number]- Manual therapy, [id number]- Electrical stimulation (unattended), [phone]- Electrical stimulation (manual), Patient/Family education, Balance training, Stair training, Joint mobilization, Joint manipulation, Visual/preceptual remediation/compensation, DME instructions, Cryotherapy, and Moist heat  PLAN FOR NEXT SESSION:   Progressive balance interventions Progress LE strengthening  Progress gait speed activities.   11:50 AM, 12/26/23   Lonni KATHEE Gainer, PT 12/26/2023, 11:50 AM

## 2023-12-28 ENCOUNTER — Ambulatory Visit: Admitting: Physical Therapy

## 2023-12-28 DIAGNOSIS — M6281 Muscle weakness (generalized): Secondary | ICD-10-CM | POA: Diagnosis not present

## 2023-12-28 DIAGNOSIS — R2681 Unsteadiness on feet: Secondary | ICD-10-CM

## 2023-12-28 DIAGNOSIS — R262 Difficulty in walking, not elsewhere classified: Secondary | ICD-10-CM

## 2023-12-28 DIAGNOSIS — M25561 Pain in right knee: Secondary | ICD-10-CM

## 2023-12-28 DIAGNOSIS — R278 Other lack of coordination: Secondary | ICD-10-CM | POA: Diagnosis not present

## 2023-12-28 DIAGNOSIS — R269 Unspecified abnormalities of gait and mobility: Secondary | ICD-10-CM | POA: Diagnosis not present

## 2023-12-28 NOTE — Therapy (Signed)
 OUTPATIENT PHYSICAL THERAPY TREATMENT  Patient Name: Tracie Fisher MRN: [id number] DOB:[birth year], 88 y.o., female Today's Date: 12/28/2023  END OF SESSION:  PT End of Session - 12/28/23 1319     Visit Number 14    Number of Visits 16    Date for PT Re-Evaluation 01/09/24    Authorization Type [employer]    Authorization Time Period 11/14/23-01/09/24    Progress Note Due on Visit 20    PT Start Time 1318    PT Stop Time 1358    PT Time Calculation (min) 40 min    Equipment Utilized During Treatment Gait belt    Activity Tolerance Patient tolerated treatment well;No increased pain    Behavior During Therapy WFL for tasks assessed/performed                  Past Medical History:  Diagnosis Date   Cancer (HCC)    SKIN   Complication of anesthesia    Depression    Dysrhythmia    GERD (gastroesophageal reflux disease)    Hiatal hernia    HOH (hard of hearing)    Hypertension    Hypothyroidism    PONV (postoperative nausea and vomiting)    Thyroid  disease    Past Surgical History:  Procedure Laterality Date   APPENDECTOMY     BACK SURGERY     MULTIPLE/ROD IN BACK   BREAST CYST ASPIRATION Left years ago   CATARACT EXTRACTION W/PHACO Left 03/21/2017   Procedure: CATARACT EXTRACTION PHACO AND INTRAOCULAR LENS PLACEMENT (IOC);  Surgeon: Jaye Fallow, MD;  Location: [facility];  Service: Ophthalmology;  Laterality: Left;  US  00:48 AP% 17.8 CDE 8.57 Fluid pack lot # [id number]   CATARACT EXTRACTION W/PHACO Right 04/11/2017   Procedure: CATARACT EXTRACTION PHACO AND INTRAOCULAR LENS PLACEMENT (IOC);  Surgeon: Jaye Fallow, MD;  Location: [facility];  Service: Ophthalmology;  Laterality: Right;  US  01:09.5 AP% 15.8 CDE 11.04 Fluid Pack lot # [id number]   CHOLECYSTECTOMY     COLON SURGERY     BOWEL OBSTRUCTION   COLONOSCOPY WITH PROPOFOL  N/A 04/25/2016   Procedure: COLONOSCOPY WITH PROPOFOL ;  Surgeon: Ruel Kung, MD;  Location: [facility];  Service:  Endoscopy;  Laterality: N/A;   ESOPHAGOGASTRODUODENOSCOPY (EGD) WITH PROPOFOL  N/A 04/25/2016   Procedure: ESOPHAGOGASTRODUODENOSCOPY (EGD) WITH PROPOFOL ;  Surgeon: Ruel Kung, MD;  Location: [facility];  Service: Endoscopy;  Laterality: N/A;   EXPLORATORY LAPAROTOMY  10/18/2011   blockage small intestines   GIVENS CAPSULE STUDY N/A 05/11/2016   Procedure: GIVENS CAPSULE STUDY;  Surgeon: Ruel Kung, MD;  Location: [facility];  Service: Endoscopy;  Laterality: N/A;   Patient Active Problem List   Diagnosis Date Noted   Carotid stenosis 10/22/2023   Long-term current use of benzodiazepine 08/23/2023   Lymphedema 03/29/2023   Anemia due to stage 4 chronic kidney disease (HCC) 02/04/2023   Overweight (BMI 25.0-29.9) 08/05/2022   Vision loss 08/04/2022   Senile purpura (HCC) 06/16/2022   Insomnia 06/16/2022   Varicose veins of both lower extremities without ulcer or inflammation 06/16/2022   Mixed hyperlipidemia 06/12/2022   Occipital neuralgia 06/12/2022   Sensory ataxia 06/12/2022   Osteopenia of left forearm 06/12/2022   CMC arthritis 05/23/2022   CKD (chronic kidney disease) stage 4, GFR 15-29 ml/min (HCC) 03/03/2022   Hypothyroidism, adult 03/01/2022   Loss of memory 07/26/2021   Moderate pulmonary hypertension (HCC) 05/20/2021   Primary osteoarthritis involving multiple joints 03/24/2021   Unsteady gait 10/16/2019   History of stroke  03/04/2019   Bilateral foot pain 03/06/2018   Chronic bilateral low back pain 03/06/2018   Neck pain 03/06/2018   Neuropathy 02/06/2018   History of nonmelanoma skin cancer 09/27/2016   Essential hypertension 09/02/2016   Hiatal hernia    Stricture of esophagus    Iron  deficiency anemia, unspecified 03/23/2016   History of tachycardia 07/29/2015   PCP: [name], NP  REFERRING PROVIDER: Lynwood Hue, MD  REFERRING DIAG: Rt knee pain  THERAPY DIAG:  Abnormality of gait and mobility  Acute pain of right knee  Difficulty in  walking, not elsewhere classified  Muscle weakness (generalized)  Unsteadiness on feet  Rationale for Evaluation and Treatment: Rehabilitation  ONSET DATE: June 2025  SUBJECTIVE:  SUBJECTIVE STATEMENT:  Patient reports continuing to feel some better and walking more without her cane. Been working on [employer]. Off and on knee pain but nothing significant.   PERTINENT HISTORY: 88yoF referred to OPPT for acute insidious Rt medial knee pain. Pt seen by Dr. Hue [facility], per pt he recommended PT to gain muscle and strength in this knee, but generally the joint looked quite favorable. Unrelated, pt reports significant debility and difficulty walking without limiting DOE since being hospitalized last year- this has created more of activity restriction than her knee pain alone.   PAIN:  Are you having pain? No pain today; knee ache and significant back pain last night.   PRECAUTIONS: None  WEIGHT BEARING RESTRICTIONS: No  FALLS:  Has patient fallen in last 6 months? Yes. Number of falls 1  LIVING ENVIRONMENT: Lives with: lives with their family and lives with their son Stairs: No Has following equipment at home: Single point cane and Environmental consultant - 2 wheeled  OCCUPATION: retired   PLOF: drives, AMB with SPC more recently or walker last year when she left [employer], history of falls/neuropathy.   PATIENT GOALS: resolution of knee pain   OBJECTIVE:  Note: Objective measures were completed at evaluation unless otherwise noted.  DIAGNOSTIC FINDINGS:   Recently seen by orthopedics. PATIENT SURVEYS:  Attempted LEFS and KOOD, neither of which were compatible with pt's self-report capacity.  COGNITION: Overall cognitive status: globally WNL, mentation somewhat limiting to ability to response to LEFS/KOOS in a meaningful way.   EDEMA:  None seen upon visual inspection  PALPATION: Rt knee extensor mechanism; not tender, no focal or global edema/bogginess, healthy muscle mass JOINT  RANGE:   ROM Right eval Left eval  Knee flexion >110*   Knee extension full full   *pt reports not limited, flexoin at baseline  FUNCTIONAL TESTS:  5xSTS: 20sec, hands free, no LOB, mild antalgia reps 4-5  463ft AMB  overground: SPC use, 8 minutes, 38 sec (11/16/23)   GAIT: Distance walked: 22 meters Assistive device utilized: SPC  Level of assistance: ModI  Comments: slow, appears to be limited more by baseline balance deficits  TODAY'S TREATMENT 12/28/23 :    TA- To improve functional movements patterns for everyday tasks   Gait with 2# AW without assistance x 350 ft STS x 8 on airex pad ( min A on last few reps), good balance once in standing position Gait with 2# AW without assistance x 350 ft STS x 8  Gait with 2# AW without assistance x 350 ft  Heel raises 2 x 15 with 2# AW no UE support!  Step up x 10 ea LE on 6 in step without UE support ( close cga)   *1 lap = 1 time each way to return to start point  NMR: To facilitate reeducation of movement, balance, posture, coordination, and/or proprioception/kinesthetic sense.  Airex 3/4 romberg stance 2 x 45 sec ea LE  Airex march x 10 ea LE        PATIENT EDUCATION:  Education details: Pt educated throughout session about proper posture and technique with exercises. Improved exercise technique, movement at target joints, use of target muscles after min to mod verbal, visual, tactile cues. Person educated: Pt  Education method: collaborative learning, deliberate practice, positive reinforcement, explicit instruction, establish rules. Education comprehension: seems ok; FU in future to determine LT comprehension/adherence   Benefits of ergonomic handle on cane for hand pain management at metacarpals.   HOME EXERCISE PROGRAM: Access Code: [email or web address] URL: [email or web address] Date:  11/14/2023 Prepared by: Peggye Linear  Exercises - Supine Knee Extension Strengthening  - 2 x daily - 1 sets - 15 reps - Supine Bridge  - 2 x daily - 1 sets - 10 reps (low range as not to aggravate chronic low back pain)  - Seated Long Arc Quad  - 2 x daily - 1 sets - 10 reps - Heel Raises with Counter Support  - 2 x daily - 1 sets - 15 reps  ASSESSMENT:  CLINICAL IMPRESSION:  Continued with current plan of care as laid out in evaluation and recent prior sessions. Pt remains motivated to advance progress toward goals in order to maximize independence and safety at home. Pt progressing with resisted gait this date and with increased difficulty with STS with airex pad under feet providing balance and increased muscular strength challenges. Pt able to perform step ups to 6 in step without UE assist showing improved muscular force production and balance in Les. Pt still having coughing spells at times unrelated to illness, seeing cardiologist soon regarding issue.  Pt continues to demonstrate progress toward goals AEB progression of some interventions this date either in volume or intensity.      OBJECTIVE IMPAIRMENTS: Decreased knowledge of condition, decreased use of DME, decreased mobility, difficulty walking, decreased strength.  ACTIVITY LIMITATIONS: Lifting, standing, walking, squatting, transfers, locomotion level PARTICIPATION LIMITATIONS: Cleaning, laundry, interpersonal relationships, driving, yardwork, community activity.  PERSONAL FACTORS: Age, behavior pattern, education, past/current experiences, transportation, profession  are also affecting patient's functional outcome.  REHAB POTENTIAL: Good.  CLINICAL DECISION MAKING: moderate  EVALUATION COMPLEXITY: Stable/uncomplicated   GOALS: Goals reviewed with patient? No  SHORT TERM GOALS: Target date: 12/15/23 Patient will report comprehension, confidence, and consistent compliance and of a simple home exercise program established  to facilitate symptoms management and basic strengthening and/or segment mobility.  Baseline: issued at visit 1; 12/14/2023=Patient reports walking outside and doing most of the exercisses Goal status: MET  2.  Pt to demonstrate improvement in transfers power AEB 5xSTS<17seconds. Baseline: 5XSTS 20sec on visit 1; 12/14/2023: 13.98 sec without UE support Goal status: INITIAL  LONG  TERM GOALS: Target date: 01/15/24 Pt to demonstrate ability to perform without pain limitation, less than 2/10 increase in NPRS, and with >10% improvement in distance to improve ability to participate in IADL and social events.   Baseline: pending assessment visit 2 ; 11/20/2023=440 feet with SPC and 550 feet with 4WW; 12/14/2023= 630 feet without UE support with report of only minimal low back pain reported during last 30 sec of walk.  Goal status: Progressing  2.  Patient to demonstrate improved performance on initial transfers assessment AEB improved reps per time or time per perform desired reps and/or reduced seat height and/or use of hands in order to improve safety, tolerance, and independence in ADL performance.   Baseline: visit 1 5xSTS: 20sec; 12/14/2023= 13.98 sec without UE support  Goal status: PROGRESSING  3.  Pt to report ability to perform housework an dlighthousehold activities ad lib without painful restrictions to these activities.  Baseline: limited by pain at eval; 12/14/2023= Patient reports doing household chores with some intermittent rest breaks- including washing clothes, dishes, sweeping, vacuuming.  Goal status: PROGRESSING  4. Pt will improve BERG by at least 3 points in order to demonstrate clinically significant improvement in balance.  Baseline: 12/14/2023= 50/56  Goal status: NEW  5.  Patient will improve with gait speed as seen by improved 10 MWT > 0.7 m/s with no device vs. Cane for improved mobility and decreased risk of falling.   PLAN:  PT FREQUENCY: 1-2x/week  PT DURATION: 8  weeks  PLANNED INTERVENTIONS: 97110-Therapeutic exercises, 97530- Therapeutic activity, 97112- Neuromuscular re-education, 97535- Self Care, [id number]- Manual therapy, [id number]- Electrical stimulation (unattended), [phone]- Electrical stimulation (manual), Patient/Family education, Balance training, Stair training, Joint mobilization, Joint manipulation, Visual/preceptual remediation/compensation, DME instructions, Cryotherapy, and Moist heat  PLAN FOR NEXT SESSION:   Progressive balance interventions Progress LE strengthening  Progress gait speed activities.   1:20 PM, 12/28/23   Lonni KATHEE Gainer, PT 12/28/2023, 1:20 PM

## 2024-01-02 ENCOUNTER — Ambulatory Visit

## 2024-01-02 DIAGNOSIS — R269 Unspecified abnormalities of gait and mobility: Secondary | ICD-10-CM

## 2024-01-02 DIAGNOSIS — R278 Other lack of coordination: Secondary | ICD-10-CM

## 2024-01-02 DIAGNOSIS — M25561 Pain in right knee: Secondary | ICD-10-CM

## 2024-01-02 DIAGNOSIS — R262 Difficulty in walking, not elsewhere classified: Secondary | ICD-10-CM

## 2024-01-02 DIAGNOSIS — M6281 Muscle weakness (generalized): Secondary | ICD-10-CM | POA: Diagnosis not present

## 2024-01-02 DIAGNOSIS — R2681 Unsteadiness on feet: Secondary | ICD-10-CM

## 2024-01-02 NOTE — Therapy (Signed)
 OUTPATIENT PHYSICAL THERAPY TREATMENT  Patient Name: Tracie Fisher MRN: [id number] DOB:March 16, 1936, 88 y.o., female Today's Date: 01/03/2024  END OF SESSION:  PT End of Session - 01/02/24 1109     Visit Number 15    Number of Visits 16    Date for PT Re-Evaluation 01/09/24    Authorization Type [employer]    Authorization Time Period 11/14/23-01/09/24    Progress Note Due on Visit 20    PT Start Time 1103    PT Stop Time 1144    PT Time Calculation (min) 41 min    Equipment Utilized During Treatment Gait belt    Activity Tolerance Patient tolerated treatment well;No increased pain    Behavior During Therapy WFL for tasks assessed/performed                   Past Medical History:  Diagnosis Date   Cancer (HCC)    SKIN   Complication of anesthesia    Depression    Dysrhythmia    GERD (gastroesophageal reflux disease)    Hiatal hernia    HOH (hard of hearing)    Hypertension    Hypothyroidism    PONV (postoperative nausea and vomiting)    Thyroid  disease    Past Surgical History:  Procedure Laterality Date   APPENDECTOMY     BACK SURGERY     MULTIPLE/ROD IN BACK   BREAST CYST ASPIRATION Left years ago   CATARACT EXTRACTION W/PHACO Left 03/21/2017   Procedure: CATARACT EXTRACTION PHACO AND INTRAOCULAR LENS PLACEMENT (IOC);  Surgeon: Jaye Fallow, MD;  Location: [facility];  Service: Ophthalmology;  Laterality: Left;  US  00:48 AP% 17.8 CDE 8.57 Fluid pack lot # [id number]   CATARACT EXTRACTION W/PHACO Right 04/11/2017   Procedure: CATARACT EXTRACTION PHACO AND INTRAOCULAR LENS PLACEMENT (IOC);  Surgeon: Jaye Fallow, MD;  Location: [facility];  Service: Ophthalmology;  Laterality: Right;  US  01:09.5 AP% 15.8 CDE 11.04 Fluid Pack lot # [id number]   CHOLECYSTECTOMY     COLON SURGERY     BOWEL OBSTRUCTION   COLONOSCOPY WITH PROPOFOL  N/A 04/25/2016   Procedure: COLONOSCOPY WITH PROPOFOL ;  Surgeon: Ruel Kung, MD;  Location: [facility];  Service:  Endoscopy;  Laterality: N/A;   ESOPHAGOGASTRODUODENOSCOPY (EGD) WITH PROPOFOL  N/A 04/25/2016   Procedure: ESOPHAGOGASTRODUODENOSCOPY (EGD) WITH PROPOFOL ;  Surgeon: Ruel Kung, MD;  Location: [facility];  Service: Endoscopy;  Laterality: N/A;   EXPLORATORY LAPAROTOMY  10/18/2011   blockage small intestines   GIVENS CAPSULE STUDY N/A 05/11/2016   Procedure: GIVENS CAPSULE STUDY;  Surgeon: Ruel Kung, MD;  Location: [facility];  Service: Endoscopy;  Laterality: N/A;   Patient Active Problem List   Diagnosis Date Noted   Carotid stenosis 10/22/2023   Long-term current use of benzodiazepine 08/23/2023   Lymphedema 03/29/2023   Anemia due to stage 4 chronic kidney disease (HCC) 02/04/2023   Overweight (BMI 25.0-29.9) 08/05/2022   Vision loss 08/04/2022   Senile purpura (HCC) 06/16/2022   Insomnia 06/16/2022   Varicose veins of both lower extremities without ulcer or inflammation 06/16/2022   Mixed hyperlipidemia 06/12/2022   Occipital neuralgia 06/12/2022   Sensory ataxia 06/12/2022   Osteopenia of left forearm 06/12/2022   CMC arthritis 05/23/2022   CKD (chronic kidney disease) stage 4, GFR 15-29 ml/min (HCC) 03/03/2022   Hypothyroidism, adult 03/01/2022   Loss of memory 07/26/2021   Moderate pulmonary hypertension (HCC) 05/20/2021   Primary osteoarthritis involving multiple joints 03/24/2021   Unsteady gait 10/16/2019   History of  stroke 03/04/2019   Bilateral foot pain 03/06/2018   Chronic bilateral low back pain 03/06/2018   Neck pain 03/06/2018   Neuropathy 02/06/2018   History of nonmelanoma skin cancer 09/27/2016   Essential hypertension 09/02/2016   Hiatal hernia    Stricture of esophagus    Iron  deficiency anemia, unspecified 03/23/2016   History of tachycardia 07/29/2015   PCP: [name], NP  REFERRING PROVIDER: Lynwood Hue, MD  REFERRING DIAG: Rt knee pain  THERAPY DIAG:  Abnormality of gait and mobility  Acute pain of right knee  Difficulty in  walking, not elsewhere classified  Muscle weakness (generalized)  Unsteadiness on feet  Other lack of coordination  Rationale for Evaluation and Treatment: Rehabilitation  ONSET DATE: June 2025  SUBJECTIVE:  SUBJECTIVE STATEMENT:  Patient reports she brought her cane but has mostly been walking all week without it. Reports ongoing cough and some knee pain (right).    PERTINENT HISTORY: 88yoF referred to OPPT for acute insidious Rt medial knee pain. Pt seen by Dr. Hue [facility] orthopedics, per pt he recommended PT to gain muscle and strength in this knee, but generally the joint looked quite favorable. Unrelated, pt reports significant debility and difficulty walking without limiting DOE since being hospitalized last year- this has created more of activity restriction than her knee pain alone.   PAIN:  Are you having pain? No pain today; knee ache and significant back pain last night.   PRECAUTIONS: None  WEIGHT BEARING RESTRICTIONS: No  FALLS:  Has patient fallen in last 6 months? Yes. Number of falls 1  LIVING ENVIRONMENT: Lives with: lives with their family and lives with their son Stairs: No Has following equipment at home: Single point cane and Environmental consultant - 2 wheeled  OCCUPATION: retired   PLOF: drives, AMB with SPC more recently or walker last year when she left [facility], history of falls/neuropathy.   PATIENT GOALS: resolution of knee pain   OBJECTIVE:  Note: Objective measures were completed at evaluation unless otherwise noted.  DIAGNOSTIC FINDINGS:   Recently seen by orthopedics. PATIENT SURVEYS:  Attempted LEFS and KOOD, neither of which were compatible with pt's self-report capacity.  COGNITION: Overall cognitive status: globally WNL, mentation somewhat limiting to ability to response to LEFS/KOOS in a meaningful way.   EDEMA:  None seen upon visual inspection  PALPATION: Rt knee extensor mechanism; not tender, no focal or global edema/bogginess, healthy muscle  mass JOINT RANGE:   ROM Right eval Left eval  Knee flexion >110*   Knee extension full full   *pt reports not limited, flexoin at baseline  FUNCTIONAL TESTS:  5xSTS: 20sec, hands free, no LOB, mild antalgia reps 4-5  442ft AMB  overground: SPC use, 8 minutes, 38 sec (11/16/23)   GAIT: Distance walked: 22 meters Assistive device utilized: SPC  Level of assistance: ModI  Comments: slow, appears to be limited more by baseline balance deficits  TODAY'S TREATMENT 01/02/24 :    Quick assessment of right distal quad pain - some clicking with knee ext/flex with reported pain with open knee ext AROM. No significant tenderness to touch.  TA- To improve functional movements patterns for everyday tasks   Ambulation with 2# AW without assistance x 350 ft (min c/o LBP on 2nd lap)  Side step + minisquat- in // bars - down and back x 5 (VC for step width and coordination of activity)   Ambulation with 2# AW without assistance x 350 ft (Patient did well on 1st lap but complained over overall fatigue with 2nd lap and back giving out)  Heel raises 2 x 15 with 2# AW no UE support    NMR: To facilitate reeducation of movement, balance, posture, coordination, and/or proprioception/kinesthetic sense.  High knee march walk 2# AW down and then retro walk back to start x 8. VC for step height with march and for increased step length with bwd ambulation.  Tandem standing x 30 sec each LE without UE Support (unstable initially but able to hold without UE)  360 deg turning to left then right < 4 sec x 2 each direction and no LOB  SLS - 10+ sec each leg x 2         PATIENT EDUCATION:  Education details: Pt educated throughout session about proper posture and technique with exercises. Improved exercise technique, movement at target joints, use of target muscles after min to  mod verbal, visual, tactile cues. Person educated: Pt  Education method: collaborative learning, deliberate practice, positive reinforcement, explicit instruction, establish rules. Education comprehension: seems ok; FU in future to determine LT comprehension/adherence   Benefits of ergonomic handle on cane for hand pain management at metacarpals.   HOME EXERCISE PROGRAM: Access Code: [email or web address] URL: [email or web address] Date: 11/14/2023 Prepared by: Peggye Linear  Exercises - Supine Knee Extension Strengthening  - 2 x daily - 1 sets - 15 reps - Supine Bridge  - 2 x daily - 1 sets - 10 reps (low range as not to aggravate chronic low back pain)  - Seated Long Arc Quad  - 2 x daily - 1 sets - 10 reps - Heel Raises with Counter Support  - 2 x daily - 1 sets - 15 reps  ASSESSMENT:  CLINICAL IMPRESSION:  Treatment focused on improving LE strength and functional endurance without causing increased R knee or LBP. Patient performed well overall- still limited with progressive distance walking due to some report of low back pain. Overall - progressing with balance and able to work on several components of BERG today with good results- increased Tandem and SLS time and decreased time to turn around each direction. Pt will continue to benefit from skilled physical therapy intervention to address impairments, improve QOL, and attain therapy goals.         OBJECTIVE IMPAIRMENTS: Decreased knowledge of condition, decreased use of DME, decreased mobility, difficulty walking, decreased strength.  ACTIVITY LIMITATIONS: Lifting, standing, walking, squatting, transfers, locomotion level PARTICIPATION LIMITATIONS: Cleaning, laundry, interpersonal relationships, driving, yardwork, community activity.  PERSONAL FACTORS: Age, behavior pattern, education, past/current experiences, transportation, profession  are also affecting patient's functional outcome.  REHAB POTENTIAL: Good.  CLINICAL  DECISION MAKING: moderate  EVALUATION COMPLEXITY: Stable/uncomplicated   GOALS: Goals reviewed with patient? No  SHORT TERM GOALS: Target date: 12/15/23 Patient will report comprehension, confidence, and consistent compliance and of a simple home exercise program established to facilitate symptoms management and basic strengthening and/or segment mobility.  Baseline: issued at visit 1; 12/14/2023=Patient reports walking outside and doing most of the exercisses Goal status: MET  2.  Pt to demonstrate improvement in transfers power AEB 5xSTS<17seconds. Baseline: 5XSTS 20sec on visit 1; 12/14/2023: 13.98 sec without UE support Goal status: MET  LONG TERM GOALS: Target date: 01/15/24 Pt to demonstrate ability to perform without pain limitation, less than 2/10 increase in NPRS, and with >10% improvement in distance to improve ability to participate in IADL and social events.   Baseline: pending assessment visit 2 ; 11/20/2023=440 feet with SPC and 550 feet with 4WW; 12/14/2023= 630 feet without UE support with report of only minimal low back pain reported during last 30 sec of walk.  Goal status: Progressing  2.  Patient to demonstrate improved performance on initial transfers assessment AEB improved reps per time or time per perform desired reps and/or reduced seat height and/or use of hands in order to improve safety, tolerance, and independence in ADL performance.   Baseline: visit 1 5xSTS: 20sec; 12/14/2023= 13.98 sec without UE support  Goal status: PROGRESSING  3.  Pt to report ability to perform housework an dlighthousehold activities ad lib without painful restrictions to these activities.  Baseline: limited by pain at eval; 12/14/2023= Patient reports doing household chores with some intermittent rest breaks- including washing clothes, dishes, sweeping, vacuuming.  Goal status: PROGRESSING  4. Pt will improve BERG by at least 3 points in order to demonstrate clinically significant  improvement in balance.  Baseline: 12/14/2023= 50/56  Goal status: NEW  5.  Patient will improve with gait speed as seen by improved 10 MWT > 0.7 m/s with no device vs. Cane for improved mobility and decreased risk of falling.   PLAN:  PT FREQUENCY: 1-2x/week  PT DURATION: 8 weeks  PLANNED INTERVENTIONS: 97110-Therapeutic exercises, 97530- Therapeutic activity, 97112- Neuromuscular re-education, 97535- Self Care, 02859- Manual therapy, G0283- Electrical stimulation (unattended), [phone]- Electrical stimulation (manual), Patient/Family education, Balance training, Stair training, Joint mobilization, Joint manipulation, Visual/preceptual remediation/compensation, DME instructions, Cryotherapy, and Moist heat  PLAN FOR NEXT SESSION:   Progressive balance interventions Progress LE strengthening  Progress gait speed activities.   8:36 AM, 01/03/24   Reyes LOISE London, PT 01/03/2024, 8:36 AM

## 2024-01-02 NOTE — Therapy (Incomplete)
 OUTPATIENT PHYSICAL THERAPY TREATMENT  Patient Name: Tracie Fisher MRN: [id number] DOB:01-23-1936, 88 y.o., female Today's Date: 01/02/2024  END OF SESSION:            Past Medical History:  Diagnosis Date   Cancer (HCC)    SKIN   Complication of anesthesia    Depression    Dysrhythmia    GERD (gastroesophageal reflux disease)    Hiatal hernia    HOH (hard of hearing)    Hypertension    Hypothyroidism    PONV (postoperative nausea and vomiting)    Thyroid  disease    Past Surgical History:  Procedure Laterality Date   APPENDECTOMY     BACK SURGERY     MULTIPLE/ROD IN BACK   BREAST CYST ASPIRATION Left years ago   CATARACT EXTRACTION W/PHACO Left 03/21/2017   Procedure: CATARACT EXTRACTION PHACO AND INTRAOCULAR LENS PLACEMENT (IOC);  Surgeon: Jaye Fallow, MD;  Location: [facility];  Service: Ophthalmology;  Laterality: Left;  US  00:48 AP% 17.8 CDE 8.57 Fluid pack lot # [id number]   CATARACT EXTRACTION W/PHACO Right 04/11/2017   Procedure: CATARACT EXTRACTION PHACO AND INTRAOCULAR LENS PLACEMENT (IOC);  Surgeon: Jaye Fallow, MD;  Location: [facility];  Service: Ophthalmology;  Laterality: Right;  US  01:09.5 AP% 15.8 CDE 11.04 Fluid Pack lot # [id number]   CHOLECYSTECTOMY     COLON SURGERY     BOWEL OBSTRUCTION   COLONOSCOPY WITH PROPOFOL  N/A 04/25/2016   Procedure: COLONOSCOPY WITH PROPOFOL ;  Surgeon: Ruel Kung, MD;  Location: [facility];  Service: Endoscopy;  Laterality: N/A;   ESOPHAGOGASTRODUODENOSCOPY (EGD) WITH PROPOFOL  N/A 04/25/2016   Procedure: ESOPHAGOGASTRODUODENOSCOPY (EGD) WITH PROPOFOL ;  Surgeon: Ruel Kung, MD;  Location: [facility];  Service: Endoscopy;  Laterality: N/A;   EXPLORATORY LAPAROTOMY  10/18/2011   blockage small intestines   GIVENS CAPSULE STUDY N/A 05/11/2016   Procedure: GIVENS CAPSULE STUDY;  Surgeon: Ruel Kung, MD;  Location: [facility];  Service: Endoscopy;  Laterality: N/A;   Patient Active Problem List    Diagnosis Date Noted   Carotid stenosis 10/22/2023   Long-term current use of benzodiazepine 08/23/2023   Lymphedema 03/29/2023   Anemia due to stage 4 chronic kidney disease (HCC) 02/04/2023   Overweight (BMI 25.0-29.9) 08/05/2022   Vision loss 08/04/2022   Senile purpura (HCC) 06/16/2022   Insomnia 06/16/2022   Varicose veins of both lower extremities without ulcer or inflammation 06/16/2022   Mixed hyperlipidemia 06/12/2022   Occipital neuralgia 06/12/2022   Sensory ataxia 06/12/2022   Osteopenia of left forearm 06/12/2022   CMC arthritis 05/23/2022   CKD (chronic kidney disease) stage 4, GFR 15-29 ml/min (HCC) 03/03/2022   Hypothyroidism, adult 03/01/2022   Loss of memory 07/26/2021   Moderate pulmonary hypertension (HCC) 05/20/2021   Primary osteoarthritis involving multiple joints 03/24/2021   Unsteady gait 10/16/2019   History of stroke 03/04/2019   Bilateral foot pain 03/06/2018   Chronic bilateral low back pain 03/06/2018   Neck pain 03/06/2018   Neuropathy 02/06/2018   History of nonmelanoma skin cancer 09/27/2016   Essential hypertension 09/02/2016   Hiatal hernia    Stricture of esophagus    Iron  deficiency anemia, unspecified 03/23/2016   History of tachycardia 07/29/2015   PCP: [name], NP  REFERRING PROVIDER: Lynwood Hue, MD  REFERRING DIAG: Rt knee pain  THERAPY DIAG:  No diagnosis found.  Rationale for Evaluation and Treatment: Rehabilitation  ONSET DATE: June 2025  SUBJECTIVE:  SUBJECTIVE STATEMENT:  Patient reports continuing to feel some better  and walking more without her cane. Been working on [employer]. Off and on knee pain but nothing significant.   PERTINENT HISTORY: 88yoF referred to OPPT for acute insidious Rt medial knee pain. Pt seen by Dr. Mardee [facility] orthopedics, per pt he recommended PT to gain muscle and strength in this knee, but generally the joint looked quite favorable. Unrelated, pt reports significant debility and difficulty  walking without limiting DOE since being hospitalized last year- this has created more of activity restriction than her knee pain alone.   PAIN:  Are you having pain? No pain today; knee ache and significant back pain last night.   PRECAUTIONS: None  WEIGHT BEARING RESTRICTIONS: No  FALLS:  Has patient fallen in last 6 months? Yes. Number of falls 1  LIVING ENVIRONMENT: Lives with: lives with their family and lives with their son Stairs: No Has following equipment at home: Single point cane and Environmental consultant - 2 wheeled  OCCUPATION: retired   PLOF: drives, AMB with SPC more recently or walker last year when she left [facility], history of falls/neuropathy.   PATIENT GOALS: resolution of knee pain   OBJECTIVE:  Note: Objective measures were completed at evaluation unless otherwise noted.  DIAGNOSTIC FINDINGS:   Recently seen by orthopedics. PATIENT SURVEYS:  Attempted LEFS and KOOD, neither of which were compatible with pt's self-report capacity.  COGNITION: Overall cognitive status: globally WNL, mentation somewhat limiting to ability to response to LEFS/KOOS in a meaningful way.   EDEMA:  None seen upon visual inspection  PALPATION: Rt knee extensor mechanism; not tender, no focal or global edema/bogginess, healthy muscle mass JOINT RANGE:   ROM Right eval Left eval  Knee flexion >110*   Knee extension full full   *pt reports not limited, flexoin at baseline  FUNCTIONAL TESTS:  5xSTS: 20sec, hands free, no LOB, mild antalgia reps 4-5  478ft AMB  overground: SPC use, 8 minutes, 38 sec (11/16/23)   GAIT: Distance walked: 22 meters Assistive device utilized: SPC  Level of assistance: ModI  Comments: slow, appears to be limited more by baseline balance deficits                                                                                                                              TODAY'S TREATMENT 01/02/24 :    TA- To improve functional movements patterns for everyday tasks    Gait with 2# AW without assistance x 350 ft STS x 8 on airex pad ( min A on last few reps), good balance once in standing position Gait with 2# AW without assistance x 350 ft STS x 8  Gait with 2# AW without assistance x 350 ft  Heel raises 2 x 15 with 2# AW no UE support!  Step up x 10 ea LE on 6 in step without UE support ( close cga)   *1 lap = 1 time each way to return to start point  NMR: To facilitate reeducation  of movement, balance, posture, coordination, and/or proprioception/kinesthetic sense.  Airex 3/4 romberg stance 2 x 45 sec ea LE  Airex march x 10 ea LE        PATIENT EDUCATION:  Education details: Pt educated throughout session about proper posture and technique with exercises. Improved exercise technique, movement at target joints, use of target muscles after min to mod verbal, visual, tactile cues. Person educated: Pt  Education method: collaborative learning, deliberate practice, positive reinforcement, explicit instruction, establish rules. Education comprehension: seems ok; FU in future to determine LT comprehension/adherence   Benefits of ergonomic handle on cane for hand pain management at metacarpals.   HOME EXERCISE PROGRAM: Access Code: [email or web address] URL: [email or web address] Date: 11/14/2023 Prepared by: Peggye Linear  Exercises - Supine Knee Extension Strengthening  - 2 x daily - 1 sets - 15 reps - Supine Bridge  - 2 x daily - 1 sets - 10 reps (low range as not to aggravate chronic low back pain)  - Seated Long Arc Quad  - 2 x daily - 1 sets - 10 reps - Heel Raises with Counter Support  - 2 x daily - 1 sets - 15 reps  ASSESSMENT:  CLINICAL IMPRESSION:  Continued with current plan of care as laid out in evaluation and recent prior sessions. Pt remains motivated to advance progress toward goals in order to maximize independence and safety at home. Pt progressing with resisted gait this date and with increased difficulty with STS  with airex pad under feet providing balance and increased muscular strength challenges. Pt able to perform step ups to 6 in step without UE assist showing improved muscular force production and balance in Les. Pt still having coughing spells at times unrelated to illness, seeing cardiologist soon regarding issue.  Pt continues to demonstrate progress toward goals AEB progression of some interventions this date either in volume or intensity.      OBJECTIVE IMPAIRMENTS: Decreased knowledge of condition, decreased use of DME, decreased mobility, difficulty walking, decreased strength.  ACTIVITY LIMITATIONS: Lifting, standing, walking, squatting, transfers, locomotion level PARTICIPATION LIMITATIONS: Cleaning, laundry, interpersonal relationships, driving, yardwork, community activity.  PERSONAL FACTORS: Age, behavior pattern, education, past/current experiences, transportation, profession  are also affecting patient's functional outcome.  REHAB POTENTIAL: Good.  CLINICAL DECISION MAKING: moderate  EVALUATION COMPLEXITY: Stable/uncomplicated   GOALS: Goals reviewed with patient? No  SHORT TERM GOALS: Target date: 12/15/23 Patient will report comprehension, confidence, and consistent compliance and of a simple home exercise program established to facilitate symptoms management and basic strengthening and/or segment mobility.  Baseline: issued at visit 1; 12/14/2023=Patient reports walking outside and doing most of the exercisses Goal status: MET  2.  Pt to demonstrate improvement in transfers power AEB 5xSTS<17seconds. Baseline: 5XSTS 20sec on visit 1; 12/14/2023: 13.98 sec without UE support Goal status: INITIAL  LONG TERM GOALS: Target date: 01/15/24 Pt to demonstrate ability to perform without pain limitation, less than 2/10 increase in NPRS, and with >10% improvement in distance to improve ability to participate in IADL and social events.   Baseline: pending assessment visit 2 ;  11/20/2023=440 feet with SPC and 550 feet with 4WW; 12/14/2023= 630 feet without UE support with report of only minimal low back pain reported during last 30 sec of walk.  Goal status: Progressing  2.  Patient to demonstrate improved performance on initial transfers assessment AEB improved reps per time or time per perform desired reps and/or reduced seat height and/or use of hands in order to improve safety, tolerance,  and independence in ADL performance.   Baseline: visit 1 5xSTS: 20sec; 12/14/2023= 13.98 sec without UE support  Goal status: PROGRESSING  3.  Pt to report ability to perform housework an dlighthousehold activities ad lib without painful restrictions to these activities.  Baseline: limited by pain at eval; 12/14/2023= Patient reports doing household chores with some intermittent rest breaks- including washing clothes, dishes, sweeping, vacuuming.  Goal status: PROGRESSING  4. Pt will improve BERG by at least 3 points in order to demonstrate clinically significant improvement in balance.  Baseline: 12/14/2023= 50/56  Goal status: NEW  5.  Patient will improve with gait speed as seen by improved 10 MWT > 0.7 m/s with no device vs. Cane for improved mobility and decreased risk of falling.   PLAN:  PT FREQUENCY: 1-2x/week  PT DURATION: 8 weeks  PLANNED INTERVENTIONS: 97110-Therapeutic exercises, 97530- Therapeutic activity, 97112- Neuromuscular re-education, 97535- Self Care, 02859- Manual therapy, G0283- Electrical stimulation (unattended), [phone]- Electrical stimulation (manual), Patient/Family education, Balance training, Stair training, Joint mobilization, Joint manipulation, Visual/preceptual remediation/compensation, DME instructions, Cryotherapy, and Moist heat  PLAN FOR NEXT SESSION:   Progressive balance interventions Progress LE strengthening  Progress gait speed activities.   9:58 AM, 01/02/24   Reyes LOISE London, PT 01/02/2024, 9:58 AM

## 2024-01-04 ENCOUNTER — Ambulatory Visit

## 2024-01-04 DIAGNOSIS — M25561 Pain in right knee: Secondary | ICD-10-CM | POA: Diagnosis not present

## 2024-01-04 DIAGNOSIS — R278 Other lack of coordination: Secondary | ICD-10-CM | POA: Diagnosis not present

## 2024-01-04 DIAGNOSIS — R269 Unspecified abnormalities of gait and mobility: Secondary | ICD-10-CM | POA: Diagnosis not present

## 2024-01-04 DIAGNOSIS — R2681 Unsteadiness on feet: Secondary | ICD-10-CM | POA: Diagnosis not present

## 2024-01-04 DIAGNOSIS — R262 Difficulty in walking, not elsewhere classified: Secondary | ICD-10-CM

## 2024-01-04 DIAGNOSIS — M6281 Muscle weakness (generalized): Secondary | ICD-10-CM

## 2024-01-04 NOTE — Therapy (Signed)
 OUTPATIENT PHYSICAL THERAPY TREATMENT/RE-CERT  Patient Name: LAYANA KONKEL MRN: [id number] DOB:09-25-1935, 88 y.o., female Today's Date: 01/04/2024  END OF SESSION:  PT End of Session - 01/04/24 1008     Visit Number 16    Number of Visits 16    Date for PT Re-Evaluation 01/09/24    Authorization Type [employer]    Authorization Time Period 11/14/23-01/09/24    Progress Note Due on Visit 20    PT Start Time 1015    PT Stop Time 1100    PT Time Calculation (min) 45 min    Equipment Utilized During Treatment Gait belt    Activity Tolerance Patient tolerated treatment well;No increased pain    Behavior During Therapy WFL for tasks assessed/performed          Past Medical History:  Diagnosis Date   Cancer (HCC)    SKIN   Complication of anesthesia    Depression    Dysrhythmia    GERD (gastroesophageal reflux disease)    Hiatal hernia    HOH (hard of hearing)    Hypertension    Hypothyroidism    PONV (postoperative nausea and vomiting)    Thyroid  disease    Past Surgical History:  Procedure Laterality Date   APPENDECTOMY     BACK SURGERY     MULTIPLE/ROD IN BACK   BREAST CYST ASPIRATION Left years ago   CATARACT EXTRACTION W/PHACO Left 03/21/2017   Procedure: CATARACT EXTRACTION PHACO AND INTRAOCULAR LENS PLACEMENT (IOC);  Surgeon: Jaye Fallow, MD;  Location: [facility];  Service: Ophthalmology;  Laterality: Left;  US  00:48 AP% 17.8 CDE 8.57 Fluid pack lot # [id number]   CATARACT EXTRACTION W/PHACO Right 04/11/2017   Procedure: CATARACT EXTRACTION PHACO AND INTRAOCULAR LENS PLACEMENT (IOC);  Surgeon: Jaye Fallow, MD;  Location: [facility];  Service: Ophthalmology;  Laterality: Right;  US  01:09.5 AP% 15.8 CDE 11.04 Fluid Pack lot # [id number]   CHOLECYSTECTOMY     COLON SURGERY     BOWEL OBSTRUCTION   COLONOSCOPY WITH PROPOFOL  N/A 04/25/2016   Procedure: COLONOSCOPY WITH PROPOFOL ;  Surgeon: Ruel Kung, MD;  Location: [facility];  Service: Endoscopy;   Laterality: N/A;   ESOPHAGOGASTRODUODENOSCOPY (EGD) WITH PROPOFOL  N/A 04/25/2016   Procedure: ESOPHAGOGASTRODUODENOSCOPY (EGD) WITH PROPOFOL ;  Surgeon: Ruel Kung, MD;  Location: [facility];  Service: Endoscopy;  Laterality: N/A;   EXPLORATORY LAPAROTOMY  10/18/2011   blockage small intestines   GIVENS CAPSULE STUDY N/A 05/11/2016   Procedure: GIVENS CAPSULE STUDY;  Surgeon: Ruel Kung, MD;  Location: [facility];  Service: Endoscopy;  Laterality: N/A;   Patient Active Problem List   Diagnosis Date Noted   Carotid stenosis 10/22/2023   Long-term current use of benzodiazepine 08/23/2023   Lymphedema 03/29/2023   Anemia due to stage 4 chronic kidney disease (HCC) 02/04/2023   Overweight (BMI 25.0-29.9) 08/05/2022   Vision loss 08/04/2022   Senile purpura (HCC) 06/16/2022   Insomnia 06/16/2022   Varicose veins of both lower extremities without ulcer or inflammation 06/16/2022   Mixed hyperlipidemia 06/12/2022   Occipital neuralgia 06/12/2022   Sensory ataxia 06/12/2022   Osteopenia of left forearm 06/12/2022   CMC arthritis 05/23/2022   CKD (chronic kidney disease) stage 4, GFR 15-29 ml/min (HCC) 03/03/2022   Hypothyroidism, adult 03/01/2022   Loss of memory 07/26/2021   Moderate pulmonary hypertension (HCC) 05/20/2021   Primary osteoarthritis involving multiple joints 03/24/2021   Unsteady gait 10/16/2019   History of stroke 03/04/2019   Bilateral foot pain 03/06/2018  Chronic bilateral low back pain 03/06/2018   Neck pain 03/06/2018   Neuropathy 02/06/2018   History of nonmelanoma skin cancer 09/27/2016   Essential hypertension 09/02/2016   Hiatal hernia    Stricture of esophagus    Iron  deficiency anemia, unspecified 03/23/2016   History of tachycardia 07/29/2015   PCP: [name], NP  REFERRING PROVIDER: Lynwood Hue, MD  REFERRING DIAG: Rt knee pain  THERAPY DIAG:  Abnormality of gait and mobility  Acute pain of right knee  Difficulty in walking, not  elsewhere classified  Muscle weakness (generalized)  Unsteadiness on feet  Other lack of coordination  Rationale for Evaluation and Treatment: Rehabilitation  ONSET DATE: June 2025  SUBJECTIVE:  SUBJECTIVE STATEMENT:  Pt reports some slight pain in the L hand today, but other than that she is doing well.     PERTINENT HISTORY: 88yoF referred to OPPT for acute insidious Rt medial knee pain. Pt seen by Dr. Hue [facility], per pt he recommended PT to gain muscle and strength in this knee, but generally the joint looked quite favorable. Unrelated, pt reports significant debility and difficulty walking without limiting DOE since being hospitalized last year- this has created more of activity restriction than her knee pain alone.   PAIN:  Are you having pain? No pain today; knee ache and significant back pain last night.   PRECAUTIONS: None  WEIGHT BEARING RESTRICTIONS: No  FALLS:  Has patient fallen in last 6 months? Yes. Number of falls 1  LIVING ENVIRONMENT: Lives with: lives with their family and lives with their son Stairs: No Has following equipment at home: Single point cane and Environmental consultant - 2 wheeled  OCCUPATION: retired   PLOF: drives, AMB with SPC more recently or walker last year when she left [employer], history of falls/neuropathy.   PATIENT GOALS: resolution of knee pain   OBJECTIVE:  Note: Objective measures were completed at evaluation unless otherwise noted.  DIAGNOSTIC FINDINGS:   Recently seen by orthopedics. PATIENT SURVEYS:  Attempted LEFS and KOOD, neither of which were compatible with pt's self-report capacity.  COGNITION: Overall cognitive status: globally WNL, mentation somewhat limiting to ability to response to LEFS/KOOS in a meaningful way.   EDEMA:  None seen upon visual inspection  PALPATION: Rt knee extensor mechanism; not tender, no focal or global edema/bogginess, healthy muscle mass JOINT RANGE:   ROM Right eval Left eval  Knee  flexion >110*   Knee extension full full   *pt reports not limited, flexoin at baseline  FUNCTIONAL TESTS:  5xSTS: 20sec, hands free, no LOB, mild antalgia reps 4-5  450ft AMB  overground: SPC use, 8 minutes, 38 sec (11/16/23)   GAIT: Distance walked: 22 meters Assistive device utilized: SPC  Level of assistance: ModI  Comments: slow, appears to be limited more by baseline balance deficits  TODAY'S TREATMENT 01/02/24 :    6 Min Walk Test:  Instructed patient to ambulate as quickly and as safely as possible for 6 minutes using LRAD. Patient was allowed to take standing rest breaks without stopping the test, but if the patient required a sitting rest break the clock would be stopped and the test would be over.  Results: 794 feet without AD and CGA utilized.  2-3 standing rest breaks utilized. Results indicate that the patient has reduced endurance with ambulation compared to age matched norms.  Age Matched Norms (in meters): 63-69 yo M: 78 F: 46, 42-79 yo M: 71 F: 471, 59-89 yo M: 417 F: 392 MDC: 58.21 meters (190.98 feet) or 50 meters (ANPTA Core Set of Outcome Measures for Adults with Neurologic Conditions, 2018)   Five times Sit to Stand Test (FTSS)  TIME: 16.33 sec  Cut off scores indicative of increased fall risk: >12 sec CVA, >16 sec PD, >13 sec vestibular (ANPTA Core Set of Outcome Measures for Adults with Neurologic Conditions, 2018)   Patient demonstrates increased fall risk as noted by score of  52/56 on Berg Balance Scale.  (<36= high risk for falls, close to 100%; 37-45 significant >80%; 46-51 moderate >50%; 52-55 lower >25%)        PATIENT EDUCATION:  Education details: Pt educated throughout session about proper posture and technique with exercises. Improved exercise technique, movement at target joints, use of target muscles after min to  mod verbal, visual, tactile cues. Person educated: Pt  Education method: collaborative learning, deliberate practice, positive reinforcement, explicit instruction, establish rules. Education comprehension: seems ok; FU in future to determine LT comprehension/adherence   Benefits of ergonomic handle on cane for hand pain management at metacarpals.   HOME EXERCISE PROGRAM: Access Code: [email or web address] URL: [email or web address] Date: 11/14/2023 Prepared by: Peggye Linear  Exercises - Supine Knee Extension Strengthening  - 2 x daily - 1 sets - 15 reps - Supine Bridge  - 2 x daily - 1 sets - 10 reps (low range as not to aggravate chronic low back pain)  - Seated Long Arc Quad  - 2 x daily - 1 sets - 10 reps - Heel Raises with Counter Support  - 2 x daily - 1 sets - 15 reps  ASSESSMENT:  CLINICAL IMPRESSION:  Pt performed well when assessed for current goals.  Pt unable to meet all goals at this time, however has made significant improvements towards all of the goals.  Pt still lacking significant endurance levels in order to perform well on the and should be a consideration for future treatment sessions.  Pt ultimately states her balance is still not where it needs to be either, but endurance levels are most important at this time.  Patient's condition has the potential to improve in response to therapy. Maximum improvement is yet to be obtained. The anticipated improvement is attainable and reasonable in a generally predictable time.   Pt will continue to benefit from skilled therapy to address remaining deficits in order to improve overall QoL and return to PLOF.           OBJECTIVE IMPAIRMENTS: Decreased knowledge of condition, decreased use of DME, decreased mobility, difficulty walking, decreased strength.  ACTIVITY LIMITATIONS: Lifting, standing, walking, squatting, transfers, locomotion level PARTICIPATION LIMITATIONS: Cleaning, laundry, interpersonal relationships,  driving, yardwork, community activity.  PERSONAL FACTORS: Age, behavior pattern, education, past/current experiences, transportation, profession  are also affecting patient's functional outcome.  REHAB POTENTIAL: Good.  CLINICAL DECISION MAKING: moderate  EVALUATION COMPLEXITY: Stable/uncomplicated   GOALS: Goals reviewed with patient? No  SHORT TERM GOALS: Target date: 12/15/23 Patient will report comprehension, confidence, and consistent compliance and of a simple home exercise program established to facilitate symptoms management and basic strengthening and/or segment mobility.  Baseline: issued at visit 1; 12/14/2023=Patient reports walking outside and doing most of the exercisses Goal status: MET  2.  Pt to demonstrate improvement in transfers power AEB 5xSTS<17seconds. Baseline: 5XSTS 20sec on visit 1; 12/14/2023: 13.98 sec without UE support Goal status: MET  LONG TERM GOALS: Target date: 01/15/24 Pt to demonstrate ability to perform without pain limitation, less than 2/10 increase in NPRS, and with >10% improvement in distance to improve ability to participate in IADL and social events.   Baseline: pending assessment visit 2 ; 11/20/2023=440 feet with SPC and 550 feet with 4WW;  12/14/2023= 630 feet without UE support with report of only minimal low back pain reported during last 30 sec of walk.  01/04/24: 794' without UE support, 2/10 pain and 1 standing rest break Goal status: Progressing  2.  Patient to demonstrate improved performance on initial transfers assessment AEB improved reps per time or time per perform desired reps and/or reduced seat height and/or use of hands in order to improve safety, tolerance, and independence in ADL performance.   Baseline: visit 1 5xSTS: 20sec; 12/14/2023= 13.98 sec without UE support  01/04/24: 16.33 sec without UE support Goal status: PROGRESSING  3.  Pt to report ability to perform housework and light household activities ad lib without  painful restrictions to these activities.  Baseline: limited by pain at eval; 12/14/2023= Patient reports doing household chores with some intermittent rest breaks- including washing clothes, dishes, sweeping, vacuuming.  01/04/24: Pt is able to do everything without any complications other than the L hand.  Back is much better. Goal status: MET  4. Pt will improve BERG by at least 3 points in order to demonstrate clinically significant improvement in balance.  Baseline: 12/14/2023= 50/56  01/04/24: 52/56  Goal status: PROGRESSING  5.  Patient will improve with gait speed as seen by improved 10 MWT > 1.25m/s with no device vs. Cane for improved mobility and decreased risk of falling.  Baseline: 01/04/24: 0.61m/s Goal Status: NEW  PLAN:  PT FREQUENCY: 1-2x/week  PT DURATION: 8 weeks  PLANNED INTERVENTIONS: 97110-Therapeutic exercises, 97530- Therapeutic activity, 97112- Neuromuscular re-education, 97535- Self Care, [id number]- Manual therapy, [id number]- Electrical stimulation (unattended), [phone]- Electrical stimulation (manual), Patient/Family education, Balance training, Stair training, Joint mobilization, Joint manipulation, Visual/preceptual remediation/compensation, DME instructions, Cryotherapy, and Moist heat  PLAN FOR NEXT SESSION:   Progressive balance interventions Progress LE strengthening  Progress gait speed activities.    Fonda Simpers, PT, DPT Physical Therapist - [facility]  01/04/24, 10:18 AM

## 2024-01-08 NOTE — Therapy (Signed)
 OUTPATIENT PHYSICAL THERAPY TREATMENT  Patient Name: Tracie Fisher MRN: [id number] DOB:February 15, 1936, 88 y.o., female Today's Date: 01/09/2024  END OF SESSION:  PT End of Session - 01/09/24 1101     Visit Number 17    Number of Visits 32    Date for PT Re-Evaluation 02/29/24    Authorization Type [employer]    Authorization Time Period 01/04/24-02/29/24    Progress Note Due on Visit 20    PT Start Time 1102    PT Stop Time 1144    PT Time Calculation (min) 42 min    Equipment Utilized During Treatment Gait belt    Activity Tolerance Patient tolerated treatment well;No increased pain    Behavior During Therapy WFL for tasks assessed/performed           Past Medical History:  Diagnosis Date   Cancer (HCC)    SKIN   Complication of anesthesia    Depression    Dysrhythmia    GERD (gastroesophageal reflux disease)    Hiatal hernia    HOH (hard of hearing)    Hypertension    Hypothyroidism    PONV (postoperative nausea and vomiting)    Thyroid  disease    Past Surgical History:  Procedure Laterality Date   APPENDECTOMY     BACK SURGERY     MULTIPLE/ROD IN BACK   BREAST CYST ASPIRATION Left years ago   CATARACT EXTRACTION W/PHACO Left 03/21/2017   Procedure: CATARACT EXTRACTION PHACO AND INTRAOCULAR LENS PLACEMENT (IOC);  Surgeon: Jaye Fallow, MD;  Location: [facility];  Service: Ophthalmology;  Laterality: Left;  US  00:48 AP% 17.8 CDE 8.57 Fluid pack lot # [id number]   CATARACT EXTRACTION W/PHACO Right 04/11/2017   Procedure: CATARACT EXTRACTION PHACO AND INTRAOCULAR LENS PLACEMENT (IOC);  Surgeon: Jaye Fallow, MD;  Location: [facility];  Service: Ophthalmology;  Laterality: Right;  US  01:09.5 AP% 15.8 CDE 11.04 Fluid Pack lot # [id number]   CHOLECYSTECTOMY     COLON SURGERY     BOWEL OBSTRUCTION   COLONOSCOPY WITH PROPOFOL  N/A 04/25/2016   Procedure: COLONOSCOPY WITH PROPOFOL ;  Surgeon: Ruel Kung, MD;  Location: [facility];  Service: Endoscopy;   Laterality: N/A;   ESOPHAGOGASTRODUODENOSCOPY (EGD) WITH PROPOFOL  N/A 04/25/2016   Procedure: ESOPHAGOGASTRODUODENOSCOPY (EGD) WITH PROPOFOL ;  Surgeon: Ruel Kung, MD;  Location: [facility];  Service: Endoscopy;  Laterality: N/A;   EXPLORATORY LAPAROTOMY  10/18/2011   blockage small intestines   GIVENS CAPSULE STUDY N/A 05/11/2016   Procedure: GIVENS CAPSULE STUDY;  Surgeon: Ruel Kung, MD;  Location: [facility];  Service: Endoscopy;  Laterality: N/A;   Patient Active Problem List   Diagnosis Date Noted   Carotid stenosis 10/22/2023   Long-term current use of benzodiazepine 08/23/2023   Lymphedema 03/29/2023   Anemia due to stage 4 chronic kidney disease (HCC) 02/04/2023   Overweight (BMI 25.0-29.9) 08/05/2022   Vision loss 08/04/2022   Senile purpura (HCC) 06/16/2022   Insomnia 06/16/2022   Varicose veins of both lower extremities without ulcer or inflammation 06/16/2022   Mixed hyperlipidemia 06/12/2022   Occipital neuralgia 06/12/2022   Sensory ataxia 06/12/2022   Osteopenia of left forearm 06/12/2022   CMC arthritis 05/23/2022   CKD (chronic kidney disease) stage 4, GFR 15-29 ml/min (HCC) 03/03/2022   Hypothyroidism, adult 03/01/2022   Loss of memory 07/26/2021   Moderate pulmonary hypertension (HCC) 05/20/2021   Primary osteoarthritis involving multiple joints 03/24/2021   Unsteady gait 10/16/2019   History of stroke 03/04/2019   Bilateral foot pain 03/06/2018  Chronic bilateral low back pain 03/06/2018   Neck pain 03/06/2018   Neuropathy 02/06/2018   History of nonmelanoma skin cancer 09/27/2016   Essential hypertension 09/02/2016   Hiatal hernia    Stricture of esophagus    Iron  deficiency anemia, unspecified 03/23/2016   History of tachycardia 07/29/2015   PCP: [name], NP  REFERRING PROVIDER: Lynwood Hue, MD  REFERRING DIAG: Rt knee pain  THERAPY DIAG:  Abnormality of gait and mobility  Acute pain of right knee  Difficulty in walking, not  elsewhere classified  Muscle weakness (generalized)  Unsteadiness on feet  Other lack of coordination  Rationale for Evaluation and Treatment: Rehabilitation  ONSET DATE: June 2025  SUBJECTIVE:  SUBJECTIVE STATEMENT:  Patient reports no knee pain over the weekend but having some left hand pain.   PERTINENT HISTORY: 88yoF referred to OPPT for acute insidious Rt medial knee pain. Pt seen by Dr. Hue [facility], per pt he recommended PT to gain muscle and strength in this knee, but generally the joint looked quite favorable. Unrelated, pt reports significant debility and difficulty walking without limiting DOE since being hospitalized last year- this has created more of activity restriction than her knee pain alone.   PAIN:  Are you having pain? No pain today; knee ache and significant back pain last night.   PRECAUTIONS: None  WEIGHT BEARING RESTRICTIONS: No  FALLS:  Has patient fallen in last 6 months? Yes. Number of falls 1  LIVING ENVIRONMENT: Lives with: lives with their family and lives with their son Stairs: No Has following equipment at home: Single point cane and Environmental consultant - 2 wheeled  OCCUPATION: retired   PLOF: drives, AMB with SPC more recently or walker last year when she left [employer], history of falls/neuropathy.   PATIENT GOALS: resolution of knee pain   OBJECTIVE:  Note: Objective measures were completed at evaluation unless otherwise noted.  DIAGNOSTIC FINDINGS:   Recently seen by orthopedics. PATIENT SURVEYS:  Attempted LEFS and KOOD, neither of which were compatible with pt's self-report capacity.  COGNITION: Overall cognitive status: globally WNL, mentation somewhat limiting to ability to response to LEFS/KOOS in a meaningful way.   EDEMA:  None seen upon visual inspection  PALPATION: Rt knee extensor mechanism; not tender, no focal or global edema/bogginess, healthy muscle mass JOINT RANGE:   ROM Right eval Left eval  Knee flexion >110*    Knee extension full full   *pt reports not limited, flexoin at baseline  FUNCTIONAL TESTS:  5xSTS: 20sec, hands free, no LOB, mild antalgia reps 4-5  458ft AMB  overground: SPC use, 8 minutes, 38 sec (11/16/23)   GAIT: Distance walked: 22 meters Assistive device utilized: SPC  Level of assistance: ModI  Comments: slow, appears to be limited more by baseline balance deficits  TODAY'S TREATMENT 01/09/24 :    Circuit style workout to focus on functional endurance: -Step up x 15 reps each LE  -calf raises x 15 reps -Ambulation in clinic x approx 350 feet (holding her SPC)   - Resistive side stepping in // bars with GTB - down and back x 4 - Sit to stand without UE x 12  -Ambulation in clinic x approx 350 feet (patient reported mildlow back tightness)   -Dynamic hip march x 20 reps alt LE (with min VC to perform slowly)  - Standing mini lunge squat x 12 reps ea LE -Ambulation in clinic x approx 350 feet  - tandem walking fwd/bwd x 10 in // bars - initially unsteady and c/o fatigue but able to complete.       PATIENT EDUCATION:  Education details: Pt educated throughout session about proper posture and technique with exercises. Improved exercise technique, movement at target joints, use of target muscles after min to mod verbal, visual, tactile cues. Person educated: Pt  Education method: collaborative learning, deliberate practice, positive reinforcement, explicit instruction, establish rules. Education comprehension: seems ok; FU in future to determine LT comprehension/adherence   Benefits of ergonomic handle on cane for hand pain management at metacarpals.   HOME EXERCISE PROGRAM: Access Code: [email or web address] URL: [email or web address] Date: 11/14/2023 Prepared by: Peggye Linear  Exercises - Supine Knee Extension Strengthening  - 2 x daily -  1 sets - 15 reps - Supine Bridge  - 2 x daily - 1 sets - 10 reps (low range as not to aggravate chronic low back pain)  - Seated Long Arc Quad  - 2 x daily - 1 sets - 10 reps - Heel Raises with Counter Support  - 2 x daily - 1 sets - 15 reps  ASSESSMENT:  CLINICAL IMPRESSION:  Patient participated in engaging circuit style workout designed to keep her moving with minimal rest breaks overall today. She performed well overall- only limited by fatigue but able to spend majority of session working on either seated or standing activities. She continued to deny pain during and after session. She exhibited some decreased foot clearance with fatigue and some imbalance with fatigue with tandem walking but no significant LOB.   Pt will continue to benefit from skilled therapy to address remaining deficits in order to improve overall QoL and return to PLOF.           OBJECTIVE IMPAIRMENTS: Decreased knowledge of condition, decreased use of DME, decreased mobility, difficulty walking, decreased strength.  ACTIVITY LIMITATIONS: Lifting, standing, walking, squatting, transfers, locomotion level PARTICIPATION LIMITATIONS: Cleaning, laundry, interpersonal relationships, driving, yardwork, community activity.  PERSONAL FACTORS: Age, behavior pattern, education, past/current experiences, transportation, profession  are also affecting patient's functional outcome.  REHAB POTENTIAL: Good.  CLINICAL DECISION MAKING: moderate  EVALUATION COMPLEXITY: Stable/uncomplicated   GOALS: Goals reviewed with patient? No  SHORT TERM GOALS: Target date: 12/15/23 Patient will report comprehension, confidence, and consistent compliance and of a simple home exercise program established to facilitate symptoms management and basic strengthening and/or segment mobility.  Baseline: issued at visit 1; 12/14/2023=Patient reports walking outside and doing most of the exercisses Goal status: MET  2.  Pt to demonstrate improvement  in transfers power AEB 5xSTS<17seconds. Baseline: 5XSTS 20sec on visit 1; 12/14/2023: 13.98 sec without UE support Goal status: MET  LONG TERM GOALS: Target date: 01/15/24 Pt to demonstrate ability to perform without pain limitation, less than 2/10 increase in NPRS, and with >10% improvement in distance to improve  ability to participate in IADL and social events.   Baseline: pending assessment visit 2 ; 11/20/2023=440 feet with SPC and 550 feet with 4WW;  12/14/2023= 630 feet without UE support with report of only minimal low back pain reported during last 30 sec of walk.  01/04/24: 794' without UE support, 2/10 pain and 1 standing rest break Goal status: Progressing  2.  Patient to demonstrate improved performance on initial transfers assessment AEB improved reps per time or time per perform desired reps and/or reduced seat height and/or use of hands in order to improve safety, tolerance, and independence in ADL performance.   Baseline: visit 1 5xSTS: 20sec; 12/14/2023= 13.98 sec without UE support  01/04/24: 16.33 sec without UE support Goal status: PROGRESSING  3.  Pt to report ability to perform housework and light household activities ad lib without painful restrictions to these activities.  Baseline: limited by pain at eval; 12/14/2023= Patient reports doing household chores with some intermittent rest breaks- including washing clothes, dishes, sweeping, vacuuming.  01/04/24: Pt is able to do everything without any complications other than the L hand.  Back is much better. Goal status: MET  4. Pt will improve BERG by at least 3 points in order to demonstrate clinically significant improvement in balance.  Baseline: 12/14/2023= 50/56  01/04/24: 52/56  Goal status: PROGRESSING  5.  Patient will improve with gait speed as seen by improved 10 MWT > 1.59m/s with no device vs. Cane for improved mobility and decreased risk of falling.  Baseline: 01/04/24: 0.21m/s Goal Status: NEW  PLAN:  PT  FREQUENCY: 1-2x/week  PT DURATION: 8 weeks  PLANNED INTERVENTIONS: 97110-Therapeutic exercises, 97530- Therapeutic activity, 97112- Neuromuscular re-education, 97535- Self Care, [id number]- Manual therapy, [id number]- Electrical stimulation (unattended), [phone]- Electrical stimulation (manual), Patient/Family education, Balance training, Stair training, Joint mobilization, Joint manipulation, Visual/preceptual remediation/compensation, DME instructions, Cryotherapy, and Moist heat  PLAN FOR NEXT SESSION:   Progressive balance interventions Progress LE strengthening  Progress gait speed activities.    Chyrl London, PT Physical Therapist - [facility]  01/09/24, 10:40 PM

## 2024-01-09 ENCOUNTER — Ambulatory Visit

## 2024-01-09 DIAGNOSIS — R278 Other lack of coordination: Secondary | ICD-10-CM

## 2024-01-09 DIAGNOSIS — R262 Difficulty in walking, not elsewhere classified: Secondary | ICD-10-CM | POA: Diagnosis not present

## 2024-01-09 DIAGNOSIS — R2681 Unsteadiness on feet: Secondary | ICD-10-CM | POA: Diagnosis not present

## 2024-01-09 DIAGNOSIS — M25561 Pain in right knee: Secondary | ICD-10-CM | POA: Diagnosis not present

## 2024-01-09 DIAGNOSIS — R269 Unspecified abnormalities of gait and mobility: Secondary | ICD-10-CM

## 2024-01-09 DIAGNOSIS — M6281 Muscle weakness (generalized): Secondary | ICD-10-CM

## 2024-01-11 ENCOUNTER — Ambulatory Visit

## 2024-01-11 DIAGNOSIS — R278 Other lack of coordination: Secondary | ICD-10-CM | POA: Diagnosis not present

## 2024-01-11 DIAGNOSIS — M25561 Pain in right knee: Secondary | ICD-10-CM

## 2024-01-11 DIAGNOSIS — M6281 Muscle weakness (generalized): Secondary | ICD-10-CM | POA: Diagnosis not present

## 2024-01-11 DIAGNOSIS — R262 Difficulty in walking, not elsewhere classified: Secondary | ICD-10-CM

## 2024-01-11 DIAGNOSIS — R2681 Unsteadiness on feet: Secondary | ICD-10-CM | POA: Diagnosis not present

## 2024-01-11 DIAGNOSIS — R269 Unspecified abnormalities of gait and mobility: Secondary | ICD-10-CM

## 2024-01-11 NOTE — Therapy (Signed)
 OUTPATIENT PHYSICAL THERAPY TREATMENT  Patient Name: Tracie Fisher MRN: [id number] DOB:July 26, 1935, 88 y.o., female Today's Date: 01/11/2024  END OF SESSION:  PT End of Session - 01/11/24 1017     Visit Number 18    Number of Visits 32    Date for PT Re-Evaluation 02/29/24    Authorization Type [employer] Medicare    Authorization Time Period 01/04/24-02/29/24    Progress Note Due on Visit 20    PT Start Time 1018    PT Stop Time 1100    PT Time Calculation (min) 42 min    Equipment Utilized During Treatment Gait belt    Activity Tolerance Patient tolerated treatment well;No increased pain    Behavior During Therapy WFL for tasks assessed/performed           Past Medical History:  Diagnosis Date   Cancer (HCC)    SKIN   Complication of anesthesia    Depression    Dysrhythmia    GERD (gastroesophageal reflux disease)    Hiatal hernia    HOH (hard of hearing)    Hypertension    Hypothyroidism    PONV (postoperative nausea and vomiting)    Thyroid  disease    Past Surgical History:  Procedure Laterality Date   APPENDECTOMY     BACK SURGERY     MULTIPLE/ROD IN BACK   BREAST CYST ASPIRATION Left years ago   CATARACT EXTRACTION W/PHACO Left 03/21/2017   Procedure: CATARACT EXTRACTION PHACO AND INTRAOCULAR LENS PLACEMENT (IOC);  Surgeon: Jaye Fallow, MD;  Location: [facility];  Service: Ophthalmology;  Laterality: Left;  US  00:48 AP% 17.8 CDE 8.57 Fluid pack lot # [id number]   CATARACT EXTRACTION W/PHACO Right 04/11/2017   Procedure: CATARACT EXTRACTION PHACO AND INTRAOCULAR LENS PLACEMENT (IOC);  Surgeon: Jaye Fallow, MD;  Location: [facility];  Service: Ophthalmology;  Laterality: Right;  US  01:09.5 AP% 15.8 CDE 11.04 Fluid Pack lot # [id number]   CHOLECYSTECTOMY     COLON SURGERY     BOWEL OBSTRUCTION   COLONOSCOPY WITH PROPOFOL  N/A 04/25/2016   Procedure: COLONOSCOPY WITH PROPOFOL ;  Surgeon: Ruel Kung, MD;  Location: [facility];  Service: Endoscopy;   Laterality: N/A;   ESOPHAGOGASTRODUODENOSCOPY (EGD) WITH PROPOFOL  N/A 04/25/2016   Procedure: ESOPHAGOGASTRODUODENOSCOPY (EGD) WITH PROPOFOL ;  Surgeon: Ruel Kung, MD;  Location: [facility];  Service: Endoscopy;  Laterality: N/A;   EXPLORATORY LAPAROTOMY  10/18/2011   blockage small intestines   GIVENS CAPSULE STUDY N/A 05/11/2016   Procedure: GIVENS CAPSULE STUDY;  Surgeon: Ruel Kung, MD;  Location: [facility];  Service: Endoscopy;  Laterality: N/A;   Patient Active Problem List   Diagnosis Date Noted   Carotid stenosis 10/22/2023   Long-term current use of benzodiazepine 08/23/2023   Lymphedema 03/29/2023   Anemia due to stage 4 chronic kidney disease (HCC) 02/04/2023   Overweight (BMI 25.0-29.9) 08/05/2022   Vision loss 08/04/2022   Senile purpura (HCC) 06/16/2022   Insomnia 06/16/2022   Varicose veins of both lower extremities without ulcer or inflammation 06/16/2022   Mixed hyperlipidemia 06/12/2022   Occipital neuralgia 06/12/2022   Sensory ataxia 06/12/2022   Osteopenia of left forearm 06/12/2022   CMC arthritis 05/23/2022   CKD (chronic kidney disease) stage 4, GFR 15-29 ml/min (HCC) 03/03/2022   Hypothyroidism, adult 03/01/2022   Loss of memory 07/26/2021   Moderate pulmonary hypertension (HCC) 05/20/2021   Primary osteoarthritis involving multiple joints 03/24/2021   Unsteady gait 10/16/2019   History of stroke 03/04/2019   Bilateral foot pain 03/06/2018  Chronic bilateral low back pain 03/06/2018   Neck pain 03/06/2018   Neuropathy 02/06/2018   History of nonmelanoma skin cancer 09/27/2016   Essential hypertension 09/02/2016   Hiatal hernia    Stricture of esophagus    Iron  deficiency anemia, unspecified 03/23/2016   History of tachycardia 07/29/2015   PCP: [name], NP  REFERRING PROVIDER: Lynwood Hue, MD  REFERRING DIAG: Rt knee pain  THERAPY DIAG:  Abnormality of gait and mobility  Acute pain of right knee  Difficulty in walking, not  elsewhere classified  Muscle weakness (generalized)  Unsteadiness on feet  Other lack of coordination  Rationale for Evaluation and Treatment: Rehabilitation  ONSET DATE: June 2025  SUBJECTIVE:  SUBJECTIVE STATEMENT:  Pt reports she has not been using her cane much over the last week.  Pt notes that she has been utilizing it on the concrete, but otherwise is doing well.  Pt notes she had a coughing spell earlier.     PERTINENT HISTORY: 88yoF referred to OPPT for acute insidious Rt medial knee pain. Pt seen by Dr. Hue [facility], per pt he recommended PT to gain muscle and strength in this knee, but generally the joint looked quite favorable. Unrelated, pt reports significant debility and difficulty walking without limiting DOE since being hospitalized last year- this has created more of activity restriction than her knee pain alone.   PAIN:  Are you having pain? No pain today; knee ache and significant back pain last night.   PRECAUTIONS: None  WEIGHT BEARING RESTRICTIONS: No  FALLS:  Has patient fallen in last 6 months? Yes. Number of falls 1  LIVING ENVIRONMENT: Lives with: lives with their family and lives with their son Stairs: No Has following equipment at home: Single point cane and Environmental consultant - 2 wheeled  OCCUPATION: retired   PLOF: drives, AMB with SPC more recently or walker last year when she left [employer], history of falls/neuropathy.   PATIENT GOALS: resolution of knee pain   OBJECTIVE:  Note: Objective measures were completed at evaluation unless otherwise noted.  DIAGNOSTIC FINDINGS:   Recently seen by orthopedics. PATIENT SURVEYS:  Attempted LEFS and KOOD, neither of which were compatible with pt's self-report capacity.  COGNITION: Overall cognitive status: globally WNL, mentation somewhat limiting to ability to response to LEFS/KOOS in a meaningful way.   EDEMA:  None seen upon visual inspection  PALPATION: Rt knee extensor mechanism; not tender, no  focal or global edema/bogginess, healthy muscle mass JOINT RANGE:   ROM Right eval Left eval  Knee flexion >110*   Knee extension full full   *pt reports not limited, flexoin at baseline  FUNCTIONAL TESTS:  5xSTS: 20sec, hands free, no LOB, mild antalgia reps 4-5  432ft AMB  overground: SPC use, 8 minutes, 38 sec (11/16/23)   GAIT: Distance walked: 22 meters Assistive device utilized: SPC  Level of assistance: ModI  Comments: slow, appears to be limited more by baseline balance deficits  TODAY'S TREATMENT 01/09/24 :    TherAct: To improve functional movements patterns for everyday tasks  Neuromuscular Re-Ed: To facilitate reeducation of movement, balance, posture, coordination, and/or proprioception/kinesthetic sense.   Circuit style workout to focus on functional endurance: Bout 1:  Step up x 15 reps each LE   Standing calf raises x 15 reps  Ambulation in clinic x approx 350 feet, CGA, no AD Bout 2:  Dynamic hip march x30 alternating with BTB around the knees, in // bars with slight UE assistance for balance  Standing mini lunge walking in // bars  Ambulation in clinic x approx 350 feet, CGA, no AD (pt noting mild low back tightness)  Bout 3:  Sit to stand without UE x 15  Tandem walking forward/retro, in // bars, x8 each direction  Ambulation around the clinic, down EVS hallways, and past cafeteria (~500 ft) for cardiovascular endurance    PATIENT EDUCATION:  Education details: Pt educated throughout session about proper posture and technique with exercises. Improved exercise technique, movement at target joints, use of target muscles after min to mod verbal, visual, tactile cues. Person educated: Pt  Education method: collaborative learning, deliberate practice, positive reinforcement, explicit instruction, establish rules. Education  comprehension: seems ok; FU in future to determine LT comprehension/adherence   Benefits of ergonomic handle on cane for hand pain management at metacarpals.   HOME EXERCISE PROGRAM: Access Code: [email or web address] URL: [email or web address] Date: 11/14/2023 Prepared by: Peggye Linear  Exercises - Supine Knee Extension Strengthening  - 2 x daily - 1 sets - 15 reps - Supine Bridge  - 2 x daily - 1 sets - 10 reps (low range as not to aggravate chronic low back pain)  - Seated Long Arc Quad  - 2 x daily - 1 sets - 10 reps - Heel Raises with Counter Support  - 2 x daily - 1 sets - 15 reps  ASSESSMENT:  CLINICAL IMPRESSION:  Pt responded well to the circuit training and noted to have some shortness of breathe with the circuit training as expected.  Pt otherwise performed well with the increased level of activity during back to back items.  Pt received therapeutic rest breaks in between each circuit to allow for breathing to regulate.  Pt also received education regarding pursed lip breathing technique.  Pt has been consistent with her HEP and was advised to continue to do so in order to improve overall tolerance to exercise.   Pt will continue to benefit from skilled therapy to address remaining deficits in order to improve overall QoL and return to PLOF.        OBJECTIVE IMPAIRMENTS: Decreased knowledge of condition, decreased use of DME, decreased mobility, difficulty walking, decreased strength.  ACTIVITY LIMITATIONS: Lifting, standing, walking, squatting, transfers, locomotion level PARTICIPATION LIMITATIONS: Cleaning, laundry, interpersonal relationships, driving, yardwork, community activity.  PERSONAL FACTORS: Age, behavior pattern, education, past/current experiences, transportation, profession  are also affecting patient's functional outcome.  REHAB POTENTIAL: Good.  CLINICAL DECISION MAKING: moderate  EVALUATION COMPLEXITY: Stable/uncomplicated   GOALS: Goals reviewed  with patient? No  SHORT TERM GOALS: Target date: 12/15/23 Patient will report comprehension, confidence, and consistent compliance and of a simple home exercise program established to facilitate symptoms management and basic strengthening and/or segment mobility.  Baseline: issued at visit 1; 12/14/2023=Patient reports walking outside and doing most of the exercisses Goal status: MET  2.  Pt to demonstrate improvement in transfers power AEB 5xSTS<17seconds. Baseline: 5XSTS 20sec on visit 1; 12/14/2023: 13.98 sec without UE  support Goal status: MET  LONG TERM GOALS: Target date: 01/15/24 Pt to demonstrate ability to perform without pain limitation, less than 2/10 increase in NPRS, and with >10% improvement in distance to improve ability to participate in IADL and social events.   Baseline: pending assessment visit 2 ; 11/20/2023=440 feet with SPC and 550 feet with 4WW;  12/14/2023= 630 feet without UE support with report of only minimal low back pain reported during last 30 sec of walk.  01/04/24: 794' without UE support, 2/10 pain and 1 standing rest break Goal status: Progressing  2.  Patient to demonstrate improved performance on initial transfers assessment AEB improved reps per time or time per perform desired reps and/or reduced seat height and/or use of hands in order to improve safety, tolerance, and independence in ADL performance.   Baseline: visit 1 5xSTS: 20sec; 12/14/2023= 13.98 sec without UE support  01/04/24: 16.33 sec without UE support Goal status: PROGRESSING  3.  Pt to report ability to perform housework and light household activities ad lib without painful restrictions to these activities.  Baseline: limited by pain at eval; 12/14/2023= Patient reports doing household chores with some intermittent rest breaks- including washing clothes, dishes, sweeping, vacuuming.  01/04/24: Pt is able to do everything without any complications other than the L hand.  Back is much better. Goal  status: MET  4. Pt will improve BERG by at least 3 points in order to demonstrate clinically significant improvement in balance.  Baseline: 12/14/2023= 50/56  01/04/24: 52/56  Goal status: PROGRESSING  5.  Patient will improve with gait speed as seen by improved 10 MWT > 1.106m/s with no device vs. Cane for improved mobility and decreased risk of falling.  Baseline: 01/04/24: 0.85m/s Goal Status: NEW  PLAN:  PT FREQUENCY: 1-2x/week  PT DURATION: 8 weeks  PLANNED INTERVENTIONS: 97110-Therapeutic exercises, 97530- Therapeutic activity, 97112- Neuromuscular re-education, 97535- Self Care, [id number]- Manual therapy, G0283- Electrical stimulation (unattended), [phone]- Electrical stimulation (manual), Patient/Family education, Balance training, Stair training, Joint mobilization, Joint manipulation, Visual/preceptual remediation/compensation, DME instructions, Cryotherapy, and Moist heat  PLAN FOR NEXT SESSION:   Progressive balance interventions Progress LE strengthening  Progress gait speed activities.    Fonda Simpers, PT, DPT Physical Therapist - [facility]  01/11/24, 1:09 PM

## 2024-01-12 ENCOUNTER — Other Ambulatory Visit: Payer: Self-pay | Admitting: Nurse Practitioner

## 2024-01-12 NOTE — Telephone Encounter (Signed)
 Copied from CRM [id number]. Topic: Clinical - Medication Refill >> Jan 12, 2024  4:16 PM Nathanel BROCKS wrote: Medication: butalbital -acetaminophen -caffeine  (FIORICET ) 50-325-40 MG tablet   Has the patient contacted their pharmacy? Yes  This is the patient's preferred pharmacy:  [facility] - [place], [place] - [address] [place] [address] Phone: [phone] Fax: [phone]  Is this the correct pharmacy for this prescription? Yes If no, delete pharmacy and type the correct one.   Has the prescription been filled recently? Yes [id number]  Is the patient out of the medication? Yes  Has the patient been seen for an appointment in the last year OR does the patient have an upcoming appointment? Yes  Can we respond through MyChart? No  Agent: Please be advised that Rx refills may take up to 3 business days. We ask that you follow-up with your pharmacy.

## 2024-01-14 NOTE — Telephone Encounter (Signed)
 Requested medication (s) are due for refill today: yes  Requested medication (s) are on the active medication list: yes  Last refill:  11/22/23  Future visit scheduled: yes  Notes to clinic:  Unable to refill per protocol, cannot delegate.      Requested Prescriptions  Pending Prescriptions Disp Refills   butalbital -acetaminophen -caffeine  (FIORICET ) 50-325-40 MG tablet [Pharmacy Med Name: BUTALB-ACETAMIN-CAFF 50-325-40] 20 tablet 0    Sig: Take 1-2tablets by mouth every 6 (six) hours as needed for headache.     Not Delegated - Analgesics:  Non-Opioid Analgesic Combinations 2 Failed - 01/14/2024 11:42 AM      Failed - This refill cannot be delegated      Failed - Cr in normal range and within 360 days    Creatinine  Date Value Ref Range Status  02/13/2013 1.09 0.60 - 1.30 mg/dL Final   Creatinine, Ser  Date Value Ref Range Status  02/03/2023 1.20 (H) 0.57 - 1.00 mg/dL Final         Passed - eGFR is 10 or above and within 360 days    EGFR (African American)  Date Value Ref Range Status  02/13/2013 58 (L)  Final   GFR calc Af Amer  Date Value Ref Range Status  10/13/2019 52 (L) >60 mL/min Final   EGFR (Non-African Amer.)  Date Value Ref Range Status  02/13/2013 50 (L)  Final    Comment:    eGFR values <83mL/min/1.73 m2 may be an indication of chronic kidney disease (CKD). Calculated eGFR is useful in patients with stable renal function. The eGFR calculation will not be reliable in acutely ill patients when serum creatinine is changing rapidly. It is not useful in  patients on dialysis. The eGFR calculation may not be applicable to patients at the low and high extremes of body sizes, pregnant women, and vegetarians.    GFR, Estimated  Date Value Ref Range Status  12/04/2022 49 (L) >60 mL/min Final    Comment:    (NOTE) Calculated using the CKD-EPI Creatinine Equation (2021)    eGFR  Date Value Ref Range Status  02/03/2023 44 (L) >59 mL/min/1.73 Final          Passed - Patient is not pregnant      Passed - Valid encounter within last 12 months    Recent Outpatient Visits           2 months ago Essential hypertension   [place] [facility] [place], [place] T, NP   2 months ago Laryngitis   [place] [facility] Herold [place] SQUIBB, MD   2 months ago CKD (chronic kidney disease) stage 4, GFR 15-29 ml/min (HCC)   [place] [facility] [place], [place] T, NP   4 months ago Moderate pulmonary hypertension (HCC)   [place] [facility] [place], Melanie DASEN, NP

## 2024-01-15 NOTE — Therapy (Signed)
 OUTPATIENT PHYSICAL THERAPY TREATMENT  Patient Name: Tracie Fisher MRN: [id number] DOB:[birth year], 88 y.o., female Today's Date: 01/16/2024  END OF SESSION:  PT End of Session - 01/16/24 1111     Visit Number 19    Number of Visits 32    Date for PT Re-Evaluation 02/29/24    Authorization Type [employer] Medicare    Authorization Time Period 01/04/24-02/29/24    Progress Note Due on Visit 20    PT Start Time 1103    PT Stop Time 1146    PT Time Calculation (min) 43 min    Equipment Utilized During Treatment Gait belt    Activity Tolerance Patient tolerated treatment well;No increased pain    Behavior During Therapy WFL for tasks assessed/performed            Past Medical History:  Diagnosis Date   Cancer (HCC)    SKIN   Complication of anesthesia    Depression    Dysrhythmia    GERD (gastroesophageal reflux disease)    Hiatal hernia    HOH (hard of hearing)    Hypertension    Hypothyroidism    PONV (postoperative nausea and vomiting)    Thyroid  disease    Past Surgical History:  Procedure Laterality Date   APPENDECTOMY     BACK SURGERY     MULTIPLE/ROD IN BACK   BREAST CYST ASPIRATION Left years ago   CATARACT EXTRACTION W/PHACO Left 03/21/2017   Procedure: CATARACT EXTRACTION PHACO AND INTRAOCULAR LENS PLACEMENT (IOC);  Surgeon: Jaye Fallow, MD;  Location: [facility];  Service: Ophthalmology;  Laterality: Left;  US  00:48 AP% 17.8 CDE 8.57 Fluid pack lot # [id number]   CATARACT EXTRACTION W/PHACO Right 04/11/2017   Procedure: CATARACT EXTRACTION PHACO AND INTRAOCULAR LENS PLACEMENT (IOC);  Surgeon: Jaye Fallow, MD;  Location: [facility];  Service: Ophthalmology;  Laterality: Right;  US  01:09.5 AP% 15.8 CDE 11.04 Fluid Pack lot # [id number]   CHOLECYSTECTOMY     COLON SURGERY     BOWEL OBSTRUCTION   COLONOSCOPY WITH PROPOFOL  N/A 04/25/2016   Procedure: COLONOSCOPY WITH PROPOFOL ;  Surgeon: Ruel Kung, MD;  Location: [facility];  Service: Endoscopy;   Laterality: N/A;   ESOPHAGOGASTRODUODENOSCOPY (EGD) WITH PROPOFOL  N/A 04/25/2016   Procedure: ESOPHAGOGASTRODUODENOSCOPY (EGD) WITH PROPOFOL ;  Surgeon: Ruel Kung, MD;  Location: [facility];  Service: Endoscopy;  Laterality: N/A;   EXPLORATORY LAPAROTOMY  10/18/2011   blockage small intestines   GIVENS CAPSULE STUDY N/A 05/11/2016   Procedure: GIVENS CAPSULE STUDY;  Surgeon: Ruel Kung, MD;  Location: [facility];  Service: Endoscopy;  Laterality: N/A;   Patient Active Problem List   Diagnosis Date Noted   Carotid stenosis 10/22/2023   Long-term current use of benzodiazepine 08/23/2023   Lymphedema 03/29/2023   Anemia due to stage 4 chronic kidney disease (HCC) 02/04/2023   Overweight (BMI 25.0-29.9) 08/05/2022   Vision loss 08/04/2022   Senile purpura (HCC) 06/16/2022   Insomnia 06/16/2022   Varicose veins of both lower extremities without ulcer or inflammation 06/16/2022   Mixed hyperlipidemia 06/12/2022   Occipital neuralgia 06/12/2022   Sensory ataxia 06/12/2022   Osteopenia of left forearm 06/12/2022   CMC arthritis 05/23/2022   CKD (chronic kidney disease) stage 4, GFR 15-29 ml/min (HCC) 03/03/2022   Hypothyroidism, adult 03/01/2022   Loss of memory 07/26/2021   Moderate pulmonary hypertension (HCC) 05/20/2021   Primary osteoarthritis involving multiple joints 03/24/2021   Unsteady gait 10/16/2019   History of stroke 03/04/2019   Bilateral foot pain  03/06/2018   Chronic bilateral low back pain 03/06/2018   Neck pain 03/06/2018   Neuropathy 02/06/2018   History of nonmelanoma skin cancer 09/27/2016   Essential hypertension 09/02/2016   Hiatal hernia    Stricture of esophagus    Iron  deficiency anemia, unspecified 03/23/2016   History of tachycardia 07/29/2015   PCP: [name], NP  REFERRING PROVIDER: Lynwood Hue, MD  REFERRING DIAG: Rt knee pain  THERAPY DIAG:  Abnormality of gait and mobility  Acute pain of right knee  Difficulty in walking, not  elsewhere classified  Muscle weakness (generalized)  Unsteadiness on feet  Other lack of coordination  Rationale for Evaluation and Treatment: Rehabilitation  ONSET DATE: June 2025  SUBJECTIVE:  SUBJECTIVE STATEMENT:  Pt reports has been walking without using her cane. Reports going back to [place] on 01/24/2024 for her persistent cough.   PERTINENT HISTORY: 88yoF referred to OPPT for acute insidious Rt medial knee pain. Pt seen by Dr. Hue [facility], per pt he recommended PT to gain muscle and strength in this knee, but generally the joint looked quite favorable. Unrelated, pt reports significant debility and difficulty walking without limiting DOE since being hospitalized last year- this has created more of activity restriction than her knee pain alone.   PAIN:  Are you having pain? No pain today; knee ache and significant back pain last night.   PRECAUTIONS: None  WEIGHT BEARING RESTRICTIONS: No  FALLS:  Has patient fallen in last 6 months? Yes. Number of falls 1  LIVING ENVIRONMENT: Lives with: lives with their family and lives with their son Stairs: No Has following equipment at home: Single point cane and Environmental consultant - 2 wheeled  OCCUPATION: retired   PLOF: drives, AMB with SPC more recently or walker last year when she left [facility], history of falls/neuropathy.   PATIENT GOALS: resolution of knee pain   OBJECTIVE:  Note: Objective measures were completed at evaluation unless otherwise noted.  DIAGNOSTIC FINDINGS:   Recently seen by orthopedics. PATIENT SURVEYS:  Attempted LEFS and KOOD, neither of which were compatible with pt's self-report capacity.  COGNITION: Overall cognitive status: globally WNL, mentation somewhat limiting to ability to response to LEFS/KOOS in a meaningful way.   EDEMA:  None seen upon visual inspection  PALPATION: Rt knee extensor mechanism; not tender, no focal or global edema/bogginess, healthy muscle mass JOINT RANGE:   ROM  Right eval Left eval  Knee flexion >110*   Knee extension full full   *pt reports not limited, flexoin at baseline  FUNCTIONAL TESTS:  5xSTS: 20sec, hands free, no LOB, mild antalgia reps 4-5  476ft AMB  overground: SPC use, 8 minutes, 38 sec (11/16/23)   GAIT: Distance walked: 22 meters Assistive device utilized: SPC  Level of assistance: ModI  Comments: slow, appears to be limited more by baseline balance deficits  TODAY'S TREATMENT : 01/16/24      Neuromuscular Re-Ed: To facilitate reeducation of movement, balance, posture, coordination, and/or proprioception/kinesthetic sense.   Obstacles in // bars- 1/2 foam roll, 5 step bloc, airex pad and another 1/2 foam- stepping and walking over the following x 10 - down and back  TA:   Circuit style workout to focus on functional endurance:  Bout 1: Sit to stand Holding onto rainbow colored ball then UE raise along with heel raise to reach overhead as high as possible x 15 reps  Ambulation in hospital x 500 feet, 3# AW ea LE, CGA- some fatigue.   Bout 2:  Side Step walking up/over 1/2 foam, airex pad and another 1/2 foam down and back x 10  Standing calf raises with min to no UE support x 15 reps Ambulation in clinic x approx 350 feet, with 3# AWand CGA, no AD  Bout 3:  Dynamic step fwd/bwd up/over 1/2 foam roll in // bars x 15 each LE with 3# AW  Seated knee ext 3# AW x 12 with 2-3 sec hold   Ambulation in clinic x approx 350 feet, CGA, no AD   Bout 4:  Sit to stand without UE x 15  Tandem walking forward/retro, in // bars, x10 each direction  SLS - hold as long as possible- between 2-8 sec each LE x   Several trials.      PATIENT EDUCATION:  Education details: Pt educated throughout session about proper posture and technique with exercises. Improved exercise technique, movement at target joints,  use of target muscles after min to mod verbal, visual, tactile cues. Person educated: Pt  Education method: collaborative learning, deliberate practice, positive reinforcement, explicit instruction, establish rules. Education comprehension: seems ok; FU in future to determine LT comprehension/adherence   Benefits of ergonomic handle on cane for hand pain management at metacarpals.   HOME EXERCISE PROGRAM: Access Code: [email or web address] URL: [email or web address] Date: 11/14/2023 Prepared by: Peggye Linear  Exercises - Supine Knee Extension Strengthening  - 2 x daily - 1 sets - 15 reps - Supine Bridge  - 2 x daily - 1 sets - 10 reps (low range as not to aggravate chronic low back pain)  - Seated Long Arc Quad  - 2 x daily - 1 sets - 10 reps - Heel Raises with Counter Support  - 2 x daily - 1 sets - 15 reps  ASSESSMENT:  CLINICAL IMPRESSION:  Treatment continued per plan of care focusing on LE strengthening and functional endurance. Patient continues to have some SOB with exertion but able to complete all activities with brief rest. She is responding well to circuit style workout overall. She was able to complete with some ankle weight resistance today and denied any low back tightness throughout session.   Pt will continue to benefit from skilled therapy to address remaining deficits in order to improve overall QoL and return to PLOF.        OBJECTIVE IMPAIRMENTS: Decreased knowledge of condition, decreased use of DME, decreased mobility, difficulty walking, decreased strength.  ACTIVITY LIMITATIONS: Lifting, standing, walking, squatting, transfers, locomotion level PARTICIPATION LIMITATIONS: Cleaning, laundry, interpersonal relationships, driving, yardwork, community activity.  PERSONAL FACTORS: Age, behavior pattern, education, past/current experiences, transportation, profession  are also affecting patient's functional outcome.  REHAB POTENTIAL: Good.  CLINICAL DECISION  MAKING: moderate  EVALUATION COMPLEXITY: Stable/uncomplicated   GOALS: Goals reviewed with patient? No  SHORT TERM GOALS: Target date: 12/15/23 Patient will report comprehension, confidence, and consistent compliance and  of a simple home exercise program established to facilitate symptoms management and basic strengthening and/or segment mobility.  Baseline: issued at visit 1; 12/14/2023=Patient reports walking outside and doing most of the exercisses Goal status: MET  2.  Pt to demonstrate improvement in transfers power AEB 5xSTS<17seconds. Baseline: 5XSTS 20sec on visit 1; 12/14/2023: 13.98 sec without UE support Goal status: MET  LONG TERM GOALS: Target date: 01/15/24 Pt to demonstrate ability to perform without pain limitation, less than 2/10 increase in NPRS, and with >10% improvement in distance to improve ability to participate in IADL and social events.   Baseline: pending assessment visit 2 ; 11/20/2023=440 feet with SPC and 550 feet with 4WW;  12/14/2023= 630 feet without UE support with report of only minimal low back pain reported during last 30 sec of walk.  01/04/24: 794' without UE support, 2/10 pain and 1 standing rest break Goal status: Progressing  2.  Patient to demonstrate improved performance on initial transfers assessment AEB improved reps per time or time per perform desired reps and/or reduced seat height and/or use of hands in order to improve safety, tolerance, and independence in ADL performance.   Baseline: visit 1 5xSTS: 20sec; 12/14/2023= 13.98 sec without UE support  01/04/24: 16.33 sec without UE support Goal status: PROGRESSING  3.  Pt to report ability to perform housework and light household activities ad lib without painful restrictions to these activities.  Baseline: limited by pain at eval; 12/14/2023= Patient reports doing household chores with some intermittent rest breaks- including washing clothes, dishes, sweeping, vacuuming.  01/04/24: Pt is able to  do everything without any complications other than the L hand.  Back is much better. Goal status: MET  4. Pt will improve BERG by at least 3 points in order to demonstrate clinically significant improvement in balance.  Baseline: 12/14/2023= 50/56  01/04/24: 52/56  Goal status: PROGRESSING  5.  Patient will improve with gait speed as seen by improved 10 MWT > 1.88m/s with no device vs. Cane for improved mobility and decreased risk of falling.  Baseline: 01/04/24: 0.13m/s Goal Status: NEW  PLAN:  PT FREQUENCY: 1-2x/week  PT DURATION: 8 weeks  PLANNED INTERVENTIONS: 97110-Therapeutic exercises, 97530- Therapeutic activity, 97112- Neuromuscular re-education, 97535- Self Care, [id number]- Manual therapy, G0283- Electrical stimulation (unattended), [phone]- Electrical stimulation (manual), Patient/Family education, Balance training, Stair training, Joint mobilization, Joint manipulation, Visual/preceptual remediation/compensation, DME instructions, Cryotherapy, and Moist heat  PLAN FOR NEXT SESSION:   Progressive balance interventions Progress LE strengthening  Progress gait speed activities.    Chyrl London, PT Physical Therapist - [facility]  01/16/24, 1:21 PM

## 2024-01-15 NOTE — Telephone Encounter (Signed)
 Requested medication (s) are due for refill today - yes  Requested medication (s) are on the active medication list -yes  Future visit scheduled -yes  Last refill: 11/22/23 #20  Notes to clinic: non delegated Rx  Requested Prescriptions  Pending Prescriptions Disp Refills   butalbital -acetaminophen -caffeine  (FIORICET ) 50-325-40 MG tablet 20 tablet 0    Sig: Take 1-2 tablets by mouth every 6 (six) hours as needed for headache.     Not Delegated - Analgesics:  Non-Opioid Analgesic Combinations 2 Failed - 01/15/2024  6:38 AM      Failed - This refill cannot be delegated      Failed - Cr in normal range and within 360 days    Creatinine  Date Value Ref Range Status  02/13/2013 1.09 0.60 - 1.30 mg/dL Final   Creatinine, Ser  Date Value Ref Range Status  02/03/2023 1.20 (H) 0.57 - 1.00 mg/dL Final         Passed - eGFR is 10 or above and within 360 days    EGFR (African American)  Date Value Ref Range Status  02/13/2013 58 (L)  Final   GFR calc Af Amer  Date Value Ref Range Status  10/13/2019 52 (L) >60 mL/min Final   EGFR (Non-African Amer.)  Date Value Ref Range Status  02/13/2013 50 (L)  Final    Comment:    eGFR values <51mL/min/1.73 m2 may be an indication of chronic kidney disease (CKD). Calculated eGFR is useful in patients with stable renal function. The eGFR calculation will not be reliable in acutely ill patients when serum creatinine is changing rapidly. It is not useful in  patients on dialysis. The eGFR calculation may not be applicable to patients at the low and high extremes of body sizes, pregnant women, and vegetarians.    GFR, Estimated  Date Value Ref Range Status  12/04/2022 49 (L) >60 mL/min Final    Comment:    (NOTE) Calculated using the CKD-EPI Creatinine Equation (2021)    eGFR  Date Value Ref Range Status  02/03/2023 44 (L) >59 mL/min/1.73 Final         Passed - Patient is not pregnant      Passed - Valid encounter within last 12  months    Recent Outpatient Visits           2 months ago Essential hypertension   [place] [facility] [place], [place] T, NP   2 months ago Laryngitis   [place] [facility] Herold Hadassah SQUIBB, MD   2 months ago CKD (chronic kidney disease) stage 4, GFR 15-29 ml/min (HCC)   [place] [facility] [place], [place] T, NP   4 months ago Moderate pulmonary hypertension (HCC)   [facility] [place], Melanie T, NP                 Requested Prescriptions  Pending Prescriptions Disp Refills   butalbital -acetaminophen -caffeine  (FIORICET ) 50-325-40 MG tablet 20 tablet 0    Sig: Take 1-2 tablets by mouth every 6 (six) hours as needed for headache.     Not Delegated - Analgesics:  Non-Opioid Analgesic Combinations 2 Failed - 01/15/2024  6:38 AM      Failed - This refill cannot be delegated      Failed - Cr in normal range and within 360 days    Creatinine  Date Value Ref Range Status  02/13/2013 1.09 0.60 - 1.30 mg/dL Final   Creatinine, Ser  Date  Value Ref Range Status  02/03/2023 1.20 (H) 0.57 - 1.00 mg/dL Final         Passed - eGFR is 10 or above and within 360 days    EGFR (African American)  Date Value Ref Range Status  02/13/2013 58 (L)  Final   GFR calc Af Amer  Date Value Ref Range Status  10/13/2019 52 (L) >60 mL/min Final   EGFR (Non-African Amer.)  Date Value Ref Range Status  02/13/2013 50 (L)  Final    Comment:    eGFR values <27mL/min/1.73 m2 may be an indication of chronic kidney disease (CKD). Calculated eGFR is useful in patients with stable renal function. The eGFR calculation will not be reliable in acutely ill patients when serum creatinine is changing rapidly. It is not useful in  patients on dialysis. The eGFR calculation may not be applicable to patients at the low and high extremes of body sizes, pregnant women, and vegetarians.    GFR, Estimated  Date Value Ref Range Status   12/04/2022 49 (L) >60 mL/min Final    Comment:    (NOTE) Calculated using the CKD-EPI Creatinine Equation (2021)    eGFR  Date Value Ref Range Status  02/03/2023 44 (L) >59 mL/min/1.73 Final         Passed - Patient is not pregnant      Passed - Valid encounter within last 12 months    Recent Outpatient Visits           2 months ago Essential hypertension   [place] [facility] [place], [place] T, NP   2 months ago Laryngitis   [facility] Herold Hadassah SQUIBB, MD   2 months ago CKD (chronic kidney disease) stage 4, GFR 15-29 ml/min (HCC)   [place] [facility] [place], [place] T, NP   4 months ago Moderate pulmonary hypertension (HCC)   [place] [facility] [place], Melanie DASEN, NP

## 2024-01-16 ENCOUNTER — Ambulatory Visit: Attending: Orthopedic Surgery

## 2024-01-16 DIAGNOSIS — M6281 Muscle weakness (generalized): Secondary | ICD-10-CM | POA: Diagnosis not present

## 2024-01-16 DIAGNOSIS — R2681 Unsteadiness on feet: Secondary | ICD-10-CM | POA: Diagnosis not present

## 2024-01-16 DIAGNOSIS — M25561 Pain in right knee: Secondary | ICD-10-CM | POA: Insufficient documentation

## 2024-01-16 DIAGNOSIS — R262 Difficulty in walking, not elsewhere classified: Secondary | ICD-10-CM | POA: Diagnosis not present

## 2024-01-16 DIAGNOSIS — R278 Other lack of coordination: Secondary | ICD-10-CM | POA: Insufficient documentation

## 2024-01-16 DIAGNOSIS — R269 Unspecified abnormalities of gait and mobility: Secondary | ICD-10-CM | POA: Insufficient documentation

## 2024-01-16 MED ORDER — BUTALBITAL-APAP-CAFFEINE 50-325-40 MG PO TABS: 1.0000 | ORAL_TABLET | Freq: Four times a day (QID) | ORAL | 0 refills | Status: AC | PRN

## 2024-01-18 ENCOUNTER — Ambulatory Visit

## 2024-01-18 DIAGNOSIS — R278 Other lack of coordination: Secondary | ICD-10-CM

## 2024-01-18 DIAGNOSIS — R262 Difficulty in walking, not elsewhere classified: Secondary | ICD-10-CM | POA: Diagnosis not present

## 2024-01-18 DIAGNOSIS — R2681 Unsteadiness on feet: Secondary | ICD-10-CM

## 2024-01-18 DIAGNOSIS — M6281 Muscle weakness (generalized): Secondary | ICD-10-CM | POA: Diagnosis not present

## 2024-01-18 DIAGNOSIS — R269 Unspecified abnormalities of gait and mobility: Secondary | ICD-10-CM

## 2024-01-18 DIAGNOSIS — M25561 Pain in right knee: Secondary | ICD-10-CM | POA: Diagnosis not present

## 2024-01-18 NOTE — Therapy (Signed)
 OUTPATIENT PHYSICAL THERAPY TREATMENT/PHYSICAL THERAPY PROGRESS NOTE   Dates of reporting period  12/14/23   to   01/18/24   Patient Name: Tracie Fisher MRN: [id number] DOB:[birth year], 88 y.o., female Today's Date: 01/18/2024  END OF SESSION:  PT End of Session - 01/18/24 1106     Visit Number 20    Number of Visits 32    Date for PT Re-Evaluation 02/29/24    Authorization Type [employer]    Authorization Time Period 01/04/24-02/29/24    Progress Note Due on Visit 20    PT Start Time 1106    PT Stop Time 1145    PT Time Calculation (min) 39 min    Equipment Utilized During Treatment Gait belt    Activity Tolerance Patient tolerated treatment well;No increased pain    Behavior During Therapy WFL for tasks assessed/performed          Past Medical History:  Diagnosis Date   Cancer (HCC)    SKIN   Complication of anesthesia    Depression    Dysrhythmia    GERD (gastroesophageal reflux disease)    Hiatal hernia    HOH (hard of hearing)    Hypertension    Hypothyroidism    PONV (postoperative nausea and vomiting)    Thyroid  disease    Past Surgical History:  Procedure Laterality Date   APPENDECTOMY     BACK SURGERY     MULTIPLE/ROD IN BACK   BREAST CYST ASPIRATION Left years ago   CATARACT EXTRACTION W/PHACO Left 03/21/2017   Procedure: CATARACT EXTRACTION PHACO AND INTRAOCULAR LENS PLACEMENT (IOC);  Surgeon: Jaye Fallow, MD;  Location: [facility];  Service: Ophthalmology;  Laterality: Left;  US  00:48 AP% 17.8 CDE 8.57 Fluid pack lot # [id number]   CATARACT EXTRACTION W/PHACO Right 04/11/2017   Procedure: CATARACT EXTRACTION PHACO AND INTRAOCULAR LENS PLACEMENT (IOC);  Surgeon: Jaye Fallow, MD;  Location: [facility];  Service: Ophthalmology;  Laterality: Right;  US  01:09.5 AP% 15.8 CDE 11.04 Fluid Pack lot # [id number]   CHOLECYSTECTOMY     COLON SURGERY     BOWEL OBSTRUCTION   COLONOSCOPY WITH PROPOFOL  N/A 04/25/2016   Procedure: COLONOSCOPY WITH  PROPOFOL ;  Surgeon: Ruel Kung, MD;  Location: [facility];  Service: Endoscopy;  Laterality: N/A;   ESOPHAGOGASTRODUODENOSCOPY (EGD) WITH PROPOFOL  N/A 04/25/2016   Procedure: ESOPHAGOGASTRODUODENOSCOPY (EGD) WITH PROPOFOL ;  Surgeon: Ruel Kung, MD;  Location: [facility];  Service: Endoscopy;  Laterality: N/A;   EXPLORATORY LAPAROTOMY  10/18/2011   blockage small intestines   GIVENS CAPSULE STUDY N/A 05/11/2016   Procedure: GIVENS CAPSULE STUDY;  Surgeon: Ruel Kung, MD;  Location: [facility];  Service: Endoscopy;  Laterality: N/A;   Patient Active Problem List   Diagnosis Date Noted   Carotid stenosis 10/22/2023   Long-term current use of benzodiazepine 08/23/2023   Lymphedema 03/29/2023   Anemia due to stage 4 chronic kidney disease (HCC) 02/04/2023   Overweight (BMI 25.0-29.9) 08/05/2022   Vision loss 08/04/2022   Senile purpura (HCC) 06/16/2022   Insomnia 06/16/2022   Varicose veins of both lower extremities without ulcer or inflammation 06/16/2022   Mixed hyperlipidemia 06/12/2022   Occipital neuralgia 06/12/2022   Sensory ataxia 06/12/2022   Osteopenia of left forearm 06/12/2022   CMC arthritis 05/23/2022   CKD (chronic kidney disease) stage 4, GFR 15-29 ml/min (HCC) 03/03/2022   Hypothyroidism, adult 03/01/2022   Loss of memory 07/26/2021   Moderate pulmonary hypertension (HCC) 05/20/2021   Primary osteoarthritis involving multiple joints 03/24/2021  Unsteady gait 10/16/2019   History of stroke 03/04/2019   Bilateral foot pain 03/06/2018   Chronic bilateral low back pain 03/06/2018   Neck pain 03/06/2018   Neuropathy 02/06/2018   History of nonmelanoma skin cancer 09/27/2016   Essential hypertension 09/02/2016   Hiatal hernia    Stricture of esophagus    Iron  deficiency anemia, unspecified 03/23/2016   History of tachycardia 07/29/2015   PCP: [name], NP  REFERRING PROVIDER: Lynwood Hue, MD  REFERRING DIAG: Rt knee pain  THERAPY DIAG:   Abnormality of gait and mobility  Acute pain of right knee  Difficulty in walking, not elsewhere classified  Muscle weakness (generalized)  Unsteadiness on feet  Other lack of coordination  Rationale for Evaluation and Treatment: Rehabilitation  ONSET DATE: June 2025  SUBJECTIVE:  SUBJECTIVE STATEMENT:  Pt reports she is a little winded today and has been having headaches lately.  Pt reports she is being seen by PCP tomorrow and will hopefully get a referral for the headaches she continues to have even after the injections.  PERTINENT HISTORY: 88yoF referred to OPPT for acute insidious Rt medial knee pain. Pt seen by Dr. Hue [facility], per pt he recommended PT to gain muscle and strength in this knee, but generally the joint looked quite favorable. Unrelated, pt reports significant debility and difficulty walking without limiting DOE since being hospitalized last year- this has created more of activity restriction than her knee pain alone.   PAIN:  Are you having pain? No pain today; knee ache and significant back pain last night.   PRECAUTIONS: None  WEIGHT BEARING RESTRICTIONS: No  FALLS:  Has patient fallen in last 6 months? Yes. Number of falls 1  LIVING ENVIRONMENT: Lives with: lives with their family and lives with their son Stairs: No Has following equipment at home: Single point cane and Environmental consultant - 2 wheeled  OCCUPATION: retired   PLOF: drives, AMB with SPC more recently or walker last year when she left [facility], history of falls/neuropathy.   PATIENT GOALS: resolution of knee pain   OBJECTIVE:  Note: Objective measures were completed at evaluation unless otherwise noted.  DIAGNOSTIC FINDINGS:   Recently seen by orthopedics. PATIENT SURVEYS:  Attempted LEFS and KOOD, neither of which were compatible with pt's self-report capacity.  COGNITION: Overall cognitive status: globally WNL, mentation somewhat limiting to ability to response to LEFS/KOOS in a  meaningful way.   EDEMA:  None seen upon visual inspection  PALPATION: Rt knee extensor mechanism; not tender, no focal or global edema/bogginess, healthy muscle mass JOINT RANGE:   ROM Right eval Left eval  Knee flexion >110*   Knee extension full full   *pt reports not limited, flexoin at baseline  FUNCTIONAL TESTS:  5xSTS: 20sec, hands free, no LOB, mild antalgia reps 4-5  473ft AMB  overground: SPC use, 8 minutes, 38 sec (11/16/23)   GAIT: Distance walked: 22 meters Assistive device utilized: SPC  Level of assistance: ModI  Comments: slow, appears to be limited more by baseline balance deficits  TODAY'S TREATMENT : 01/18/24     Physical Performance Testing:  6 Min Walk Test:  Instructed patient to ambulate as quickly and as safely as possible for 6 minutes using LRAD. Patient was allowed to take standing rest breaks without stopping the test, but if the patient required a sitting rest break the clock would be stopped and the test would be over.  Results: 776 feet (236 m) without AD and Supervision/CGA. Results indicate that the patient has reduced endurance with ambulation compared to age matched norms.  Age Matched Norms (in meters): 56-69 yo M: 36 F: 28, 43-79 yo M: 69 F: 471, 22-89 yo M: 417 F: 392 MDC: 58.21 meters (190.98 feet) or 50 meters (ANPTA Core Set of Outcome Measures for Adults with Neurologic Conditions, 2018)  10 Meter Walk Test: Patient instructed to walk 10 meters (32.8 ft) as quickly and as safely as possible at their normal speed Results: 0.97 m/s (10.97 seconds)  Cut off scores:   Household Ambulator  < 0.4 m/s  Limited Community Ambulator  0.4 - 0.8 m/s  [employer]  > 0.8 m/s  Increased fall risk  < 1.29m/s  Crossing a Street  >1.35m/s  MCID 0.05 m/s (small), 0.13 m/s (moderate), 0.06 m/s (significant)  (ANPTA  Core Set of Outcome Measures for Adults with Neurologic Conditions, 2018)      TherAct: To improve functional movements patterns for everyday tasks:   Circuit style workout to focus on functional endurance:  Ambulation in hospital x 450 feet, 3# AW ea LE, CGA- some fatigue.  Seated marches 3# AW donned, 2x10 STS x10 Seated LAQ, 3# AW donned, 2x10       PATIENT EDUCATION:  Education details: Pt educated throughout session about proper posture and technique with exercises. Improved exercise technique, movement at target joints, use of target muscles after min to mod verbal, visual, tactile cues. Person educated: Pt  Education method: collaborative learning, deliberate practice, positive reinforcement, explicit instruction, establish rules. Education comprehension: seems ok; FU in future to determine LT comprehension/adherence   Benefits of ergonomic handle on cane for hand pain management at metacarpals.   HOME EXERCISE PROGRAM: Access Code: [email or web address] URL: [email or web address] Date: 11/14/2023 Prepared by: Peggye Linear  Exercises - Supine Knee Extension Strengthening  - 2 x daily - 1 sets - 15 reps - Supine Bridge  - 2 x daily - 1 sets - 10 reps (low range as not to aggravate chronic low back pain)  - Seated Long Arc Quad  - 2 x daily - 1 sets - 10 reps - Heel Raises with Counter Support  - 2 x daily - 1 sets - 15 reps  ASSESSMENT:  CLINICAL IMPRESSION:  Pt performed well with the exercises and goal assessment.  Pt progressed well with the , however had a reduction in 6 minute walk test due to the increased difficulty she experienced with breathing.  Pt was assessed at the conclusion of the test and found to have SpO2 levels dropping to 87% at the lowest.  Pt is having a follow up with PCP tomorrow and NP notified via secure chat of the finding.  Pt is somewhat concerned with her inability to breathe effectively at times.  Pt otherwise is doing well with  her strength.  Patient's condition has the potential to improve in response to therapy. Maximum improvement is yet to be obtained. The anticipated improvement is attainable and reasonable in a generally predictable time.   Pt will continue to benefit from  skilled therapy to address remaining deficits in order to improve overall QoL and return to PLOF.         OBJECTIVE IMPAIRMENTS: Decreased knowledge of condition, decreased use of DME, decreased mobility, difficulty walking, decreased strength.  ACTIVITY LIMITATIONS: Lifting, standing, walking, squatting, transfers, locomotion level PARTICIPATION LIMITATIONS: Cleaning, laundry, interpersonal relationships, driving, yardwork, community activity.  PERSONAL FACTORS: Age, behavior pattern, education, past/current experiences, transportation, profession  are also affecting patient's functional outcome.  REHAB POTENTIAL: Good.  CLINICAL DECISION MAKING: moderate  EVALUATION COMPLEXITY: Stable/uncomplicated   GOALS: Goals reviewed with patient? No  SHORT TERM GOALS: Target date: 12/15/23 Patient will report comprehension, confidence, and consistent compliance and of a simple home exercise program established to facilitate symptoms management and basic strengthening and/or segment mobility.   Baseline: issued at visit 1; 12/14/2023=Patient reports walking outside and doing most of the exercisses Goal status: MET  2.  Pt to demonstrate improvement in transfers power AEB 5xSTS<17seconds.  Baseline: 5XSTS 20sec on visit 1; 12/14/2023: 13.98 sec without UE support Goal status: MET  LONG TERM GOALS: Target date: 01/15/24 Pt to demonstrate ability to perform without pain limitation, less than 2/10 increase in NPRS, and with >10% improvement in distance to improve ability to participate in IADL and social events.    Baseline: pending assessment visit 2 ; 11/20/2023=440 feet with SPC and 550 feet with 4WW;  12/14/2023= 630 feet without UE support with  report of only minimal low back pain reported during last 30 sec of walk.  01/04/24: 794' without UE support, 2/10 pain and 1 standing rest break 01/18/24: 776' without UE support, no pain, and no rest breaks Goal status: Progressing  2.  Patient to demonstrate improved performance on initial transfers assessment AEB improved reps per time or time per perform desired reps and/or reduced seat height and/or use of hands in order to improve safety, tolerance, and independence in ADL performance.    Baseline: visit 1 5xSTS: 20sec; 12/14/2023= 13.98 sec without UE support  01/04/24: 16.33 sec without UE support 01/18/24: 13.97 sec without UE Goal status: PROGRESSING  3.  Pt to report ability to perform housework and light household activities ad lib without painful restrictions to these activities.   Baseline: limited by pain at eval; 12/14/2023= Patient reports doing household chores with some intermittent rest breaks- including washing clothes, dishes, sweeping, vacuuming.  01/04/24: Pt is able to do everything without any complications other than the L hand.  Back is much better. Goal status: MET  4. Pt will improve BERG by at least 3 points in order to demonstrate clinically significant improvement in balance.  Baseline: 12/14/2023= 50/56 01/04/24: 52/56 Goal status: PROGRESSING  5.  Patient will improve with gait speed as seen by improved 10 MWT > 1.33m/s with no device vs. Cane for improved mobility and decreased risk of falling.   Baseline: 01/04/24: 0.88m/s 01/18/24: 0.97 m/s Goal Status: PROGRESSING  PLAN:  PT FREQUENCY: 1-2x/week  PT DURATION: 8 weeks  PLANNED INTERVENTIONS: 97110-Therapeutic exercises, 97530- Therapeutic activity, 97112- Neuromuscular re-education, 97535- Self Care, [id number]- Manual therapy, G0283- Electrical stimulation (unattended), [phone]- Electrical stimulation (manual), Patient/Family education, Balance training, Stair training, Joint mobilization, Joint manipulation,  Visual/preceptual remediation/compensation, DME instructions, Cryotherapy, and Moist heat  PLAN FOR NEXT SESSION:   Progressive balance interventions Progress LE strengthening  Progress gait speed activities.    Fonda Simpers, PT, DPT Physical Therapist - [facility]  01/18/24, 6:30 PM

## 2024-01-19 ENCOUNTER — Ambulatory Visit (INDEPENDENT_AMBULATORY_CARE_PROVIDER_SITE_OTHER): Admitting: Nurse Practitioner

## 2024-01-19 ENCOUNTER — Encounter: Payer: Self-pay | Admitting: Nurse Practitioner

## 2024-01-19 VITALS — BP 139/77 | HR 61 | Temp 98.4°F | Resp 16 | Ht 60.0 in | Wt 143.8 lb

## 2024-01-19 DIAGNOSIS — N184 Chronic kidney disease, stage 4 (severe): Secondary | ICD-10-CM | POA: Diagnosis not present

## 2024-01-19 DIAGNOSIS — R052 Subacute cough: Secondary | ICD-10-CM | POA: Diagnosis not present

## 2024-01-19 DIAGNOSIS — F5101 Primary insomnia: Secondary | ICD-10-CM

## 2024-01-19 DIAGNOSIS — D692 Other nonthrombocytopenic purpura: Secondary | ICD-10-CM | POA: Diagnosis not present

## 2024-01-19 DIAGNOSIS — D631 Anemia in chronic kidney disease: Secondary | ICD-10-CM | POA: Diagnosis not present

## 2024-01-19 DIAGNOSIS — E782 Mixed hyperlipidemia: Secondary | ICD-10-CM | POA: Diagnosis not present

## 2024-01-19 DIAGNOSIS — I272 Pulmonary hypertension, unspecified: Secondary | ICD-10-CM | POA: Diagnosis not present

## 2024-01-19 DIAGNOSIS — M5481 Occipital neuralgia: Secondary | ICD-10-CM

## 2024-01-19 DIAGNOSIS — I10 Essential (primary) hypertension: Secondary | ICD-10-CM | POA: Diagnosis not present

## 2024-01-19 MED ORDER — ALPRAZOLAM 1 MG PO TABS: ORAL_TABLET | ORAL | 3 refills | Status: DC

## 2024-01-19 MED ORDER — PANTOPRAZOLE SODIUM 20 MG PO TBEC: 20.0000 mg | DELAYED_RELEASE_TABLET | Freq: Every day | ORAL | 3 refills | Status: AC

## 2024-01-19 NOTE — Assessment & Plan Note (Signed)
 Chronic, ongoing for years.  Previous PCP, Dr. Arlana Pouch, placed her on Xanax many years ago and she still takes, reports she cannot sleep without it.  Refuses to try anything else. At this time will continue current regimen and adjust if possible.  Aware of need for every 3 month visits. Controlled substance agreement at next visit and UDS up to date (due next 08/22/24).  Return in 3 months.

## 2024-01-19 NOTE — Progress Notes (Signed)
 BP 139/77 (BP Location: Left Arm, Patient Position: Sitting, Cuff Size: Normal)   Pulse 61   Temp 98.4 F (36.9 C) (Oral)   Resp 16   Ht 5' (1.524 m)   Wt 143 lb 12.8 oz (65.2 kg)   SpO2 98%   BMI 28.08 kg/m    Subjective:    Patient ID: Tracie Fisher, female    DOB: 1935-07-06, 88 y.o.   MRN: [id number]  HPI: CECILLIA Fisher is a 88 y.o. female  Chief Complaint  Patient presents with   Cough    About 2 months. Usually feels congestion on the left side of her throat and that gets her to start coughing.    Housing accommadation    Needs a letter stating that she has a disability that would allow for her apartment complex to consider installing a walk in shower.    HYPERTENSION / HYPERLIPIDEMIA Taking Plavix , Zaroxolyn, Pravastatin , ,Torsemide .  Saw cardiology last on 11/28/23 with no changes. Satisfied with current treatment? yes Duration of hypertension: chronic BP monitoring frequency: not checking BP range:  BP medication side effects: no Duration of hyperlipidemia: chronic Cholesterol medication side effects: no Cholesterol supplements: none Medication compliance: good compliance Aspirin: no Recent stressors: no Recurrent headaches: no Visual changes: no Palpitations: no Dyspnea: no Chest pain: no Lower extremity edema: occasional Dizzy/lightheaded: no   CHRONIC KIDNEY DISEASE Saw Dr. Dennise on 12/13/23. CRT 1.29, eGFR 40, BUN 29, H/H 13.2/ 40.2, PTH 160. CKD status: stable Medications renally dose: yes Previous renal evaluation: no Pneumovax:  Up to Date Influenza Vaccine:  Up to Date   COUGH Reports cough being present for 2 months + congestion.  Notices it more left side of throat. Irritation to throat may last 1/2 a day and then go away.  Her physical therapist wrote to PCP via secure chat yesterday and he reported with activity her sats went to 87 range and then with rest return to 90's. No history of allergies.  Echo 08/05/22 EF 65 to 70%, there is  mildly elevated pulmonary systolic pressure.  ENT seen in past for polyps.   Past smoker, quit around age 12.  Smoked 2 PPD. Last CT Chest in 2023 did note moderate size hiatal hernia.  Follows with neurology for headaches, occipital neuralgia. Last visit was 10/05/23 when received Botox, but did not tolerate this and went to ER.  She received Fioricet  while there.  Returns to see them 01/29/24. Takes Lyrica . Duration: months Circumstances of initial development of cough: unknown Cough severity: mild Cough description: non-productive and dry Aggravating factors:  nothing Alleviating factors: none Status:  fluctuating Treatments attempted: Robitussin and Mucinex  Wheezing: no Shortness of breath: sometimes with activity Chest pain: no Chest tightness:no Nasal congestion: no Runny nose: occasional Postnasal drip: no Frequent throat clearing or swallowing: yes Hemoptysis: no Fevers: no Night sweats: no Weight loss: no Heartburn: no Recent foreign travel: no Tuberculosis contacts: no   INSOMNIA Takes Xanax , has been on for many years. Dr. Corlis, her previous PCP, started many years ago. Wishes to remain on it -- takes 1/2 tablet Xanax  in morning and 1 tablet at night. Pt is aware of risks of psychoactive medication use to include increased sedation, respiratory suppression, falls, extrapyramidal movements, dependence and cardiovascular events.  Pt would like to continue treatment as benefit determined to outweigh risk.  Last filled on 12/22/23. Duration: chronic Satisfied with sleep quality: yes Difficulty falling asleep: yes Difficulty staying asleep: yes Waking a few hours after  sleep onset: no Early morning awakenings: yes Daytime hypersomnolence: no Wakes feeling refreshed: yes Good sleep hygiene: yes Apnea: no Snoring: no Depressed/anxious mood: yes Recent stress: no Restless legs/nocturnal leg cramps: no Chronic pain/arthritis: yes  Relevant past medical, surgical, family  and social history reviewed and updated as indicated. Interim medical history since our last visit reviewed. Allergies and medications reviewed and updated.  Review of Systems  Constitutional:  Negative for activity change, appetite change, diaphoresis, fatigue and fever.  Respiratory:  Positive for cough and shortness of breath. Negative for chest tightness and wheezing.   Cardiovascular:  Positive for leg swelling (occasional). Negative for chest pain and palpitations.  Gastrointestinal: Negative.   Neurological:  Positive for headaches. Negative for dizziness, syncope, weakness, light-headedness and numbness.  Psychiatric/Behavioral: Negative.      Per HPI unless specifically indicated above     Objective:    BP 139/77 (BP Location: Left Arm, Patient Position: Sitting, Cuff Size: Normal)   Pulse 61   Temp 98.4 F (36.9 C) (Oral)   Resp 16   Ht 5' (1.524 m)   Wt 143 lb 12.8 oz (65.2 kg)   SpO2 98%   BMI 28.08 kg/m   Wt Readings from Last 3 Encounters:  01/19/24 143 lb 12.8 oz (65.2 kg)  11/15/23 142 lb (64.4 kg)  10/24/23 146 lb 3.2 oz (66.3 kg)    Physical Exam Vitals and nursing note reviewed.  Constitutional:      General: She is awake. She is not in acute distress.    Appearance: She is well-developed and well-groomed. She is not ill-appearing or toxic-appearing.  HENT:     Head: Normocephalic.     Right Ear: Hearing, tympanic membrane, ear canal and external ear normal.     Left Ear: Hearing, tympanic membrane, ear canal and external ear normal.     Nose: Nose normal.     Right Sinus: No maxillary sinus tenderness or frontal sinus tenderness.     Left Sinus: No maxillary sinus tenderness or frontal sinus tenderness.     Mouth/Throat:     Mouth: Mucous membranes are moist.     Pharynx: Posterior oropharyngeal erythema (mild) present. No pharyngeal swelling or oropharyngeal exudate.  Eyes:     General: Lids are normal.        Right eye: No discharge.        Left  eye: No discharge.     Conjunctiva/sclera: Conjunctivae normal.     Pupils: Pupils are equal, round, and reactive to light.  Neck:     Thyroid : No thyromegaly.     Vascular: No carotid bruit.  Cardiovascular:     Rate and Rhythm: Normal rate and regular rhythm.     Heart sounds: Normal heart sounds. No murmur heard.    No gallop.  Pulmonary:     Effort: Pulmonary effort is normal. No accessory muscle usage or respiratory distress.     Breath sounds: Normal breath sounds. No decreased breath sounds, wheezing or rales.     Comments: O2 sats with walking in office 96 to 97%.  No SOB with talking. Abdominal:     General: Bowel sounds are normal. There is no distension.     Palpations: Abdomen is soft.     Tenderness: There is no abdominal tenderness.  Musculoskeletal:     Cervical back: Normal range of motion and neck supple.     Right lower leg: No edema.     Left lower leg: No edema.  Lymphadenopathy:     Cervical: No cervical adenopathy.  Skin:    General: Skin is warm and dry.     Findings: Bruising present.     Comments: To bilateral upper extremities  Neurological:     Mental Status: She is alert and oriented to person, place, and time.     Deep Tendon Reflexes: Reflexes are normal and symmetric.     Reflex Scores:      Brachioradialis reflexes are 2+ on the right side and 2+ on the left side.      Patellar reflexes are 2+ on the right side and 2+ on the left side. Psychiatric:        Attention and Perception: Attention normal.        Mood and Affect: Mood normal.        Speech: Speech normal.        Behavior: Behavior normal. Behavior is cooperative.        Thought Content: Thought content normal.    Results for orders placed or performed in visit on 10/24/23  TSH   Collection Time: 10/24/23  2:43 PM  Result Value Ref Range   TSH 1.130 0.450 - 4.500 uIU/mL  T4, free   Collection Time: 10/24/23  2:43 PM  Result Value Ref Range   Free T4 1.28 0.82 - 1.77 ng/dL   Lipid Panel w/o Chol/HDL Ratio   Collection Time: 10/24/23  2:43 PM  Result Value Ref Range   Cholesterol, Total 186 100 - 199 mg/dL   Triglycerides 856 0 - 149 mg/dL   HDL 71 >60 mg/dL   VLDL Cholesterol Cal 24 5 - 40 mg/dL   LDL Chol Calc (NIH) 91 0 - 99 mg/dL      Assessment & Plan:   Problem List Items Addressed This Visit       Cardiovascular and Mediastinum   Senile purpura (HCC)   Noted to upper and lower extremities.  Recommended to patient gentle cleansing daily with soap and water + application of lotion.  Monitor closely for skin breakdown or wounds, immediately notify provider if present.      Moderate pulmonary hypertension (HCC) - Primary   Chronic, ongoing.  Followed by cardiology, continue current medication regimen as prescribed by them and collaboration.  Recent notes reviewed. Having SOB and cough more recently, will place referral to pulmonary for further assessment of function.  ?if related to [facility] or reflux (hiatal hernia).      Relevant Orders   Ambulatory referral to Pulmonology   Essential hypertension   Chronic, stable.  BP at goal for her age in office.  Recommend she monitor BP at least a few mornings a week at home and document.  DASH diet at home.  Continue Torsemide  only.  Collaboration with cardiology - recent note reviewed.  Labs today: up to date.  Urine ALB < 20 May 2023.         Nervous and Auditory   Occipital neuralgia   Chronic, ongoing.  Continues to be followed by neurology.  Will continue this collaboration and medication regimen as ordered by them.  Recent note reviewed. Educated her on Tylenol  and her max dose daily, which includes dose in Fioricet  if taken.      Relevant Medications   ALPRAZolam  (XANAX ) 1 MG tablet     Genitourinary   CKD (chronic kidney disease) stage 4, GFR 15-29 ml/min (HCC)   Chronic, ongoing.  Followed by nephrology.  Currently CKD 4.  Educated her on  diet and fluid intake + current medications. Urine ALB  <20 May 2023.  Recommend continue all current medications and wear compression hose at home on during day and off at night.  Labs up to date with nephrology.        Other   Subacute cough   She reports on and off for 2-3 months, overall lung assessment reassuring today and O2 sats while walking staying between 96 to 97%.  ?if related to hiatal hernia or pulmonary HTN.  Will place referral to pulmonary for further assessment of function.  Start Protonix  20 MG daily, educated her on this medication and side effects to report.  No red flags on exam today.  Will consider imaging future visit.  Is a past smoker many years ago.      Mixed hyperlipidemia   Chronic, ongoing.  Continue current medication regimen and adjust as needed.  Labs up to date.      Insomnia   Chronic, ongoing for years.  Previous PCP, Dr. Corlis, placed her on Xanax  many years ago and she still takes, reports she cannot sleep without it.  Refuses to try anything else. At this time will continue current regimen and adjust if possible.  Aware of need for every 3 month visits. Controlled substance agreement at next visit and UDS up to date (due next 08/22/24).  Return in 3 months.      Relevant Medications   ALPRAZolam  (XANAX ) 1 MG tablet   Anemia due to stage 4 chronic kidney disease (HCC)   Chronic, improving. Followed by hematology and nephrology, continue this collaboration.  Recent notes reviewed.        Follow up plan: Return in about 4 weeks (around 02/16/2024) for COUGH -- cancel visit on 10th and change to above request.

## 2024-01-19 NOTE — Assessment & Plan Note (Signed)
 Chronic, ongoing.  Followed by cardiology, continue current medication regimen as prescribed by them and collaboration.  Recent notes reviewed. Having SOB and cough more recently, will place referral to pulmonary for further assessment of function.  ?if related to [facility] or reflux (hiatal hernia).

## 2024-01-19 NOTE — Patient Instructions (Signed)

## 2024-01-19 NOTE — Assessment & Plan Note (Signed)
 She reports on and off for 2-3 months, overall lung assessment reassuring today and O2 sats while walking staying between 96 to 97%.  ?if related to hiatal hernia or pulmonary HTN.  Will place referral to pulmonary for further assessment of function.  Start Protonix  20 MG daily, educated her on this medication and side effects to report.  No red flags on exam today.  Will consider imaging future visit.  Is a past smoker many years ago.

## 2024-01-19 NOTE — Assessment & Plan Note (Signed)
Noted to upper and lower extremities.  Recommended to patient gentle cleansing daily with soap and water + application of lotion.  Monitor closely for skin breakdown or wounds, immediately notify provider if present. 

## 2024-01-19 NOTE — Assessment & Plan Note (Addendum)
 Chronic, ongoing.  Continues to be followed by neurology.  Will continue this collaboration and medication regimen as ordered by them.  Recent note reviewed. Educated her on Tylenol  and her max dose daily, which includes dose in Fioricet  if taken.

## 2024-01-19 NOTE — Assessment & Plan Note (Signed)
 Chronic, ongoing.  Continue current medication regimen and adjust as needed.  Labs up to date.

## 2024-01-19 NOTE — Assessment & Plan Note (Signed)
 Chronic, ongoing.  Followed by nephrology.  Currently CKD 4.  Educated her on diet and fluid intake + current medications. Urine ALB <20 May 2023.  Recommend continue all current medications and wear compression hose at home on during day and off at night.  Labs up to date with nephrology.

## 2024-01-19 NOTE — Assessment & Plan Note (Signed)
 Chronic, stable.  BP at goal for her age in office.  Recommend she monitor BP at least a few mornings a week at home and document.  DASH diet at home.  Continue Torsemide  only.  Collaboration with cardiology - recent note reviewed.  Labs today: up to date.  Urine ALB < 20 May 2023.

## 2024-01-19 NOTE — Assessment & Plan Note (Signed)
 Chronic, improving. Followed by hematology and nephrology, continue this collaboration.  Recent notes reviewed.

## 2024-01-23 ENCOUNTER — Ambulatory Visit

## 2024-01-23 DIAGNOSIS — M6281 Muscle weakness (generalized): Secondary | ICD-10-CM | POA: Diagnosis not present

## 2024-01-23 DIAGNOSIS — R262 Difficulty in walking, not elsewhere classified: Secondary | ICD-10-CM | POA: Diagnosis not present

## 2024-01-23 DIAGNOSIS — M25561 Pain in right knee: Secondary | ICD-10-CM

## 2024-01-23 DIAGNOSIS — R278 Other lack of coordination: Secondary | ICD-10-CM | POA: Diagnosis not present

## 2024-01-23 DIAGNOSIS — R269 Unspecified abnormalities of gait and mobility: Secondary | ICD-10-CM | POA: Diagnosis not present

## 2024-01-23 DIAGNOSIS — R2681 Unsteadiness on feet: Secondary | ICD-10-CM

## 2024-01-23 NOTE — Therapy (Signed)
 OUTPATIENT PHYSICAL THERAPY TREATMENT  Patient Name: Tracie Fisher MRN: [id number] DOB:1936/04/10, 88 y.o., female Today's Date: 01/23/2024  END OF SESSION:  PT End of Session - 01/23/24 1107     Visit Number 21    Number of Visits 32    Date for PT Re-Evaluation 02/29/24    Authorization Type [employer]    Authorization Time Period 01/04/24-02/29/24    Progress Note Due on Visit 20    PT Start Time 1103    PT Stop Time 1143    PT Time Calculation (min) 40 min    Equipment Utilized During Treatment Gait belt    Activity Tolerance Patient tolerated treatment well;No increased pain    Behavior During Therapy WFL for tasks assessed/performed          Past Medical History:  Diagnosis Date   Cancer (HCC)    SKIN   Complication of anesthesia    Depression    Dysrhythmia    GERD (gastroesophageal reflux disease)    Hiatal hernia    HOH (hard of hearing)    Hypertension    Hypothyroidism    PONV (postoperative nausea and vomiting)    Thyroid  disease    Past Surgical History:  Procedure Laterality Date   APPENDECTOMY     BACK SURGERY     MULTIPLE/ROD IN BACK   BREAST CYST ASPIRATION Left years ago   CATARACT EXTRACTION W/PHACO Left 03/21/2017   Procedure: CATARACT EXTRACTION PHACO AND INTRAOCULAR LENS PLACEMENT (IOC);  Surgeon: Jaye Fallow, MD;  Location: [facility];  Service: Ophthalmology;  Laterality: Left;  US  00:48 AP% 17.8 CDE 8.57 Fluid pack lot # [id number]   CATARACT EXTRACTION W/PHACO Right 04/11/2017   Procedure: CATARACT EXTRACTION PHACO AND INTRAOCULAR LENS PLACEMENT (IOC);  Surgeon: Jaye Fallow, MD;  Location: [facility];  Service: Ophthalmology;  Laterality: Right;  US  01:09.5 AP% 15.8 CDE 11.04 Fluid Pack lot # [id number]   CHOLECYSTECTOMY     COLON SURGERY     BOWEL OBSTRUCTION   COLONOSCOPY WITH PROPOFOL  N/A 04/25/2016   Procedure: COLONOSCOPY WITH PROPOFOL ;  Surgeon: Ruel Kung, MD;  Location: [facility];  Service: Endoscopy;   Laterality: N/A;   ESOPHAGOGASTRODUODENOSCOPY (EGD) WITH PROPOFOL  N/A 04/25/2016   Procedure: ESOPHAGOGASTRODUODENOSCOPY (EGD) WITH PROPOFOL ;  Surgeon: Ruel Kung, MD;  Location: [facility];  Service: Endoscopy;  Laterality: N/A;   EXPLORATORY LAPAROTOMY  10/18/2011   blockage small intestines   GIVENS CAPSULE STUDY N/A 05/11/2016   Procedure: GIVENS CAPSULE STUDY;  Surgeon: Ruel Kung, MD;  Location: [facility];  Service: Endoscopy;  Laterality: N/A;   Patient Active Problem List   Diagnosis Date Noted   Subacute cough 01/19/2024   Carotid stenosis 10/22/2023   Long-term current use of benzodiazepine 08/23/2023   Lymphedema 03/29/2023   Anemia due to stage 4 chronic kidney disease (HCC) 02/04/2023   Overweight (BMI 25.0-29.9) 08/05/2022   Vision loss 08/04/2022   Senile purpura (HCC) 06/16/2022   Insomnia 06/16/2022   Varicose veins of both lower extremities without ulcer or inflammation 06/16/2022   Mixed hyperlipidemia 06/12/2022   Occipital neuralgia 06/12/2022   Sensory ataxia 06/12/2022   Osteopenia of left forearm 06/12/2022   CMC arthritis 05/23/2022   CKD (chronic kidney disease) stage 4, GFR 15-29 ml/min (HCC) 03/03/2022   Hypothyroidism, adult 03/01/2022   Loss of memory 07/26/2021   Moderate pulmonary hypertension (HCC) 05/20/2021   Primary osteoarthritis involving multiple joints 03/24/2021   Unsteady gait 10/16/2019   History of stroke 03/04/2019  Bilateral foot pain 03/06/2018   Chronic bilateral low back pain 03/06/2018   Neck pain 03/06/2018   Neuropathy 02/06/2018   History of nonmelanoma skin cancer 09/27/2016   Essential hypertension 09/02/2016   Hiatal hernia    Stricture of esophagus    Iron  deficiency anemia, unspecified 03/23/2016   History of tachycardia 07/29/2015   PCP: [name], NP  REFERRING PROVIDER: Lynwood Hue, MD  REFERRING DIAG: Rt knee pain  THERAPY DIAG:  Abnormality of gait and mobility  Acute pain of right  knee  Difficulty in walking, not elsewhere classified  Muscle weakness (generalized)  Unsteadiness on feet  Rationale for Evaluation and Treatment: Rehabilitation  ONSET DATE: June 2025  SUBJECTIVE:  SUBJECTIVE STATEMENT: Pt doing well today. HA this morning, took a pill, but better now. Now waiting for consult with pulmonology.    PERTINENT HISTORY: 88yoF referred to OPPT for acute insidious Rt medial knee pain. Pt seen by Dr. Hue [facility], per pt he recommended PT to gain muscle and strength in this knee, but generally the joint looked quite favorable. Unrelated, pt reports significant debility and difficulty walking without limiting DOE since being hospitalized last year- this has created more of activity restriction than her knee pain alone.   PAIN:  Are you having pain? No pain today;   PRECAUTIONS: None  WEIGHT BEARING RESTRICTIONS: No  FALLS:  Has patient fallen in last 6 months? Yes. Number of falls 1  LIVING ENVIRONMENT: Lives with: lives with their family and lives with their son Stairs: No Has following equipment at home: Single point cane and Environmental consultant - 2 wheeled  OCCUPATION: retired   PLOF: drives, AMB with SPC more recently or walker last year when she left [employer], history of falls/neuropathy.   PATIENT GOALS: resolution of knee pain   OBJECTIVE:  Note: Objective measures were completed at evaluation unless otherwise noted.  DIAGNOSTIC FINDINGS:   Recently seen by orthopedics. PATIENT SURVEYS:  Attempted LEFS and KOOD, neither of which were compatible with pt's self-report capacity.  COGNITION: Overall cognitive status: globally WNL, mentation somewhat limiting to ability to response to LEFS/KOOS in a meaningful way.   EDEMA:  None seen upon visual inspection  PALPATION: Rt knee extensor mechanism; not tender, no focal or global edema/bogginess, healthy muscle mass JOINT RANGE:   ROM Right eval Left eval  Knee flexion >110*   Knee extension  full full   *pt reports not limited, flexoin at baseline  FUNCTIONAL TESTS:  5xSTS: 20sec, hands free, no LOB, mild antalgia reps 4-5  478ft AMB  overground: SPC use, 8 minutes, 38 sec (11/16/23)   GAIT: Distance walked: 22 meters Assistive device utilized: SPC  Level of assistance: ModI  Comments: slow, appears to be limited more by baseline balance deficits  TODAY'S TREATMENT : 01/23/24      -STS from chair hands free x10  -STS from chair hands free 1x10 c 5kg ball -side stepping in // bars with 4lb AW, hands free 4x bilat (DOE with elevated RR)  -side stepping in // bars, no hands, no weights, red mat on half of it for balance challenge 4x bilat  -side stepping in // bars, no hands, no weights, red mat on half of it for balance challenge 4x bilat  -balance beam walking in // bars on 2x4 1x84ft with intermittent hand support  -balance beam walking in // bars on 2x4 1x30ft with intermittent hand support     PATIENT EDUCATION:  Education details: Pt educated throughout session about proper posture and technique with exercises. Improved exercise technique, movement at target joints, use of target muscles after min to mod verbal, visual, tactile cues. Person educated: Pt  Education method: collaborative learning, deliberate practice, positive reinforcement, explicit instruction, establish rules. Education comprehension: seems ok; FU in future to determine LT comprehension/adherence  HOME EXERCISE PROGRAM: Access Code: [email or web address] URL: [email or web address] Date: 11/14/2023 Prepared by: Peggye Linear  Exercises - Supine Knee Extension Strengthening  - 2 x daily - 1 sets - 15 reps - Supine Bridge  - 2 x daily - 1 sets - 10 reps (low range as not to aggravate chronic low back pain)  - Seated Long Arc Quad  - 2 x daily - 1 sets - 10 reps - Heel Raises  with Counter Support  - 2 x daily - 1 sets - 15 reps  ASSESSMENT:  CLINICAL IMPRESSION: Reassessment last session, pt continues to demonstrate excellent progress overall toward goals of care. Primary reason for referral has referral (knee pain) is significantly better with signs of improvement in most measures. AMB remains more limited in progress largely limited by insidious DOE/SOB, which has been a problem since starting, pt still undergoing workup for this with PCP and specialists. Despite best efforts, wlaking interventions here have made only marginal improvements to overground AMB tolerance. Pt will start working toward DC plan over next few visits. Pt will continue to benefit from skilled therapy to address remaining deficits in order to improve overall QoL and return to PLOF.    OBJECTIVE IMPAIRMENTS: Decreased knowledge of condition, decreased use of DME, decreased mobility, difficulty walking, decreased strength.  ACTIVITY LIMITATIONS: Lifting, standing, walking, squatting, transfers, locomotion level PARTICIPATION LIMITATIONS: Cleaning, laundry, interpersonal relationships, driving, yardwork, community activity.  PERSONAL FACTORS: Age, behavior pattern, education, past/current experiences, transportation, profession  are also affecting patient's functional outcome.  REHAB POTENTIAL: Good.  CLINICAL DECISION MAKING: moderate  EVALUATION COMPLEXITY: Stable/uncomplicated   GOALS: Goals reviewed with patient? No  SHORT TERM GOALS: Target date: 12/15/23 Patient will report comprehension, confidence, and consistent compliance and of a simple home exercise program established to facilitate symptoms management and basic strengthening and/or segment mobility.   Baseline: issued at visit 1; 12/14/2023=Patient reports walking outside and doing most of the exercisses Goal status: MET  2.  Pt to demonstrate improvement in transfers power AEB 5xSTS<17seconds.  Baseline: 5XSTS 20sec on visit 1;  12/14/2023: 13.98 sec without UE support Goal status: MET  LONG TERM GOALS: Target date: 01/15/24 Pt to demonstrate ability to perform without pain limitation, less than 2/10 increase in NPRS, and with >10% improvement in distance to improve ability to participate in IADL and social events.    Baseline: pending assessment visit 2 ; 11/20/2023=440 feet with SPC and 550 feet with 4WW;  12/14/2023= 630 feet without UE support with report of only minimal low back pain reported during last 30 sec of walk.  01/04/24: 794' without UE support, 2/10 pain and 1 standing rest break 01/18/24: 776' without UE support, no pain, and no rest breaks Goal status: Progressing  2.  Patient to demonstrate improved performance on initial transfers assessment AEB improved reps per time or time per perform desired reps and/or reduced seat height and/or use of hands in order to improve safety, tolerance, and independence in ADL performance.    Baseline: visit 1 5xSTS: 20sec; 12/14/2023= 13.98 sec without UE support  01/04/24: 16.33 sec without UE support 01/18/24: 13.97 sec without UE Goal status: PROGRESSING  3.  Pt to report ability to perform housework and light household activities ad lib without painful restrictions to these activities.  Baseline: limited by pain at eval; 12/14/2023= Patient reports doing household chores with some intermittent rest breaks- including washing clothes, dishes, sweeping, vacuuming.  01/04/24: Pt is able to do everything without any complications other than the L hand.  Back is much better. Goal status: MET  4. Pt will improve BERG by at least 3 points in order to demonstrate clinically significant improvement in balance. Baseline: 12/14/2023= 50/56; 01/04/24: 52/56 Goal status: PROGRESSING  5.  Patient will improve with gait speed as seen by improved 10 MWT > 1.15m/s with no device vs. Cane for improved mobility and decreased risk of falling.  Baseline: 01/04/24: 0.24m/s; 01/18/24: 0.97  m/s Goal Status: PROGRESSING  PLAN:  PT FREQUENCY: 1-2x/week  PT DURATION: 8 weeks  PLANNED INTERVENTIONS: 97110-Therapeutic exercises, 97530- Therapeutic activity, 97112- Neuromuscular re-education, 97535- Self Care, [id number]- Manual therapy, [id number]- Electrical stimulation (unattended), [phone]- Electrical stimulation (manual), Patient/Family education, Balance training, Stair training, Joint mobilization, Joint manipulation, Visual/preceptual remediation/compensation, DME instructions, Cryotherapy, and Moist heat  PLAN FOR NEXT SESSION:  Progressive balance interventions, progress gait speed activities, begin preparation for DC in next few visits.    11:27 AM, 01/23/24 Peggye JAYSON Linear, PT, DPT Physical Therapist - [facility]  [phone] [facility])

## 2024-01-24 ENCOUNTER — Ambulatory Visit: Admitting: Nurse Practitioner

## 2024-01-25 ENCOUNTER — Ambulatory Visit

## 2024-01-25 DIAGNOSIS — R269 Unspecified abnormalities of gait and mobility: Secondary | ICD-10-CM | POA: Diagnosis not present

## 2024-01-25 DIAGNOSIS — R2681 Unsteadiness on feet: Secondary | ICD-10-CM | POA: Diagnosis not present

## 2024-01-25 DIAGNOSIS — R262 Difficulty in walking, not elsewhere classified: Secondary | ICD-10-CM | POA: Diagnosis not present

## 2024-01-25 DIAGNOSIS — R278 Other lack of coordination: Secondary | ICD-10-CM

## 2024-01-25 DIAGNOSIS — M6281 Muscle weakness (generalized): Secondary | ICD-10-CM | POA: Diagnosis not present

## 2024-01-25 DIAGNOSIS — M25561 Pain in right knee: Secondary | ICD-10-CM | POA: Diagnosis not present

## 2024-01-25 NOTE — Therapy (Signed)
 OUTPATIENT PHYSICAL THERAPY TREATMENT  Patient Name: Tracie Fisher MRN: [id number] DOB:Dec 28, 1935, 88 y.o., female Today's Date: 01/25/2024  END OF SESSION:  PT End of Session - 01/25/24 1159     Visit Number 22    Number of Visits 32    Date for PT Re-Evaluation 02/29/24    Authorization Type [employer]    Authorization Time Period 01/04/24-02/29/24    Progress Note Due on Visit 20    PT Start Time 1149    PT Stop Time 1230    PT Time Calculation (min) 41 min    Equipment Utilized During Treatment Gait belt    Activity Tolerance Patient tolerated treatment well;No increased pain    Behavior During Therapy WFL for tasks assessed/performed          Past Medical History:  Diagnosis Date   Cancer (HCC)    SKIN   Complication of anesthesia    Depression    Dysrhythmia    GERD (gastroesophageal reflux disease)    Hiatal hernia    HOH (hard of hearing)    Hypertension    Hypothyroidism    PONV (postoperative nausea and vomiting)    Thyroid  disease    Past Surgical History:  Procedure Laterality Date   APPENDECTOMY     BACK SURGERY     MULTIPLE/ROD IN BACK   BREAST CYST ASPIRATION Left years ago   CATARACT EXTRACTION W/PHACO Left 03/21/2017   Procedure: CATARACT EXTRACTION PHACO AND INTRAOCULAR LENS PLACEMENT (IOC);  Surgeon: Jaye Fallow, MD;  Location: [facility];  Service: Ophthalmology;  Laterality: Left;  US  00:48 AP% 17.8 CDE 8.57 Fluid pack lot # [id number]   CATARACT EXTRACTION W/PHACO Right 04/11/2017   Procedure: CATARACT EXTRACTION PHACO AND INTRAOCULAR LENS PLACEMENT (IOC);  Surgeon: Jaye Fallow, MD;  Location: [facility];  Service: Ophthalmology;  Laterality: Right;  US  01:09.5 AP% 15.8 CDE 11.04 Fluid Pack lot # [id number]   CHOLECYSTECTOMY     COLON SURGERY     BOWEL OBSTRUCTION   COLONOSCOPY WITH PROPOFOL  N/A 04/25/2016   Procedure: COLONOSCOPY WITH PROPOFOL ;  Surgeon: Ruel Kung, MD;  Location: [facility];  Service: Endoscopy;   Laterality: N/A;   ESOPHAGOGASTRODUODENOSCOPY (EGD) WITH PROPOFOL  N/A 04/25/2016   Procedure: ESOPHAGOGASTRODUODENOSCOPY (EGD) WITH PROPOFOL ;  Surgeon: Ruel Kung, MD;  Location: [facility];  Service: Endoscopy;  Laterality: N/A;   EXPLORATORY LAPAROTOMY  10/18/2011   blockage small intestines   GIVENS CAPSULE STUDY N/A 05/11/2016   Procedure: GIVENS CAPSULE STUDY;  Surgeon: Ruel Kung, MD;  Location: [facility];  Service: Endoscopy;  Laterality: N/A;   Patient Active Problem List   Diagnosis Date Noted   Subacute cough 01/19/2024   Carotid stenosis 10/22/2023   Long-term current use of benzodiazepine 08/23/2023   Lymphedema 03/29/2023   Anemia due to stage 4 chronic kidney disease (HCC) 02/04/2023   Overweight (BMI 25.0-29.9) 08/05/2022   Vision loss 08/04/2022   Senile purpura (HCC) 06/16/2022   Insomnia 06/16/2022   Varicose veins of both lower extremities without ulcer or inflammation 06/16/2022   Mixed hyperlipidemia 06/12/2022   Occipital neuralgia 06/12/2022   Sensory ataxia 06/12/2022   Osteopenia of left forearm 06/12/2022   CMC arthritis 05/23/2022   CKD (chronic kidney disease) stage 4, GFR 15-29 ml/min (HCC) 03/03/2022   Hypothyroidism, adult 03/01/2022   Loss of memory 07/26/2021   Moderate pulmonary hypertension (HCC) 05/20/2021   Primary osteoarthritis involving multiple joints 03/24/2021   Unsteady gait 10/16/2019   History of stroke 03/04/2019  Bilateral foot pain 03/06/2018   Chronic bilateral low back pain 03/06/2018   Neck pain 03/06/2018   Neuropathy 02/06/2018   History of nonmelanoma skin cancer 09/27/2016   Essential hypertension 09/02/2016   Hiatal hernia    Stricture of esophagus    Iron  deficiency anemia, unspecified 03/23/2016   History of tachycardia 07/29/2015   PCP: [name], NP  REFERRING PROVIDER: Lynwood Hue, MD  REFERRING DIAG: Rt knee pain  THERAPY DIAG:  Abnormality of gait and mobility  Acute pain of right  knee  Difficulty in walking, not elsewhere classified  Muscle weakness (generalized)  Unsteadiness on feet  Other lack of coordination  Rationale for Evaluation and Treatment: Rehabilitation  ONSET DATE: June 2025  SUBJECTIVE:  SUBJECTIVE STATEMENT:  Pt reports she has an appointment scheduled with pulmonologist in October.  Pt notes she had a really bad headache last night, and it still was hurting this morning but she elected to not take any meds this morning for it.  Pt reports she took one pill at 10 PM and then another at 11 PM last night.    PERTINENT HISTORY: 88yoF referred to OPPT for acute insidious Rt medial knee pain. Pt seen by Dr. Hue [facility] orthopedics, per pt he recommended PT to gain muscle and strength in this knee, but generally the joint looked quite favorable. Unrelated, pt reports significant debility and difficulty walking without limiting DOE since being hospitalized last year- this has created more of activity restriction than her knee pain alone.   PAIN:  Are you having pain? No pain today;   PRECAUTIONS: None  WEIGHT BEARING RESTRICTIONS: No  FALLS:  Has patient fallen in last 6 months? Yes. Number of falls 1  LIVING ENVIRONMENT: Lives with: lives with their family and lives with their son Stairs: No Has following equipment at home: Single point cane and Environmental consultant - 2 wheeled  OCCUPATION: retired   PLOF: drives, AMB with SPC more recently or walker last year when she left [facility], history of falls/neuropathy.   PATIENT GOALS: resolution of knee pain   OBJECTIVE:  Note: Objective measures were completed at evaluation unless otherwise noted.  DIAGNOSTIC FINDINGS:   Recently seen by orthopedics. PATIENT SURVEYS:  Attempted LEFS and KOOD, neither of which were compatible with pt's self-report capacity.  COGNITION: Overall cognitive status: globally WNL, mentation somewhat limiting to ability to response to LEFS/KOOS in a meaningful way.   EDEMA:   None seen upon visual inspection  PALPATION: Rt knee extensor mechanism; not tender, no focal or global edema/bogginess, healthy muscle mass JOINT RANGE:   ROM Right eval Left eval  Knee flexion >110*   Knee extension full full   *pt reports not limited, flexoin at baseline  FUNCTIONAL TESTS:  5xSTS: 20sec, hands free, no LOB, mild antalgia reps 4-5  427ft AMB  overground: SPC use, 8 minutes, 38 sec (11/16/23)   GAIT: Distance walked: 22 meters Assistive device utilized: SPC  Level of assistance: ModI  Comments: slow, appears to be limited more by baseline balance deficits  TODAY'S TREATMENT : 01/25/24       Self-Care/Home Management:  Education provided on potential use of shower chair due to pt noting it being difficult to get in her tub and has refused to perform at this time since she feels unsafe in getting in.  Pt was given visual demonstration of the use of shower chair in the OT bathroom as an option, however pt reports she is having paperwork filled out to get a walk-in shower.      TherAct: To improve functional movements patterns for everyday tasks  STS from chair hands free x10  STS from chair hands free 1x10 with 5kg ball  Ambulation around the gym, to the cafeteria and down EVS hallway, 3# AW donned each LE    Neuromuscular Re-Ed: To facilitate reeducation of movement, balance, posture, coordination, and/or proprioception/kinesthetic sense.  Static stance on Airex pad, 30 sec bouts Static NBOS on Airex pad, 30 sec bouts Static staggered stance on Airex pad, 30 sec bouts      PATIENT EDUCATION:  Education details: Pt educated throughout session about proper posture and technique with exercises. Improved exercise technique, movement at target joints, use of target muscles after min to mod verbal, visual, tactile cues. Person  educated: Pt  Education method: collaborative learning, deliberate practice, positive reinforcement, explicit instruction, establish rules. Education comprehension: seems ok; FU in future to determine LT comprehension/adherence  HOME EXERCISE PROGRAM: Access Code: [email or web address] URL: [email or web address] Date: 11/14/2023 Prepared by: Peggye Linear  Exercises - Supine Knee Extension Strengthening  - 2 x daily - 1 sets - 15 reps - Supine Bridge  - 2 x daily - 1 sets - 10 reps (low range as not to aggravate chronic low back pain)  - Seated Long Arc Quad  - 2 x daily - 1 sets - 10 reps - Heel Raises with Counter Support  - 2 x daily - 1 sets - 15 reps  ASSESSMENT:  CLINICAL IMPRESSION:  Pt continues to perform well with exercises and is making good progress towards goals.  Pt responded well to the ambulation attempt and was able to progress as noted above.  Pt and therapist also discussed pt discharging at the next visit.   Pt will continue to benefit from skilled therapy to address remaining deficits in order to improve overall QoL and return to PLOF.     OBJECTIVE IMPAIRMENTS: Decreased knowledge of condition, decreased use of DME, decreased mobility, difficulty walking, decreased strength.  ACTIVITY LIMITATIONS: Lifting, standing, walking, squatting, transfers, locomotion level PARTICIPATION LIMITATIONS: Cleaning, laundry, interpersonal relationships, driving, yardwork, community activity.  PERSONAL FACTORS: Age, behavior pattern, education, past/current experiences, transportation, profession  are also affecting patient's functional outcome.  REHAB POTENTIAL: Good.  CLINICAL DECISION MAKING: moderate  EVALUATION COMPLEXITY: Stable/uncomplicated   GOALS: Goals reviewed with patient? No  SHORT TERM GOALS: Target date: 12/15/23 Patient will report comprehension, confidence, and consistent compliance and of a simple home exercise program established to facilitate symptoms  management and basic strengthening and/or segment mobility.   Baseline: issued at visit 1; 12/14/2023=Patient reports walking outside and doing most of the exercisses Goal status: MET  2.  Pt to demonstrate improvement in transfers power AEB 5xSTS<17seconds.  Baseline: 5XSTS 20sec on visit 1; 12/14/2023: 13.98 sec without UE support Goal status: MET  LONG TERM GOALS: Target date: 01/15/24 Pt to demonstrate ability to perform without pain limitation, less than 2/10 increase in NPRS, and with >10% improvement in distance to improve ability to participate in IADL and  social events.    Baseline: pending assessment visit 2 ; 11/20/2023=440 feet with SPC and 550 feet with 4WW;  12/14/2023= 630 feet without UE support with report of only minimal low back pain reported during last 30 sec of walk.  01/04/24: 794' without UE support, 2/10 pain and 1 standing rest break 01/18/24: 776' without UE support, no pain, and no rest breaks Goal status: Progressing  2.  Patient to demonstrate improved performance on initial transfers assessment AEB improved reps per time or time per perform desired reps and/or reduced seat height and/or use of hands in order to improve safety, tolerance, and independence in ADL performance.    Baseline: visit 1 5xSTS: 20sec; 12/14/2023= 13.98 sec without UE support  01/04/24: 16.33 sec without UE support 01/18/24: 13.97 sec without UE Goal status: PROGRESSING  3.  Pt to report ability to perform housework and light household activities ad lib without painful restrictions to these activities.  Baseline: limited by pain at eval; 12/14/2023= Patient reports doing household chores with some intermittent rest breaks- including washing clothes, dishes, sweeping, vacuuming.  01/04/24: Pt is able to do everything without any complications other than the L hand.  Back is much better. Goal status: MET  4. Pt will improve BERG by at least 3 points in order to demonstrate clinically  significant improvement in balance. Baseline: 12/14/2023= 50/56; 01/04/24: 52/56 Goal status: PROGRESSING  5.  Patient will improve with gait speed as seen by improved 10 MWT > 1.68m/s with no device vs. Cane for improved mobility and decreased risk of falling.  Baseline: 01/04/24: 0.75m/s; 01/18/24: 0.97 m/s Goal Status: PROGRESSING  PLAN:  PT FREQUENCY: 1-2x/week  PT DURATION: 8 weeks  PLANNED INTERVENTIONS: 97110-Therapeutic exercises, 97530- Therapeutic activity, 97112- Neuromuscular re-education, 97535- Self Care, 02859- Manual therapy, G0283- Electrical stimulation (unattended), [phone]- Electrical stimulation (manual), Patient/Family education, Balance training, Stair training, Joint mobilization, Joint manipulation, Visual/preceptual remediation/compensation, DME instructions, Cryotherapy, and Moist heat  PLAN FOR NEXT SESSION:   D/C at next session.    Fonda Simpers, PT, DPT Physical Therapist - [facility]  01/25/24, 5:41 PM

## 2024-01-29 DIAGNOSIS — R278 Other lack of coordination: Secondary | ICD-10-CM | POA: Diagnosis not present

## 2024-01-29 DIAGNOSIS — M5481 Occipital neuralgia: Secondary | ICD-10-CM | POA: Diagnosis not present

## 2024-01-29 DIAGNOSIS — G43719 Chronic migraine without aura, intractable, without status migrainosus: Secondary | ICD-10-CM | POA: Diagnosis not present

## 2024-01-29 DIAGNOSIS — Z8673 Personal history of transient ischemic attack (TIA), and cerebral infarction without residual deficits: Secondary | ICD-10-CM | POA: Diagnosis not present

## 2024-01-29 DIAGNOSIS — M542 Cervicalgia: Secondary | ICD-10-CM | POA: Diagnosis not present

## 2024-01-29 DIAGNOSIS — G629 Polyneuropathy, unspecified: Secondary | ICD-10-CM | POA: Diagnosis not present

## 2024-01-30 ENCOUNTER — Ambulatory Visit

## 2024-01-30 ENCOUNTER — Other Ambulatory Visit: Payer: Self-pay | Admitting: Neurology

## 2024-01-30 DIAGNOSIS — M542 Cervicalgia: Secondary | ICD-10-CM

## 2024-01-30 DIAGNOSIS — M5481 Occipital neuralgia: Secondary | ICD-10-CM

## 2024-01-30 NOTE — Therapy (Incomplete)
 OUTPATIENT PHYSICAL THERAPY TREATMENT  Patient Name: Tracie Fisher MRN: [id number] DOB:June 22, 1935, 88 y.o., female Today's Date: 01/30/2024  END OF SESSION:    Past Medical History:  Diagnosis Date   Cancer (HCC)    SKIN   Complication of anesthesia    Depression    Dysrhythmia    GERD (gastroesophageal reflux disease)    Hiatal hernia    HOH (hard of hearing)    Hypertension    Hypothyroidism    PONV (postoperative nausea and vomiting)    Thyroid  disease    Past Surgical History:  Procedure Laterality Date   APPENDECTOMY     BACK SURGERY     MULTIPLE/ROD IN BACK   BREAST CYST ASPIRATION Left years ago   CATARACT EXTRACTION W/PHACO Left 03/21/2017   Procedure: CATARACT EXTRACTION PHACO AND INTRAOCULAR LENS PLACEMENT (IOC);  Surgeon: Jaye Fallow, MD;  Location: [facility];  Service: Ophthalmology;  Laterality: Left;  US  00:48 AP% 17.8 CDE 8.57 Fluid pack lot # [id number]   CATARACT EXTRACTION W/PHACO Right 04/11/2017   Procedure: CATARACT EXTRACTION PHACO AND INTRAOCULAR LENS PLACEMENT (IOC);  Surgeon: Jaye Fallow, MD;  Location: [facility];  Service: Ophthalmology;  Laterality: Right;  US  01:09.5 AP% 15.8 CDE 11.04 Fluid Pack lot # [id number]   CHOLECYSTECTOMY     COLON SURGERY     BOWEL OBSTRUCTION   COLONOSCOPY WITH PROPOFOL  N/A 04/25/2016   Procedure: COLONOSCOPY WITH PROPOFOL ;  Surgeon: Ruel Kung, MD;  Location: [facility];  Service: Endoscopy;  Laterality: N/A;   ESOPHAGOGASTRODUODENOSCOPY (EGD) WITH PROPOFOL  N/A 04/25/2016   Procedure: ESOPHAGOGASTRODUODENOSCOPY (EGD) WITH PROPOFOL ;  Surgeon: Ruel Kung, MD;  Location: [facility];  Service: Endoscopy;  Laterality: N/A;   EXPLORATORY LAPAROTOMY  10/18/2011   blockage small intestines   GIVENS CAPSULE STUDY N/A 05/11/2016   Procedure: GIVENS CAPSULE STUDY;  Surgeon: Ruel Kung, MD;  Location: [facility];  Service: Endoscopy;  Laterality: N/A;   Patient Active Problem List   Diagnosis Date  Noted   Subacute cough 01/19/2024   Carotid stenosis 10/22/2023   Long-term current use of benzodiazepine 08/23/2023   Lymphedema 03/29/2023   Anemia due to stage 4 chronic kidney disease (HCC) 02/04/2023   Overweight (BMI 25.0-29.9) 08/05/2022   Vision loss 08/04/2022   Senile purpura (HCC) 06/16/2022   Insomnia 06/16/2022   Varicose veins of both lower extremities without ulcer or inflammation 06/16/2022   Mixed hyperlipidemia 06/12/2022   Occipital neuralgia 06/12/2022   Sensory ataxia 06/12/2022   Osteopenia of left forearm 06/12/2022   CMC arthritis 05/23/2022   CKD (chronic kidney disease) stage 4, GFR 15-29 ml/min (HCC) 03/03/2022   Hypothyroidism, adult 03/01/2022   Loss of memory 07/26/2021   Moderate pulmonary hypertension (HCC) 05/20/2021   Primary osteoarthritis involving multiple joints 03/24/2021   Unsteady gait 10/16/2019   History of stroke 03/04/2019   Bilateral foot pain 03/06/2018   Chronic bilateral low back pain 03/06/2018   Neck pain 03/06/2018   Neuropathy 02/06/2018   History of nonmelanoma skin cancer 09/27/2016   Essential hypertension 09/02/2016   Hiatal hernia    Stricture of esophagus    Iron  deficiency anemia, unspecified 03/23/2016   History of tachycardia 07/29/2015   PCP: [name], NP  REFERRING PROVIDER: Lynwood Hue, MD  REFERRING DIAG: Rt knee pain  THERAPY DIAG:  No diagnosis found.  Rationale for Evaluation and Treatment: Rehabilitation  ONSET DATE: June 2025  SUBJECTIVE:  SUBJECTIVE STATEMENT:  ***  PERTINENT HISTORY: 88yoF referred to OPPT for acute  insidious Rt medial knee pain. Pt seen by Dr. Mardee [facility] orthopedics, per pt he recommended PT to gain muscle and strength in this knee, but generally the joint looked quite favorable. Unrelated, pt reports significant debility and difficulty walking without limiting DOE since being hospitalized last year- this has created more of activity restriction than her knee pain  alone.   PAIN:  Are you having pain? No pain today;   PRECAUTIONS: None  WEIGHT BEARING RESTRICTIONS: No  FALLS:  Has patient fallen in last 6 months? Yes. Number of falls 1  LIVING ENVIRONMENT: Lives with: lives with their family and lives with their son Stairs: No Has following equipment at home: Single point cane and Environmental consultant - 2 wheeled  OCCUPATION: retired   PLOF: drives, AMB with SPC more recently or walker last year when she left [facility], history of falls/neuropathy.   PATIENT GOALS: resolution of knee pain   OBJECTIVE:  Note: Objective measures were completed at evaluation unless otherwise noted.  DIAGNOSTIC FINDINGS:   Recently seen by orthopedics. PATIENT SURVEYS:  Attempted LEFS and KOOD, neither of which were compatible with pt's self-report capacity.  COGNITION: Overall cognitive status: globally WNL, mentation somewhat limiting to ability to response to LEFS/KOOS in a meaningful way.   EDEMA:  None seen upon visual inspection  PALPATION: Rt knee extensor mechanism; not tender, no focal or global edema/bogginess, healthy muscle mass JOINT RANGE:   ROM Right eval Left eval  Knee flexion >110*   Knee extension full full   *pt reports not limited, flexoin at baseline  FUNCTIONAL TESTS:  5xSTS: 20sec, hands free, no LOB, mild antalgia reps 4-5  477ft AMB  overground: SPC use, 8 minutes, 38 sec (11/16/23)   GAIT: Distance walked: 22 meters Assistive device utilized: SPC  Level of assistance: ModI  Comments: slow, appears to be limited more by baseline balance deficits                                                                                                                              TODAY'S TREATMENT : 01/30/24      *** Self-Care/Home Management:  Education provided on potential use of shower chair due to pt noting it being difficult to get in her tub and has refused to perform at this time since she feels unsafe in getting in.  Pt was given visual  demonstration of the use of shower chair in the OT bathroom as an option, however pt reports she is having paperwork filled out to get a walk-in shower.      TherAct: To improve functional movements patterns for everyday tasks  STS from chair hands free x10  STS from chair hands free 1x10 with 5kg ball  Ambulation around the gym, to the cafeteria and down EVS hallway, 3# AW donned each LE    Neuromuscular Re-Ed: To facilitate reeducation of movement, balance, posture, coordination, and/or proprioception/kinesthetic sense.  Static stance on Airex pad, 30  sec bouts Static NBOS on Airex pad, 30 sec bouts Static staggered stance on Airex pad, 30 sec bouts      PATIENT EDUCATION:  Education details: Pt educated throughout session about proper posture and technique with exercises. Improved exercise technique, movement at target joints, use of target muscles after min to mod verbal, visual, tactile cues. Person educated: Pt  Education method: collaborative learning, deliberate practice, positive reinforcement, explicit instruction, establish rules. Education comprehension: seems ok; FU in future to determine LT comprehension/adherence  HOME EXERCISE PROGRAM: Access Code: [email or web address] URL: [email or web address] Date: 11/14/2023 Prepared by: Peggye Linear  Exercises - Supine Knee Extension Strengthening  - 2 x daily - 1 sets - 15 reps - Supine Bridge  - 2 x daily - 1 sets - 10 reps (low range as not to aggravate chronic low back pain)  - Seated Long Arc Quad  - 2 x daily - 1 sets - 10 reps - Heel Raises with Counter Support  - 2 x daily - 1 sets - 15 reps  ASSESSMENT:  CLINICAL IMPRESSION:  ***   OBJECTIVE IMPAIRMENTS: Decreased knowledge of condition, decreased use of DME, decreased mobility, difficulty walking, decreased strength.  ACTIVITY LIMITATIONS: Lifting, standing, walking, squatting, transfers, locomotion level PARTICIPATION LIMITATIONS: Cleaning,  laundry, interpersonal relationships, driving, yardwork, community activity.  PERSONAL FACTORS: Age, behavior pattern, education, past/current experiences, transportation, profession  are also affecting patient's functional outcome.  REHAB POTENTIAL: Good.  CLINICAL DECISION MAKING: moderate  EVALUATION COMPLEXITY: Stable/uncomplicated   GOALS: Goals reviewed with patient? No  SHORT TERM GOALS: Target date: 12/15/23 Patient will report comprehension, confidence, and consistent compliance and of a simple home exercise program established to facilitate symptoms management and basic strengthening and/or segment mobility.   Baseline: issued at visit 1; 12/14/2023=Patient reports walking outside and doing most of the exercisses Goal status: MET  2.  Pt to demonstrate improvement in transfers power AEB 5xSTS<17seconds.  Baseline: 5XSTS 20sec on visit 1; 12/14/2023: 13.98 sec without UE support Goal status: MET  LONG TERM GOALS: Target date: 01/15/24 Pt to demonstrate ability to perform without pain limitation, less than 2/10 increase in NPRS, and with >10% improvement in distance to improve ability to participate in IADL and social events.    Baseline: pending assessment visit 2 ; 11/20/2023=440 feet with SPC and 550 feet with 4WW;  12/14/2023= 630 feet without UE support with report of only minimal low back pain reported during last 30 sec of walk.  01/04/24: 794' without UE support, 2/10 pain and 1 standing rest break 01/18/24: 776' without UE support, no pain, and no rest breaks Goal status: Progressing  2.  Patient to demonstrate improved performance on initial transfers assessment AEB improved reps per time or time per perform desired reps and/or reduced seat height and/or use of hands in order to improve safety, tolerance, and independence in ADL performance.    Baseline: visit 1 5xSTS: 20sec; 12/14/2023= 13.98 sec without UE support  01/04/24: 16.33 sec without UE support 01/18/24: 13.97  sec without UE Goal status: PROGRESSING  3.  Pt to report ability to perform housework and light household activities ad lib without painful restrictions to these activities.  Baseline: limited by pain at eval; 12/14/2023= Patient reports doing household chores with some intermittent rest breaks- including washing clothes, dishes, sweeping, vacuuming.  01/04/24: Pt is able to do everything without any complications other than the L hand.  Back is much better. Goal status: MET  4. Pt will improve BERG by at least  3 points in order to demonstrate clinically significant improvement in balance. Baseline: 12/14/2023= 50/56; 01/04/24: 52/56 Goal status: PROGRESSING  5.  Patient will improve with gait speed as seen by improved 10 MWT > 1.56m/s with no device vs. Cane for improved mobility and decreased risk of falling.  Baseline: 01/04/24: 0.3m/s; 01/18/24: 0.97 m/s Goal Status: PROGRESSING  PLAN:  PT FREQUENCY: 1-2x/week  PT DURATION: 8 weeks  PLANNED INTERVENTIONS: 97110-Therapeutic exercises, 97530- Therapeutic activity, 97112- Neuromuscular re-education, 97535- Self Care, 02859- Manual therapy, G0283- Electrical stimulation (unattended), [phone]- Electrical stimulation (manual), Patient/Family education, Balance training, Stair training, Joint mobilization, Joint manipulation, Visual/preceptual remediation/compensation, DME instructions, Cryotherapy, and Moist heat  PLAN FOR NEXT SESSION:   D/C     Fonda Simpers, PT, DPT Physical Therapist - [place]  [facility]  01/30/24, 7:54 AM

## 2024-02-01 ENCOUNTER — Ambulatory Visit: Admitting: Physical Therapy

## 2024-02-01 DIAGNOSIS — D485 Neoplasm of uncertain behavior of skin: Secondary | ICD-10-CM | POA: Diagnosis not present

## 2024-02-02 ENCOUNTER — Ambulatory Visit: Admitting: Physical Therapy

## 2024-02-04 ENCOUNTER — Ambulatory Visit
Admission: RE | Admit: 2024-02-04 | Discharge: 2024-02-04 | Disposition: A | Discharge status: Home / Self Care | Source: Ambulatory Visit | Attending: Neurology | Admitting: Neurology

## 2024-02-04 DIAGNOSIS — M47813 Spondylosis without myelopathy or radiculopathy, cervicothoracic region: Secondary | ICD-10-CM | POA: Diagnosis not present

## 2024-02-04 DIAGNOSIS — M5023 Other cervical disc displacement, cervicothoracic region: Secondary | ICD-10-CM | POA: Diagnosis not present

## 2024-02-04 DIAGNOSIS — M4802 Spinal stenosis, cervical region: Secondary | ICD-10-CM | POA: Diagnosis not present

## 2024-02-04 DIAGNOSIS — M542 Cervicalgia: Secondary | ICD-10-CM | POA: Diagnosis not present

## 2024-02-04 DIAGNOSIS — M5481 Occipital neuralgia: Secondary | ICD-10-CM | POA: Diagnosis not present

## 2024-02-04 DIAGNOSIS — M4319 Spondylolisthesis, multiple sites in spine: Secondary | ICD-10-CM | POA: Diagnosis not present

## 2024-02-06 ENCOUNTER — Ambulatory Visit

## 2024-02-08 ENCOUNTER — Ambulatory Visit

## 2024-02-08 DIAGNOSIS — R2681 Unsteadiness on feet: Secondary | ICD-10-CM | POA: Diagnosis not present

## 2024-02-08 DIAGNOSIS — M6281 Muscle weakness (generalized): Secondary | ICD-10-CM | POA: Diagnosis not present

## 2024-02-08 DIAGNOSIS — R269 Unspecified abnormalities of gait and mobility: Secondary | ICD-10-CM | POA: Diagnosis not present

## 2024-02-08 DIAGNOSIS — M25561 Pain in right knee: Secondary | ICD-10-CM | POA: Diagnosis not present

## 2024-02-08 DIAGNOSIS — R278 Other lack of coordination: Secondary | ICD-10-CM

## 2024-02-08 DIAGNOSIS — R262 Difficulty in walking, not elsewhere classified: Secondary | ICD-10-CM

## 2024-02-08 NOTE — Therapy (Signed)
 OUTPATIENT PHYSICAL THERAPY TREATMENT/DISCHARGE  Patient Name: Tracie Fisher MRN: [id number] DOB:09-28-1935, 88 y.o., female Today's Date: 02/08/2024  END OF SESSION:  PT End of Session - 02/08/24 1357     Visit Number 23    Number of Visits 32    Date for Recertification  02/29/24    Authorization Type [employer] Medicare    Authorization Time Period 01/04/24-02/29/24    Progress Note Due on Visit 20    PT Start Time 1400    PT Stop Time 1416    PT Time Calculation (min) 16 min    Equipment Utilized During Treatment Gait belt    Activity Tolerance Patient tolerated treatment well;No increased pain    Behavior During Therapy WFL for tasks assessed/performed           Past Medical History:  Diagnosis Date   Cancer (HCC)    SKIN   Complication of anesthesia    Depression    Dysrhythmia    GERD (gastroesophageal reflux disease)    Hiatal hernia    HOH (hard of hearing)    Hypertension    Hypothyroidism    PONV (postoperative nausea and vomiting)    Thyroid  disease    Past Surgical History:  Procedure Laterality Date   APPENDECTOMY     BACK SURGERY     MULTIPLE/ROD IN BACK   BREAST CYST ASPIRATION Left years ago   CATARACT EXTRACTION W/PHACO Left 03/21/2017   Procedure: CATARACT EXTRACTION PHACO AND INTRAOCULAR LENS PLACEMENT (IOC);  Surgeon: Jaye Fallow, MD;  Location: [facility];  Service: Ophthalmology;  Laterality: Left;  US  00:48 AP% 17.8 CDE 8.57 Fluid pack lot # [id number]   CATARACT EXTRACTION W/PHACO Right 04/11/2017   Procedure: CATARACT EXTRACTION PHACO AND INTRAOCULAR LENS PLACEMENT (IOC);  Surgeon: Jaye Fallow, MD;  Location: [facility];  Service: Ophthalmology;  Laterality: Right;  US  01:09.5 AP% 15.8 CDE 11.04 Fluid Pack lot # [id number]   CHOLECYSTECTOMY     COLON SURGERY     BOWEL OBSTRUCTION   COLONOSCOPY WITH PROPOFOL  N/A 04/25/2016   Procedure: COLONOSCOPY WITH PROPOFOL ;  Surgeon: Ruel Kung, MD;  Location: [facility];  Service:  Endoscopy;  Laterality: N/A;   ESOPHAGOGASTRODUODENOSCOPY (EGD) WITH PROPOFOL  N/A 04/25/2016   Procedure: ESOPHAGOGASTRODUODENOSCOPY (EGD) WITH PROPOFOL ;  Surgeon: Ruel Kung, MD;  Location: [facility];  Service: Endoscopy;  Laterality: N/A;   EXPLORATORY LAPAROTOMY  10/18/2011   blockage small intestines   GIVENS CAPSULE STUDY N/A 05/11/2016   Procedure: GIVENS CAPSULE STUDY;  Surgeon: Ruel Kung, MD;  Location: [facility];  Service: Endoscopy;  Laterality: N/A;   Patient Active Problem List   Diagnosis Date Noted   Subacute cough 01/19/2024   Carotid stenosis 10/22/2023   Long-term current use of benzodiazepine 08/23/2023   Lymphedema 03/29/2023   Anemia due to stage 4 chronic kidney disease (HCC) 02/04/2023   Overweight (BMI 25.0-29.9) 08/05/2022   Vision loss 08/04/2022   Senile purpura 06/16/2022   Insomnia 06/16/2022   Varicose veins of both lower extremities without ulcer or inflammation 06/16/2022   Mixed hyperlipidemia 06/12/2022   Occipital neuralgia 06/12/2022   Sensory ataxia 06/12/2022   Osteopenia of left forearm 06/12/2022   CMC arthritis 05/23/2022   CKD (chronic kidney disease) stage 4, GFR 15-29 ml/min (HCC) 03/03/2022   Hypothyroidism, adult 03/01/2022   Loss of memory 07/26/2021   Moderate pulmonary hypertension (HCC) 05/20/2021   Primary osteoarthritis involving multiple joints 03/24/2021   Unsteady gait 10/16/2019   History of stroke 03/04/2019  Bilateral foot pain 03/06/2018   Chronic bilateral low back pain 03/06/2018   Neck pain 03/06/2018   Neuropathy 02/06/2018   History of nonmelanoma skin cancer 09/27/2016   Essential hypertension 09/02/2016   Hiatal hernia    Stricture of esophagus    Iron  deficiency anemia, unspecified 03/23/2016   History of tachycardia 07/29/2015   PCP: [name], NP  REFERRING PROVIDER: Lynwood Hue, MD  REFERRING DIAG: Rt knee pain  THERAPY DIAG:  Abnormality of gait and mobility  Acute pain of right  knee  Difficulty in walking, not elsewhere classified  Muscle weakness (generalized)  Unsteadiness on feet  Other lack of coordination  Rationale for Evaluation and Treatment: Rehabilitation  ONSET DATE: June 2025  SUBJECTIVE:  SUBJECTIVE STATEMENT:  Pt reports she is ready to discharge.  Pt reports she is hoping to get home before the rain.    PERTINENT HISTORY: 88yoF referred to OPPT for acute insidious Rt medial knee pain. Pt seen by Dr. Hue [facility], per pt he recommended PT to gain muscle and strength in this knee, but generally the joint looked quite favorable. Unrelated, pt reports significant debility and difficulty walking without limiting DOE since being hospitalized last year- this has created more of activity restriction than her knee pain alone.   PAIN:  Are you having pain? No pain today;   PRECAUTIONS: None  WEIGHT BEARING RESTRICTIONS: No  FALLS:  Has patient fallen in last 6 months? Yes. Number of falls 1  LIVING ENVIRONMENT: Lives with: lives with their family and lives with their son Stairs: No Has following equipment at home: Single point cane and Environmental consultant - 2 wheeled  OCCUPATION: retired   PLOF: drives, AMB with SPC more recently or walker last year when she left [employer], history of falls/neuropathy.   PATIENT GOALS: resolution of knee pain   OBJECTIVE:  Note: Objective measures were completed at evaluation unless otherwise noted.  DIAGNOSTIC FINDINGS:   Recently seen by orthopedics. PATIENT SURVEYS:  Attempted LEFS and KOOD, neither of which were compatible with pt's self-report capacity.  COGNITION: Overall cognitive status: globally WNL, mentation somewhat limiting to ability to response to LEFS/KOOS in a meaningful way.   EDEMA:  None seen upon visual inspection  PALPATION: Rt knee extensor mechanism; not tender, no focal or global edema/bogginess, healthy muscle mass JOINT RANGE:   ROM Right eval Left eval  Knee flexion >110*    Knee extension full full   *pt reports not limited, flexoin at baseline  FUNCTIONAL TESTS:  5xSTS: 20sec, hands free, no LOB, mild antalgia reps 4-5  435ft AMB  overground: SPC use, 8 minutes, 38 sec (11/16/23)   GAIT: Distance walked: 22 meters Assistive device utilized: SPC  Level of assistance: ModI  Comments: slow, appears to be limited more by baseline balance deficits  TODAY'S TREATMENT : 02/08/24       Physical Performance Testing:  Item Test date: 02/08/24  Date:  Date:   Sitting to standing 4. able to stand without using hands and stabilize independently Insert SmartPhrase OPRCBERGREEVAL Insert SmartPhrase OPRCBERGREEVAL  2. Standing unsupported 4. able to stand safely for 2 minutes    3. Sitting with back unsupported, feet supported 4. able to sit safely and securely for 2 minutes    4. Standing to sitting 4. sits safely with minimal use of hands    5. Pivot transfer  4. able to transfer safely with minor use of hands    6. Standing unsupported with eyes closed 4. able to stand 10 seconds safely    7. Standing unsupported with feet together 4. able to place feet together independently and stand 1 minute safely    8. Reaching forward with outstretched arms while standing 4. can reach forward confidently 25 cm (10 inches)    9. Pick up object from the floor from standing 4. able to pick up slipper safely and easily    10. Turning to look behind over left and right shoulders while standing 4. looks behind from both sides and weight shifts well    11. Turn 360 degrees 4. able to turn 360 degrees safely in 4 seconds or less    12. Place alternate foot on step or stool while standing unsupported 4. ble to stand independently and safely and complete 8 steps in 20 seconds    13. Standing unsupported one foot in front 2. able to take small step independently and  hold 30 seconds    14. Standing on one leg 3. able to lift leg independently and hold 5-10 seconds     Total Score 52/56 Total Score:    Total Score:    10 Meter Walk Test: Patient instructed to walk 10 meters (32.8 ft) as quickly and as safely as possible at their normal speed Results: 0.98 m/s (10.25 sec)  Cut off scores:   Household Ambulator  < 0.4 m/s  Limited Community Ambulator  0.4 - 0.8 m/s  [employer]  > 0.8 m/s  Increased fall risk  < 1.89m/s  Crossing a Street  >1.61m/s  MCID 0.05 m/s (small), 0.13 m/s (moderate), 0.06 m/s (significant)  (ANPTA Core Set of Outcome Measures for Adults with Neurologic Conditions, 2018)          PATIENT EDUCATION:  Education details: Pt educated throughout session about proper posture and technique with exercises. Improved exercise technique, movement at target joints, use of target muscles after min to mod verbal, visual, tactile cues. Person educated: Pt  Education method: collaborative learning, deliberate practice, positive reinforcement, explicit instruction, establish rules. Education comprehension: seems ok; FU in future to determine LT comprehension/adherence  HOME EXERCISE PROGRAM: Access Code: [email or web address] URL: [email or web address] Date: 11/14/2023 Prepared by: Peggye Linear  Exercises - Supine Knee Extension Strengthening  - 2 x daily - 1 sets - 15 reps - Supine Bridge  - 2 x daily - 1 sets - 10 reps (low range as not to aggravate chronic low back pain)  - Seated Long Arc Quad  - 2 x daily - 1 sets - 10 reps - Heel Raises with Counter Support  - 2 x daily - 1 sets - 15 reps  ASSESSMENT:  CLINICAL IMPRESSION:  Pt has made significant progress towards goals, meeting 2/5 long term goals, and progressing in all 5 long term goals.  Pt reports she  feels much more secure in her ability to ambulate and feels confident in being discharged home to continue to work on the exercises that she has been given  throughout therapy sessions.  Pt encouraged to reach back out to the clinic if any questions arise, and to reach out to MD for new referral if needed.  Pt is formally discharged at this time.    OBJECTIVE IMPAIRMENTS: Decreased knowledge of condition, decreased use of DME, decreased mobility, difficulty walking, decreased strength.  ACTIVITY LIMITATIONS: Lifting, standing, walking, squatting, transfers, locomotion level PARTICIPATION LIMITATIONS: Cleaning, laundry, interpersonal relationships, driving, yardwork, community activity.  PERSONAL FACTORS: Age, behavior pattern, education, past/current experiences, transportation, profession  are also affecting patient's functional outcome.  REHAB POTENTIAL: Good.  CLINICAL DECISION MAKING: moderate  EVALUATION COMPLEXITY: Stable/uncomplicated   GOALS: Goals reviewed with patient? No  SHORT TERM GOALS: Target date: 12/15/23 Patient will report comprehension, confidence, and consistent compliance and of a simple home exercise program established to facilitate symptoms management and basic strengthening and/or segment mobility.   Baseline: issued at visit 1; 12/14/2023=Patient reports walking outside and doing most of the exercisses Goal status: MET  2.  Pt to demonstrate improvement in transfers power AEB 5xSTS<17seconds.  Baseline: 5XSTS 20sec on visit 1; 12/14/2023: 13.98 sec without UE support Goal status: MET  LONG TERM GOALS: Target date: 01/15/24 Pt to demonstrate ability to perform without pain limitation, less than 2/10 increase in NPRS, and with >10% improvement in distance to improve ability to participate in IADL and social events.    Baseline: pending assessment visit 2 ; 11/20/2023=440 feet with SPC and 550 feet with 4WW;  12/14/2023= 630 feet without UE support with report of only minimal low back pain reported during last 30 sec of walk.  01/04/24: 794' without UE support, 2/10 pain and 1 standing rest break 01/18/24: 776' without  UE support, no pain, and no rest breaks 02/08/24: Pt deferred performing this test at this time. Goal status: Progressing  2.  Patient to demonstrate improved performance on initial transfers assessment AEB improved reps per time or time per perform desired reps and/or reduced seat height and/or use of hands in order to improve safety, tolerance, and independence in ADL performance.   Baseline: visit 1 5xSTS: 20sec; 12/14/2023= 13.98 sec without UE support  01/04/24: 16.33 sec without UE support 01/18/24: 13.97 sec without UE 02/08/24: 13.77 sec without UE Goal status: MET  3.  Pt to report ability to perform housework and light household activities ad lib without painful restrictions to these activities.  Baseline: limited by pain at eval; 12/14/2023= Patient reports doing household chores with some intermittent rest breaks- including washing clothes, dishes, sweeping, vacuuming.  01/04/24: Pt is able to do everything without any complications other than the L hand.  Back is much better. Goal status: MET  4. Pt will improve BERG by at least 3 points in order to demonstrate clinically significant improvement in balance. Baseline: 12/14/2023= 50/56; 01/04/24: 52/56 02/08/24: 52/56 Goal status: PROGRESSING  5.  Patient will improve with gait speed as seen by improved 10 MWT > 1.1m/s with no device vs. Cane for improved mobility and decreased risk of falling.  Baseline: 01/04/24: 0.14m/s; 01/18/24: 0.97 m/s 02/08/24: 0.98 m/s Goal Status: PROGRESSING  PLAN:  PT FREQUENCY: 1-2x/week  PT DURATION: 8 weeks  PLANNED INTERVENTIONS: 97110-Therapeutic exercises, 97530- Therapeutic activity, 97112- Neuromuscular re-education, 97535- Self Care, 02859- Manual therapy, G0283- Electrical stimulation (unattended), [phone]- Electrical stimulation (manual), Patient/Family education, Balance training, Stair training,  Joint mobilization, Joint manipulation, Visual/preceptual remediation/compensation, DME instructions,  Cryotherapy, and Moist heat  PLAN FOR NEXT SESSION:   D/C     Fonda Simpers, PT, DPT Physical Therapist - [facility]  02/08/24, 2:27 PM

## 2024-02-13 ENCOUNTER — Ambulatory Visit

## 2024-02-15 ENCOUNTER — Ambulatory Visit

## 2024-02-16 ENCOUNTER — Ambulatory Visit (INDEPENDENT_AMBULATORY_CARE_PROVIDER_SITE_OTHER): Admitting: Nurse Practitioner

## 2024-02-16 ENCOUNTER — Encounter: Payer: Self-pay | Admitting: Nurse Practitioner

## 2024-02-16 VITALS — BP 138/81 | HR 63 | Temp 98.4°F | Resp 15 | Ht 60.0 in | Wt 144.0 lb

## 2024-02-16 DIAGNOSIS — R052 Subacute cough: Secondary | ICD-10-CM | POA: Diagnosis not present

## 2024-02-16 NOTE — Progress Notes (Signed)
 BP 138/81 (BP Location: Left Arm, Patient Position: Sitting, Cuff Size: Normal)   Pulse 63   Temp 98.4 F (36.9 C) (Oral)   Resp 15   Ht 5' (1.524 m)   Wt 144 lb (65.3 kg)   SpO2 96%   BMI 28.12 kg/m    Subjective:    Patient ID: Tracie Fisher, female    DOB: 1935-05-18, 88 y.o.   MRN: [id number]  HPI: Tracie Fisher is a 88 y.o. female  Chief Complaint  Patient presents with   Cough    Says its a little better but not much. 10/28 appt with pulmonology.    COUGH Continues to be present, last reported has been present on and off for months.  Notices it more left side of throat. Irritation to throat may last 1/2 a day and then go away. Her physical therapist had noticed and episode many weeks back of sats going to 9 with activity. Echo 08/05/22 EF 65 to 70%, there is mildly elevated pulmonary systolic pressure. Saw ENT in past for polyps. Past smoker, quit around age 40.  Smoked 2 PPD. Last CT Chest in 2023 did note moderate size hiatal hernia. Started Protonix  last visit, which is offering a little benefit. Scheduled to see pulmonary on 03/12/24. Duration: months Circumstances of initial development of cough: unknown Cough severity: mild Cough description: non-productive and dry Aggravating factors: unknown Alleviating factors: Protonix  helping a little but not a lot Status:  fluctuating Treatments attempted: Mucinex  and Robitussin + Protonix  Wheezing: no Shortness of breath: at times with acitivity Chest pain: no Chest tightness:no Nasal congestion: no Runny nose: at times Postnasal drip: at times Frequent throat clearing or swallowing: yes Hemoptysis: no Fevers: no Night sweats: no Weight loss: no Heartburn: no Recent foreign travel: no Tuberculosis contacts: no   Relevant past medical, surgical, family and social history reviewed and updated as indicated. Interim medical history since our last visit reviewed. Allergies and medications reviewed and  updated.  Review of Systems  Constitutional:  Negative for activity change, appetite change, diaphoresis, fatigue and fever.  Respiratory:  Positive for cough. Negative for chest tightness, shortness of breath and wheezing.   Cardiovascular:  Negative for chest pain, palpitations and leg swelling.  Gastrointestinal: Negative.   Neurological: Negative.   Psychiatric/Behavioral: Negative.      Per HPI unless specifically indicated above     Objective:    BP 138/81 (BP Location: Left Arm, Patient Position: Sitting, Cuff Size: Normal)   Pulse 63   Temp 98.4 F (36.9 C) (Oral)   Resp 15   Ht 5' (1.524 m)   Wt 144 lb (65.3 kg)   SpO2 96%   BMI 28.12 kg/m   Wt Readings from Last 3 Encounters:  02/16/24 144 lb (65.3 kg)  01/19/24 143 lb 12.8 oz (65.2 kg)  11/15/23 142 lb (64.4 kg)    Physical Exam Vitals and nursing note reviewed.  Constitutional:      General: She is awake. She is not in acute distress.    Appearance: She is well-developed and well-groomed. She is not ill-appearing or toxic-appearing.  HENT:     Head: Normocephalic.     Right Ear: Hearing, tympanic membrane, ear canal and external ear normal.     Left Ear: Hearing, tympanic membrane, ear canal and external ear normal.     Nose: Nose normal.     Right Sinus: No maxillary sinus tenderness or frontal sinus tenderness.     Left Sinus:  No maxillary sinus tenderness or frontal sinus tenderness.     Mouth/Throat:     Mouth: Mucous membranes are moist.     Pharynx: Posterior oropharyngeal erythema (mild) present. No pharyngeal swelling or oropharyngeal exudate.  Eyes:     General: Lids are normal.        Right eye: No discharge.        Left eye: No discharge.     Conjunctiva/sclera: Conjunctivae normal.     Pupils: Pupils are equal, round, and reactive to light.  Neck:     Thyroid : No thyromegaly.     Vascular: No carotid bruit.  Cardiovascular:     Rate and Rhythm: Normal rate and regular rhythm.     Heart  sounds: Normal heart sounds. No murmur heard.    No gallop.  Pulmonary:     Effort: Pulmonary effort is normal. No accessory muscle usage or respiratory distress.     Breath sounds: Normal breath sounds. No decreased breath sounds, wheezing or rales.     Comments: No SOB with talking.  Intermittent dry cough noted. Abdominal:     General: Bowel sounds are normal. There is no distension.     Palpations: Abdomen is soft.     Tenderness: There is no abdominal tenderness.  Musculoskeletal:     Cervical back: Normal range of motion and neck supple.     Right lower leg: No edema.     Left lower leg: No edema.  Lymphadenopathy:     Cervical: No cervical adenopathy.  Skin:    General: Skin is warm and dry.     Findings: Bruising present.     Comments: To bilateral upper extremities  Neurological:     Mental Status: She is alert and oriented to person, place, and time.     Deep Tendon Reflexes: Reflexes are normal and symmetric.     Reflex Scores:      Brachioradialis reflexes are 2+ on the right side and 2+ on the left side.      Patellar reflexes are 2+ on the right side and 2+ on the left side. Psychiatric:        Attention and Perception: Attention normal.        Mood and Affect: Mood normal.        Speech: Speech normal.        Behavior: Behavior normal. Behavior is cooperative.        Thought Content: Thought content normal.     Results for orders placed or performed in visit on 10/24/23  TSH   Collection Time: 10/24/23  2:43 PM  Result Value Ref Range   TSH 1.130 0.450 - 4.500 uIU/mL  T4, free   Collection Time: 10/24/23  2:43 PM  Result Value Ref Range   Free T4 1.28 0.82 - 1.77 ng/dL  Lipid Panel w/o Chol/HDL Ratio   Collection Time: 10/24/23  2:43 PM  Result Value Ref Range   Cholesterol, Total 186 100 - 199 mg/dL   Triglycerides 856 0 - 149 mg/dL   HDL 71 >60 mg/dL   VLDL Cholesterol Cal 24 5 - 40 mg/dL   LDL Chol Calc (NIH) 91 0 - 99 mg/dL      Assessment &  Plan:   Problem List Items Addressed This Visit       Other   Subacute cough - Primary   She reports on and off for > 2-3 months, overall lung assessment remains reassuring. ?if related to hiatal hernia or  pulmonary HTN.  Scheduled to see pulmonary upcoming.  Continue Protonix  20 MG daily, educated her on this medication and side effects to report.  No red flags on exam today.  Consider imaging if ongoing.  Is a past smoker many years ago.        Follow up plan: Return in about 3 months (around 05/27/2024) for HTN/HLD, Hypothyroid, cough.

## 2024-02-16 NOTE — Patient Instructions (Signed)

## 2024-02-16 NOTE — Assessment & Plan Note (Signed)
 She reports on and off for > 2-3 months, overall lung assessment remains reassuring. ?if related to hiatal hernia or pulmonary HTN.  Scheduled to see pulmonary upcoming.  Continue Protonix  20 MG daily, educated her on this medication and side effects to report.  No red flags on exam today.  Consider imaging if ongoing.  Is a past smoker many years ago.

## 2024-02-19 DIAGNOSIS — M4802 Spinal stenosis, cervical region: Secondary | ICD-10-CM | POA: Diagnosis not present

## 2024-02-19 DIAGNOSIS — M5412 Radiculopathy, cervical region: Secondary | ICD-10-CM | POA: Diagnosis not present

## 2024-02-20 ENCOUNTER — Ambulatory Visit: Admitting: Physical Therapy

## 2024-02-22 ENCOUNTER — Ambulatory Visit

## 2024-02-22 DIAGNOSIS — H6123 Impacted cerumen, bilateral: Secondary | ICD-10-CM | POA: Diagnosis not present

## 2024-02-22 DIAGNOSIS — H903 Sensorineural hearing loss, bilateral: Secondary | ICD-10-CM | POA: Diagnosis not present

## 2024-02-27 ENCOUNTER — Ambulatory Visit

## 2024-02-28 ENCOUNTER — Ambulatory Visit: Payer: Self-pay

## 2024-02-28 NOTE — Telephone Encounter (Signed)
 Reason for Disposition  [1] Caller has NON-URGENT medicine question about med that PCP prescribed AND [2] triager unable to answer question  Answer Assessment - Initial Assessment Questions 2. QUESTION: What is your question? (e.g., double dose of medicine, side effect)     Pt feels like she is hot all the time. She states she doesn't sweat she just feels hot. She is in the [facility] and has a fan on her most of the time. She states it was going on prior to last appt with J. Cannady but forgot to mention it. She denies any other symptoms.  Protocols used: Medication Question Call-A-AH

## 2024-02-28 NOTE — Telephone Encounter (Signed)
 FYI Only or Action Required?: Action required by provider: clinical question for provider.  Patient was last seen in primary care on 02/16/2024 by [name], NP.  Called Nurse Triage reporting Medication Reaction.  Symptoms began about a month ago.  Interventions attempted: Nothing.  Symptoms are: unchanged.  Triage Disposition: Call PCP When Office is Open  Patient/caregiver understands and will follow disposition?: Yes

## 2024-02-29 ENCOUNTER — Ambulatory Visit

## 2024-02-29 NOTE — Telephone Encounter (Signed)
 Please schedule patient for visit, can use same day/acute if needed with PCP. Thank you for the assist.

## 2024-03-01 NOTE — Telephone Encounter (Signed)
 Appt scheduled

## 2024-03-03 NOTE — Patient Instructions (Signed)

## 2024-03-04 ENCOUNTER — Ambulatory Visit: Admitting: Nurse Practitioner

## 2024-03-04 ENCOUNTER — Encounter: Payer: Self-pay | Admitting: Nurse Practitioner

## 2024-03-04 VITALS — BP 122/80 | HR 65 | Temp 98.1°F | Resp 15 | Ht 60.0 in | Wt 141.0 lb

## 2024-03-04 DIAGNOSIS — R232 Flushing: Secondary | ICD-10-CM

## 2024-03-04 DIAGNOSIS — Z23 Encounter for immunization: Secondary | ICD-10-CM

## 2024-03-04 DIAGNOSIS — E039 Hypothyroidism, unspecified: Secondary | ICD-10-CM | POA: Diagnosis not present

## 2024-03-04 NOTE — Progress Notes (Signed)
 BP 122/80 (BP Location: Left Arm, Patient Position: Sitting, Cuff Size: Normal)   Pulse 65   Temp 98.1 F (36.7 C) (Oral)   Resp 15   Ht 5' (1.524 m)   Wt 141 lb (64 kg)   SpO2 97%   BMI 27.54 kg/m    Subjective:    Patient ID: Tracie Fisher, female    DOB: February 03, 1936, 88 y.o.   MRN: [id number]  HPI: Tracie Fisher is a 88 y.o. female  Chief Complaint  Patient presents with   Hot Flashes    Started prior to last visit but forgot to mention. Wonders if medication related.    HYPOTHYROIDISM Taking Levothyroxine  50 MCG. June levels were stable.  She reports recently feeling hot all of the time, fanning with a fan a lot.  The hot episodes are noted when going to bed and getting up.  Was given Fioricet  in May for intractable chronic migraine, which she has not taken since the 6th. Saw neurology last 01/29/24, they are sending to an alternate headache provider since shots are not working.  Taking Tylenol  for her arthritis pain.  Sweats have been present since a week prior to October 3rd visit. Thyroid  control status:stable Satisfied with current treatment? yes Medication side effects: no Medication compliance: good compliance Etiology of hypothyroidism: unknown Recent dose adjustment:no Fatigue: no Cold intolerance: no Heat intolerance: no intolerance, just hot all of the time Weight gain: no Weight loss: no Constipation: yes due to too much Tylenol  Diarrhea/loose stools: no Palpitations: no Lower extremity edema: no Anxiety/depressed mood: no   Relevant past medical, surgical, family and social history reviewed and updated as indicated. Interim medical history since our last visit reviewed. Allergies and medications reviewed and updated.  Review of Systems  Constitutional:  Negative for activity change, appetite change, diaphoresis, fatigue and fever.  Respiratory:  Negative for cough, chest tightness, shortness of breath and wheezing.   Cardiovascular:  Negative for  chest pain, palpitations and leg swelling.  Gastrointestinal: Negative.   Neurological: Negative.   Psychiatric/Behavioral: Negative.     Per HPI unless specifically indicated above     Objective:    BP 122/80 (BP Location: Left Arm, Patient Position: Sitting, Cuff Size: Normal)   Pulse 65   Temp 98.1 F (36.7 C) (Oral)   Resp 15   Ht 5' (1.524 m)   Wt 141 lb (64 kg)   SpO2 97%   BMI 27.54 kg/m   Wt Readings from Last 3 Encounters:  03/04/24 141 lb (64 kg)  02/16/24 144 lb (65.3 kg)  01/19/24 143 lb 12.8 oz (65.2 kg)    Physical Exam Vitals and nursing note reviewed.  Constitutional:      General: She is awake. She is not in acute distress.    Appearance: She is well-developed and well-groomed. She is not ill-appearing or toxic-appearing.  HENT:     Head: Normocephalic.     Right Ear: Hearing and external ear normal.     Left Ear: Hearing and external ear normal.  Eyes:     General: Lids are normal.        Right eye: No discharge.        Left eye: No discharge.     Conjunctiva/sclera: Conjunctivae normal.     Pupils: Pupils are equal, round, and reactive to light.  Neck:     Thyroid : No thyromegaly.     Vascular: No carotid bruit.  Cardiovascular:     Rate and Rhythm:  Normal rate and regular rhythm.     Heart sounds: Normal heart sounds. No murmur heard.    No gallop.  Pulmonary:     Effort: Pulmonary effort is normal. No accessory muscle usage or respiratory distress.     Breath sounds: Normal breath sounds.  Abdominal:     General: Bowel sounds are normal. There is no distension.     Palpations: Abdomen is soft.     Tenderness: There is no abdominal tenderness.  Musculoskeletal:     Cervical back: Normal range of motion and neck supple.     Right lower leg: No edema.     Left lower leg: No edema.  Lymphadenopathy:     Cervical: No cervical adenopathy.  Skin:    General: Skin is warm and dry.     Findings: Bruising present.     Comments: To bilateral  upper extremities  Neurological:     Mental Status: She is alert and oriented to person, place, and time.     Deep Tendon Reflexes: Reflexes are normal and symmetric.     Reflex Scores:      Brachioradialis reflexes are 2+ on the right side and 2+ on the left side.      Patellar reflexes are 2+ on the right side and 2+ on the left side. Psychiatric:        Attention and Perception: Attention normal.        Mood and Affect: Mood normal.        Speech: Speech normal.        Behavior: Behavior normal. Behavior is cooperative.        Thought Content: Thought content normal.     Results for orders placed or performed in visit on 10/24/23  TSH   Collection Time: 10/24/23  2:43 PM  Result Value Ref Range   TSH 1.130 0.450 - 4.500 uIU/mL  T4, free   Collection Time: 10/24/23  2:43 PM  Result Value Ref Range   Free T4 1.28 0.82 - 1.77 ng/dL  Lipid Panel w/o Chol/HDL Ratio   Collection Time: 10/24/23  2:43 PM  Result Value Ref Range   Cholesterol, Total 186 100 - 199 mg/dL   Triglycerides 856 0 - 149 mg/dL   HDL 71 >60 mg/dL   VLDL Cholesterol Cal 24 5 - 40 mg/dL   LDL Chol Calc (NIH) 91 0 - 99 mg/dL      Assessment & Plan:   Problem List Items Addressed This Visit       Endocrine   Hypothyroidism, adult - Primary   Chronic, ongoing.  Continue current medication regimen and adjust as needed based on labs.  TSH, Free T4 today due to more issues with feeling hot recently. ?thyroid  or related to her Fioricet  use recently. Check BMP and CBC today too.      Relevant Orders   T4, free   TSH   Basic metabolic panel with GFR   T4, free   TSH   CBC with Differential/Platelet   Other Visit Diagnoses       Hot flashes       Refer to hypothyroid plan of care.   Relevant Orders   Basic metabolic panel with GFR   T4, free   TSH   CBC with Differential/Platelet        Follow up plan: Return for as scheduled in future with PCP.

## 2024-03-04 NOTE — Addendum Note (Signed)
 Addended by: Tayte Childers M on: 03/04/2024 02:04 PM   Modules accepted: Orders

## 2024-03-04 NOTE — Assessment & Plan Note (Signed)
 Chronic, ongoing.  Continue current medication regimen and adjust as needed based on labs.  TSH, Free T4 today due to more issues with feeling hot recently. ?thyroid  or related to her Fioricet  use recently. Check BMP and CBC today too.

## 2024-03-05 ENCOUNTER — Ambulatory Visit

## 2024-03-05 ENCOUNTER — Ambulatory Visit: Payer: Self-pay

## 2024-03-05 ENCOUNTER — Ambulatory Visit: Payer: Self-pay | Admitting: Nurse Practitioner

## 2024-03-05 LAB — CBC WITH DIFFERENTIAL/PLATELET
Basophils Absolute: 0 x10E3/uL (ref 0.0–0.2)
Basos: 1 %
EOS (ABSOLUTE): 0.3 x10E3/uL (ref 0.0–0.4)
Eos: 4 %
Hematocrit: 39.6 % (ref 34.0–46.6)
Hemoglobin: 13.2 g/dL (ref 11.1–15.9)
Immature Grans (Abs): 0 x10E3/uL (ref 0.0–0.1)
Immature Granulocytes: 0 %
Lymphocytes Absolute: 2 x10E3/uL (ref 0.7–3.1)
Lymphs: 32 %
MCH: 31.6 pg (ref 26.6–33.0)
MCHC: 33.3 g/dL (ref 31.5–35.7)
MCV: 95 fL (ref 79–97)
Monocytes Absolute: 0.6 x10E3/uL (ref 0.1–0.9)
Monocytes: 10 %
Neutrophils Absolute: 3.3 x10E3/uL (ref 1.4–7.0)
Neutrophils: 53 %
Platelets: 220 x10E3/uL (ref 150–450)
RBC: 4.18 x10E6/uL (ref 3.77–5.28)
RDW: 13 % (ref 11.7–15.4)
WBC: 6.3 x10E3/uL (ref 3.4–10.8)

## 2024-03-05 LAB — BASIC METABOLIC PANEL WITH GFR
BUN/Creatinine Ratio: 26 (ref 12–28)
BUN: 36 mg/dL — ABNORMAL HIGH (ref 8–27)
CO2: 24 mmol/L (ref 20–29)
Calcium: 9.7 mg/dL (ref 8.7–10.3)
Chloride: 98 mmol/L (ref 96–106)
Creatinine, Ser: 1.36 mg/dL — ABNORMAL HIGH (ref 0.57–1.00)
Glucose: 100 mg/dL — ABNORMAL HIGH (ref 70–99)
Potassium: 3.9 mmol/L (ref 3.5–5.2)
Sodium: 139 mmol/L (ref 134–144)
eGFR: 38 mL/min/1.73 — ABNORMAL LOW (ref >59)

## 2024-03-05 LAB — T4, FREE: Free T4: 1.28 ng/dL (ref 0.82–1.77)

## 2024-03-05 LAB — TSH: TSH: 1.17 u[IU]/mL (ref 0.450–4.500)

## 2024-03-05 NOTE — Telephone Encounter (Signed)
 FYI Only or Action Required?: FYI only for provider.  Patient was last seen in primary care on 03/04/2024 by [name], NP.  Called Nurse Triage reporting Hip Pain.  Symptoms began today.  Interventions attempted: Nothing.  Symptoms are: stable.  Triage Disposition: Home Care  Patient/caregiver understands and will follow disposition?: Yes       Copied from CRM #[id number]. Topic: Clinical - Red Word Triage >> Mar 05, 2024  2:06 PM Tracie Fisher wrote: Red Word that prompted transfer to Nurse Triage: Patient called in stating she Couldn't move hips/hands this morning wonder if COVID shot could've caused it. Reason for Disposition  COVID-19 normal vaccine reactions: LOCAL reactions (pain, swelling, redness at injection site) and normal SYSTEMIC  reactions (fever, chills, feeling tired, muscle aches, headache, etc)  Answer Assessment - Initial Assessment Questions Pt states she has covid vaccine yesterday and today woke up with pain in her hips and hands. She states she was barely able to get out of bed. She has been able to get up out of bed but just wanted to know if this was a side effect as she has never had a reaction previously.    1. MAIN CONCERN: What is your main concern or question?     Joint pain 2. INJECTION SITE SYMPTOMS :   What are the main symptoms? (redness, swelling or pain around injection site or none) For redness, ask: How large is the area of red skin? (inches or cm)     no 3. SYSTEMIC WHOLE BODY SYMPTOMS: What is the main symptom? (e.g. fever, chills, headache, tired, poor appetite, or none)     Hip pain, hand pain 4. ONSET: When was the vaccine (shot) given? How much later did the pain begin? (Hours or days) This question mainly refers to the onset of redness or fever.     Shot was given yesterday  6. FEVER: If a fever is reported, ask: What is it, how was it measured, and when did it start?      No fever 7. PAST REACTIONS: If applicable:  Has your child reacted to this vaccine before? If so, ask: What happened?     no  Protocols used: COVID-19 Vaccine Reactions and Questions-P-AH

## 2024-03-05 NOTE — Progress Notes (Signed)
 Good day, please let Tracie Fisher know her labs have returned and overall remain at baseline and stable.  No medication changes needed at this time. Suspect her intermittent hot flashes may be related to one or two of her medications as we discussed.  Any questions? Keep being stellar!!  Thank you for allowing me to participate in your care.  I appreciate you. Kindest regards, Islah Eve

## 2024-03-07 NOTE — Telephone Encounter (Signed)
 Ok for E2C2 to review.  Please advise patient that after receiving the COVID vaccine the body aches she is reporting are common but if still not feeling well over the weekend she should be seen in UC or call Monday to see if we have any available appointment depending on symptoms severity.

## 2024-03-08 NOTE — Telephone Encounter (Signed)
 Copied from CRM [id number]. Topic: Clinical - Lab/Test Results >> Mar 08, 2024  2:17 PM Rosaria E wrote: Reason for CRM: Pt has questions for clinic regarding her labs results, wants medications could be causing her intermittent hot flashes? Please advise.  Best contact: [phone]

## 2024-03-12 ENCOUNTER — Encounter: Payer: Self-pay | Admitting: Pulmonary Disease

## 2024-03-12 ENCOUNTER — Ambulatory Visit (INDEPENDENT_AMBULATORY_CARE_PROVIDER_SITE_OTHER): Admitting: Pulmonary Disease

## 2024-03-12 VITALS — BP 130/84 | HR 69 | Temp 97.9°F | Ht 60.0 in | Wt 140.0 lb

## 2024-03-12 DIAGNOSIS — N1832 Chronic kidney disease, stage 3b: Secondary | ICD-10-CM | POA: Diagnosis not present

## 2024-03-12 DIAGNOSIS — I272 Pulmonary hypertension, unspecified: Secondary | ICD-10-CM | POA: Diagnosis not present

## 2024-03-12 DIAGNOSIS — R052 Subacute cough: Secondary | ICD-10-CM | POA: Diagnosis not present

## 2024-03-12 DIAGNOSIS — R0602 Shortness of breath: Secondary | ICD-10-CM

## 2024-03-12 NOTE — Patient Instructions (Signed)
 VISIT SUMMARY:  During your visit, we discussed your ongoing issues with shortness of breath and cough, which have been present for about three months. You have experienced significant coughing episodes, likely due to post-nasal drip and possible allergies. We also reviewed your history of mild pulmonary hypertension, nasal dryness, and epistaxis, as well as your chronic kidney disease and venous insufficiency. We have planned several tests and follow-up appointments to monitor and manage these conditions.  YOUR PLAN:  -CHRONIC COUGH: Your chronic cough, which has lasted for three months, is likely due to post-nasal drip and possible allergies. We have noticed some recent improvement. To further evaluate your lung function, we will schedule pulmonary function tests.  -PULMONARY HYPERTENSION: Pulmonary hypertension is high blood pressure in the lungs' arteries. You were diagnosed with mild pulmonary hypertension last year. To assess your current condition, we will order an echocardiogram.  -NASAL DRYNESS AND EPISTAXIS: Nasal dryness and occasional nosebleeds are likely due to the dry air from using home heating. We recommend using a saline spray to keep your nasal passages moist.  -VENOUS INSUFFICIENCY: Venous insufficiency is a condition where the veins have trouble sending blood from the legs back to the heart, causing leg swelling. This is being managed by your leg specialist.  -CHRONIC KIDNEY DISEASE: Chronic kidney disease means your kidneys are damaged and can't filter blood as well as they should. Your condition has improved with adjustments to your water intake. Continue to limit your water intake to four bottles daily.  -GENERAL HEALTH MAINTENANCE: You have received your COVID-19 vaccination and are pending your influenza vaccination. Continue to follow general health recommendations and maintain a healthy lifestyle.  INSTRUCTIONS:  Please schedule the following: a follow-up appointment  in two months, an echocardiogram, and pulmonary function tests.

## 2024-03-12 NOTE — Progress Notes (Signed)
 Subjective:    Patient ID: Tracie Fisher, female    DOB: 09-09-1935, 88 y.o.   MRN: [id number]  Patient Care Team: [name], NP as PCP - General (Nurse Practitioner) Jacobo Evalene PARAS, MD as Consulting Physician (Oncology) Dennise Capri, MD as Consulting Physician (Nephrology) Florencio Cara BIRCH, MD as Consulting Physician (Cardiology) Jaye Fallow, MD as Referring Physician (Ophthalmology) Jama, Cordella MATSU, MD as Consulting Physician (Vascular Surgery) Lane Arthea BRAVO, MD as Referring Physician (Neurology) Maree Jannett POUR, MD as Consulting Physician (Neurology)  Chief Complaint  Patient presents with   Consult    BACKGROUND: Patient is an 88 year old former smoker who presents for issues with a subacute cough now resolved.  She also has issues with pulmonary hypertension.  She is kindly referred by Jolene Cannady, NP.   HPI Discussed the use of AI scribe software for clinical note transcription with the patient, who gave verbal consent to proceed.  History of Present Illness   Tracie Fisher is an 88 year old female who presents with shortness of breath and cough. She was referred by Dr. Jolene Cannady, NP for evaluation of her lungs.  She has been experiencing drainage on the right side of her throat, leading to significant coughing episodes severe enough to cause her to pull over while driving. This has been ongoing for about three months, but she notes an improvement in the last few days. She suspects allergies might be a contributing factor, although she has never been allergic to pollen before.  No current shortness of breath unless she walks too far, which she attributes to her recent therapy sessions aimed at improving her walking ability. She completed three months of therapy after being hospitalized last year, which resulted in temporary loss of her ability to walk.  History of smoking but quit many years ago. No use of inhalers and no current heart  palpitations or chest pain, although she has a history of irregular heartbeat. She follows up with a cardiologist at [facility].  She experiences daily leg swelling and is under the care of a leg specialist. She also has kidney disease and is monitored by a nephrologist. Her kidney function had previously declined to the point of considering dialysis, but has since improved after adjusting her water intake from eight bottles to four per day.  Reports nasal congestion and occasional epistaxis, which she attributes to the recent use of heating in her home. No regular nasal congestion but experienced blood from both nostrils after blowing her nose recently.  Mentions feeling unusually hot, especially at night, despite normal thyroid  function tests. She uses a fan to manage this sensation.        Review of Systems A 10 point review of systems was performed and it is as noted above otherwise negative.   Past Medical History:  Diagnosis Date   Cancer (HCC)    SKIN   Complication of anesthesia    Depression    Dysrhythmia    GERD (gastroesophageal reflux disease)    Hiatal hernia    HOH (hard of hearing)    Hypertension    Hypothyroidism    PONV (postoperative nausea and vomiting)    Thyroid  disease     Past Surgical History:  Procedure Laterality Date   APPENDECTOMY     BACK SURGERY     MULTIPLE/ROD IN BACK   BREAST CYST ASPIRATION Left years ago   CATARACT EXTRACTION W/PHACO Left 03/21/2017   Procedure: CATARACT EXTRACTION PHACO AND  INTRAOCULAR LENS PLACEMENT (IOC);  Surgeon: Jaye Fallow, MD;  Location: [facility];  Service: Ophthalmology;  Laterality: Left;  US  00:48 AP% 17.8 CDE 8.57 Fluid pack lot # [id number]   CATARACT EXTRACTION W/PHACO Right 04/11/2017   Procedure: CATARACT EXTRACTION PHACO AND INTRAOCULAR LENS PLACEMENT (IOC);  Surgeon: Jaye Fallow, MD;  Location: [facility];  Service: Ophthalmology;  Laterality: Right;  US  01:09.5 AP% 15.8 CDE 11.04 Fluid  Pack lot # [id number]   CHOLECYSTECTOMY     COLON SURGERY     BOWEL OBSTRUCTION   COLONOSCOPY WITH PROPOFOL  N/A 04/25/2016   Procedure: COLONOSCOPY WITH PROPOFOL ;  Surgeon: Ruel Kung, MD;  Location: [facility];  Service: Endoscopy;  Laterality: N/A;   ESOPHAGOGASTRODUODENOSCOPY (EGD) WITH PROPOFOL  N/A 04/25/2016   Procedure: ESOPHAGOGASTRODUODENOSCOPY (EGD) WITH PROPOFOL ;  Surgeon: Ruel Kung, MD;  Location: [facility];  Service: Endoscopy;  Laterality: N/A;   EXPLORATORY LAPAROTOMY  10/18/2011   blockage small intestines   GIVENS CAPSULE STUDY N/A 05/11/2016   Procedure: GIVENS CAPSULE STUDY;  Surgeon: Ruel Kung, MD;  Location: [facility];  Service: Endoscopy;  Laterality: N/A;    Patient Active Problem List   Diagnosis Date Noted   Carotid stenosis 10/22/2023   Long-term current use of benzodiazepine 08/23/2023   Lymphedema 03/29/2023   Anemia due to stage 4 chronic kidney disease (HCC) 02/04/2023   Overweight (BMI 25.0-29.9) 08/05/2022   Vision loss 08/04/2022   Senile purpura 06/16/2022   Insomnia 06/16/2022   Varicose veins of both lower extremities without ulcer or inflammation 06/16/2022   Mixed hyperlipidemia 06/12/2022   Occipital neuralgia 06/12/2022   Sensory ataxia 06/12/2022   Osteopenia of left forearm 06/12/2022   CMC arthritis 05/23/2022   CKD (chronic kidney disease) stage 4, GFR 15-29 ml/min (HCC) 03/03/2022   Hypothyroidism, adult 03/01/2022   Loss of memory 07/26/2021   Moderate pulmonary hypertension (HCC) 05/20/2021   Primary osteoarthritis involving multiple joints 03/24/2021   Unsteady gait 10/16/2019   History of stroke 03/04/2019   Bilateral foot pain 03/06/2018   Chronic bilateral low back pain 03/06/2018   Neck pain 03/06/2018   Neuropathy 02/06/2018   History of nonmelanoma skin cancer 09/27/2016   Essential hypertension 09/02/2016   Hiatal hernia    Stricture of esophagus    Iron  deficiency anemia, unspecified 03/23/2016    History of tachycardia 07/29/2015    Family History  Problem Relation Age of Onset   Other Mother        unknown medical history   Heart disease Father    Breast cancer Sister 32   Heart attack Brother     Social History   Tobacco Use   Smoking status: Former    Current packs/day: 0.00    Average packs/day: 2.0 packs/day for 35.0 years (70.0 ttl pk-yrs)    Types: Cigarettes    Start date: 89    Quit date: 33    Years since quitting: 35.8   Smokeless tobacco: Never  Substance Use Topics   Alcohol use: Not Currently    Comment: 1 beer /day or 2 occ, last use ~2023    Allergies  Allergen Reactions   Aspirin Itching   Penicillins Other (See Comments)    Unknown reaction Has patient had a PCN reaction causing immediate rash, facial/tongue/throat swelling, SOB or lightheadedness with hypotension: Unknown Has patient had a PCN reaction causing severe rash involving mucus membranes or skin necrosis: Unknown Has patient had a PCN reaction that required hospitalization: Unknown Has patient had a PCN reaction  occurring within the last 10 years: No If all of the above answers are NO, then may proceed with Cephalosporin use. Patient does not remember the reaction to penicillin.   Penicillin G     Unknown reaction Has patient had a PCN reaction causing immediate rash, facial/tongue/throat swelling, SOB or lightheadedness with hypotension: Unknown Has patient had a PCN reaction causing severe rash involving mucus membranes or skin necrosis: Unknown Has patient had a PCN reaction that required hospitalization: Unknown Has patient had a PCN reaction occurring within the last 10 years: No If all of the above answers are NO, then may proceed with Cephalosporin use.     Current Meds  Medication Sig   acetaminophen  (TYLENOL ) 325 MG tablet Take 650 mg by mouth every 6 (six) hours as needed.   ALPRAZolam  (XANAX ) 1 MG tablet Take 0.5 mg (1/2 tablet) by mouth in the morning and 1  mg (1 tablet) by mouth at bedtime.   butalbital -acetaminophen -caffeine  (FIORICET ) 50-325-40 MG tablet Take 1-2 tablets by mouth every 6 (six) hours as needed for headache.   Cholecalciferol (VITAMIN D3) 50 MCG (2000 UT) CAPS Take 2,000 Units by mouth daily.   clopidogrel  (PLAVIX ) 75 MG tablet Take 1 tablet (75 mg total) by mouth daily.   cyanocobalamin  (V-R VITAMIN B-12) 500 MCG tablet Take 500 mcg by mouth daily.    ferrous gluconate  (FERGON) 240 (27 FE) MG tablet Take 240 mg by mouth 2 (two) times daily.   levothyroxine  (SYNTHROID ) 50 MCG tablet Take 1 tablet (50 mcg total) by mouth daily before breakfast.   metolazone (ZAROXOLYN) 2.5 MG tablet Take 2.5 mg by mouth once a week.   pantoprazole  (PROTONIX ) 20 MG tablet Take 1 tablet (20 mg total) by mouth daily.   pravastatin  (PRAVACHOL ) 20 MG tablet Take 1 tablet (20 mg total) by mouth every evening.   pregabalin  (LYRICA ) 75 MG capsule Take 1 capsule (75 mg total) by mouth 3 (three) times daily.   torsemide  (DEMADEX ) 20 MG tablet Take 1 tablet (20 mg total) by mouth daily.    Immunization History  Administered Date(s) Administered   Fluad Trivalent(High Dose 65+) 02/03/2023   PFIZER Comirnaty(Gray Top)Covid-19 Tri-Sucrose Vaccine 10/02/2020   PFIZER(Purple Top)SARS-COV-2 Vaccination 06/06/2019, 06/27/2019, 02/17/2020   Pfizer Covid-19 Vaccine Bivalent Booster 9yrs & up 07/20/2021   Pfizer(Comirnaty)Fall Seasonal Vaccine 12 years and older 02/17/2023, 03/04/2024   Pneumococcal Conjugate-13 04/07/2015   Td 10/26/2022   Tdap 05/07/2015   Zoster, Live 03/28/2013        Objective:     BP 130/84   Pulse 69   Temp 97.9 F (36.6 C) (Temporal)   Ht 5' (1.524 m)   Wt 140 lb (63.5 kg)   SpO2 95%   BMI 27.34 kg/m   SpO2: 95 %  GENERAL: Well-developed, well nourished woman, no acute distress ambulates with assistance of a cane.  No conversational dyspnea. HEAD: Normocephalic, atraumatic.  EYES: Pupils equal, round, reactive to  light.  No scleral icterus.  MOUTH: Dentition intact, oral mucosa moist.  No thrush. NECK: Supple. No thyromegaly. Trachea midline. No JVD.  No adenopathy. PULMONARY: Good air entry bilaterally.  No adventitious sounds. CARDIOVASCULAR: S1 and S2. Regular rate and rhythm.  No rubs, murmurs or gallops heard. ABDOMEN: Benign. MUSCULOSKELETAL: No joint deformity, no clubbing, no edema.  NEUROLOGIC: No overt focal deficit, gait not tested, speech is fluent. SKIN: Intact,warm,dry.  Venous stasis changes bilaterally PSYCH: Occasionally befuddled.  Otherwise normal mood and behavior.  Reviewed available imaging and echocardiogram  from 05 August 2022 this showed only and mild mitral valve regurgitation.      Assessment & Plan:     ICD-10-CM   1. Shortness of breath  R06.02 Pulmonary function test    ECHOCARDIOGRAM COMPLETE    2. Pulmonary HTN (HCC)  I27.20 ECHOCARDIOGRAM COMPLETE    3. Subacute cough - RESOLVED  R05.2     4. CKD stage 3b, GFR 30-44 ml/min (HCC)  N18.32       Orders Placed This Encounter  Procedures   ECHOCARDIOGRAM COMPLETE    Standing Status:   Future    Expiration Date:   03/12/2025    Where should this test be performed:   [facility]    Perflutren DEFINITY (image enhancing agent) should be administered unless hypersensitivity or allergy exist:   Administer Perflutren    Reason for exam-Echo:   Pulmonary hypertension I27.2   Pulmonary function test    Standing Status:   Future    Expiration Date:   03/12/2025    Where should this test be performed?:   [facility]    What type of PFT is being ordered?:   Full PFT   Discussion:    Subacute cough, now resolved Subacute cough for three months with post-nasal drip and unilateral throat congestion, likely due to allergies. Recent improvement noted, not lung-related. - Schedule pulmonary function tests  Pulmonary Hypertension Mild pulmonary hypertension identified last year. Echocardiogram ordered  to evaluate current status. - Order echocardiogram  Nasal Dryness and Epistaxis Nasal dryness and epistaxis following home heating use, likely due to dry air. - Recommend saline nasal spray  Venous Insufficiency Daily leg swelling due to venous insufficiency, managed by a specialist.  Chronic Kidney Disease Chronic kidney disease managed by Dr. Dennise. Previous significant decline in kidney function improved with fluid intake adjustments. Advised to limit water intake to four bottles daily.  General Health Maintenance Received COVID-19 vaccination. Pending influenza vaccination.  Follow-up Follow-up appointments and tests scheduled to monitor conditions. - Schedule follow-up appointment in two months - Schedule echocardiogram - Schedule pulmonary function tests       Advised if symptoms do not improve or worsen, to please contact office for sooner follow up or seek emergency care.    I spent 45 minutes of dedicated to the care of this patient on the date of this encounter to include pre-visit review of records, face-to-face time with the patient discussing conditions above, post visit ordering of testing, clinical documentation with the electronic health record, making appropriate referrals as documented, and communicating necessary findings to members of the patients care team.   C. Leita Sanders, MD Advanced Bronchoscopy [facility]    *This note was dictated using voice recognition software/Dragon.  Despite best efforts to proofread, errors can occur which can change the meaning. Any transcriptional errors that result from this process are unintentional and may not be fully corrected at the time of dictation.

## 2024-03-13 NOTE — Telephone Encounter (Signed)
 Discussed more with patient. She is aware and says she won't take the butalbital -acetaminophen -caffeine  (FIORICET ) 50-325-40 MG tablet. She knows that if it doesn't resolve over the next few weeks then she is to call and schedule a follow up sooner.

## 2024-03-14 DIAGNOSIS — M5412 Radiculopathy, cervical region: Secondary | ICD-10-CM | POA: Diagnosis not present

## 2024-03-22 ENCOUNTER — Ambulatory Visit
Admission: RE | Admit: 2024-03-22 | Discharge: 2024-03-22 | Disposition: A | Discharge status: Home / Self Care | Source: Ambulatory Visit | Attending: Pulmonary Disease | Admitting: Pulmonary Disease

## 2024-03-22 DIAGNOSIS — I272 Pulmonary hypertension, unspecified: Secondary | ICD-10-CM | POA: Diagnosis present

## 2024-03-22 DIAGNOSIS — R0602 Shortness of breath: Secondary | ICD-10-CM | POA: Insufficient documentation

## 2024-03-22 DIAGNOSIS — I129 Hypertensive chronic kidney disease with stage 1 through stage 4 chronic kidney disease, or unspecified chronic kidney disease: Secondary | ICD-10-CM | POA: Diagnosis not present

## 2024-03-22 DIAGNOSIS — N189 Chronic kidney disease, unspecified: Secondary | ICD-10-CM | POA: Insufficient documentation

## 2024-03-22 NOTE — Progress Notes (Signed)
  Echocardiogram 2D Echocardiogram has been performed.  Tracie Fisher 03/22/2024, 12:54 PM

## 2024-03-23 LAB — ECHOCARDIOGRAM COMPLETE
Area-P 1/2: 3.31 cm2
P 1/2 time: 552 ms
S' Lateral: 3 cm

## 2024-03-27 ENCOUNTER — Ambulatory Visit: Payer: Self-pay | Admitting: Pulmonary Disease

## 2024-03-27 NOTE — Progress Notes (Signed)
 LVMTCB to receive result note.

## 2024-04-17 ENCOUNTER — Inpatient Hospital Stay: Attending: Oncology

## 2024-04-17 ENCOUNTER — Other Ambulatory Visit

## 2024-04-17 DIAGNOSIS — N189 Chronic kidney disease, unspecified: Secondary | ICD-10-CM | POA: Diagnosis present

## 2024-04-17 DIAGNOSIS — I129 Hypertensive chronic kidney disease with stage 1 through stage 4 chronic kidney disease, or unspecified chronic kidney disease: Secondary | ICD-10-CM | POA: Diagnosis present

## 2024-04-17 DIAGNOSIS — Z803 Family history of malignant neoplasm of breast: Secondary | ICD-10-CM | POA: Insufficient documentation

## 2024-04-17 DIAGNOSIS — Z862 Personal history of diseases of the blood and blood-forming organs and certain disorders involving the immune mechanism: Secondary | ICD-10-CM | POA: Insufficient documentation

## 2024-04-17 DIAGNOSIS — D509 Iron deficiency anemia, unspecified: Secondary | ICD-10-CM

## 2024-04-17 DIAGNOSIS — Z87891 Personal history of nicotine dependence: Secondary | ICD-10-CM | POA: Insufficient documentation

## 2024-04-17 DIAGNOSIS — Z79899 Other long term (current) drug therapy: Secondary | ICD-10-CM | POA: Insufficient documentation

## 2024-04-17 LAB — CBC WITH DIFFERENTIAL/PLATELET
Abs Immature Granulocytes: 0.03 K/uL (ref 0.00–0.07)
Basophils Absolute: 0.1 K/uL (ref 0.0–0.1)
Basophils Relative: 1 %
Eosinophils Absolute: 0.3 K/uL (ref 0.0–0.5)
Eosinophils Relative: 3 %
HCT: 40.1 % (ref 36.0–46.0)
Hemoglobin: 13.6 g/dL (ref 12.0–15.0)
Immature Granulocytes: 0 %
Lymphocytes Relative: 35 %
Lymphs Abs: 3 K/uL (ref 0.7–4.0)
MCH: 31.4 pg (ref 26.0–34.0)
MCHC: 33.9 g/dL (ref 30.0–36.0)
MCV: 92.6 fL (ref 80.0–100.0)
Monocytes Absolute: 0.9 K/uL (ref 0.1–1.0)
Monocytes Relative: 10 %
Neutro Abs: 4.4 K/uL (ref 1.7–7.7)
Neutrophils Relative %: 51 %
Platelets: 245 K/uL (ref 150–400)
RBC: 4.33 MIL/uL (ref 3.87–5.11)
RDW: 12 % (ref 11.5–15.5)
WBC: 8.6 K/uL (ref 4.0–10.5)
nRBC: 0 % (ref 0.0–0.2)

## 2024-04-17 LAB — IRON AND TIBC
Iron: 71 ug/dL (ref 28–170)
Saturation Ratios: 21 % (ref 10.4–31.8)
TIBC: 339 ug/dL (ref 250–450)
UIBC: 268 ug/dL

## 2024-04-17 LAB — FERRITIN: Ferritin: 86 ng/mL (ref 11–307)

## 2024-04-18 ENCOUNTER — Inpatient Hospital Stay

## 2024-04-18 ENCOUNTER — Encounter: Payer: Self-pay | Admitting: Oncology

## 2024-04-18 ENCOUNTER — Inpatient Hospital Stay: Admitting: Oncology

## 2024-04-18 VITALS — BP 121/68 | HR 54 | Temp 97.8°F | Resp 16 | Ht 60.0 in | Wt 144.0 lb

## 2024-04-18 DIAGNOSIS — D509 Iron deficiency anemia, unspecified: Secondary | ICD-10-CM | POA: Diagnosis not present

## 2024-04-18 DIAGNOSIS — I129 Hypertensive chronic kidney disease with stage 1 through stage 4 chronic kidney disease, or unspecified chronic kidney disease: Secondary | ICD-10-CM | POA: Diagnosis not present

## 2024-04-18 NOTE — Progress Notes (Signed)
 [facility]  [phone] Fax:([phone]) [phone]  ID: Tracie Fisher OB: 1935/07/21  MR#: [id number]  RDW#:[id number]  Patient Care Team: [name], NP as PCP - General (Nurse Practitioner) Jacobo Evalene PARAS, MD as Consulting Physician (Oncology) Dennise Capri, MD as Consulting Physician (Nephrology) Florencio Cara BIRCH, MD as Consulting Physician (Cardiology) Jaye Fallow, MD as Referring Physician (Ophthalmology) Jama, Cordella MATSU, MD as Consulting Physician (Vascular Surgery) Lane Arthea BRAVO, MD as Referring Physician (Neurology) Maree Jannett POUR, MD as Consulting Physician (Neurology)  CHIEF COMPLAINT: Iron  deficiency anemia.  INTERVAL HISTORY: Patient returns to clinic today for repeat laboratory, further evaluation, and consideration of additional IV Venofer .  She continues to feel well and remains asymptomatic.  She does not complain of any weakness or fatigue today.  She has no neurologic complaints.  She denies any recent fevers or illnesses.  She has a good appetite and denies weight loss.  She has no chest pain, shortness of breath, cough, or hemoptysis.  She denies any nausea, vomiting, constipation, or diarrhea.  She has no melena or hematochezia.  She has no urinary complaints.  Patient offers no specific complaints today.  REVIEW OF SYSTEMS:   Review of Systems  Constitutional: Negative.  Negative for fever, malaise/fatigue and weight loss.  Respiratory:  Negative for cough, hemoptysis and shortness of breath.   Cardiovascular: Negative.  Negative for chest pain and leg swelling.  Gastrointestinal: Negative.  Negative for abdominal pain, blood in stool and melena.  Genitourinary: Negative.  Negative for hematuria.  Musculoskeletal: Negative.  Negative for back pain.  Skin: Negative.  Negative for rash.  Neurological: Negative.  Negative for dizziness, focal weakness, weakness and headaches.  Psychiatric/Behavioral: Negative.  The  patient is not nervous/anxious.     As per HPI. Otherwise, a complete review of systems is negative.  PAST MEDICAL HISTORY: Past Medical History:  Diagnosis Date   Cancer (HCC)    SKIN   Complication of anesthesia    Depression    Dysrhythmia    GERD (gastroesophageal reflux disease)    Hiatal hernia    HOH (hard of hearing)    Hypertension    Hypothyroidism    PONV (postoperative nausea and vomiting)    Thyroid  disease     PAST SURGICAL HISTORY: Past Surgical History:  Procedure Laterality Date   APPENDECTOMY     BACK SURGERY     MULTIPLE/ROD IN BACK   BREAST CYST ASPIRATION Left years ago   CATARACT EXTRACTION W/PHACO Left 03/21/2017   Procedure: CATARACT EXTRACTION PHACO AND INTRAOCULAR LENS PLACEMENT (IOC);  Surgeon: Jaye Fallow, MD;  Location: [facility] ORS;  Service: Ophthalmology;  Laterality: Left;  US  00:48 AP% 17.8 CDE 8.57 Fluid pack lot # [id number]   CATARACT EXTRACTION W/PHACO Right 04/11/2017   Procedure: CATARACT EXTRACTION PHACO AND INTRAOCULAR LENS PLACEMENT (IOC);  Surgeon: Jaye Fallow, MD;  Location: [facility] ORS;  Service: Ophthalmology;  Laterality: Right;  US  01:09.5 AP% 15.8 CDE 11.04 Fluid Pack lot # [id number]   CHOLECYSTECTOMY     COLON SURGERY     BOWEL OBSTRUCTION   COLONOSCOPY WITH PROPOFOL  N/A 04/25/2016   Procedure: COLONOSCOPY WITH PROPOFOL ;  Surgeon: Ruel Kung, MD;  Location: [facility] ENDOSCOPY;  Service: Endoscopy;  Laterality: N/A;   ESOPHAGOGASTRODUODENOSCOPY (EGD) WITH PROPOFOL  N/A 04/25/2016   Procedure: ESOPHAGOGASTRODUODENOSCOPY (EGD) WITH PROPOFOL ;  Surgeon: Ruel Kung, MD;  Location: [facility] ENDOSCOPY;  Service: Endoscopy;  Laterality: N/A;   EXPLORATORY LAPAROTOMY  10/18/2011   blockage small intestines  GIVENS CAPSULE STUDY N/A 05/11/2016   Procedure: GIVENS CAPSULE STUDY;  Surgeon: Ruel Kung, MD;  Location: [facility] ENDOSCOPY;  Service: Endoscopy;  Laterality: N/A;    FAMILY HISTORY: Family History  Problem Relation Age of  Onset   Other Mother        unknown medical history   Heart disease Father    Breast cancer Sister 36   Heart attack Brother     ADVANCED DIRECTIVES (Y/N):  N  HEALTH MAINTENANCE: Social History   Tobacco Use   Smoking status: Former    Current packs/day: 0.00    Average packs/day: 2.0 packs/day for 35.0 years (70.0 ttl pk-yrs)    Types: Cigarettes    Start date: 68    Quit date: 46    Years since quitting: 35.9   Smokeless tobacco: Never  Vaping Use   Vaping status: Never Used  Substance Use Topics   Alcohol use: Not Currently    Comment: 1 beer /day or 2 occ, last use ~2023   Drug use: No     Colonoscopy:  PAP:  Bone density:  Lipid panel:  Allergies  Allergen Reactions   Aspirin Itching   Penicillins Other (See Comments)    Unknown reaction Has patient had a PCN reaction causing immediate rash, facial/tongue/throat swelling, SOB or lightheadedness with hypotension: Unknown Has patient had a PCN reaction causing severe rash involving mucus membranes or skin necrosis: Unknown Has patient had a PCN reaction that required hospitalization: Unknown Has patient had a PCN reaction occurring within the last 10 years: No If all of the above answers are NO, then may proceed with Cephalosporin use. Patient does not remember the reaction to penicillin.   Penicillin G     Unknown reaction Has patient had a PCN reaction causing immediate rash, facial/tongue/throat swelling, SOB or lightheadedness with hypotension: Unknown Has patient had a PCN reaction causing severe rash involving mucus membranes or skin necrosis: Unknown Has patient had a PCN reaction that required hospitalization: Unknown Has patient had a PCN reaction occurring within the last 10 years: No If all of the above answers are NO, then may proceed with Cephalosporin use.     Current Outpatient Medications  Medication Sig Dispense Refill   acetaminophen  (TYLENOL ) 325 MG tablet Take 650 mg by mouth  every 6 (six) hours as needed.     ALPRAZolam  (XANAX ) 1 MG tablet Take 0.5 mg (1/2 tablet) by mouth in the morning and 1 mg (1 tablet) by mouth at bedtime. 45 tablet 3   butalbital -acetaminophen -caffeine  (FIORICET ) 50-325-40 MG tablet Take 1-2 tablets by mouth every 6 (six) hours as needed for headache. 20 tablet 0   Cholecalciferol (VITAMIN D3) 50 MCG (2000 UT) CAPS Take 2,000 Units by mouth daily.     clopidogrel  (PLAVIX ) 75 MG tablet Take 1 tablet (75 mg total) by mouth daily. 90 tablet 4   cyanocobalamin  (V-R VITAMIN B-12) 500 MCG tablet Take 500 mcg by mouth daily.      ferrous gluconate  (FERGON) 240 (27 FE) MG tablet Take 240 mg by mouth 2 (two) times daily.     levothyroxine  (SYNTHROID ) 50 MCG tablet Take 1 tablet (50 mcg total) by mouth daily before breakfast. 90 tablet 4   metolazone (ZAROXOLYN) 2.5 MG tablet Take 2.5 mg by mouth once a week.     pantoprazole  (PROTONIX ) 20 MG tablet Take 1 tablet (20 mg total) by mouth daily. 90 tablet 3   pravastatin  (PRAVACHOL ) 20 MG tablet Take 1 tablet (20 mg  total) by mouth every evening. 90 tablet 4   pregabalin  (LYRICA ) 75 MG capsule Take 1 capsule (75 mg total) by mouth 3 (three) times daily. 90 capsule 3   torsemide  (DEMADEX ) 20 MG tablet Take 1 tablet (20 mg total) by mouth daily. 90 tablet 3   No current facility-administered medications for this visit.    OBJECTIVE: Vitals:   04/18/24 1145  BP: 121/68  Pulse: (!) 54  Resp: 16  Temp: 97.8 F (36.6 C)  SpO2: 99%      Body mass index is 28.12 kg/m.    ECOG FS:0 - Asymptomatic  General: Well-developed, well-nourished, no acute distress. Eyes: Pink conjunctiva, anicteric sclera. HEENT: Normocephalic, moist mucous membranes. Lungs: No audible wheezing or coughing. Heart: Regular rate and rhythm. Abdomen: Soft, nontender, no obvious distention. Musculoskeletal: No edema, cyanosis, or clubbing. Neuro: Alert, answering all questions appropriately. Cranial nerves grossly intact. Skin:  No rashes or petechiae noted. Psych: Normal affect.  LAB RESULTS:  Lab Results  Component Value Date   NA 139 03/04/2024   K 3.9 03/04/2024   CL 98 03/04/2024   CO2 24 03/04/2024   GLUCOSE 100 (H) 03/04/2024   BUN 36 (H) 03/04/2024   CREATININE 1.36 (H) 03/04/2024   CALCIUM 9.7 03/04/2024   PROT 5.9 (L) 02/03/2023   ALBUMIN 4.0 02/03/2023   AST 23 02/03/2023   ALT 10 02/03/2023   ALKPHOS 84 02/03/2023   BILITOT 0.2 02/03/2023   GFRNONAA 49 (L) 12/04/2022   GFRAA 52 (L) 10/13/2019    Lab Results  Component Value Date   WBC 8.6 04/17/2024   NEUTROABS 4.4 04/17/2024   HGB 13.6 04/17/2024   HCT 40.1 04/17/2024   MCV 92.6 04/17/2024   PLT 245 04/17/2024   Lab Results  Component Value Date   IRON  71 04/17/2024   TIBC 339 04/17/2024   IRONPCTSAT 21 04/17/2024   Lab Results  Component Value Date   FERRITIN 86 04/17/2024     STUDIES: ECHOCARDIOGRAM COMPLETE Result Date: 03/23/2024    ECHOCARDIOGRAM REPORT   Patient Name:   Tracie Fisher Date of Exam: 03/22/2024 Medical Rec #:  [id number]       Height:       60.0 in Accession #:    [id number]      Weight:       140.0 lb Date of Birth:  August 05, 1935        BSA:          1.604 m Patient Age:    88 years        BP:           130/84 mmHg Patient Gender: F               HR:           65 bpm. Exam Location:  Outpatient Procedure: 2D Echo (Both Spectral and Color Flow Doppler were utilized during            procedure). Indications:     pulmonary hypertension  History:         Patient has prior history of Echocardiogram examinations, most                  recent 03/22/2024. Chronic kidney disease; Risk                  Factors:Hypertension.  Sonographer:     Tinnie Barefoot RDCS Referring Phys:  [id number] CARMEN L GONZALEZ Diagnosing Phys: Dwayne D Callwood MD  IMPRESSIONS  1. Left ventricular ejection fraction, by estimation, is 60 to 65%. The left ventricle has normal function. The left ventricle has no regional wall motion abnormalities.  Left ventricular diastolic parameters are consistent with Grade I diastolic dysfunction (impaired relaxation).  2. Right ventricular systolic function is normal. The right ventricular size is normal. There is mildly elevated pulmonary artery systolic pressure.  3. The mitral valve is normal in structure. Trivial mitral valve regurgitation.  4. The aortic valve is normal in structure. Aortic valve regurgitation is not visualized. FINDINGS  Left Ventricle: Left ventricular ejection fraction, by estimation, is 60 to 65%. The left ventricle has normal function. The left ventricle has no regional wall motion abnormalities. Strain was performed and the global longitudinal strain is indeterminate. The left ventricular internal cavity size was normal in size. There is no left ventricular hypertrophy. Left ventricular diastolic parameters are consistent with Grade I diastolic dysfunction (impaired relaxation). Right Ventricle: The right ventricular size is normal. No increase in right ventricular wall thickness. Right ventricular systolic function is normal. There is mildly elevated pulmonary artery systolic pressure. The tricuspid regurgitant velocity is 3.20  m/s, and with an assumed right atrial pressure of 3 mmHg, the estimated right ventricular systolic pressure is 44.0 mmHg. Left Atrium: Left atrial size was normal in size. Right Atrium: Right atrial size was normal in size. Pericardium: There is no evidence of pericardial effusion. Mitral Valve: The mitral valve is normal in structure. Trivial mitral valve regurgitation. Tricuspid Valve: The tricuspid valve is normal in structure. Tricuspid valve regurgitation is mild. Aortic Valve: The aortic valve is normal in structure. Aortic valve regurgitation is not visualized. Aortic regurgitation PHT measures 552 msec. Pulmonic Valve: The pulmonic valve was normal in structure. Pulmonic valve regurgitation is trivial. Aorta: The ascending aorta was not well visualized.  IAS/Shunts: No atrial level shunt detected by color flow Doppler. Additional Comments: 3D was performed not requiring image post processing on an independent workstation and was indeterminate.  LEFT VENTRICLE PLAX 2D LVIDd:         4.50 cm   Diastology LVIDs:         3.00 cm   LV e' medial:    9.36 cm/s LV PW:         1.00 cm   LV E/e' medial:  8.8 LV IVS:        1.20 cm   LV e' lateral:   6.74 cm/s LVOT diam:     2.20 cm   LV E/e' lateral: 12.2 LV SV:         75 LV SV Index:   47 LVOT Area:     3.80 cm  RIGHT VENTRICLE             IVC RV Basal diam:  2.60 cm     IVC diam: 1.40 cm RV S prime:     11.90 cm/s TAPSE (M-mode): 1.7 cm LEFT ATRIUM             Index        RIGHT ATRIUM           Index LA diam:        3.40 cm 2.12 cm/m   RA Area:     14.10 cm LA Vol (A2C):   51.4 ml 32.04 ml/m  RA Volume:   35.20 ml  21.94 ml/m LA Vol (A4C):   74.5 ml 46.44 ml/m LA Biplane Vol: 67.3 ml 41.96 ml/m  AORTIC VALVE  PULMONIC VALVE LVOT Vmax:   82.30 cm/s  PR End Diast Vel: 6.15 msec LVOT Vmean:  55.500 cm/s LVOT VTI:    0.197 m AI PHT:      552 msec  AORTA Ao Root diam: 3.10 cm Ao Asc diam:  3.20 cm MITRAL VALVE                TRICUSPID VALVE MV Area (PHT): 3.31 cm     TR Peak grad:   41.0 mmHg MV Decel Time: 229 msec     TR Vmax:        320.00 cm/s MV E velocity: 82.30 cm/s MV A velocity: 108.00 cm/s  SHUNTS MV E/A ratio:  0.76         Systemic VTI:  0.20 m                             Systemic Diam: 2.20 cm Cara JONETTA Lovelace MD Electronically signed by Cara JONETTA Lovelace MD Signature Date/Time: 03/23/2024/8:51:49 PM    Final     ASSESSMENT: Iron  deficiency anemia.  PLAN:    Iron  deficiency anemia: Resolved.  Patient's hemoglobin and iron  stores continue to be within normal limits.  She does not require additional IV Venofer  today.  Patient last received treatment on March 23, 2023.  No intervention is needed at this time.  After discussion with the patient, was agreed upon that no further follow-up is  necessary.  Please refer patient back if there are any questions or concerns. Chronic renal insufficiency: Chronic and unchanged.  Patient's most recent GFR is 38.  Continue follow-up with nephrology as scheduled.   Hypertension: Patient's blood was within normal limits today.  Continue monitoring and treatment per primary care.     Patient expressed understanding and was in agreement with this plan. She also understands that She can call clinic at any time with any questions, concerns, or complaints.    Evalene JINNY Reusing, MD   04/18/2024 12:06 PM

## 2024-04-20 ENCOUNTER — Encounter: Payer: Self-pay | Admitting: Oncology

## 2024-05-18 NOTE — Patient Instructions (Incomplete)

## 2024-05-20 ENCOUNTER — Ambulatory Visit: Admitting: Nurse Practitioner

## 2024-05-20 ENCOUNTER — Encounter: Payer: Self-pay | Admitting: Nurse Practitioner

## 2024-05-20 VITALS — BP 126/68 | HR 62 | Temp 97.6°F | Resp 17 | Ht 60.0 in | Wt 147.4 lb

## 2024-05-20 DIAGNOSIS — F5101 Primary insomnia: Secondary | ICD-10-CM | POA: Diagnosis not present

## 2024-05-20 DIAGNOSIS — M25532 Pain in left wrist: Secondary | ICD-10-CM

## 2024-05-20 DIAGNOSIS — N184 Chronic kidney disease, stage 4 (severe): Secondary | ICD-10-CM | POA: Diagnosis not present

## 2024-05-20 DIAGNOSIS — E782 Mixed hyperlipidemia: Secondary | ICD-10-CM | POA: Diagnosis not present

## 2024-05-20 DIAGNOSIS — Z79899 Other long term (current) drug therapy: Secondary | ICD-10-CM

## 2024-05-20 DIAGNOSIS — M5481 Occipital neuralgia: Secondary | ICD-10-CM

## 2024-05-20 DIAGNOSIS — I272 Pulmonary hypertension, unspecified: Secondary | ICD-10-CM

## 2024-05-20 DIAGNOSIS — D631 Anemia in chronic kidney disease: Secondary | ICD-10-CM

## 2024-05-20 DIAGNOSIS — M85832 Other specified disorders of bone density and structure, left forearm: Secondary | ICD-10-CM

## 2024-05-20 DIAGNOSIS — I10 Essential (primary) hypertension: Secondary | ICD-10-CM | POA: Diagnosis not present

## 2024-05-20 DIAGNOSIS — I890 Lymphedema, not elsewhere classified: Secondary | ICD-10-CM | POA: Diagnosis not present

## 2024-05-20 DIAGNOSIS — E039 Hypothyroidism, unspecified: Secondary | ICD-10-CM

## 2024-05-20 MED ORDER — ALPRAZOLAM 1 MG PO TABS: ORAL_TABLET | ORAL | 3 refills | Status: AC

## 2024-05-20 NOTE — Assessment & Plan Note (Signed)
 Chronic, ongoing.  Followed by cardiology and pulmonary, continue current medication regimen as prescribed by them and collaboration.  Recent notes reviewed.

## 2024-05-20 NOTE — Assessment & Plan Note (Signed)
Chronic, ongoing.  Continue current medication regimen and adjust as needed based on labs.  TSH, Free T4 up to date. ?

## 2024-05-20 NOTE — Assessment & Plan Note (Signed)
 Chronic, ongoing, last DEXA 02/15/2017.  Has not gone for repeat DEXA as instructed.  Recommend Vitamin D3 2000 units daily and adequate calcium intake.  Labs today.

## 2024-05-20 NOTE — Assessment & Plan Note (Addendum)
 Chronic, stable. Followed by hematology and nephrology, continue this collaboration.  Recent notes reviewed.

## 2024-05-20 NOTE — Assessment & Plan Note (Signed)
 Chronic, ongoing for years.  Previous PCP, Dr. Arlana Pouch, placed her on Xanax many years ago and she still takes, reports she cannot sleep without it.  Refuses to try anything else. At this time will continue current regimen and adjust if possible.  Aware of need for every 3 month visits. Controlled substance agreement at next visit and UDS up to date (due next 08/22/24).  Return in 3 months.

## 2024-05-20 NOTE — Assessment & Plan Note (Signed)
 Chronic and much improving with pumps.  No edema today and fluid weight loss present.  Continue collaboration with vascular and using pumps daily.  Recent vascular note reviewed.

## 2024-05-20 NOTE — Assessment & Plan Note (Signed)
 Chronic, ongoing.  Continue current medication regimen and adjust as needed. Lipid panel today.

## 2024-05-20 NOTE — Assessment & Plan Note (Signed)
 Refer to primary insomnia plan of care.

## 2024-05-20 NOTE — Progress Notes (Signed)
 "  BP 126/68 (BP Location: Left Arm, Patient Position: Sitting, Cuff Size: Normal)   Pulse 62   Temp 97.6 F (36.4 C) (Oral)   Resp 17   Ht 5' (1.524 m)   Wt 147 lb 6.4 oz (66.9 kg)   SpO2 96%   BMI 28.79 kg/m    Subjective:    Patient ID: Tracie Fisher, female    DOB: [birth year], 89 y.o.   MRN: [id number]  HPI: Tracie Fisher is a 89 y.o. female  Chief Complaint  Patient presents with   Follow-up    Here for a check up   HYPERTENSION / HYPERLIPIDEMIA/ PULMONARY HTN Continues to take Plavix , Zaroxolyn, Pravastatin , Torsemide . Last visit with cardiology was on 05/01/23. Had echo in November 2025 noting EF 60-65%. Pulmonary was seen on 03/12/24 with no changes. Satisfied with current treatment? yes Duration of hypertension: chronic BP monitoring frequency: not checking BP range:  BP medication side effects: no Duration of hyperlipidemia: chronic Cholesterol medication side effects: no Cholesterol supplements: none Medication compliance: good compliance Aspirin: no Recent stressors: no Recurrent headaches: no Visual changes: no Palpitations: no Dyspnea: no Chest pain: no Lower extremity edema: occasional Dizzy/lightheaded: no   CHRONIC KIDNEY DISEASE (Stage 4) Last visit with nephrology was on 04/17/24 with no changes. Follows with hematology for anemia with CKD, last visit was on 04/18/24. No iron  infusions since 03/23/23. Wears lymphedema pumps at home for occasional edema. CKD status: stable Medications renally dose: yes Previous renal evaluation: no Pneumovax:  Up to Date Influenza Vaccine:  Up to Date     Latest Ref Rng & Units 03/04/2024    1:55 PM 02/03/2023    2:05 PM 01/02/2023    1:58 PM  BMP  Glucose 70 - 99 mg/dL 899  86  72   BUN 8 - 27 mg/dL 36  23  25   Creatinine 0.57 - 1.00 mg/dL 8.63  8.79  8.79   BUN/Creat Ratio 12 - 28 26  19  21    Sodium 134 - 144 mmol/L 139  142  140   Potassium 3.5 - 5.2 mmol/L 3.9  3.8  3.9   Chloride 96 - 106 mmol/L 98   102  103   CO2 20 - 29 mmol/L 24  24  21    Calcium 8.7 - 10.3 mg/dL 9.7  9.1  9.3     HYPOTHYROIDISM Thyroid  control status:stable Satisfied with current treatment? yes Medication side effects: no Medication compliance: good compliance Etiology of hypothyroidism:  Recent dose adjustment:no Fatigue: no Cold intolerance: no Heat intolerance: no Weight gain: no Weight loss: no Constipation: no Diarrhea/loose stools: no Palpitations: no Lower extremity edema: occasional Anxiety/depressed mood: no   OSTEOPENIA No recent falls or fractures. She continues to follow with neurology due to occipital neuralgia. Last visit was on 05/01/24 during which she was sent to ortho for wrist pain. She would like to return to Dr. Alverta for her left wrist pain. Satisfied with current treatment?: yes Adequate calcium & vitamin D : yes Weight bearing exercises: yes   INSOMNIA Continues to take Xanax , has been on for many years. Dr. Corlis, her previous PCP, started many years ago. Wishes to remain on it -- takes 1/2 tablet Xanax  in morning and 1 tablet at night. Pt is aware of risks of psychoactive medication use to include increased sedation, respiratory suppression, falls, extrapyramidal movements, dependence and cardiovascular events.  Pt would like to continue treatment as benefit determined to outweigh risk.  Last filled  on 04/22/24. Duration: chronic Satisfied with sleep quality: yes Difficulty falling asleep: yes Difficulty staying asleep: yes Waking a few hours after sleep onset: no Early morning awakenings: yes Daytime hypersomnolence: no Wakes feeling refreshed: yes Good sleep hygiene: yes Apnea: no Snoring: no Depressed/anxious mood: yes Recent stress: no Restless legs/nocturnal leg cramps: no Chronic pain/arthritis: yes  Relevant past medical, surgical, family and social history reviewed and updated as indicated. Interim medical history since our last visit reviewed. Allergies and  medications reviewed and updated.  Review of Systems  Constitutional:  Negative for activity change, appetite change, diaphoresis, fatigue and fever.  Respiratory:  Negative for cough, chest tightness, shortness of breath and wheezing.   Cardiovascular:  Positive for leg swelling (occasional). Negative for chest pain and palpitations.  Gastrointestinal: Negative.   Neurological:  Positive for headaches (occasional). Negative for dizziness, syncope, weakness, light-headedness and numbness.  Psychiatric/Behavioral: Negative.      Per HPI unless specifically indicated above     Objective:    BP 126/68 (BP Location: Left Arm, Patient Position: Sitting, Cuff Size: Normal)   Pulse 62   Temp 97.6 F (36.4 C) (Oral)   Resp 17   Ht 5' (1.524 m)   Wt 147 lb 6.4 oz (66.9 kg)   SpO2 96%   BMI 28.79 kg/m   Wt Readings from Last 3 Encounters:  05/20/24 147 lb 6.4 oz (66.9 kg)  04/18/24 144 lb (65.3 kg)  03/12/24 140 lb (63.5 kg)    Physical Exam Vitals and nursing note reviewed.  Constitutional:      General: She is awake. She is not in acute distress.    Appearance: She is well-developed and well-groomed. She is not ill-appearing or toxic-appearing.  HENT:     Head: Normocephalic.     Right Ear: Hearing and external ear normal.     Left Ear: Hearing and external ear normal.     Nose: Nose normal.     Mouth/Throat:     Mouth: Mucous membranes are moist.  Eyes:     General: Lids are normal.        Right eye: No discharge.        Left eye: No discharge.     Conjunctiva/sclera: Conjunctivae normal.     Pupils: Pupils are equal, round, and reactive to light.  Neck:     Thyroid : No thyromegaly.     Vascular: No carotid bruit.  Cardiovascular:     Rate and Rhythm: Normal rate and regular rhythm.     Heart sounds: Normal heart sounds. No murmur heard.    No gallop.  Pulmonary:     Effort: Pulmonary effort is normal. No accessory muscle usage or respiratory distress.     Breath  sounds: Normal breath sounds. No decreased breath sounds, wheezing or rales.     Comments: No SOB with talking. Abdominal:     General: Bowel sounds are normal. There is no distension.     Palpations: Abdomen is soft.     Tenderness: There is no abdominal tenderness.  Musculoskeletal:     Cervical back: Normal range of motion and neck supple.     Right lower leg: No edema.     Left lower leg: No edema.  Lymphadenopathy:     Cervical: No cervical adenopathy.  Skin:    General: Skin is warm and dry.     Findings: Bruising present.     Comments: To bilateral upper extremities  Neurological:     Mental Status:  She is alert and oriented to person, place, and time.     Deep Tendon Reflexes: Reflexes are normal and symmetric.     Reflex Scores:      Brachioradialis reflexes are 2+ on the right side and 2+ on the left side.      Patellar reflexes are 2+ on the right side and 2+ on the left side. Psychiatric:        Attention and Perception: Attention normal.        Mood and Affect: Mood normal.        Speech: Speech normal.        Behavior: Behavior normal. Behavior is cooperative.        Thought Content: Thought content normal.    Results for orders placed or performed in visit on 04/17/24  Iron  and TIBC([facility])   Collection Time: 04/17/24 11:00 AM  Result Value Ref Range   Iron  71 28 - 170 ug/dL   TIBC 660 749 - 549 ug/dL   Saturation Ratios 21 10.4 - 31.8 %   UIBC 268 ug/dL  Ferritin   Collection Time: 04/17/24 11:00 AM  Result Value Ref Range   Ferritin 86 11 - 307 ng/mL  CBC with Differential/Platelet   Collection Time: 04/17/24 11:00 AM  Result Value Ref Range   WBC 8.6 4.0 - 10.5 K/uL   RBC 4.33 3.87 - 5.11 MIL/uL   Hemoglobin 13.6 12.0 - 15.0 g/dL   HCT 59.8 63.9 - 53.9 %   MCV 92.6 80.0 - 100.0 fL   MCH 31.4 26.0 - 34.0 pg   MCHC 33.9 30.0 - 36.0 g/dL   RDW 87.9 88.4 - 84.4 %   Platelets 245 150 - 400 K/uL   nRBC 0.0 0.0 - 0.2 %   Neutrophils Relative  % 51 %   Neutro Abs 4.4 1.7 - 7.7 K/uL   Lymphocytes Relative 35 %   Lymphs Abs 3.0 0.7 - 4.0 K/uL   Monocytes Relative 10 %   Monocytes Absolute 0.9 0.1 - 1.0 K/uL   Eosinophils Relative 3 %   Eosinophils Absolute 0.3 0.0 - 0.5 K/uL   Basophils Relative 1 %   Basophils Absolute 0.1 0.0 - 0.1 K/uL   Immature Granulocytes 0 %   Abs Immature Granulocytes 0.03 0.00 - 0.07 K/uL      Assessment & Plan:   Problem List Items Addressed This Visit       Cardiovascular and Mediastinum   Moderate pulmonary hypertension (HCC) - Primary   Chronic, ongoing.  Followed by cardiology and pulmonary, continue current medication regimen as prescribed by them and collaboration.  Recent notes reviewed.      Essential hypertension   Chronic, stable.  BP at goal for her age in office.  Recommend she monitor BP at least a few mornings a week at home and document.  DASH diet at home.  Continue Torsemide  only.  Collaboration with cardiology - recent note reviewed.  Labs today: up to date with nephrology.  Urine ALB < 20 May 2023.         Endocrine   Hypothyroidism, adult   Chronic, ongoing.  Continue current medication regimen and adjust as needed based on labs.  TSH, Free T4 up to date.        Nervous and Auditory   Occipital neuralgia   Chronic, ongoing.  Continues to be followed by neurology.  Will continue this collaboration and medication regimen as ordered by them.  Recent note reviewed. Educated her  on Tylenol  and her max dose daily, which includes dose in Fioricet  if taken.      Relevant Medications   ALPRAZolam  (XANAX ) 1 MG tablet     Musculoskeletal and Integument   Osteopenia of left forearm   Chronic, ongoing, last DEXA 02/15/2017.  Has not gone for repeat DEXA as instructed.  Recommend Vitamin D3 2000 units daily and adequate calcium intake.  Labs today.      Relevant Orders   VITAMIN D  25 Hydroxy (Vit-D Deficiency, Fractures)     Genitourinary   CKD (chronic kidney disease)  stage 4, GFR 15-29 ml/min (HCC)   Chronic, ongoing.  Followed by nephrology.  Currently CKD 4.  Educated her on diet and fluid intake + current medications. Urine ALB <20 May 2023.  Recommend continue all current medications and wear compression hose at home on during day and off at night.  Labs up to date with nephrology.        Other   Mixed hyperlipidemia   Chronic, ongoing.  Continue current medication regimen and adjust as needed.  Lipid panel today.      Relevant Orders   Lipid Panel w/o Chol/HDL Ratio   Lymphedema   Chronic and much improving with pumps.  No edema today and fluid weight loss present.  Continue collaboration with vascular and using pumps daily.  Recent vascular note reviewed.      Long-term current use of benzodiazepine   Refer to primary insomnia plan of care.      Insomnia   Chronic, ongoing for years.  Previous PCP, Dr. Corlis, placed her on Xanax  many years ago and she still takes, reports she cannot sleep without it.  Refuses to try anything else. At this time will continue current regimen and adjust if possible.  Aware of need for every 3 month visits. Controlled substance agreement at next visit and UDS up to date (due next 08/22/24).  Return in 3 months.      Relevant Medications   ALPRAZolam  (XANAX ) 1 MG tablet   Anemia due to stage 4 chronic kidney disease (HCC)   Chronic, stable. Followed by hematology and nephrology, continue this collaboration.  Recent notes reviewed.      Other Visit Diagnoses       Left wrist pain       Referral to ortho per patient request.   Relevant Orders   Ambulatory referral to Orthopedic Surgery        Follow up plan: Return in about 3 months (around 08/18/2024) for HTN/HLD, Hypothyroid, CKD, Insomnia.      "

## 2024-05-20 NOTE — Assessment & Plan Note (Signed)
 Chronic, stable.  BP at goal for her age in office.  Recommend she monitor BP at least a few mornings a week at home and document.  DASH diet at home.  Continue Torsemide  only.  Collaboration with cardiology - recent note reviewed.  Labs today: up to date with nephrology.  Urine ALB < 20 May 2023.

## 2024-05-20 NOTE — Assessment & Plan Note (Signed)
 Chronic, ongoing.  Continues to be followed by neurology.  Will continue this collaboration and medication regimen as ordered by them.  Recent note reviewed. Educated her on Tylenol  and her max dose daily, which includes dose in Fioricet  if taken.

## 2024-05-20 NOTE — Assessment & Plan Note (Signed)
 Chronic, ongoing.  Followed by nephrology.  Currently CKD 4.  Educated her on diet and fluid intake + current medications. Urine ALB <20 May 2023.  Recommend continue all current medications and wear compression hose at home on during day and off at night.  Labs up to date with nephrology.

## 2024-05-21 ENCOUNTER — Ambulatory Visit: Payer: Self-pay | Admitting: Nurse Practitioner

## 2024-05-21 ENCOUNTER — Encounter

## 2024-05-21 ENCOUNTER — Ambulatory Visit: Admitting: Pulmonary Disease

## 2024-05-21 LAB — LIPID PANEL W/O CHOL/HDL RATIO
Cholesterol, Total: 202 mg/dL — ABNORMAL HIGH (ref 100–199)
HDL: 80 mg/dL
LDL Chol Calc (NIH): 94 mg/dL (ref 0–99)
Triglycerides: 165 mg/dL — ABNORMAL HIGH (ref 0–149)
VLDL Cholesterol Cal: 28 mg/dL (ref 5–40)

## 2024-05-21 LAB — VITAMIN D 25 HYDROXY (VIT D DEFICIENCY, FRACTURES): Vit D, 25-Hydroxy: 50.3 ng/mL (ref 30.0–100.0)

## 2024-05-21 NOTE — Progress Notes (Signed)
 Please let Kalasia know lipid panel remains stable. No medication changes needed. Have a wonderful day.

## 2024-05-24 ENCOUNTER — Telehealth: Payer: Self-pay

## 2024-05-24 NOTE — Telephone Encounter (Signed)
 Request received for need for PCP referral for services

## 2024-06-06 ENCOUNTER — Encounter

## 2024-06-06 ENCOUNTER — Ambulatory Visit: Admitting: Pulmonary Disease

## 2024-06-06 ENCOUNTER — Ambulatory Visit

## 2024-06-06 ENCOUNTER — Encounter: Payer: Self-pay | Admitting: Pulmonary Disease

## 2024-06-06 VITALS — BP 110/60 | HR 61 | Temp 97.7°F | Ht 60.0 in | Wt 147.6 lb

## 2024-06-06 DIAGNOSIS — Z87891 Personal history of nicotine dependence: Secondary | ICD-10-CM

## 2024-06-06 DIAGNOSIS — R053 Chronic cough: Secondary | ICD-10-CM | POA: Diagnosis not present

## 2024-06-06 DIAGNOSIS — R0602 Shortness of breath: Secondary | ICD-10-CM | POA: Diagnosis not present

## 2024-06-06 DIAGNOSIS — J4489 Other specified chronic obstructive pulmonary disease: Secondary | ICD-10-CM

## 2024-06-06 LAB — PULMONARY FUNCTION TEST
DL/VA % pred: 85 %
DL/VA: 3.62 ml/min/mmHg/L
DLCO cor % pred: 62 %
DLCO cor: 9.88 ml/min/mmHg
DLCO unc % pred: 62 %
DLCO unc: 9.94 ml/min/mmHg
FEF 25-75 Post: 0.59 L/s
FEF 25-75 Pre: 0.66 L/s
FEF2575-%Change-Post: -10 %
FEF2575-%Pred-Post: 67 %
FEF2575-%Pred-Pre: 75 %
FEV1-%Change-Post: -3 %
FEV1-%Pred-Post: 75 %
FEV1-%Pred-Pre: 77 %
FEV1-Post: 1.02 L
FEV1-Pre: 1.05 L
FEV1FVC-%Change-Post: -4 %
FEV1FVC-%Pred-Pre: 99 %
FEV6-%Change-Post: 0 %
FEV6-%Pred-Post: 83 %
FEV6-%Pred-Pre: 83 %
FEV6-Post: 1.46 L
FEV6-Pre: 1.44 L
FEV6FVC-%Change-Post: 0 %
FEV6FVC-%Pred-Post: 106 %
FEV6FVC-%Pred-Pre: 107 %
FVC-%Change-Post: 1 %
FVC-%Pred-Post: 78 %
FVC-%Pred-Pre: 77 %
FVC-Post: 1.47 L
FVC-Pre: 1.45 L
Post FEV1/FVC ratio: 69 %
Post FEV6/FVC ratio: 99 %
Pre FEV1/FVC ratio: 72 %
Pre FEV6/FVC Ratio: 100 %

## 2024-06-06 MED ORDER — FLUTICASONE FUROATE-VILANTEROL 100-25 MCG/ACT IN AEPB: 1.0000 | INHALATION_SPRAY | Freq: Every day | RESPIRATORY_TRACT | 6 refills | Status: AC

## 2024-06-06 NOTE — Progress Notes (Deleted)
 "  Subjective:    Patient ID: Tracie Fisher, female    DOB: [birth year], 89 y.o.   MRN: [id number]  Patient Care Team: [name], NP as PCP - General (Nurse Practitioner) Jacobo Evalene PARAS, MD as Consulting Physician (Oncology) Dennise Capri, MD as Consulting Physician (Nephrology) Florencio Cara BIRCH, MD as Consulting Physician (Cardiology) Jaye Fallow, MD as Referring Physician (Ophthalmology) Jama, Cordella MATSU, MD as Consulting Physician (Vascular Surgery) Lane Arthea BRAVO, MD as Referring Physician (Neurology) Maree Jannett POUR, MD as Consulting Physician (Neurology)  Chief Complaint  Patient presents with   Medical Management of Chronic Issues    Nor breathing problems. Fatigue with any activity.     BACKGROUND:   HPI    Review of Systems A 10 point review of systems was performed and it is as noted above otherwise negative.   Past Medical History:  Diagnosis Date   Cancer (HCC)    SKIN   Complication of anesthesia    Depression    Dysrhythmia    GERD (gastroesophageal reflux disease)    Hiatal hernia    HOH (hard of hearing)    Hypertension    Hypothyroidism    PONV (postoperative nausea and vomiting)    Thyroid  disease     Past Surgical History:  Procedure Laterality Date   APPENDECTOMY     BACK SURGERY     MULTIPLE/ROD IN BACK   BREAST CYST ASPIRATION Left years ago   CATARACT EXTRACTION W/PHACO Left 03/21/2017   Procedure: CATARACT EXTRACTION PHACO AND INTRAOCULAR LENS PLACEMENT (IOC);  Surgeon: Jaye Fallow, MD;  Location: [facility];  Service: Ophthalmology;  Laterality: Left;  US  00:48 AP% 17.8 CDE 8.57 Fluid pack lot # [id number]   CATARACT EXTRACTION W/PHACO Right 04/11/2017   Procedure: CATARACT EXTRACTION PHACO AND INTRAOCULAR LENS PLACEMENT (IOC);  Surgeon: Jaye Fallow, MD;  Location: [facility];  Service: Ophthalmology;  Laterality: Right;  US  01:09.5 AP% 15.8 CDE 11.04 Fluid Pack lot # [id number]   CHOLECYSTECTOMY      COLON SURGERY     BOWEL OBSTRUCTION   COLONOSCOPY WITH PROPOFOL  N/A 04/25/2016   Procedure: COLONOSCOPY WITH PROPOFOL ;  Surgeon: Ruel Kung, MD;  Location: [facility];  Service: Endoscopy;  Laterality: N/A;   ESOPHAGOGASTRODUODENOSCOPY (EGD) WITH PROPOFOL  N/A 04/25/2016   Procedure: ESOPHAGOGASTRODUODENOSCOPY (EGD) WITH PROPOFOL ;  Surgeon: Ruel Kung, MD;  Location: [facility];  Service: Endoscopy;  Laterality: N/A;   EXPLORATORY LAPAROTOMY  10/18/2011   blockage small intestines   GIVENS CAPSULE STUDY N/A 05/11/2016   Procedure: GIVENS CAPSULE STUDY;  Surgeon: Ruel Kung, MD;  Location: [facility];  Service: Endoscopy;  Laterality: N/A;    Patient Active Problem List   Diagnosis Date Noted   Carotid stenosis 10/22/2023   Long-term current use of benzodiazepine 08/23/2023   Lymphedema 03/29/2023   Anemia due to stage 4 chronic kidney disease (HCC) 02/04/2023   Overweight (BMI 25.0-29.9) 08/05/2022   Vision loss 08/04/2022   Senile purpura 06/16/2022   Insomnia 06/16/2022   Varicose veins of both lower extremities without ulcer or inflammation 06/16/2022   Mixed hyperlipidemia 06/12/2022   Occipital neuralgia 06/12/2022   Sensory ataxia 06/12/2022   Osteopenia of left forearm 06/12/2022   CMC arthritis 05/23/2022   CKD (chronic kidney disease) stage 4, GFR 15-29 ml/min (HCC) 03/03/2022   Hypothyroidism, adult 03/01/2022   Loss of memory 07/26/2021   Moderate pulmonary hypertension (HCC) 05/20/2021   Primary osteoarthritis involving multiple joints 03/24/2021   Unsteady gait 10/16/2019  History of stroke 03/04/2019   Bilateral foot pain 03/06/2018   Chronic bilateral low back pain 03/06/2018   Neck pain 03/06/2018   Neuropathy 02/06/2018   History of nonmelanoma skin cancer 09/27/2016   Essential hypertension 09/02/2016   Hiatal hernia    Stricture of esophagus    Iron  deficiency anemia, unspecified 03/23/2016   History of tachycardia 07/29/2015    Family  History  Problem Relation Age of Onset   Other Mother        unknown medical history   Heart disease Father    Breast cancer Sister 31   Heart attack Brother     Social History   Tobacco Use   Smoking status: Former    Current packs/day: 0.00    Average packs/day: 2.0 packs/day for 35.0 years (70.0 ttl pk-yrs)    Types: Cigarettes    Start date: 66    Quit date: 43    Years since quitting: 36.0   Smokeless tobacco: Never  Substance Use Topics   Alcohol use: Not Currently    Comment: 1 beer /day or 2 occ, last use ~2023    Allergies[1]  Active Medications[2]  Immunization History  Administered Date(s) Administered   Fluad Trivalent(High Dose 65+) 02/03/2023   PFIZER Comirnaty(Gray Top)Covid-19 Tri-Sucrose Vaccine 10/02/2020   PFIZER(Purple Top)SARS-COV-2 Vaccination 06/06/2019, 06/27/2019, 02/17/2020   Pfizer Covid-19 Vaccine Bivalent Booster 32yrs & up 07/20/2021   Pfizer(Comirnaty)Fall Seasonal Vaccine 12 years and older 02/17/2023, 03/04/2024   Pneumococcal Conjugate-13 04/07/2015   Td 10/26/2022   Tdap 05/07/2015   Zoster, Live 03/28/2013        Objective:     Vitals:   06/06/24 1327  BP: 110/60  Pulse: 61  Temp: 97.7 F (36.5 C)  Height: 5' (1.524 m)  Weight: 147 lb 9.6 oz (67 kg)  SpO2: 95%  TempSrc: Temporal  BMI (Calculated): 28.83     SpO2: 95 %  GENERAL: HEAD: Normocephalic, atraumatic.  EYES: Pupils equal, round, reactive to light.  No scleral icterus.  MOUTH:  NECK: Supple. No thyromegaly. Trachea midline. No JVD.  No adenopathy. PULMONARY: Good air entry bilaterally.  No adventitious sounds. CARDIOVASCULAR: S1 and S2. Regular rate and rhythm.  ABDOMEN: MUSCULOSKELETAL: No joint deformity, no clubbing, no edema.  NEUROLOGIC:  SKIN: Intact,warm,dry. PSYCH:        Assessment & Plan:   No diagnosis found.  No orders of the defined types were placed in this encounter.   No orders of the defined types were placed in this  encounter.    Advised if symptoms do not improve or worsen, to please contact office for sooner follow up or seek emergency care.    I spent xxx minutes of dedicated to the care of this patient on the date of this encounter to include pre-visit review of records, face-to-face time with the patient discussing conditions above, post visit ordering of testing, clinical documentation with the electronic health record, making appropriate referrals as documented, and communicating necessary findings to members of the patients care team.   C. Leita Sanders, MD Advanced Bronchoscopy PCCM [facility]    *This note was dictated using voice recognition software/Dragon.  Despite best efforts to proofread, errors can occur which can change the meaning. Any transcriptional errors that result from this process are unintentional and may not be fully corrected at the time of dictation.    [1]  Allergies Allergen Reactions   Aspirin Itching   Penicillins Other (See Comments)    Unknown reaction  Has patient had a PCN reaction causing immediate rash, facial/tongue/throat swelling, SOB or lightheadedness with hypotension: Unknown Has patient had a PCN reaction causing severe rash involving mucus membranes or skin necrosis: Unknown Has patient had a PCN reaction that required hospitalization: Unknown Has patient had a PCN reaction occurring within the last 10 years: No If all of the above answers are NO, then may proceed with Cephalosporin use. Patient does not remember the reaction to penicillin.   Penicillin G     Unknown reaction Has patient had a PCN reaction causing immediate rash, facial/tongue/throat swelling, SOB or lightheadedness with hypotension: Unknown Has patient had a PCN reaction causing severe rash involving mucus membranes or skin necrosis: Unknown Has patient had a PCN reaction that required hospitalization: Unknown Has patient had a PCN reaction occurring within  the last 10 years: No If all of the above answers are NO, then may proceed with Cephalosporin use.   [2]  Current Meds  Medication Sig   ALPRAZolam  (XANAX ) 1 MG tablet Take 0.5 mg (1/2 tablet) by mouth in the morning and 1 mg (1 tablet) by mouth at bedtime.   butalbital -acetaminophen -caffeine  (FIORICET ) 50-325-40 MG tablet Take 1-2 tablets by mouth every 6 (six) hours as needed for headache.   Cholecalciferol (VITAMIN D3) 50 MCG (2000 UT) CAPS Take 2,000 Units by mouth daily.   clopidogrel  (PLAVIX ) 75 MG tablet Take 1 tablet (75 mg total) by mouth daily.   cyanocobalamin  (V-R VITAMIN B-12) 500 MCG tablet Take 500 mcg by mouth daily.    ferrous gluconate  (FERGON) 240 (27 FE) MG tablet Take 240 mg by mouth 2 (two) times daily.   levothyroxine  (SYNTHROID ) 50 MCG tablet Take 1 tablet (50 mcg total) by mouth daily before breakfast.   metolazone (ZAROXOLYN) 2.5 MG tablet Take 2.5 mg by mouth once a week.   pantoprazole  (PROTONIX ) 20 MG tablet Take 1 tablet (20 mg total) by mouth daily.   pravastatin  (PRAVACHOL ) 20 MG tablet Take 1 tablet (20 mg total) by mouth every evening.   pregabalin  (LYRICA ) 75 MG capsule Take 1 capsule (75 mg total) by mouth 3 (three) times daily.   torsemide  (DEMADEX ) 20 MG tablet Take 1 tablet (20 mg total) by mouth daily.   "

## 2024-06-06 NOTE — Patient Instructions (Signed)
 VISIT SUMMARY:  During your visit, we discussed your chronic cough and respiratory symptoms. We reviewed your recent cardiac and pulmonary function tests, and addressed your asthma and post-nasal drip.  YOUR PLAN:  -CHRONIC ASTHMA: Chronic asthma is a long-term condition that affects your airways, causing them to become inflamed and narrow, which can lead to coughing and difficulty breathing. Your pulmonary function tests indicate mild asthma. We have prescribed a daily inhaler to help manage your asthma symptoms. Please continue using your nasal spray for post-nasal drip, and remember to rinse your mouth after using the inhaler. If your inhaler is not covered by insurance, please let us  know so we can discuss alternative options.

## 2024-06-06 NOTE — Progress Notes (Signed)
 Full PFT completed today ? ?

## 2024-06-06 NOTE — Patient Instructions (Signed)
 Full PFT completed today ? ?

## 2024-06-06 NOTE — Progress Notes (Unsigned)
 "  Subjective:    Patient ID: Tracie Fisher, female    DOB: January 13, 1936, 89 y.o.   MRN: [id number]  Patient Care Team: [name], NP as PCP - General (Nurse Practitioner) Jacobo Evalene PARAS, MD as Consulting Physician (Oncology) Dennise Capri, MD as Consulting Physician (Nephrology) Florencio Cara BIRCH, MD as Consulting Physician (Cardiology) Jaye Fallow, MD as Referring Physician (Ophthalmology) Jama, Cordella MATSU, MD as Consulting Physician (Vascular Surgery) Lane Arthea BRAVO, MD as Referring Physician (Neurology) Maree Jannett POUR, MD as Consulting Physician (Neurology)  Chief Complaint  Patient presents with   Medical Management of Chronic Issues    Nor breathing problems. Fatigue with any activity.     BACKGROUND/INTERVAL:  HPI   Review of Systems A 10 point review of systems was performed and it is as noted above otherwise negative.   Patient Active Problem List   Diagnosis Date Noted   Carotid stenosis 10/22/2023   Long-term current use of benzodiazepine 08/23/2023   Lymphedema 03/29/2023   Anemia due to stage 4 chronic kidney disease (HCC) 02/04/2023   Overweight (BMI 25.0-29.9) 08/05/2022   Vision loss 08/04/2022   Senile purpura 06/16/2022   Insomnia 06/16/2022   Varicose veins of both lower extremities without ulcer or inflammation 06/16/2022   Mixed hyperlipidemia 06/12/2022   Occipital neuralgia 06/12/2022   Sensory ataxia 06/12/2022   Osteopenia of left forearm 06/12/2022   CMC arthritis 05/23/2022   CKD (chronic kidney disease) stage 4, GFR 15-29 ml/min (HCC) 03/03/2022   Hypothyroidism, adult 03/01/2022   Loss of memory 07/26/2021   Moderate pulmonary hypertension (HCC) 05/20/2021   Primary osteoarthritis involving multiple joints 03/24/2021   Unsteady gait 10/16/2019   History of stroke 03/04/2019   Bilateral foot pain 03/06/2018   Chronic bilateral low back pain 03/06/2018   Neck pain 03/06/2018   Neuropathy  02/06/2018   History of nonmelanoma skin cancer 09/27/2016   Essential hypertension 09/02/2016   Hiatal hernia    Stricture of esophagus    Iron  deficiency anemia, unspecified 03/23/2016   History of tachycardia 07/29/2015    Social History   Tobacco Use   Smoking status: Former    Current packs/day: 0.00    Average packs/day: 2.0 packs/day for 35.0 years (70.0 ttl pk-yrs)    Types: Cigarettes    Start date: 69    Quit date: 51    Years since quitting: 36.0   Smokeless tobacco: Never  Substance Use Topics   Alcohol use: Not Currently    Comment: 1 beer /day or 2 occ, last use ~2023    Allergies[1]  Active Medications[2]  Immunization History  Administered Date(s) Administered   Fluad Trivalent(High Dose 65+) 02/03/2023   PFIZER Comirnaty(Gray Top)Covid-19 Tri-Sucrose Vaccine 10/02/2020   PFIZER(Purple Top)SARS-COV-2 Vaccination 06/06/2019, 06/27/2019, 02/17/2020   Pfizer Covid-19 Vaccine Bivalent Booster 35yrs & up 07/20/2021   Pfizer(Comirnaty)Fall Seasonal Vaccine 12 years and older 02/17/2023, 03/04/2024   Pneumococcal Conjugate-13 04/07/2015   Td 10/26/2022   Tdap 05/07/2015   Zoster, Live 03/28/2013        Objective:     Vitals:   06/06/24 1327  BP: 110/60  Pulse: 61  Temp: 97.7 F (36.5 C)  Height: 5' (1.524 m)  Weight: 147 lb 9.6 oz (67 kg)  SpO2: 95%  TempSrc: Temporal  BMI (Calculated): 28.83     SpO2: 95 %  GENERAL: HEAD: Normocephalic, atraumatic.  EYES: Pupils equal, round, reactive to light.  No scleral icterus.  MOUTH:  NECK: Supple. No thyromegaly.  Trachea midline. No JVD.  No adenopathy. PULMONARY: Good air entry bilaterally.  No adventitious sounds. CARDIOVASCULAR: S1 and S2. Regular rate and rhythm.  ABDOMEN: MUSCULOSKELETAL: No joint deformity, no clubbing, no edema.  NEUROLOGIC:  SKIN: Intact,warm,dry. PSYCH:        Assessment & Plan:   No diagnosis found.  No orders of the defined types were  placed in this encounter.   No orders of the defined types were placed in this encounter.     Advised if symptoms do not improve or worsen, to please contact office for sooner follow up or seek emergency care.    I spent xxx minutes of dedicated to the care of this patient on the date of this encounter to include pre-visit review of records, face-to-face time with the patient discussing conditions above, post visit ordering of testing, clinical documentation with the electronic health record, making appropriate referrals as documented, and communicating necessary findings to members of the patients care team.     C. Leita Sanders, MD Advanced [facility]ch[facility]    *This note was generated using voice recognition software/Dragon and/or AI transcription program.  Despite best efforts to proofread, errors can occur which can change the meaning. Any transcriptional errors that result from this process are unintentional and may not be fully corrected at the time of dictation.     [1] Allergies Allergen Reactions   Aspirin Itching   Penicillins Other (See Comments)    Unknown reaction Has patient had a PCN reaction causing immediate rash, facial/tongue/throat swelling, SOB or lightheadedness with hypotension: Unknown Has patient had a PCN reaction causing severe rash involving mucus membranes or skin necrosis: Unknown Has patient had a PCN reaction that required hospitalization: Unknown Has patient had a PCN reaction occurring within the last 10 years: No If all of the above answers are NO, then may proceed with Cephalosporin use. Patient does not remember the reaction to penicillin.   Penicillin G     Unknown reaction Has patient had a PCN reaction causing immediate rash, facial/tongue/throat swelling, SOB or lightheadedness with hypotension: Unknown Has patient had a PCN reaction causing severe rash involving mucus membranes or skin necrosis:  Unknown Has patient had a PCN reaction that required hospitalization: Unknown Has patient had a PCN reaction occurring within the last 10 years: No If all of the above answers are NO, then may proceed with Cephalosporin use.   [2] Current Meds  Medication Sig   ALPRAZolam  (XANAX ) 1 MG tablet Take 0.5 mg (1/2 tablet) by mouth in the morning and 1 mg (1 tablet) by mouth at bedtime.   butalbital -acetaminophen -caffeine  (FIORICET ) 50-325-40 MG tablet Take 1-2 tablets by mouth every 6 (six) hours as needed for headache.   Cholecalciferol (VITAMIN D3) 50 MCG (2000 UT) CAPS Take 2,000 Units by mouth daily.   clopidogrel  (PLAVIX ) 75 MG tablet Take 1 tablet (75 mg total) by mouth daily.   cyanocobalamin  (V-R VITAMIN B-12) 500 MCG tablet Take 500 mcg by mouth daily.    ferrous gluconate  (FERGON) 240 (27 FE) MG tablet Take 240 mg by mouth 2 (two) times daily.   levothyroxine  (SYNTHROID ) 50 MCG tablet Take 1 tablet (50 mcg total) by mouth daily before breakfast.   metolazone (ZAROXOLYN) 2.5 MG tablet Take 2.5 mg by mouth once a week.   pantoprazole  (PROTONIX ) 20 MG tablet Take 1 tablet (20 mg total) by mouth daily.   pravastatin  (PRAVACHOL ) 20 MG tablet Take 1 tablet (20 mg total) by mouth every evening.  pregabalin  (LYRICA ) 75 MG capsule Take 1 capsule (75 mg total) by mouth 3 (three) times daily.   torsemide  (DEMADEX ) 20 MG tablet Take 1 tablet (20 mg total) by mouth daily.  "

## 2024-06-07 ENCOUNTER — Encounter: Payer: Self-pay | Admitting: Pulmonary Disease

## 2024-06-21 ENCOUNTER — Other Ambulatory Visit: Payer: Self-pay | Admitting: Nurse Practitioner

## 2024-08-21 ENCOUNTER — Ambulatory Visit: Admitting: Nurse Practitioner

## 2024-09-10 ENCOUNTER — Ambulatory Visit: Admitting: Pulmonary Disease

## 2024-09-24 ENCOUNTER — Ambulatory Visit

## 2024-10-14 ENCOUNTER — Ambulatory Visit (INDEPENDENT_AMBULATORY_CARE_PROVIDER_SITE_OTHER): Admitting: Vascular Surgery
